# Patient Record
Sex: Male | Born: 1940
Health system: Southern US, Community
[De-identification: ages and names within clinical notes are randomized; demographics above are authoritative.]

## PROBLEM LIST (undated history)

## (undated) DIAGNOSIS — E079 Disorder of thyroid, unspecified: Secondary | ICD-10-CM

## (undated) DIAGNOSIS — K219 Gastro-esophageal reflux disease without esophagitis: Secondary | ICD-10-CM

## (undated) DIAGNOSIS — E538 Deficiency of other specified B group vitamins: Secondary | ICD-10-CM

## (undated) DIAGNOSIS — E7800 Pure hypercholesterolemia, unspecified: Secondary | ICD-10-CM

## (undated) DIAGNOSIS — E119 Type 2 diabetes mellitus without complications: Secondary | ICD-10-CM

## (undated) DIAGNOSIS — J45909 Unspecified asthma, uncomplicated: Secondary | ICD-10-CM

## (undated) DIAGNOSIS — N400 Enlarged prostate without lower urinary tract symptoms: Secondary | ICD-10-CM

## (undated) DIAGNOSIS — C61 Malignant neoplasm of prostate: Secondary | ICD-10-CM

## (undated) DIAGNOSIS — K922 Gastrointestinal hemorrhage, unspecified: Secondary | ICD-10-CM

## (undated) DIAGNOSIS — I4891 Unspecified atrial fibrillation: Secondary | ICD-10-CM

## (undated) DIAGNOSIS — K7460 Unspecified cirrhosis of liver: Secondary | ICD-10-CM

## (undated) DIAGNOSIS — Z87442 Personal history of urinary calculi: Secondary | ICD-10-CM

## (undated) DIAGNOSIS — M549 Dorsalgia, unspecified: Secondary | ICD-10-CM

## (undated) DIAGNOSIS — Z87448 Personal history of other diseases of urinary system: Secondary | ICD-10-CM

## (undated) DIAGNOSIS — N200 Calculus of kidney: Secondary | ICD-10-CM

## (undated) DIAGNOSIS — K259 Gastric ulcer, unspecified as acute or chronic, without hemorrhage or perforation: Secondary | ICD-10-CM

## (undated) DIAGNOSIS — M1990 Unspecified osteoarthritis, unspecified site: Secondary | ICD-10-CM

## (undated) DIAGNOSIS — I10 Essential (primary) hypertension: Secondary | ICD-10-CM

## (undated) HISTORY — PX: HERNIA REPAIR: SHX51

## (undated) HISTORY — DX: Calculus of kidney: N20.0

## (undated) HISTORY — DX: Benign prostatic hyperplasia without lower urinary tract symptoms: N40.0

## (undated) HISTORY — DX: Malignant neoplasm of prostate: C61

## (undated) HISTORY — DX: Gastric ulcer, unspecified as acute or chronic, without hemorrhage or perforation: K25.9

## (undated) HISTORY — DX: Gastro-esophageal reflux disease without esophagitis: K21.9

## (undated) HISTORY — DX: Dorsalgia, unspecified: M54.9

## (undated) HISTORY — DX: Personal history of other diseases of urinary system: Z87.448

## (undated) HISTORY — DX: Essential (primary) hypertension: I10

## (undated) HISTORY — DX: Pure hypercholesterolemia, unspecified: E78.00

## (undated) HISTORY — PX: OTHER SURGICAL HISTORY: SHX169

## (undated) HISTORY — DX: Unspecified cirrhosis of liver: K74.60

## (undated) HISTORY — DX: Type 2 diabetes mellitus without complications: E11.9

## (undated) HISTORY — DX: Disorder of thyroid, unspecified: E07.9

## (undated) HISTORY — DX: Unspecified osteoarthritis, unspecified site: M19.90

## (undated) HISTORY — PX: CIRCUMCISION: SUR203

---

## 1991-03-22 HISTORY — PX: OTHER SURGICAL HISTORY: SHX169

## 2004-01-15 ENCOUNTER — Ambulatory Visit: Admission: RE | Admit: 2004-01-15 | Discharge: 2004-04-14 | Payer: Self-pay | Admitting: Radiation Oncology

## 2004-05-06 ENCOUNTER — Ambulatory Visit: Payer: Self-pay | Admitting: Cardiology

## 2004-05-12 ENCOUNTER — Ambulatory Visit: Payer: Self-pay | Admitting: Cardiology

## 2004-06-09 ENCOUNTER — Ambulatory Visit: Payer: Self-pay | Admitting: Cardiology

## 2004-06-10 ENCOUNTER — Inpatient Hospital Stay (HOSPITAL_BASED_OUTPATIENT_CLINIC_OR_DEPARTMENT_OTHER): Admission: RE | Admit: 2004-06-10 | Discharge: 2004-06-10 | Payer: Self-pay | Admitting: Cardiology

## 2004-07-05 ENCOUNTER — Ambulatory Visit: Payer: Self-pay | Admitting: Cardiology

## 2004-07-06 ENCOUNTER — Ambulatory Visit: Payer: Self-pay | Admitting: Cardiology

## 2010-08-20 DIAGNOSIS — E7800 Pure hypercholesterolemia, unspecified: Secondary | ICD-10-CM | POA: Insufficient documentation

## 2010-08-20 DIAGNOSIS — E079 Disorder of thyroid, unspecified: Secondary | ICD-10-CM | POA: Insufficient documentation

## 2010-08-20 DIAGNOSIS — E119 Type 2 diabetes mellitus without complications: Secondary | ICD-10-CM | POA: Insufficient documentation

## 2010-08-20 DIAGNOSIS — IMO0002 Reserved for concepts with insufficient information to code with codable children: Secondary | ICD-10-CM | POA: Insufficient documentation

## 2010-08-20 DIAGNOSIS — E669 Obesity, unspecified: Secondary | ICD-10-CM | POA: Insufficient documentation

## 2012-01-17 DIAGNOSIS — C61 Malignant neoplasm of prostate: Secondary | ICD-10-CM | POA: Insufficient documentation

## 2012-01-17 DIAGNOSIS — N480 Leukoplakia of penis: Secondary | ICD-10-CM | POA: Insufficient documentation

## 2012-01-17 DIAGNOSIS — Z23 Encounter for immunization: Secondary | ICD-10-CM | POA: Insufficient documentation

## 2013-01-08 DIAGNOSIS — E119 Type 2 diabetes mellitus without complications: Secondary | ICD-10-CM

## 2013-02-01 DIAGNOSIS — J438 Other emphysema: Secondary | ICD-10-CM | POA: Insufficient documentation

## 2013-02-01 DIAGNOSIS — C61 Malignant neoplasm of prostate: Secondary | ICD-10-CM | POA: Insufficient documentation

## 2013-10-04 DIAGNOSIS — I10 Essential (primary) hypertension: Secondary | ICD-10-CM | POA: Diagnosis present

## 2013-10-04 DIAGNOSIS — E039 Hypothyroidism, unspecified: Secondary | ICD-10-CM | POA: Insufficient documentation

## 2013-10-04 DIAGNOSIS — E7800 Pure hypercholesterolemia, unspecified: Secondary | ICD-10-CM | POA: Insufficient documentation

## 2014-04-11 DIAGNOSIS — J449 Chronic obstructive pulmonary disease, unspecified: Secondary | ICD-10-CM | POA: Diagnosis not present

## 2014-04-23 DIAGNOSIS — N281 Cyst of kidney, acquired: Secondary | ICD-10-CM | POA: Diagnosis not present

## 2014-04-23 DIAGNOSIS — K439 Ventral hernia without obstruction or gangrene: Secondary | ICD-10-CM | POA: Diagnosis not present

## 2014-04-23 DIAGNOSIS — N200 Calculus of kidney: Secondary | ICD-10-CM | POA: Diagnosis not present

## 2014-04-23 DIAGNOSIS — R351 Nocturia: Secondary | ICD-10-CM | POA: Diagnosis not present

## 2014-04-23 DIAGNOSIS — R350 Frequency of micturition: Secondary | ICD-10-CM | POA: Diagnosis not present

## 2014-04-24 DIAGNOSIS — N3000 Acute cystitis without hematuria: Secondary | ICD-10-CM | POA: Insufficient documentation

## 2014-04-28 DIAGNOSIS — E780 Pure hypercholesterolemia: Secondary | ICD-10-CM | POA: Diagnosis not present

## 2014-04-28 DIAGNOSIS — E1165 Type 2 diabetes mellitus with hyperglycemia: Secondary | ICD-10-CM | POA: Diagnosis not present

## 2014-04-28 DIAGNOSIS — I10 Essential (primary) hypertension: Secondary | ICD-10-CM | POA: Diagnosis not present

## 2014-04-28 DIAGNOSIS — N3001 Acute cystitis with hematuria: Secondary | ICD-10-CM | POA: Diagnosis not present

## 2014-04-28 DIAGNOSIS — E039 Hypothyroidism, unspecified: Secondary | ICD-10-CM | POA: Diagnosis not present

## 2014-05-05 DIAGNOSIS — E039 Hypothyroidism, unspecified: Secondary | ICD-10-CM | POA: Diagnosis not present

## 2014-05-05 DIAGNOSIS — E1165 Type 2 diabetes mellitus with hyperglycemia: Secondary | ICD-10-CM | POA: Diagnosis not present

## 2014-05-05 DIAGNOSIS — I10 Essential (primary) hypertension: Secondary | ICD-10-CM | POA: Diagnosis not present

## 2014-05-05 DIAGNOSIS — Z1389 Encounter for screening for other disorder: Secondary | ICD-10-CM | POA: Diagnosis not present

## 2014-05-05 DIAGNOSIS — E780 Pure hypercholesterolemia: Secondary | ICD-10-CM | POA: Diagnosis not present

## 2014-05-12 ENCOUNTER — Other Ambulatory Visit (INDEPENDENT_AMBULATORY_CARE_PROVIDER_SITE_OTHER): Payer: Self-pay | Admitting: General Surgery

## 2014-05-12 DIAGNOSIS — K432 Incisional hernia without obstruction or gangrene: Secondary | ICD-10-CM | POA: Diagnosis not present

## 2014-05-12 DIAGNOSIS — K7460 Unspecified cirrhosis of liver: Secondary | ICD-10-CM

## 2014-05-12 DIAGNOSIS — J449 Chronic obstructive pulmonary disease, unspecified: Secondary | ICD-10-CM | POA: Diagnosis not present

## 2014-05-12 DIAGNOSIS — N135 Crossing vessel and stricture of ureter without hydronephrosis: Secondary | ICD-10-CM | POA: Diagnosis not present

## 2014-05-13 NOTE — Addendum Note (Signed)
Addended by: Stark Klein on: 05/13/2014 07:35 AM   Modules accepted: Orders

## 2014-05-16 ENCOUNTER — Ambulatory Visit (INDEPENDENT_AMBULATORY_CARE_PROVIDER_SITE_OTHER): Payer: Medicare Other | Admitting: Urology

## 2014-05-16 DIAGNOSIS — C61 Malignant neoplasm of prostate: Secondary | ICD-10-CM

## 2014-05-16 DIAGNOSIS — N471 Phimosis: Secondary | ICD-10-CM | POA: Diagnosis not present

## 2014-05-16 DIAGNOSIS — R350 Frequency of micturition: Secondary | ICD-10-CM | POA: Diagnosis not present

## 2014-05-16 DIAGNOSIS — N480 Leukoplakia of penis: Secondary | ICD-10-CM | POA: Diagnosis not present

## 2014-05-26 ENCOUNTER — Encounter (INDEPENDENT_AMBULATORY_CARE_PROVIDER_SITE_OTHER): Payer: Self-pay | Admitting: Internal Medicine

## 2014-05-26 ENCOUNTER — Ambulatory Visit (INDEPENDENT_AMBULATORY_CARE_PROVIDER_SITE_OTHER): Payer: Medicare Other | Admitting: Internal Medicine

## 2014-05-26 VITALS — BP 130/68 | HR 76 | Temp 99.0°F | Resp 18 | Ht 70.0 in | Wt 234.9 lb

## 2014-05-26 DIAGNOSIS — K7460 Unspecified cirrhosis of liver: Secondary | ICD-10-CM | POA: Diagnosis not present

## 2014-05-26 LAB — CBC
HEMATOCRIT: 29.8 % — AB (ref 39.0–52.0)
Hemoglobin: 9.3 g/dL — ABNORMAL LOW (ref 13.0–17.0)
MCH: 23.4 pg — ABNORMAL LOW (ref 26.0–34.0)
MCHC: 31.2 g/dL (ref 30.0–36.0)
MCV: 74.9 fL — ABNORMAL LOW (ref 78.0–100.0)
MPV: 10.7 fL (ref 8.6–12.4)
Platelets: 152 10*3/uL (ref 150–400)
RBC: 3.98 MIL/uL — ABNORMAL LOW (ref 4.22–5.81)
RDW: 16.4 % — ABNORMAL HIGH (ref 11.5–15.5)
WBC: 4 10*3/uL (ref 4.0–10.5)

## 2014-05-26 NOTE — Patient Instructions (Signed)
Physician will call with results of blood tests when completed. Upper endoscopy to be scheduled

## 2014-05-26 NOTE — Progress Notes (Signed)
Reason for consultation;  For evaluation of new diagnosis of cirrhosis.  History of present illness;  Patient is 74 year old Caucasian male who was referred through courtesy of Dr. Stark Klein for further evaluation of new diagnosis of cirrhosis. Patient is accompanied by his wife Inez Catalina. Patient's present illness started about 10 weeks ago when he started to experience right flank pain radiating posteriorly and also into his right lower extremity. He has history of urolithiasis. He therefore thought pain was due to recurrent kidney stones. He was seen by his urologist Dr. Coralee Rud and underwent abdominopelvic CT without contrast. This study revealed irregular contour to liver suspicious for cirrhosis, splenomegaly, anterior abdominal collaterals, multiple small ventral herniae containing fat as well as thickening and infiltrative process involving retroperitoneal fat but there was no evidence of urolithiasis or hydronephrosis. Patient was therefore referred to Dr. Barry Dienes for treatment of ventral herniae. Patient states right flank pain has nearly resolved and now intermittent and very mild. He is scheduled for MRI of abdomen to be performed in one week. Because of CT findings suggestive of cirrhosis she recommended further evaluation. Patient denies history of hepatitis or jaundice. There is no history of elevated transaminases in the past. Family history is negative for chronic liver disease. He has not had any alcohol in 20 years and before that he would drink occasionally. He complains of sporadic postprandial epigastric pain which resolved spontaneously after a few minutes. He states he had blood transfusion in 1991 when he presented with upper GI bleed and underwent surgery for bleeding ulcers. He denies nausea vomiting or heartburn. He has occasional difficulty swallowing water. Bowels move daily. He denies melena or rectal bleeding. He has never undergone screening colonoscopy but stool  guaiac by Dr. Quintin Alto is always negative. He denies dysuria or hematuria. He states his appetite is good and he is not sure why he has lost 24 pounds in one year. He is not able to do much physical activity on account of COPD. He uses O2 at night.   Current Medications: Outpatient Encounter Prescriptions as of 05/26/2014  Medication Sig  . aspirin EC 81 MG tablet Take 81 mg by mouth daily.  Marland Kitchen glipiZIDE (GLUCOTROL) 10 MG tablet Take 10 mg by mouth 2 (two) times daily before a meal.  . levothyroxine (SYNTHROID, LEVOTHROID) 150 MCG tablet Take 150 mcg by mouth daily before breakfast.  . lisinopril (PRINIVIL,ZESTRIL) 20 MG tablet Take 20 mg by mouth daily.  . metFORMIN (GLUCOPHAGE) 850 MG tablet Take 850 mg by mouth 3 (three) times daily with meals.  . simvastatin (ZOCOR) 40 MG tablet Take 20 mg by mouth at bedtime.   Past medical history;  He has been diabetic for 20 years. Hypertension of 15 years duration. History of kidney stones and he has passed three in the past. COPD. He is using O2 at night. Hyperlipidemia. Hypothyroidism diagnosed about 5 years ago. History of prostate carcinoma in 2011 treated with radiation therapy. Upper GI bleed secondary to peptic ulcer disease in 1991 requiring surgery. Fracture to right elbow and he underwent surgery 20 years ago. Circumcision 7 years ago.  Allergies; Allergies  Allergen Reactions  . Penicillins Itching    Patient states that it also caused pain in his sides.   Family history;  Father died of MI at age 67. Mother died of emphysema at age 34. He has 3 brothers and they're all diabetic. Of the 2 sisters living one is diabetic. There are sister died at age 56; no details  available.  Social history;  Patient is married(second marriage; first marriage ended in divorce). He has 3 grownup children in good health and 10 grandchildren. 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 He smokes cigarettes about a pack a day  for 20 years but quit in 1991. He used to drink alcohol occasionally but hasn't had any in 20 years.   Physical examination;  Blood pressure 130/68, pulse 76, temperature 99 F (37.2 C), temperature source Oral, resp. rate 18, height 5' 10"  (1.778 m), weight 234 lb 14.4 oz (106.55 kg). Patient is alert and in no acute distress. Conjunctiva is pink. Sclera is nonicteric Oropharyngeal mucosa is normal. He only has two teeth and lower jaw. No neck masses or thyromegaly noted. Cardiac exam with regular rhythm normal S1 and S2. No murmur or gallop noted. Lungs are clear to auscultation. Abdomen is folded upper midline scar and focal protrusion to the right of midline scar which is somewhat firm with positive cough impulse and minimally tender. Rest of the abdomen is soft without hepatosplenomegaly.  No LE edema or clubbing noted.  Labs/studies Results:  Lab data from Dr. Edythe Lynn office from 04/28/2014 TSH 0.665 Hemoglobin A1c is 9.3. Glucose 273, serum sodium 140, potassium 4.2, chloride 103, CO2 24, BUN 16 and creatinine 0.92 Serum calcium 9.0 Bilirubin 0.8, AP 118, AST 21, ALT 18, total protein 6.9 and albumin 4.2. Recent CBC is not available.  Abdominopelvic CT was reviewed with Dr. Lavonia Dana. Findings as above.    Assessment:  #1. Cirrhosis. Etiology is suspected to be NAFLD given long-standing diabetes mellitus. Other risks factors include remote blood transfusion. He appears to have compensated hepatic function. He needs to be screened for varices and HCC. Glycemic control appears to be suboptimal and he needs to work on on his diet. #2. Thickening to the retroperitoneal fat felt to be source of his right flank pain. This abnormality is to be further evaluated with MR abdomen. #3. Ventral herniae. One is easily palpable on exam and appears to contain omentum based on CT and has narrow neck. Dr. Barry Dienes has evaluated patient and has noted that he is high risk for surgery. #4.  Patient is average risk for CRC. He is not interested in screening colonoscopy but will think about it.  Recommendations;  Patient advised not to eat raw fish. Patient will go to the lab for CBC, INR, AFP, HbSAg, HCV antibody, serum iron, TIBC and serum ferritin. We will also check ANA and SMA. We will review MR abdomen to be done in one week. Will schedule patient for esophagogastroduodenoscopy after MR completed.  Office visit in 4 months.

## 2014-05-27 DIAGNOSIS — I10 Essential (primary) hypertension: Secondary | ICD-10-CM | POA: Insufficient documentation

## 2014-05-27 DIAGNOSIS — K7581 Nonalcoholic steatohepatitis (NASH): Secondary | ICD-10-CM | POA: Insufficient documentation

## 2014-05-27 DIAGNOSIS — E785 Hyperlipidemia, unspecified: Secondary | ICD-10-CM | POA: Insufficient documentation

## 2014-05-27 DIAGNOSIS — J449 Chronic obstructive pulmonary disease, unspecified: Secondary | ICD-10-CM | POA: Insufficient documentation

## 2014-05-27 DIAGNOSIS — E119 Type 2 diabetes mellitus without complications: Secondary | ICD-10-CM | POA: Insufficient documentation

## 2014-05-27 DIAGNOSIS — K7460 Unspecified cirrhosis of liver: Secondary | ICD-10-CM | POA: Insufficient documentation

## 2014-05-27 LAB — IRON AND TIBC
%SAT: 4 % — ABNORMAL LOW (ref 20–55)
Iron: 23 ug/dL — ABNORMAL LOW (ref 42–165)
TIBC: 527 ug/dL — AB (ref 215–435)
UIBC: 504 ug/dL — ABNORMAL HIGH (ref 125–400)

## 2014-05-27 LAB — HEPATITIS C ANTIBODY: HCV Ab: NEGATIVE

## 2014-05-27 LAB — PROTIME-INR
INR: 1.15 (ref <1.50)
PROTHROMBIN TIME: 14.7 s (ref 11.6–15.2)

## 2014-05-27 LAB — AFP TUMOR MARKER: AFP-Tumor Marker: 1.2 ng/mL (ref <6.1)

## 2014-05-27 LAB — ANTI-SMOOTH MUSCLE ANTIBODY, IGG: Smooth Muscle Ab: 8 U (ref <20)

## 2014-05-27 LAB — ANA: Anti Nuclear Antibody(ANA): NEGATIVE

## 2014-05-27 LAB — HEPATITIS B SURFACE ANTIGEN: Hepatitis B Surface Ag: NEGATIVE

## 2014-05-27 LAB — FERRITIN: Ferritin: 10 ng/mL — ABNORMAL LOW (ref 22–322)

## 2014-05-28 ENCOUNTER — Other Ambulatory Visit (INDEPENDENT_AMBULATORY_CARE_PROVIDER_SITE_OTHER): Payer: Self-pay | Admitting: *Deleted

## 2014-05-28 DIAGNOSIS — K7460 Unspecified cirrhosis of liver: Secondary | ICD-10-CM

## 2014-05-28 DIAGNOSIS — K7469 Other cirrhosis of liver: Secondary | ICD-10-CM

## 2014-06-02 ENCOUNTER — Ambulatory Visit
Admission: RE | Admit: 2014-06-02 | Discharge: 2014-06-02 | Disposition: A | Discharge status: Home / Self Care | Payer: Medicare Other | Source: Ambulatory Visit | Attending: General Surgery | Admitting: General Surgery

## 2014-06-02 DIAGNOSIS — R188 Other ascites: Secondary | ICD-10-CM | POA: Diagnosis not present

## 2014-06-02 DIAGNOSIS — Z8546 Personal history of malignant neoplasm of prostate: Secondary | ICD-10-CM | POA: Diagnosis not present

## 2014-06-02 MED ORDER — GADOXETATE DISODIUM 0.25 MMOL/ML IV SOLN
9.0000 mL | Freq: Once | INTRAVENOUS | Status: AC | PRN
  Administered 2014-06-02: 9 mL via INTRAVENOUS

## 2014-06-03 ENCOUNTER — Encounter (HOSPITAL_COMMUNITY)
Admission: RE | Disposition: A | Discharge status: Home / Self Care | Payer: Self-pay | Source: Ambulatory Visit | Attending: Internal Medicine

## 2014-06-03 ENCOUNTER — Ambulatory Visit (HOSPITAL_COMMUNITY)
Admission: RE | Admit: 2014-06-03 | Discharge: 2014-06-03 | Disposition: A | Discharge status: Home / Self Care | Payer: Medicare Other | Source: Ambulatory Visit | Attending: Internal Medicine | Admitting: Internal Medicine

## 2014-06-03 DIAGNOSIS — E119 Type 2 diabetes mellitus without complications: Secondary | ICD-10-CM | POA: Diagnosis not present

## 2014-06-03 DIAGNOSIS — E780 Pure hypercholesterolemia: Secondary | ICD-10-CM | POA: Insufficient documentation

## 2014-06-03 DIAGNOSIS — Z87891 Personal history of nicotine dependence: Secondary | ICD-10-CM | POA: Diagnosis not present

## 2014-06-03 DIAGNOSIS — Z980 Intestinal bypass and anastomosis status: Secondary | ICD-10-CM | POA: Insufficient documentation

## 2014-06-03 DIAGNOSIS — E079 Disorder of thyroid, unspecified: Secondary | ICD-10-CM | POA: Insufficient documentation

## 2014-06-03 DIAGNOSIS — Z87442 Personal history of urinary calculi: Secondary | ICD-10-CM | POA: Diagnosis not present

## 2014-06-03 DIAGNOSIS — I8500 Esophageal varices without bleeding: Secondary | ICD-10-CM | POA: Insufficient documentation

## 2014-06-03 DIAGNOSIS — Z880 Allergy status to penicillin: Secondary | ICD-10-CM | POA: Insufficient documentation

## 2014-06-03 DIAGNOSIS — K7460 Unspecified cirrhosis of liver: Secondary | ICD-10-CM | POA: Diagnosis not present

## 2014-06-03 DIAGNOSIS — Z8546 Personal history of malignant neoplasm of prostate: Secondary | ICD-10-CM | POA: Insufficient documentation

## 2014-06-03 DIAGNOSIS — M1990 Unspecified osteoarthritis, unspecified site: Secondary | ICD-10-CM | POA: Insufficient documentation

## 2014-06-03 DIAGNOSIS — K2950 Unspecified chronic gastritis without bleeding: Secondary | ICD-10-CM | POA: Diagnosis not present

## 2014-06-03 DIAGNOSIS — K7469 Other cirrhosis of liver: Secondary | ICD-10-CM | POA: Diagnosis not present

## 2014-06-03 DIAGNOSIS — K219 Gastro-esophageal reflux disease without esophagitis: Secondary | ICD-10-CM | POA: Insufficient documentation

## 2014-06-03 DIAGNOSIS — K3189 Other diseases of stomach and duodenum: Secondary | ICD-10-CM | POA: Diagnosis not present

## 2014-06-03 DIAGNOSIS — K766 Portal hypertension: Secondary | ICD-10-CM | POA: Insufficient documentation

## 2014-06-03 DIAGNOSIS — Z7982 Long term (current) use of aspirin: Secondary | ICD-10-CM | POA: Diagnosis not present

## 2014-06-03 DIAGNOSIS — D509 Iron deficiency anemia, unspecified: Secondary | ICD-10-CM | POA: Insufficient documentation

## 2014-06-03 DIAGNOSIS — K6389 Other specified diseases of intestine: Secondary | ICD-10-CM | POA: Diagnosis not present

## 2014-06-03 HISTORY — PX: ESOPHAGOGASTRODUODENOSCOPY: SHX5428

## 2014-06-03 LAB — GLUCOSE, CAPILLARY: GLUCOSE-CAPILLARY: 187 mg/dL — AB (ref 70–99)

## 2014-06-03 SURGERY — EGD (ESOPHAGOGASTRODUODENOSCOPY)
Anesthesia: Moderate Sedation

## 2014-06-03 MED ORDER — MEPERIDINE HCL 50 MG/ML IJ SOLN
INTRAMUSCULAR | Status: AC
  Filled 2014-06-03: qty 1

## 2014-06-03 MED ORDER — MIDAZOLAM HCL 5 MG/5ML IJ SOLN
INTRAMUSCULAR | Status: AC
  Filled 2014-06-03: qty 10

## 2014-06-03 MED ORDER — SIMETHICONE 40 MG/0.6ML PO SUSP
ORAL | Status: DC | PRN
  Administered 2014-06-03: 08:00:00

## 2014-06-03 MED ORDER — SODIUM CHLORIDE 0.9 % IV SOLN
INTRAVENOUS | Status: DC
  Administered 2014-06-03: 1000 mL via INTRAVENOUS

## 2014-06-03 MED ORDER — MIDAZOLAM HCL 5 MG/5ML IJ SOLN
INTRAMUSCULAR | Status: DC | PRN
  Administered 2014-06-03 (×3): 1 mg via INTRAVENOUS

## 2014-06-03 MED ORDER — BUTAMBEN-TETRACAINE-BENZOCAINE 2-2-14 % EX AERO
INHALATION_SPRAY | CUTANEOUS | Status: DC | PRN
  Administered 2014-06-03: 2 via TOPICAL

## 2014-06-03 MED ORDER — MEPERIDINE HCL 50 MG/ML IJ SOLN
INTRAMUSCULAR | Status: DC | PRN
  Administered 2014-06-03 (×2): 25 mg via INTRAVENOUS

## 2014-06-03 NOTE — Discharge Instructions (Signed)
Resume aspirin on 06/05/2014. Resume other medications as before. Resume usual diet. Physician will call with results of blood test(ACE level) and biopsy. No driving for 24 hours.  Esophagogastroduodenoscopy Care After Refer to this sheet in the next few weeks. These instructions provide you with information on caring for yourself after your procedure. Your caregiver may also give you more specific instructions. Your treatment has been planned according to current medical practices, but problems sometimes occur. Call your caregiver if you have any problems or questions after your procedure.  HOME CARE INSTRUCTIONS  Do not eat or drink anything until the numbing medicine (local anesthetic) has worn off and your gag reflex has returned. You will know that the local anesthetic has worn off when you can swallow comfortably.  Do not drive for 12 hours after the procedure or as directed by your caregiver.  Only take medicines as directed by your caregiver. SEEK MEDICAL CARE IF:   You cannot stop coughing.  You are not urinating at all or less than usual. SEEK IMMEDIATE MEDICAL CARE IF:  You have difficulty swallowing.  You cannot eat or drink.  You have worsening throat or chest pain.  You have dizziness, lightheadedness, or you faint.  You have nausea or vomiting.  You have chills.  You have a fever.  You have severe abdominal pain.  You have black, tarry, or bloody stools. Document Released: 02/22/2012 Document Reviewed: 02/22/2012 [facility] Patient Information 2015 [place]. This information is not intended to replace advice given to you by your health care provider. Make sure you discuss any questions you have with your health care provider.

## 2014-06-03 NOTE — H&P (Signed)
Tyler Farmer is an 74 y.o. male.   Chief Complaint: Patient is here for EGD. HPI: Patient is 74 year old Caucasian male who was recently evaluated for right-sided abdominal pain and found to have ventral hernia as well as irregular contorted to the liver suspicious for cirrhosis splenomegaly abdominal collaterals he also had thickening to retroperitoneal fat with few lymph nodes. Patient was therefore referred for further evaluation of cirrhosis. Biochemical markers were negative and he was found to have iron deficiency anemia. Erosive felt to be secondary to NAFLD given long-standing diabetes. He had MR abdomen yesterday revealing dominant retroperitoneal lymph nodes as well as infiltration of the mesenteric fat raising possibility of retroperitoneal fibrosis lymphoma and other conditions. Patient is undergoing EGD looking for source of 5 and deficiency anemia and also for varices. She has declined to undergo colonoscopy. There is no history of melena or rectal bleeding.   Past Medical History  Diagnosis Date  . Arthritis     Hands/Fingers  . Back pain   . H/O bladder problems   . Diabetes mellitus   . Enlarged prostate   . Gastric ulcer   . GERD (gastroesophageal reflux disease)   . Hypertension   . Hypercholesteremia   . Kidney stone   . Prostate cancer   . Thyroid disease     Past Surgical History  Procedure Laterality Date  . Gastric ulcer  1993  . Right elbow Right     A pin was put in  . Circumcision      at age 47    Family History  Problem Relation Age of Onset  . Emphysema Mother   . Cerebrovascular Accident Father   . Heart disease Father   . Diabetes Sister   . Heart disease Brother   . Heart disease Sister   . Heart disease Brother   . Diabetes Brother    Social History:  reports that he quit smoking about 25 years ago. He has never used smokeless tobacco. He reports that he does not drink alcohol. His drug history is not on file.  Allergies:   Allergies  Allergen Reactions  . Penicillins Itching    Patient states that it also caused pain in his sides.    Medications Prior to Admission  Medication Sig Dispense Refill  . aspirin EC 81 MG tablet Take 81 mg by mouth daily.    . ferrous sulfate 325 (65 FE) MG tablet Take 325 mg by mouth 2 (two) times daily with a meal.    . glipiZIDE (GLUCOTROL) 10 MG tablet Take 10 mg by mouth 2 (two) times daily before a meal.    . levothyroxine (SYNTHROID, LEVOTHROID) 150 MCG tablet Take 150 mcg by mouth daily before breakfast.    . lisinopril (PRINIVIL,ZESTRIL) 20 MG tablet Take 20 mg by mouth daily.    . metFORMIN (GLUCOPHAGE) 850 MG tablet Take 850 mg by mouth 3 (three) times daily with meals.    . simvastatin (ZOCOR) 40 MG tablet Take 20 mg by mouth at bedtime.      Results for orders placed or performed during the hospital encounter of 06/03/14 (from the past 48 hour(s))  Glucose, capillary     Status: Abnormal   Collection Time: 06/03/14  7:19 AM  Result Value Ref Range   Glucose-Capillary 187 (H) 70 - 99 mg/dL   [name]  06/02/2014   CLINICAL DATA:  Right-sided abdominal pain for 10 weeks. History of prostate cancer. Cirrhosis.  EXAM: MRI  ABDOMEN WITHOUT AND WITH CONTRAST  TECHNIQUE: Multiplanar multisequence MR imaging of the abdomen was performed both before and after the administration of intravenous contrast.  CONTRAST:  9 cc Eovist IV contrast.  BUN and creatinine were obtained on site at [facility] at [address] Results: BUN 9 mg/dL, Creatinine 0.8 mg/dL.  COMPARISON:  Noncontrast CT 04/23/2014  FINDINGS: Lower chest:  Lung bases are clear.  Hepatobiliary: Mild motion degraded exam. Mildly nodular hepatic contour. Subjective prominence of the portal vein reidentified. No intrahepatic ductal dilatation. Mild central periportal edema is noted. Gallbladder is unremarkable.  Pancreas: Pancreas is unremarkable.  Spleen: Spleen appears normal.   Adrenals/Urinary Tract: Adrenal glands are normal. Bilateral renal cortical cysts are identified. No hydronephrosis.  Stomach/Bowel: Visualized bowel is unremarkable.  Vascular/Lymphatic: Diffusely increased T2 signal is identified within retroperitoneal perivascular fat with internal abnormal appearing lymph nodes measuring 1.1 cm in maximal short axis diameter image 37 series 15. Multiple mildly enlarged lymph nodes are identified within the root of small bowel mesentery, representative node measuring 0.9 cm short axis diameter image 34 series 15. Mild infiltration of the mesenteric fat is also identified.  Retro aortic left renal vein reidentified.  Other: Small fat containing anterior abdominal wall hernias are reidentified. Trace abdominal ascites is present.  Musculoskeletal: Visualized osseous structures are unremarkable allowing for technique.  Allowing for motion artifact, after administration of contrast, no abnormal enhancing lesion is identified. Please note sensitivity and specificity for detection of disease is significantly reduced by motion on postcontrast series.  IMPRESSION: Allowing for motion artifact, no abnormal hepatic enhancement to suggest hepatoma given the reported history of cirrhosis.  Trace ascites, with trace fluid within the root of small bowel mesentery and within the retroperitoneal fat. Abnormal appearing mesenteric and retroperitoneal lymph nodes. The most likely etiologies would include retroperitoneal fibrosis or lymphoma. Image guided biopsy may be helpful for further differentiation.   Electronically Signed   By: Conchita Paris M.D.   On: 06/02/2014 17:32    ROS  Blood pressure 150/78, pulse 82, temperature 98.3 F (36.8 C), temperature source Oral, resp. rate 20, SpO2 95 %. Physical Exam  Constitutional: He appears well-developed and well-nourished.  HENT:  Mouth/Throat: Oropharynx is clear and moist.  Eyes: Conjunctivae are normal. No scleral icterus.  Neck: No  thyromegaly present.  Cardiovascular: Normal rate, regular rhythm and normal heart sounds.   No murmur heard. Respiratory: Effort normal and breath sounds normal.  GI:  Protuberant abdomen with vertical scar across upper abdomen. Bulge noted to the right of scar which is firm and slightly tender. No hepato-or splenomegaly.  Musculoskeletal: He exhibits no edema.  Lymphadenopathy:    He has no cervical adenopathy.  Neurological: He is alert.  Skin: Skin is warm and dry.     Assessment/Plan Iron deficiency anemia. New diagnosis of cirrhosis. Diagnostic EGD. If he has large esophageal varices disease will be banded.  REHMAN,NAJEEB U 06/03/2014, 7:37 AM

## 2014-06-03 NOTE — Op Note (Signed)
EGD PROCEDURE REPORT  PATIENT:  Tyler Farmer  MR#:  [id number] Birthdate:  22-May-1940, 74 y.o., male Endoscopist:  Dr. Rogene Houston, MD Referred By:  Dr. Stark Klein, MD Procedure Date: 06/03/2014  Procedure:   EGD  Indications:  Patient is 74 year old Caucasian male with multiple medical problems who was also found to have cirrhosis when he had abdominopelvic CT because of right-sided abdominal pain. Biochemical markers for hepatitis B hepatitis C as well as autoimmune markers are negative he was found to have iron deficiency anemia. He is undergoing EGD looking for esophageal varices and also for source of iron deficiency anemia. He has declined to undergo colonoscopy for now. History significant for peptic ulcer disease for which he had surgery in 90s. He was also found to have retroperitoneal infiltrative process in adenopathy and is to follow-up with Dr. Barry Dienes.            Informed Consent:  The risks, benefits, alternatives & imponderables which include, but are not limited to, bleeding, infection, perforation, drug reaction and potential missed lesion have been reviewed.  The potential for biopsy, lesion removal, esophageal dilation, etc. have also been discussed.  Questions have been answered.  All parties agreeable.  Please see history & physical in medical record for more information.  Medications:  Demerol 50 mg IV Versed 3 mg IV Cetacaine spray topically for oropharyngeal anesthesia  Description of procedure:  The endoscope was introduced through the mouth and advanced to the second portion of the duodenum without difficulty or limitations. The mucosal surfaces were surveyed very carefully during advancement of the scope and upon withdrawal.  Findings:  Esophagus:  Mucosa of the proximal and middle third was normal. Distally he had single column of varix which was grade I/II. GEJ:  44 cm Stomach:  There was small amount of bile in the stomach but no food debris was  present. Large gastric pouch with patent gastrojejunostomy. There was edema and patchy erythema to mucosa at gastric fundus and body. No fundal varices noted. Small bowel:  Afferent loop was examined to the blunt and and revealed normal mucosa. Efferent loop was examined for 30 cm and was normal other than mucosal edema.  Therapeutic/Diagnostic Maneuvers Performed:   Biopsy taken from gastric mucosa proximal to anastomosis for CLO test. Multiple biopsies also taken from jejunal mucosa for routine histology.  Complications:  None  Impression: Single column of grade I/II esophageal varix without evidence of gastric varices. Portal gastropathy. Patent Billroth II anastomosis. Edema to jejunal mucosa most likely due to underlying cirrhosis/portal hypertension. Gastric biopsy taken for CLO test and jejunal biopsy for routine histology.  Recommendations:  Resume aspirin in two days. Will check ACE level today regarding the retroperitoneal findings. CBC and Hemoccult in 4 weeks. Follow-up with Dr. Barry Dienes as planned.  REHMAN,NAJEEB U  06/03/2014  8:13 AM  CC: Dr. [name], MD & Dr. Rayne Du ref. provider found CC: Dr. Stark Klein, MD

## 2014-06-04 LAB — CLOTEST (H. PYLORI), BIOPSY: Helicobacter screen: NEGATIVE

## 2014-06-04 LAB — ANGIOTENSIN CONVERTING ENZYME: Angiotensin-Converting Enzyme: <14 U/L — ABNORMAL LOW (ref 14–82)

## 2014-06-05 ENCOUNTER — Encounter (HOSPITAL_COMMUNITY): Payer: Self-pay | Admitting: Internal Medicine

## 2014-06-06 ENCOUNTER — Other Ambulatory Visit: Payer: Self-pay | Admitting: General Surgery

## 2014-06-06 DIAGNOSIS — K7460 Unspecified cirrhosis of liver: Secondary | ICD-10-CM

## 2014-06-06 NOTE — Addendum Note (Signed)
Addended by: Stark Klein on: 06/06/2014 11:00 AM   Modules accepted: Orders

## 2014-06-09 ENCOUNTER — Other Ambulatory Visit: Payer: Self-pay | Admitting: *Deleted

## 2014-06-09 DIAGNOSIS — R590 Localized enlarged lymph nodes: Secondary | ICD-10-CM

## 2014-06-10 ENCOUNTER — Telehealth (INDEPENDENT_AMBULATORY_CARE_PROVIDER_SITE_OTHER): Payer: Self-pay | Admitting: *Deleted

## 2014-06-10 DIAGNOSIS — K7469 Other cirrhosis of liver: Secondary | ICD-10-CM

## 2014-06-10 DIAGNOSIS — J449 Chronic obstructive pulmonary disease, unspecified: Secondary | ICD-10-CM | POA: Diagnosis not present

## 2014-06-10 NOTE — Telephone Encounter (Signed)
Per Dr.Rehman the patient will need to have labs drawn in 1 month. 

## 2014-06-11 ENCOUNTER — Encounter (INDEPENDENT_AMBULATORY_CARE_PROVIDER_SITE_OTHER): Payer: Self-pay

## 2014-06-13 ENCOUNTER — Other Ambulatory Visit: Payer: Self-pay | Admitting: Radiology

## 2014-06-17 ENCOUNTER — Other Ambulatory Visit: Payer: Self-pay | Admitting: Radiology

## 2014-06-18 ENCOUNTER — Ambulatory Visit (HOSPITAL_COMMUNITY)
Admission: RE | Admit: 2014-06-18 | Discharge: 2014-06-18 | Disposition: A | Discharge status: Home / Self Care | Payer: Medicare Other | Source: Ambulatory Visit | Attending: General Surgery | Admitting: General Surgery

## 2014-06-18 ENCOUNTER — Encounter (HOSPITAL_COMMUNITY): Payer: Self-pay

## 2014-06-18 ENCOUNTER — Encounter (INDEPENDENT_AMBULATORY_CARE_PROVIDER_SITE_OTHER): Payer: Self-pay | Admitting: *Deleted

## 2014-06-18 ENCOUNTER — Other Ambulatory Visit (INDEPENDENT_AMBULATORY_CARE_PROVIDER_SITE_OTHER): Payer: Self-pay | Admitting: *Deleted

## 2014-06-18 VITALS — BP 122/61 | HR 82 | Temp 98.0°F | Resp 12 | Ht 69.0 in | Wt 234.0 lb

## 2014-06-18 DIAGNOSIS — K7460 Unspecified cirrhosis of liver: Secondary | ICD-10-CM | POA: Diagnosis not present

## 2014-06-18 DIAGNOSIS — K753 Granulomatous hepatitis, not elsewhere classified: Secondary | ICD-10-CM | POA: Diagnosis not present

## 2014-06-18 DIAGNOSIS — R590 Localized enlarged lymph nodes: Secondary | ICD-10-CM

## 2014-06-18 DIAGNOSIS — R109 Unspecified abdominal pain: Secondary | ICD-10-CM | POA: Insufficient documentation

## 2014-06-18 DIAGNOSIS — K7469 Other cirrhosis of liver: Secondary | ICD-10-CM

## 2014-06-18 LAB — GLUCOSE, CAPILLARY
GLUCOSE-CAPILLARY: 193 mg/dL — AB (ref 70–99)
Glucose-Capillary: 207 mg/dL — ABNORMAL HIGH (ref 70–99)

## 2014-06-18 LAB — CBC
HEMATOCRIT: 34 % — AB (ref 39.0–52.0)
Hemoglobin: 10.2 g/dL — ABNORMAL LOW (ref 13.0–17.0)
MCH: 24 pg — AB (ref 26.0–34.0)
MCHC: 30 g/dL (ref 30.0–36.0)
MCV: 80 fL (ref 78.0–100.0)
Platelets: 152 10*3/uL (ref 150–400)
RBC: 4.25 MIL/uL (ref 4.22–5.81)
RDW: 19.2 % — ABNORMAL HIGH (ref 11.5–15.5)
WBC: 4.1 10*3/uL (ref 4.0–10.5)

## 2014-06-18 LAB — APTT: APTT: 32 s (ref 24–37)

## 2014-06-18 LAB — PROTIME-INR
INR: 1.09 (ref 0.00–1.49)
PROTHROMBIN TIME: 14.2 s (ref 11.6–15.2)

## 2014-06-18 MED ORDER — LIDOCAINE HCL (PF) 1 % IJ SOLN
INTRAMUSCULAR | Status: AC
  Filled 2014-06-18: qty 10

## 2014-06-18 MED ORDER — MIDAZOLAM HCL 2 MG/2ML IJ SOLN
INTRAMUSCULAR | Status: AC
  Filled 2014-06-18: qty 2

## 2014-06-18 MED ORDER — FENTANYL CITRATE 0.05 MG/ML IJ SOLN
INTRAMUSCULAR | Status: AC | PRN
  Administered 2014-06-18 (×2): 50 ug via INTRAVENOUS

## 2014-06-18 MED ORDER — SODIUM CHLORIDE 0.9 % IV SOLN: Freq: Once | INTRAVENOUS | Status: DC

## 2014-06-18 MED ORDER — FENTANYL CITRATE 0.05 MG/ML IJ SOLN
INTRAMUSCULAR | Status: AC
  Filled 2014-06-18: qty 2

## 2014-06-18 MED ORDER — MIDAZOLAM HCL 2 MG/2ML IJ SOLN
INTRAMUSCULAR | Status: AC | PRN
  Administered 2014-06-18: 1 mg via INTRAVENOUS

## 2014-06-18 MED ORDER — OXYCODONE HCL 5 MG PO TABS: 5.0000 mg | ORAL_TABLET | ORAL | Status: DC | PRN

## 2014-06-18 NOTE — Procedures (Signed)
US guided liver biopsy.  3 cores obtained.  Minimal blood loss.  No immediate complication.

## 2014-06-18 NOTE — H&P (Signed)
Chief Complaint: New dx cirrhosis  Referring Physician(s): Byerly,Faera  History of Present Illness: Tyler Farmer is a 74 y.o. male   Pt developed RUQ pain early 2016 Work up revealed ventral hernia and incidental unusual contour of liver consistent with cirrhosis Dr Barry Dienes has seen pt regarding ventral hernia and referred to Dr Dereck Leep for evaluation of new cirrhosis. No ETOH since 1990 Hep B and C neg Automimmune markers neg Now scheduled for liver core bx  Past Medical History  Diagnosis Date  . Arthritis     Hands/Fingers  . Back pain   . H/O bladder problems   . Diabetes mellitus   . Enlarged prostate   . Gastric ulcer   . GERD (gastroesophageal reflux disease)   . Hypertension   . Hypercholesteremia   . Kidney stone   . Prostate cancer   . Thyroid disease     Past Surgical History  Procedure Laterality Date  . Gastric ulcer  1993  . Right elbow Right     A pin was put in  . Circumcision      at age 12  . Esophagogastroduodenoscopy N/A 06/03/2014    Procedure: ESOPHAGOGASTRODUODENOSCOPY (EGD);  Surgeon: Rogene Houston, MD;  Location: [facility];  Service: Endoscopy;  Laterality: N/A;  730    Allergies: Penicillins  Medications: Prior to Admission medications   Medication Sig Start Date End Date Taking? Authorizing Provider  aspirin EC 81 MG tablet Take 81 mg by mouth daily.   Yes Historical Provider, MD  ferrous sulfate 325 (65 FE) MG tablet Take 325 mg by mouth 2 (two) times daily with a meal.   Yes Historical Provider, MD  glipiZIDE (GLUCOTROL) 10 MG tablet Take 10 mg by mouth 2 (two) times daily before a meal.   Yes Historical Provider, MD  levothyroxine (SYNTHROID, LEVOTHROID) 150 MCG tablet Take 150 mcg by mouth daily before breakfast.   Yes Historical Provider, MD  lisinopril (PRINIVIL,ZESTRIL) 20 MG tablet Take 20 mg by mouth daily.   Yes Historical Provider, MD  metFORMIN (GLUCOPHAGE) 850 MG tablet Take 850 mg by mouth 3 (three) times  daily with meals.   Yes Historical Provider, MD  simvastatin (ZOCOR) 40 MG tablet Take 20 mg by mouth at bedtime.   Yes Historical Provider, MD     Family History  Problem Relation Age of Onset  . Emphysema Mother   . Cerebrovascular Accident Father   . Heart disease Father   . Diabetes Sister   . Heart disease Brother   . Heart disease Sister   . Heart disease Brother   . Diabetes Brother     History   Social History  . Marital Status: Married    Spouse Name: N/A  . Number of Children: N/A  . Years of Education: N/A   Social History Main Topics  . Smoking status: Former Smoker    Quit date: 05/25/1989  . Smokeless tobacco: Never Used  . Alcohol Use: No     Comment: Drank many years - 30 years ago  . Drug Use: Not on file  . Sexual Activity: Not on file   Other Topics Concern  . None   Social History Narrative     Review of Systems: A 12 point ROS discussed and pertinent positives are indicated in the HPI above.  All other systems are negative.  Review of Systems  Constitutional: Positive for unexpected weight change. Negative for fever, activity change, appetite change and fatigue.  Respiratory: Negative for cough and shortness of breath.   Cardiovascular: Negative for chest pain.  Gastrointestinal: Positive for nausea, abdominal pain and abdominal distention. Negative for vomiting.  Musculoskeletal: Negative for back pain.  Neurological: Negative for weakness.  Psychiatric/Behavioral: Negative for behavioral problems and confusion.    Vital Signs: BP 156/64 mmHg  Pulse 85  Temp(Src) 98 F (36.7 C) (Oral)  Resp 18  Ht 5\' 9"  (1.753 m)  Wt 106.142 kg (234 lb)  BMI 34.54 kg/m2  SpO2 98%  Physical Exam  Constitutional: He is oriented to person, place, and time.  Cardiovascular: Normal rate, regular rhythm and normal heart sounds.   No murmur heard. Pulmonary/Chest: Effort normal and breath sounds normal. He has no wheezes.  Abdominal: Soft. Bowel  sounds are normal. He exhibits distension. There is tenderness.  Musculoskeletal: Normal range of motion.  Neurological: He is alert and oriented to person, place, and time.  Skin: Skin is warm and dry.  Psychiatric: He has a normal mood and affect. His behavior is normal. Judgment and thought content normal.  Nursing note and vitals reviewed.   Mallampati Score:  MD Evaluation Airway: WNL Heart: WNL Abdomen: WNL Chest/ Lungs: WNL ASA  Classification: 3 Mallampati/Airway Score: Two  Imaging: [name]  06/02/2014   CLINICAL DATA:  Right-sided abdominal pain for 10 weeks. History of prostate cancer. Cirrhosis.  EXAM: MRI ABDOMEN WITHOUT AND WITH CONTRAST  TECHNIQUE: Multiplanar multisequence MR imaging of the abdomen was performed both before and after the administration of intravenous contrast.  CONTRAST:  9 cc Eovist IV contrast.  BUN and creatinine were obtained on site at [place] at [address] Results: BUN 9 mg/dL, Creatinine 0.8 mg/dL.  COMPARISON:  Noncontrast CT 04/23/2014  FINDINGS: Lower chest:  Lung bases are clear.  Hepatobiliary: Mild motion degraded exam. Mildly nodular hepatic contour. Subjective prominence of the portal vein reidentified. No intrahepatic ductal dilatation. Mild central periportal edema is noted. Gallbladder is unremarkable.  Pancreas: Pancreas is unremarkable.  Spleen: Spleen appears normal.  Adrenals/Urinary Tract: Adrenal glands are normal. Bilateral renal cortical cysts are identified. No hydronephrosis.  Stomach/Bowel: Visualized bowel is unremarkable.  Vascular/Lymphatic: Diffusely increased T2 signal is identified within retroperitoneal perivascular fat with internal abnormal appearing lymph nodes measuring 1.1 cm in maximal short axis diameter image 37 series 15. Multiple mildly enlarged lymph nodes are identified within the root of small bowel mesentery, representative node measuring 0.9 cm short axis diameter image 34  series 15. Mild infiltration of the mesenteric fat is also identified.  Retro aortic left renal vein reidentified.  Other: Small fat containing anterior abdominal wall hernias are reidentified. Trace abdominal ascites is present.  Musculoskeletal: Visualized osseous structures are unremarkable allowing for technique.  Allowing for motion artifact, after administration of contrast, no abnormal enhancing lesion is identified. Please note sensitivity and specificity for detection of disease is significantly reduced by motion on postcontrast series.  IMPRESSION: Allowing for motion artifact, no abnormal hepatic enhancement to suggest hepatoma given the reported history of cirrhosis.  Trace ascites, with trace fluid within the root of small bowel mesentery and within the retroperitoneal fat. Abnormal appearing mesenteric and retroperitoneal lymph nodes. The most likely etiologies would include retroperitoneal fibrosis or lymphoma. Image guided biopsy may be helpful for further differentiation.   Electronically Signed   By: Conchita Paris M.D.   On: 06/02/2014 17:32    Labs:  CBC:  Recent Labs  05/26/14 1055  WBC 4.0  HGB 9.3*  HCT 29.8*  PLT 152    COAGS:  Recent Labs  05/26/14 1133  INR 1.15    BMP: No results for input(s): NA, K, CL, CO2, GLUCOSE, BUN, CALCIUM, CREATININE, GFRNONAA, GFRAA in the last 8760 hours.  Invalid input(s): CMP  LIVER FUNCTION TESTS: No results for input(s): BILITOT, AST, ALT, ALKPHOS, PROT, ALBUMIN in the last 8760 hours.  TUMOR MARKERS:  Recent Labs  05/26/14 1133  AFPTM 1.2    Assessment and Plan:  New dx cirrhosis MRI 06/02/14 reveals fibrosis Now scheduled for liver core biopsy Risks and Benefits discussed with the patient including, but not limited to bleeding, infection, damage to adjacent structures or low yield requiring additional tests. All of the patient's questions were answered, patient is agreeable to proceed. Consent signed and in  chart.   Thank you for this interesting consult.  I greatly enjoyed meeting Tyler Farmer and look forward to participating in their care.  Signed: Jamila Slatten A 06/18/2014, 9:17 AM   I spent a total of  20 Minutes   in face to face in clinical consultation, greater than 50% of which was counseling/coordinating care for liver core bx

## 2014-06-18 NOTE — Discharge Instructions (Signed)
Liver Biopsy, Care After °These instructions give you information on caring for yourself after your procedure. Your doctor may also give you more specific instructions. Call your doctor if you have any problems or questions after your procedure. °HOME CARE °· Rest at home for 1-2 days or as told by your doctor. °· Have someone stay with you for at least 24 hours. °· Do not do these things in the first 24 hours: °¨ Drive. °¨ Use machinery. °¨ Take care of other people. °¨ Sign legal documents. °¨ Take a bath or shower. °· There are many different ways to close and cover a cut (incision). For example, a cut can be closed with stitches, skin glue, or adhesive strips. Follow your doctor's instructions on: °¨ Taking care of your cut. °¨ Changing and removing your bandage (dressing). °¨ Removing whatever was used to close your cut. °· Do not drink alcohol in the first week. °· Do not lift more than 5 pounds or play contact sports for the first 2 weeks. °· Take medicines only as told by your doctor. For 1 week, do not take medicine that has aspirin in it or medicines like ibuprofen. °· Get your test results. °GET HELP IF: °· A cut bleeds and leaves more than just a small spot of blood. °· A cut is red, puffs up (swells), or hurts more than before. °· Fluid or something else comes from a cut. °· A cut smells bad. °· You have a fever or chills. °GET HELP RIGHT AWAY IF: °· You have swelling, bloating, or pain in your belly (abdomen). °· You get dizzy or faint. °· You have a rash. °· You feel sick to your stomach (nauseous) or throw up (vomit). °· You have trouble breathing, feel short of breath, or feel faint. °· Your chest hurts. °· You have problems talking or seeing. °· You have trouble balancing or moving your arms or legs. °Document Released: 12/15/2007 Document Revised: 07/22/2013 Document Reviewed: 05/03/2013 °Exit[employer]® Patient Information ©2015 [employer]. This information is not intended to replace advice given to  you by your health care provider. Make sure you discuss any questions you have with your health care provider. ° °

## 2014-06-26 ENCOUNTER — Telehealth (INDEPENDENT_AMBULATORY_CARE_PROVIDER_SITE_OTHER): Payer: Self-pay | Admitting: *Deleted

## 2014-06-26 NOTE — Telephone Encounter (Signed)
Patient will be called 06/27/14. Per Dr.Rehman the patient is to have H&H , his CBC is noted for 07/11/14. Patient was sent a letter by me for this lab.

## 2014-06-26 NOTE — Telephone Encounter (Signed)
Tyler Farmer is calling his PCP wanting his test results. Please call [place] at [phone].

## 2014-06-27 NOTE — Telephone Encounter (Signed)
Patient called and a message was left on voicemail. All test have been reviewed with patient per Dr.Rehman. We are awiaiting him to have his H&H drawn, this is due 07/11/14.

## 2014-07-11 DIAGNOSIS — J449 Chronic obstructive pulmonary disease, unspecified: Secondary | ICD-10-CM | POA: Diagnosis not present

## 2014-07-11 DIAGNOSIS — K7460 Unspecified cirrhosis of liver: Secondary | ICD-10-CM | POA: Diagnosis not present

## 2014-07-11 DIAGNOSIS — K7469 Other cirrhosis of liver: Secondary | ICD-10-CM | POA: Diagnosis not present

## 2014-07-12 LAB — CBC
HCT: 34.7 % — ABNORMAL LOW (ref 39.0–52.0)
HEMOGLOBIN: 10.8 g/dL — AB (ref 13.0–17.0)
MCH: 25.4 pg — ABNORMAL LOW (ref 26.0–34.0)
MCHC: 31.1 g/dL (ref 30.0–36.0)
MCV: 81.5 fL (ref 78.0–100.0)
PLATELETS: 129 10*3/uL — AB (ref 150–400)
RBC: 4.26 MIL/uL (ref 4.22–5.81)
RDW: 21.1 % — ABNORMAL HIGH (ref 11.5–15.5)
WBC: 3.9 10*3/uL — ABNORMAL LOW (ref 4.0–10.5)

## 2014-07-14 ENCOUNTER — Telehealth (INDEPENDENT_AMBULATORY_CARE_PROVIDER_SITE_OTHER): Payer: Self-pay | Admitting: *Deleted

## 2014-07-14 DIAGNOSIS — K7469 Other cirrhosis of liver: Secondary | ICD-10-CM

## 2014-07-14 NOTE — Telephone Encounter (Signed)
Per Dr.Rehman the patient will need to have labs drawn in 1 month. 

## 2014-07-16 ENCOUNTER — Encounter (INDEPENDENT_AMBULATORY_CARE_PROVIDER_SITE_OTHER): Payer: Self-pay | Admitting: *Deleted

## 2014-07-16 ENCOUNTER — Other Ambulatory Visit (INDEPENDENT_AMBULATORY_CARE_PROVIDER_SITE_OTHER): Payer: Self-pay | Admitting: *Deleted

## 2014-07-16 DIAGNOSIS — K7469 Other cirrhosis of liver: Secondary | ICD-10-CM

## 2014-08-10 DIAGNOSIS — J449 Chronic obstructive pulmonary disease, unspecified: Secondary | ICD-10-CM | POA: Diagnosis not present

## 2014-08-14 DIAGNOSIS — K7469 Other cirrhosis of liver: Secondary | ICD-10-CM | POA: Diagnosis not present

## 2014-08-14 DIAGNOSIS — K7460 Unspecified cirrhosis of liver: Secondary | ICD-10-CM | POA: Diagnosis not present

## 2014-08-15 LAB — CBC
HEMATOCRIT: 36.6 % — AB (ref 39.0–52.0)
Hemoglobin: 11.7 g/dL — ABNORMAL LOW (ref 13.0–17.0)
MCH: 26.7 pg (ref 26.0–34.0)
MCHC: 32 g/dL (ref 30.0–36.0)
MCV: 83.4 fL (ref 78.0–100.0)
Platelets: 131 10*3/uL — ABNORMAL LOW (ref 150–400)
RBC: 4.39 MIL/uL (ref 4.22–5.81)
RDW: 19.4 % — AB (ref 11.5–15.5)
WBC: 4 10*3/uL (ref 4.0–10.5)

## 2014-08-20 ENCOUNTER — Other Ambulatory Visit: Payer: Self-pay

## 2014-08-20 DIAGNOSIS — K7460 Unspecified cirrhosis of liver: Secondary | ICD-10-CM

## 2014-08-20 DIAGNOSIS — Z8507 Personal history of malignant neoplasm of pancreas: Secondary | ICD-10-CM

## 2014-08-21 NOTE — Addendum Note (Signed)
Addended by: Stark Klein on: 08/21/2014 07:58 AM   Modules accepted: Orders

## 2014-09-02 DIAGNOSIS — I10 Essential (primary) hypertension: Secondary | ICD-10-CM | POA: Diagnosis not present

## 2014-09-02 DIAGNOSIS — E780 Pure hypercholesterolemia: Secondary | ICD-10-CM | POA: Diagnosis not present

## 2014-09-02 DIAGNOSIS — E039 Hypothyroidism, unspecified: Secondary | ICD-10-CM | POA: Diagnosis not present

## 2014-09-02 DIAGNOSIS — E1165 Type 2 diabetes mellitus with hyperglycemia: Secondary | ICD-10-CM | POA: Diagnosis not present

## 2014-09-09 DIAGNOSIS — E039 Hypothyroidism, unspecified: Secondary | ICD-10-CM | POA: Diagnosis not present

## 2014-09-09 DIAGNOSIS — I10 Essential (primary) hypertension: Secondary | ICD-10-CM | POA: Diagnosis not present

## 2014-09-09 DIAGNOSIS — E780 Pure hypercholesterolemia: Secondary | ICD-10-CM | POA: Diagnosis not present

## 2014-09-09 DIAGNOSIS — Z23 Encounter for immunization: Secondary | ICD-10-CM | POA: Diagnosis not present

## 2014-09-09 DIAGNOSIS — K7460 Unspecified cirrhosis of liver: Secondary | ICD-10-CM | POA: Diagnosis not present

## 2014-09-09 DIAGNOSIS — E1165 Type 2 diabetes mellitus with hyperglycemia: Secondary | ICD-10-CM | POA: Diagnosis not present

## 2014-09-10 ENCOUNTER — Other Ambulatory Visit: Payer: Self-pay

## 2014-09-10 DIAGNOSIS — K7460 Unspecified cirrhosis of liver: Secondary | ICD-10-CM

## 2014-09-10 DIAGNOSIS — J449 Chronic obstructive pulmonary disease, unspecified: Secondary | ICD-10-CM | POA: Diagnosis not present

## 2014-09-10 DIAGNOSIS — Z8507 Personal history of malignant neoplasm of pancreas: Secondary | ICD-10-CM

## 2014-10-07 ENCOUNTER — Ambulatory Visit (INDEPENDENT_AMBULATORY_CARE_PROVIDER_SITE_OTHER): Payer: Medicare Other | Admitting: Internal Medicine

## 2014-10-07 ENCOUNTER — Encounter (INDEPENDENT_AMBULATORY_CARE_PROVIDER_SITE_OTHER): Payer: Self-pay | Admitting: Internal Medicine

## 2014-10-07 VITALS — BP 130/70 | HR 82 | Temp 98.9°F | Resp 18 | Ht 70.0 in | Wt 226.7 lb

## 2014-10-07 DIAGNOSIS — K7460 Unspecified cirrhosis of liver: Secondary | ICD-10-CM

## 2014-10-07 DIAGNOSIS — D509 Iron deficiency anemia, unspecified: Secondary | ICD-10-CM

## 2014-10-07 MED ORDER — FERROUS SULFATE 325 (65 FE) MG PO TABS: 325.0000 mg | ORAL_TABLET | Freq: Every day | ORAL | Status: DC

## 2014-10-07 NOTE — Progress Notes (Signed)
Presenting complaint;  Follow-up for chronic liver disease/cirrhosis.  Database:  Patient is 74 year old Caucasian male who was evaluated for right-sided abdominal pain earlier this year and found to have retroperitoneal fibrosis and prominent lymph nodes as well as nodular contour to liver consistent with cirrhosis. Hepatitis B surface antigen, hepatitis C virus antibody were negative. ANA and smooth muscle antibody were negative. Iron studies revealed iron deficiency but no evidence to suggest hemochromatosis. Angiotensin-converting enzyme were normal. AFP was unremarkable. He underwent EGD on 06/03/2014 revealing grade 1-2 esophageal varices, portal gastropathy, jejunal mucosal edema. CLOtest was negative and biopsy from small bowel was negative for celiac disease. Patient declined to undergo colonoscopy. 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 He had focal lymphocytes in wall of bile ducts and he had portal and bridging fibrosis. Autoimmune hepatitis and PBC mention in differential diagnosis. Iron and PAS stains were negative. Regarding retroperitoneal fibrosis Dr. Barry Dienes elected to wait and patient has follow-up visit with her.   Subjective:  Patient has no complaints. Since his last visit of 05/26/2014 he has lost 8 pounds. He states he has lost 32 pounds in one year. He was begun on iron but he stopped it after taking it for 1 month. However he feels stronger. He is able to do more physical work and able to walk more. He is limited because of his COPD. Bowels move every day. He denies melena or rectal bleeding or abdominal pain. Family history is negative for chronic liver disease. He has a copy of blood work that was just performed by Dr. Quintin Alto last month.     Current Medications: Outpatient Encounter Prescriptions as of 10/07/2014  Medication Sig  . aspirin EC 81 MG tablet Take 81 mg by mouth daily.  Marland Kitchen glipiZIDE (GLUCOTROL) 10 MG tablet  Take 10 mg by mouth 2 (two) times daily before a meal.  . levothyroxine (SYNTHROID, LEVOTHROID) 150 MCG tablet Take 150 mcg by mouth daily before breakfast.  . lisinopril (PRINIVIL,ZESTRIL) 20 MG tablet Take 20 mg by mouth daily.  . metFORMIN (GLUCOPHAGE) 850 MG tablet Take 850 mg by mouth 3 (three) times daily with meals.  . simvastatin (ZOCOR) 40 MG tablet Take 40 mg by mouth at bedtime.   . [DISCONTINUED] ferrous sulfate 325 (65 FE) MG tablet Take 325 mg by mouth 2 (two) times daily with a meal.   No facility-administered encounter medications on file as of 10/07/2014.     Objective: Blood pressure 130/70, pulse 82, temperature 98.9 F (37.2 C), temperature source Oral, resp. rate 18, height 5' 10"  (1.778 m), weight 226 lb 11.2 oz (102.83 kg). Patient is alert and in no acute distress. He does not have asterixis. Conjunctiva is pink. Sclera is nonicteric Oropharyngeal mucosa is normal. No neck masses or thyromegaly noted. Cardiac exam with regular rhythm normal S1 and S2. No murmur or gallop noted. Lungs are clear to auscultation. Abdomen. He has upper midline scar in abdominal wall in this area as nodular into the indurated. Rest of the abdomen is soft and nontender without hepatosplenomegaly.  No LE edema or clubbing noted.  Labs/studies Results: Lab data from 09/02/2014 Glucose 196, BUN 11, creatinine 0.80, serum sodium 141, potassium 4.3, chloride 1 was 1, CO2 24, serum calcium 9.3 Bilirubin 0.9, AP 111, AST 24, ALT for 18, total protein 6.8 and albumin 4.0  H&H was 11.7 and 36.6 on 08/14/2014    Assessment:  #1. Cirrhosis. Etiology not established yet. Given history of diabetes nonalcoholic steatohepatitis possible but  liver biopsy not consistent with this diagnosis. Liver biopsy suggested autoimmune process or PBC. Autoimmune markers for type I disease are negative and PBC unlikely given his gender. He has well compensated hepatic function. Since there is no evidence of  progressive disease and he does not have any markers that could be followed I would not recommend empiric therapy with prednisone. However will repeat markers for autoimmune disease. If markers for type I autoimmune disease are negative may consider markers for type II disease. Patient has small esophageal varices but he is not a candidate for nonselective beta blocker therapy on account of COPD.  #2. Iron deficiency anemia. Patient needs to go back on by mouth iron. His hemoglobin had improved by more than 2 g with by mouth iron. #3. Retroperitoneal fibrosis. Patient is to follow with Dr. Barry Dienes.   Plan:  Patient advised to go back on ferrous sulfate 325 mg by mouth daily. CBC. Sedimentation rate. ANA. Anti-mitochondrial antibody. Patient would talk with Dr. Quintin Alto about getting hepatitis A and B vaccination. Office visit in 6 months.

## 2014-10-07 NOTE — Patient Instructions (Addendum)
Physician will call with results of blood work when completed. Check with Dr. Quintin Alto about getting hepatitis A and B vaccination.

## 2014-10-08 LAB — CBC
HCT: 33.2 % — ABNORMAL LOW (ref 39.0–52.0)
HEMOGLOBIN: 11 g/dL — AB (ref 13.0–17.0)
MCH: 28.2 pg (ref 26.0–34.0)
MCHC: 33.1 g/dL (ref 30.0–36.0)
MCV: 85.1 fL (ref 78.0–100.0)
MPV: 11.3 fL (ref 8.6–12.4)
PLATELETS: 132 10*3/uL — AB (ref 150–400)
RBC: 3.9 MIL/uL — ABNORMAL LOW (ref 4.22–5.81)
RDW: 14.8 % (ref 11.5–15.5)
WBC: 4.6 10*3/uL (ref 4.0–10.5)

## 2014-10-08 LAB — ANA: ANA: NEGATIVE

## 2014-10-08 LAB — MITOCHONDRIAL ANTIBODIES: Mitochondrial M2 Ab, IgG: 0.21 (ref <0.91)

## 2014-10-08 LAB — SEDIMENTATION RATE: Sed Rate: 27 mm/hr — ABNORMAL HIGH (ref 0–20)

## 2014-10-10 DIAGNOSIS — J449 Chronic obstructive pulmonary disease, unspecified: Secondary | ICD-10-CM | POA: Diagnosis not present

## 2014-10-13 DIAGNOSIS — Z23 Encounter for immunization: Secondary | ICD-10-CM | POA: Diagnosis not present

## 2014-10-20 ENCOUNTER — Other Ambulatory Visit: Payer: Self-pay | Admitting: General Surgery

## 2014-10-20 DIAGNOSIS — N135 Crossing vessel and stricture of ureter without hydronephrosis: Secondary | ICD-10-CM

## 2014-10-22 ENCOUNTER — Encounter (INDEPENDENT_AMBULATORY_CARE_PROVIDER_SITE_OTHER): Payer: Self-pay

## 2014-10-27 ENCOUNTER — Ambulatory Visit
Admission: RE | Admit: 2014-10-27 | Discharge: 2014-10-27 | Disposition: A | Discharge status: Home / Self Care | Payer: Medicare Other | Source: Ambulatory Visit | Attending: General Surgery | Admitting: General Surgery

## 2014-10-27 DIAGNOSIS — R161 Splenomegaly, not elsewhere classified: Secondary | ICD-10-CM | POA: Diagnosis not present

## 2014-10-27 DIAGNOSIS — K439 Ventral hernia without obstruction or gangrene: Secondary | ICD-10-CM | POA: Diagnosis not present

## 2014-10-27 DIAGNOSIS — N135 Crossing vessel and stricture of ureter without hydronephrosis: Secondary | ICD-10-CM

## 2014-10-27 MED ORDER — IOPAMIDOL (ISOVUE-300) INJECTION 61%
125.0000 mL | Freq: Once | INTRAVENOUS | Status: AC | PRN
  Administered 2014-10-27: 125 mL via INTRAVENOUS

## 2014-11-10 DIAGNOSIS — J449 Chronic obstructive pulmonary disease, unspecified: Secondary | ICD-10-CM | POA: Diagnosis not present

## 2014-11-14 ENCOUNTER — Ambulatory Visit (INDEPENDENT_AMBULATORY_CARE_PROVIDER_SITE_OTHER): Payer: Medicare Other | Admitting: Urology

## 2014-11-14 DIAGNOSIS — N471 Phimosis: Secondary | ICD-10-CM

## 2014-11-14 DIAGNOSIS — R109 Unspecified abdominal pain: Secondary | ICD-10-CM | POA: Diagnosis not present

## 2014-11-14 DIAGNOSIS — C61 Malignant neoplasm of prostate: Secondary | ICD-10-CM

## 2014-12-11 DIAGNOSIS — J449 Chronic obstructive pulmonary disease, unspecified: Secondary | ICD-10-CM | POA: Diagnosis not present

## 2014-12-15 DIAGNOSIS — Z23 Encounter for immunization: Secondary | ICD-10-CM | POA: Diagnosis not present

## 2014-12-30 DIAGNOSIS — I10 Essential (primary) hypertension: Secondary | ICD-10-CM | POA: Diagnosis not present

## 2014-12-30 DIAGNOSIS — E7800 Pure hypercholesterolemia, unspecified: Secondary | ICD-10-CM | POA: Diagnosis not present

## 2014-12-30 DIAGNOSIS — E1165 Type 2 diabetes mellitus with hyperglycemia: Secondary | ICD-10-CM | POA: Diagnosis not present

## 2015-01-06 DIAGNOSIS — K7460 Unspecified cirrhosis of liver: Secondary | ICD-10-CM | POA: Diagnosis not present

## 2015-01-06 DIAGNOSIS — I10 Essential (primary) hypertension: Secondary | ICD-10-CM | POA: Diagnosis not present

## 2015-01-06 DIAGNOSIS — E1165 Type 2 diabetes mellitus with hyperglycemia: Secondary | ICD-10-CM | POA: Diagnosis not present

## 2015-01-06 DIAGNOSIS — E039 Hypothyroidism, unspecified: Secondary | ICD-10-CM | POA: Diagnosis not present

## 2015-01-06 DIAGNOSIS — E7800 Pure hypercholesterolemia, unspecified: Secondary | ICD-10-CM | POA: Diagnosis not present

## 2015-01-06 DIAGNOSIS — N410 Acute prostatitis: Secondary | ICD-10-CM | POA: Diagnosis not present

## 2015-01-10 DIAGNOSIS — J449 Chronic obstructive pulmonary disease, unspecified: Secondary | ICD-10-CM | POA: Diagnosis not present

## 2015-01-28 ENCOUNTER — Encounter (INDEPENDENT_AMBULATORY_CARE_PROVIDER_SITE_OTHER): Payer: Self-pay | Admitting: *Deleted

## 2015-02-10 DIAGNOSIS — J449 Chronic obstructive pulmonary disease, unspecified: Secondary | ICD-10-CM | POA: Diagnosis not present

## 2015-03-12 DIAGNOSIS — J449 Chronic obstructive pulmonary disease, unspecified: Secondary | ICD-10-CM | POA: Diagnosis not present

## 2015-04-06 ENCOUNTER — Telehealth (INDEPENDENT_AMBULATORY_CARE_PROVIDER_SITE_OTHER): Payer: Self-pay | Admitting: Internal Medicine

## 2015-04-06 DIAGNOSIS — D509 Iron deficiency anemia, unspecified: Secondary | ICD-10-CM

## 2015-04-06 NOTE — Telephone Encounter (Signed)
Reviewed the patient's chart. No labs seen that would need to be done prior to OV 04/14/15. Called the patient's wife advised her that I would check with Dr.Rehman and call her tomorrow.

## 2015-04-06 NOTE — Telephone Encounter (Signed)
Mrs. Moya called saying the pt needs lab orders to pick up from the office before his appt on 1.24.17. She'd like a phone call regarding this.   Pt's ph# [phone] Thank you.

## 2015-04-07 DIAGNOSIS — D509 Iron deficiency anemia, unspecified: Secondary | ICD-10-CM | POA: Diagnosis not present

## 2015-04-07 NOTE — Telephone Encounter (Signed)
Per Dr.Rehman the patient will need to have labs drawn , CBC - Met -7 prior to OV.

## 2015-04-08 LAB — BASIC METABOLIC PANEL
BUN: 13 mg/dL (ref 7–25)
CO2: 25 mmol/L (ref 20–31)
Calcium: 9.6 mg/dL (ref 8.6–10.3)
Chloride: 106 mmol/L (ref 98–110)
Creat: 0.83 mg/dL (ref 0.70–1.18)
GLUCOSE: 238 mg/dL — AB (ref 65–99)
POTASSIUM: 4.2 mmol/L (ref 3.5–5.3)
Sodium: 143 mmol/L (ref 135–146)

## 2015-04-08 LAB — CBC
HEMATOCRIT: 39.2 % (ref 39.0–52.0)
HEMOGLOBIN: 12.9 g/dL — AB (ref 13.0–17.0)
MCH: 30.1 pg (ref 26.0–34.0)
MCHC: 32.9 g/dL (ref 30.0–36.0)
MCV: 91.6 fL (ref 78.0–100.0)
MPV: 11.1 fL (ref 8.6–12.4)
Platelets: 132 10*3/uL — ABNORMAL LOW (ref 150–400)
RBC: 4.28 MIL/uL (ref 4.22–5.81)
RDW: 14 % (ref 11.5–15.5)
WBC: 4.4 10*3/uL (ref 4.0–10.5)

## 2015-04-12 DIAGNOSIS — J449 Chronic obstructive pulmonary disease, unspecified: Secondary | ICD-10-CM | POA: Diagnosis not present

## 2015-04-14 ENCOUNTER — Ambulatory Visit (INDEPENDENT_AMBULATORY_CARE_PROVIDER_SITE_OTHER): Payer: Medicare Other | Admitting: Internal Medicine

## 2015-04-14 ENCOUNTER — Encounter (INDEPENDENT_AMBULATORY_CARE_PROVIDER_SITE_OTHER): Payer: Self-pay | Admitting: Internal Medicine

## 2015-04-14 VITALS — BP 140/66 | HR 75 | Temp 99.7°F | Resp 18 | Ht 70.0 in | Wt 224.2 lb

## 2015-04-14 DIAGNOSIS — K7460 Unspecified cirrhosis of liver: Secondary | ICD-10-CM

## 2015-04-14 DIAGNOSIS — R1011 Right upper quadrant pain: Secondary | ICD-10-CM | POA: Diagnosis not present

## 2015-04-14 DIAGNOSIS — D509 Iron deficiency anemia, unspecified: Secondary | ICD-10-CM | POA: Diagnosis not present

## 2015-04-14 NOTE — Patient Instructions (Signed)
Please remember to walk at least 4 times a week if not daily. Try to walk for 30 minutes each time. Physician will call with results of blood test and ultrasound when completed.

## 2015-04-14 NOTE — Progress Notes (Signed)
Presenting complaint;  Follow-up for cirrhosis and iron deficiency anemia.  Subjective:  Patient is 75 year old Caucasian male who has biopsy-proven cirrhosis secondary to NASH who is here for scheduled visit. He was last seen 6 months ago. He has no complaints. He does walk but not on regular basis. He plans to start walking when the weather changes. He has lost 2 pounds. He remains with good appetite. He denies heartburn or dysphagia nausea or vomiting. He does complain of intermittent pain below the right costal margin. He has his pain once or twice a week. He feels as if this area swells up. This pain may last for several minutes were couple hours and resolved spontaneously. He denies melena or rectal bleeding. His bowels move daily. He has completed hepatitis A and B vaccination. He says insurance paid for the first dose but not the subsequent doses. He does not check his glucose levels at home.   Current Medications: Outpatient Encounter Prescriptions as of 04/14/2015  Medication Sig  . aspirin EC 81 MG tablet Take 81 mg by mouth daily.  . ferrous sulfate 325 (65 FE) MG tablet Take 1 tablet (325 mg total) by mouth daily with breakfast.  . glipiZIDE (GLUCOTROL) 10 MG tablet Take 10 mg by mouth 2 (two) times daily before a meal.  . levothyroxine (SYNTHROID, LEVOTHROID) 137 MCG tablet Take 137 mcg by mouth daily before breakfast.   . lisinopril (PRINIVIL,ZESTRIL) 20 MG tablet Take 20 mg by mouth daily.  . metFORMIN (GLUCOPHAGE) 850 MG tablet Take 850 mg by mouth 3 (three) times daily with meals.  . simvastatin (ZOCOR) 40 MG tablet Take 40 mg by mouth at bedtime.   . [DISCONTINUED] levothyroxine (SYNTHROID, LEVOTHROID) 150 MCG tablet Take 150 mcg by mouth daily before breakfast. Reported on 04/14/2015   No facility-administered encounter medications on file as of 04/14/2015.     Objective: Blood pressure 140/66, pulse 75, temperature 99.7 F (37.6 C), temperature source Oral, resp. rate  18, height 5' 10"  (1.778 m), weight 224 lb 3.2 oz (101.696 kg). Patient is alert and in no acute distress. Asterixis absent. Conjunctiva is pink. Sclera is nonicteric Oropharyngeal mucosa is normal. No neck masses or thyromegaly noted. Cardiac exam with regular rhythm normal S1 and S2. No murmur or gallop noted. Lungs are clear to auscultation. Abdomen is protuberant with upper midline scar. On palpation abdomen is soft with firmness on either side of the scar but more so on the right side with the localized prominence but cough impulse is absent. Spleen is not palpable. No LE edema or clubbing noted.  Labs/studies Results: Lab data from 04/07/2015. WBC 4.4, H&H 12.9 and 39.2 and platelet count 132K Serum sodium 143, potassium 4.2, chloride 106, CO2 25, glucose 238, BUN 13 and creatinine 0.83. Serum calcium 9.6.    Assessment:  #1. Iron deficiency anemia. H&H is almost back to normal with by mouth iron therapy. Hemoccults in the past been negative. He underwent EGD in March 2016 revealing single column of grade 1-2 esophageal varices portal gastropathy. CLOtest was negative. #2. Cirrhosis secondary to NASH. He has preserved hepatic function. He had EGD in March last year looking for varices and he only had single column which was not large enough to be banded. He is due for [facility] screening. #3. Right upper quadrant abdominal pain possibly secondary to ventral hernia also documented on prior CT. He is due for ultrasound which would allow [place] to also look at his bile duct and pancreas. #4. Diabetes mellitus.  Glycemic control does not appear to be optimal. This needs to improve in order to have less detrimental effect on his liver disease.   Plan:  Patient will continue ferrous sulfate 325 mg by mouth daily. Patient encouraged to walk at least 3-4 times a week for 30 minutes each time. Serum AFP. Upper abdominal ultrasound. Patient encouraged to follow with Dr. Quintin Alto regarding diabetes  mellitus. Office visit in 6 months.

## 2015-04-15 ENCOUNTER — Encounter (INDEPENDENT_AMBULATORY_CARE_PROVIDER_SITE_OTHER): Payer: Self-pay | Admitting: Internal Medicine

## 2015-04-15 DIAGNOSIS — Z23 Encounter for immunization: Secondary | ICD-10-CM | POA: Insufficient documentation

## 2015-04-16 DIAGNOSIS — Z23 Encounter for immunization: Secondary | ICD-10-CM | POA: Diagnosis not present

## 2015-04-21 ENCOUNTER — Other Ambulatory Visit (HOSPITAL_COMMUNITY): Payer: Medicare Other

## 2015-04-22 DIAGNOSIS — R109 Unspecified abdominal pain: Secondary | ICD-10-CM | POA: Diagnosis not present

## 2015-04-22 DIAGNOSIS — K7460 Unspecified cirrhosis of liver: Secondary | ICD-10-CM | POA: Diagnosis not present

## 2015-04-22 DIAGNOSIS — C61 Malignant neoplasm of prostate: Secondary | ICD-10-CM | POA: Diagnosis not present

## 2015-04-23 LAB — AFP TUMOR MARKER: AFP-Tumor Marker: 1.3 ng/mL (ref <6.1)

## 2015-04-27 ENCOUNTER — Telehealth (INDEPENDENT_AMBULATORY_CARE_PROVIDER_SITE_OTHER): Payer: Self-pay | Admitting: *Deleted

## 2015-04-27 DIAGNOSIS — K7460 Unspecified cirrhosis of liver: Secondary | ICD-10-CM

## 2015-04-27 NOTE — Telephone Encounter (Signed)
Per Dr.Rehman the patient will need to have labs drawn in 6 months. 

## 2015-05-04 ENCOUNTER — Ambulatory Visit (HOSPITAL_COMMUNITY)
Admission: RE | Admit: 2015-05-04 | Discharge: 2015-05-04 | Disposition: A | Discharge status: Home / Self Care | Payer: Medicare Other | Source: Ambulatory Visit | Attending: Internal Medicine | Admitting: Internal Medicine

## 2015-05-04 DIAGNOSIS — K7460 Unspecified cirrhosis of liver: Secondary | ICD-10-CM | POA: Diagnosis not present

## 2015-05-04 DIAGNOSIS — N281 Cyst of kidney, acquired: Secondary | ICD-10-CM | POA: Diagnosis not present

## 2015-05-04 DIAGNOSIS — R161 Splenomegaly, not elsewhere classified: Secondary | ICD-10-CM | POA: Insufficient documentation

## 2015-05-04 DIAGNOSIS — R1011 Right upper quadrant pain: Secondary | ICD-10-CM | POA: Insufficient documentation

## 2015-05-08 DIAGNOSIS — E1165 Type 2 diabetes mellitus with hyperglycemia: Secondary | ICD-10-CM | POA: Diagnosis not present

## 2015-05-08 DIAGNOSIS — I10 Essential (primary) hypertension: Secondary | ICD-10-CM | POA: Diagnosis not present

## 2015-05-08 DIAGNOSIS — E039 Hypothyroidism, unspecified: Secondary | ICD-10-CM | POA: Diagnosis not present

## 2015-05-08 DIAGNOSIS — E7800 Pure hypercholesterolemia, unspecified: Secondary | ICD-10-CM | POA: Diagnosis not present

## 2015-05-13 DIAGNOSIS — J449 Chronic obstructive pulmonary disease, unspecified: Secondary | ICD-10-CM | POA: Diagnosis not present

## 2015-05-15 DIAGNOSIS — E039 Hypothyroidism, unspecified: Secondary | ICD-10-CM | POA: Diagnosis not present

## 2015-06-05 ENCOUNTER — Ambulatory Visit: Payer: Medicare Other | Admitting: Urology

## 2015-06-05 DIAGNOSIS — R350 Frequency of micturition: Secondary | ICD-10-CM

## 2015-06-05 DIAGNOSIS — R351 Nocturia: Secondary | ICD-10-CM | POA: Diagnosis not present

## 2015-06-05 DIAGNOSIS — C61 Malignant neoplasm of prostate: Secondary | ICD-10-CM | POA: Diagnosis not present

## 2015-06-10 DIAGNOSIS — J449 Chronic obstructive pulmonary disease, unspecified: Secondary | ICD-10-CM | POA: Diagnosis not present

## 2015-07-11 DIAGNOSIS — J449 Chronic obstructive pulmonary disease, unspecified: Secondary | ICD-10-CM | POA: Diagnosis not present

## 2015-08-10 DIAGNOSIS — J449 Chronic obstructive pulmonary disease, unspecified: Secondary | ICD-10-CM | POA: Diagnosis not present

## 2015-08-21 DIAGNOSIS — N410 Acute prostatitis: Secondary | ICD-10-CM | POA: Diagnosis not present

## 2015-09-07 DIAGNOSIS — E7800 Pure hypercholesterolemia, unspecified: Secondary | ICD-10-CM | POA: Diagnosis not present

## 2015-09-07 DIAGNOSIS — I10 Essential (primary) hypertension: Secondary | ICD-10-CM | POA: Diagnosis not present

## 2015-09-07 DIAGNOSIS — D649 Anemia, unspecified: Secondary | ICD-10-CM | POA: Diagnosis not present

## 2015-09-07 DIAGNOSIS — E039 Hypothyroidism, unspecified: Secondary | ICD-10-CM | POA: Diagnosis not present

## 2015-09-07 DIAGNOSIS — E1165 Type 2 diabetes mellitus with hyperglycemia: Secondary | ICD-10-CM | POA: Diagnosis not present

## 2015-09-10 DIAGNOSIS — E7800 Pure hypercholesterolemia, unspecified: Secondary | ICD-10-CM | POA: Diagnosis not present

## 2015-09-10 DIAGNOSIS — J449 Chronic obstructive pulmonary disease, unspecified: Secondary | ICD-10-CM | POA: Diagnosis not present

## 2015-09-10 DIAGNOSIS — E039 Hypothyroidism, unspecified: Secondary | ICD-10-CM | POA: Diagnosis not present

## 2015-09-10 DIAGNOSIS — E1165 Type 2 diabetes mellitus with hyperglycemia: Secondary | ICD-10-CM | POA: Diagnosis not present

## 2015-09-10 DIAGNOSIS — Z1212 Encounter for screening for malignant neoplasm of rectum: Secondary | ICD-10-CM | POA: Diagnosis not present

## 2015-09-10 DIAGNOSIS — I10 Essential (primary) hypertension: Secondary | ICD-10-CM | POA: Diagnosis not present

## 2015-09-10 DIAGNOSIS — K7460 Unspecified cirrhosis of liver: Secondary | ICD-10-CM | POA: Diagnosis not present

## 2015-10-10 DIAGNOSIS — J449 Chronic obstructive pulmonary disease, unspecified: Secondary | ICD-10-CM | POA: Diagnosis not present

## 2015-10-14 ENCOUNTER — Encounter (INDEPENDENT_AMBULATORY_CARE_PROVIDER_SITE_OTHER): Payer: Self-pay | Admitting: *Deleted

## 2015-10-15 ENCOUNTER — Other Ambulatory Visit (INDEPENDENT_AMBULATORY_CARE_PROVIDER_SITE_OTHER): Payer: Self-pay | Admitting: *Deleted

## 2015-10-15 ENCOUNTER — Encounter (INDEPENDENT_AMBULATORY_CARE_PROVIDER_SITE_OTHER): Payer: Self-pay | Admitting: *Deleted

## 2015-10-15 DIAGNOSIS — K7460 Unspecified cirrhosis of liver: Secondary | ICD-10-CM

## 2015-10-20 ENCOUNTER — Ambulatory Visit (INDEPENDENT_AMBULATORY_CARE_PROVIDER_SITE_OTHER): Payer: Medicare Other | Admitting: Internal Medicine

## 2015-10-20 ENCOUNTER — Encounter (INDEPENDENT_AMBULATORY_CARE_PROVIDER_SITE_OTHER): Payer: Self-pay | Admitting: Internal Medicine

## 2015-10-20 VITALS — BP 130/78 | HR 74 | Temp 98.3°F | Resp 18 | Ht 70.0 in | Wt 220.6 lb

## 2015-10-20 DIAGNOSIS — R195 Other fecal abnormalities: Secondary | ICD-10-CM | POA: Diagnosis not present

## 2015-10-20 DIAGNOSIS — D509 Iron deficiency anemia, unspecified: Secondary | ICD-10-CM

## 2015-10-20 DIAGNOSIS — K7460 Unspecified cirrhosis of liver: Secondary | ICD-10-CM | POA: Diagnosis not present

## 2015-10-20 NOTE — Progress Notes (Signed)
Presenting complaint;  Heme positive stool. Follow-up for chronic liver disease/cirrhosis.  Database and Subjective:  Patient is 75 year old Caucasian male was here for scheduled visit. He was initially seen in March 2016 for newly diagnosed cirrhosis. At that time he was evaluated by Dr. Barry Dienes for abdominal pain and ventral hernia as well as retroperitoneal fibrosis. Workup suggested cirrhosis secondary to NASH. He underwent EGD in March 2016 revealing grade 1-2 esophageal varices and altered upper GI tract anatomy secondary to previous surgery for peptic ulcer disease. He was also found to have iron deficiency anemia but declined to undergo further workup i.e. Colonoscopy. Patient was last seen on 04/14/2015 and was doing well. He has been getting screening ultrasound and AFP every 6 months.   Patient states his stool was guaiac positive by Dr. Consuello Masse and now he is agreeable to undergo colonoscopy. He denies melena or rectal bleeding. He states his stools have been black since he has been on iron. He remains with good appetite. He has been having right upper quadrant pain last week but it has improved. He remains on low salt diet. He denies nausea or vomiting.  He has lost 4 pounds since his last visit. He states he is not able to do much exercise because of hot weather. He is on low-dose aspirin does not take other OTC NSAIDs.   Current Medications: Outpatient Encounter Prescriptions as of 10/20/2015  Medication Sig  . aspirin EC 81 MG tablet Take 81 mg by mouth daily.  . ferrous sulfate 325 (65 FE) MG tablet Take 1 tablet (325 mg total) by mouth daily with breakfast.  . glipiZIDE (GLUCOTROL) 10 MG tablet Take 10 mg by mouth 2 (two) times daily before a meal.  . levothyroxine (SYNTHROID, LEVOTHROID) 137 MCG tablet Take 137 mcg by mouth daily before breakfast.   . lisinopril (PRINIVIL,ZESTRIL) 20 MG tablet Take 20 mg by mouth daily.  . metFORMIN (GLUCOPHAGE) 850 MG tablet Take 850 mg  by mouth 3 (three) times daily with meals.  . simvastatin (ZOCOR) 40 MG tablet Take 40 mg by mouth at bedtime.    No facility-administered encounter medications on file as of 10/20/2015.      Objective: Blood pressure 130/78, pulse 74, temperature 98.3 F (36.8 C), temperature source Oral, resp. rate 18, height 5' 10"  (1.778 m), weight 220 lb 9.6 oz (100.1 kg). Patient is alert and does not have asterixis. Conjunctiva is pink. Sclera is nonicteric Oropharyngeal mucosa is normal. No neck masses or thyromegaly noted. Cardiac exam with regular rhythm normal S1 and S2. No murmur or gallop noted. Lungs are clear to auscultation. Abdomen abdomen is full with upper midline scar and irregular abdominal wall in right upper quadrant but no tenderness. Liver order spleen not palpable. No LE edema or clubbing noted.  Labs/studies Results: H&H was 12.9 and 39.2 on 04/07/2015  Recent blood work is not available.    Assessment:  #1. Heme positive stool. He has history of iron deficiency anemia which has partially corrected with by mouth iron. He had EGD in March 2016 when he declined colonoscopy. He is now willing to undergo colonoscopy for diagnostic purposes. #2. Cirrhosis secondary to NASH. He has compensated disease. He must exercise and try to lose weight gradually and diabetic control needs to improve as well. He has completed hepatitis A and B vaccination.   Plan:  Will request copy of recent blood work from Dr. Edythe Lynn office before ordering further tests. Diagnostic colonoscopy. If colonoscopy is negative may consider  EGD primarily to assess variceal size and determine if primary prophylaxis with banding indicated. Patient advised to stop ferrous sulfate until endoscopic evaluation can be completed. Hepatic ultrasound. Office visit in 6 months.

## 2015-10-20 NOTE — Patient Instructions (Signed)
Colonoscopy to be scheduled. Not take iron until after colonoscopy. Do not take aspirin for 2 days prior to procedure. Upper abdominal ultrasound to be scheduled

## 2015-10-22 ENCOUNTER — Other Ambulatory Visit (INDEPENDENT_AMBULATORY_CARE_PROVIDER_SITE_OTHER): Payer: Self-pay | Admitting: *Deleted

## 2015-10-22 DIAGNOSIS — K7460 Unspecified cirrhosis of liver: Secondary | ICD-10-CM

## 2015-10-22 DIAGNOSIS — D509 Iron deficiency anemia, unspecified: Secondary | ICD-10-CM

## 2015-10-22 DIAGNOSIS — R195 Other fecal abnormalities: Secondary | ICD-10-CM

## 2015-10-23 DIAGNOSIS — K7460 Unspecified cirrhosis of liver: Secondary | ICD-10-CM | POA: Insufficient documentation

## 2015-10-23 DIAGNOSIS — D509 Iron deficiency anemia, unspecified: Secondary | ICD-10-CM | POA: Insufficient documentation

## 2015-10-23 DIAGNOSIS — R195 Other fecal abnormalities: Secondary | ICD-10-CM | POA: Insufficient documentation

## 2015-10-26 ENCOUNTER — Telehealth (INDEPENDENT_AMBULATORY_CARE_PROVIDER_SITE_OTHER): Payer: Self-pay | Admitting: *Deleted

## 2015-10-26 ENCOUNTER — Encounter (INDEPENDENT_AMBULATORY_CARE_PROVIDER_SITE_OTHER): Payer: Self-pay | Admitting: *Deleted

## 2015-10-26 NOTE — Telephone Encounter (Signed)
Patient needs trilyte 

## 2015-10-27 MED ORDER — PEG 3350-KCL-NA BICARB-NACL 420 G PO SOLR: 4000.0000 mL | Freq: Once | ORAL | 0 refills | Status: AC

## 2015-10-30 ENCOUNTER — Ambulatory Visit (HOSPITAL_COMMUNITY)
Admission: RE | Admit: 2015-10-30 | Discharge: 2015-10-30 | Disposition: A | Discharge status: Home / Self Care | Payer: Medicare Other | Source: Ambulatory Visit | Attending: Internal Medicine | Admitting: Internal Medicine

## 2015-10-30 DIAGNOSIS — K7460 Unspecified cirrhosis of liver: Secondary | ICD-10-CM | POA: Diagnosis not present

## 2015-10-30 DIAGNOSIS — R933 Abnormal findings on diagnostic imaging of other parts of digestive tract: Secondary | ICD-10-CM | POA: Diagnosis not present

## 2015-11-05 ENCOUNTER — Encounter (INDEPENDENT_AMBULATORY_CARE_PROVIDER_SITE_OTHER): Payer: Self-pay

## 2015-11-10 DIAGNOSIS — J449 Chronic obstructive pulmonary disease, unspecified: Secondary | ICD-10-CM | POA: Diagnosis not present

## 2015-11-13 ENCOUNTER — Encounter (HOSPITAL_COMMUNITY): Payer: Self-pay | Admitting: *Deleted

## 2015-11-13 ENCOUNTER — Ambulatory Visit (HOSPITAL_COMMUNITY)
Admission: RE | Admit: 2015-11-13 | Discharge: 2015-11-13 | Disposition: A | Discharge status: Home / Self Care | Payer: Medicare Other | Source: Ambulatory Visit | Attending: Internal Medicine | Admitting: Internal Medicine

## 2015-11-13 ENCOUNTER — Encounter (HOSPITAL_COMMUNITY)
Admission: RE | Disposition: A | Discharge status: Home / Self Care | Payer: Self-pay | Source: Ambulatory Visit | Attending: Internal Medicine

## 2015-11-13 DIAGNOSIS — Z7984 Long term (current) use of oral hypoglycemic drugs: Secondary | ICD-10-CM | POA: Insufficient documentation

## 2015-11-13 DIAGNOSIS — Z7982 Long term (current) use of aspirin: Secondary | ICD-10-CM | POA: Insufficient documentation

## 2015-11-13 DIAGNOSIS — K6389 Other specified diseases of intestine: Secondary | ICD-10-CM | POA: Diagnosis not present

## 2015-11-13 DIAGNOSIS — K644 Residual hemorrhoidal skin tags: Secondary | ICD-10-CM | POA: Insufficient documentation

## 2015-11-13 DIAGNOSIS — K645 Perianal venous thrombosis: Secondary | ICD-10-CM | POA: Diagnosis not present

## 2015-11-13 DIAGNOSIS — D122 Benign neoplasm of ascending colon: Secondary | ICD-10-CM | POA: Diagnosis not present

## 2015-11-13 DIAGNOSIS — K7460 Unspecified cirrhosis of liver: Secondary | ICD-10-CM | POA: Insufficient documentation

## 2015-11-13 DIAGNOSIS — K766 Portal hypertension: Secondary | ICD-10-CM | POA: Diagnosis not present

## 2015-11-13 DIAGNOSIS — Q858 Other phakomatoses, not elsewhere classified: Secondary | ICD-10-CM | POA: Diagnosis not present

## 2015-11-13 DIAGNOSIS — E079 Disorder of thyroid, unspecified: Secondary | ICD-10-CM | POA: Diagnosis not present

## 2015-11-13 DIAGNOSIS — D123 Benign neoplasm of transverse colon: Secondary | ICD-10-CM | POA: Diagnosis not present

## 2015-11-13 DIAGNOSIS — E7800 Pure hypercholesterolemia, unspecified: Secondary | ICD-10-CM | POA: Insufficient documentation

## 2015-11-13 DIAGNOSIS — D509 Iron deficiency anemia, unspecified: Secondary | ICD-10-CM | POA: Diagnosis not present

## 2015-11-13 DIAGNOSIS — Z79899 Other long term (current) drug therapy: Secondary | ICD-10-CM | POA: Insufficient documentation

## 2015-11-13 DIAGNOSIS — K5520 Angiodysplasia of colon without hemorrhage: Secondary | ICD-10-CM | POA: Diagnosis not present

## 2015-11-13 DIAGNOSIS — M159 Polyosteoarthritis, unspecified: Secondary | ICD-10-CM | POA: Diagnosis not present

## 2015-11-13 DIAGNOSIS — Z8719 Personal history of other diseases of the digestive system: Secondary | ICD-10-CM | POA: Diagnosis not present

## 2015-11-13 DIAGNOSIS — R195 Other fecal abnormalities: Secondary | ICD-10-CM | POA: Insufficient documentation

## 2015-11-13 DIAGNOSIS — E119 Type 2 diabetes mellitus without complications: Secondary | ICD-10-CM | POA: Insufficient documentation

## 2015-11-13 DIAGNOSIS — Z87891 Personal history of nicotine dependence: Secondary | ICD-10-CM | POA: Diagnosis not present

## 2015-11-13 DIAGNOSIS — I10 Essential (primary) hypertension: Secondary | ICD-10-CM | POA: Diagnosis not present

## 2015-11-13 HISTORY — PX: COLONOSCOPY: SHX5424

## 2015-11-13 HISTORY — PX: POLYPECTOMY: SHX5525

## 2015-11-13 LAB — HEMOGLOBIN AND HEMATOCRIT, BLOOD
HEMATOCRIT: 33.6 % — AB (ref 39.0–52.0)
HEMOGLOBIN: 11.4 g/dL — AB (ref 13.0–17.0)

## 2015-11-13 LAB — GLUCOSE, CAPILLARY: GLUCOSE-CAPILLARY: 216 mg/dL — AB (ref 65–99)

## 2015-11-13 SURGERY — COLONOSCOPY
Anesthesia: Moderate Sedation

## 2015-11-13 MED ORDER — MEPERIDINE HCL 50 MG/ML IJ SOLN
INTRAMUSCULAR | Status: AC
  Filled 2015-11-13: qty 1

## 2015-11-13 MED ORDER — MIDAZOLAM HCL 5 MG/5ML IJ SOLN
INTRAMUSCULAR | Status: AC
  Filled 2015-11-13: qty 10

## 2015-11-13 MED ORDER — MEPERIDINE HCL 50 MG/ML IJ SOLN
INTRAMUSCULAR | Status: DC | PRN
  Administered 2015-11-13 (×2): 25 mg via INTRAVENOUS

## 2015-11-13 MED ORDER — MIDAZOLAM HCL 5 MG/5ML IJ SOLN
INTRAMUSCULAR | Status: DC | PRN
  Administered 2015-11-13: 2 mg via INTRAVENOUS
  Administered 2015-11-13: 1 mg via INTRAVENOUS
  Administered 2015-11-13: 2 mg via INTRAVENOUS

## 2015-11-13 MED ORDER — SODIUM CHLORIDE 0.9 % IV SOLN
INTRAVENOUS | Status: DC
  Administered 2015-11-13: 13:00:00 via INTRAVENOUS

## 2015-11-13 MED ORDER — STERILE WATER FOR IRRIGATION IR SOLN
Status: DC | PRN
  Administered 2015-11-13: 14:00:00

## 2015-11-13 NOTE — Discharge Instructions (Signed)
No aspirin or NSAIDs for 10 days. Resume medications and diet as before. No driving for 24 hours. Physician will call with results of blood tests and biopsy results.       Colonoscopy, Care After These instructions give you information on caring for yourself after your procedure. Your doctor may also give you more specific instructions. Call your doctor if you have any problems or questions after your procedure. HOME CARE  Do not drive for 24 hours.  Do not sign important papers or use machinery for 24 hours.  You may shower.  You may go back to your usual activities, but go slower for the first 24 hours.  Take rest breaks often during the first 24 hours.  Walk around or use warm packs on your belly (abdomen) if you have belly cramping or gas.  Drink enough fluids to keep your pee (urine) clear or pale yellow.  Resume your normal diet. Avoid heavy or fried foods.  Avoid drinking alcohol for 24 hours or as told by your doctor.  Only take medicines as told by your doctor. If a tissue sample (biopsy) was taken during the procedure:   Do not take aspirin or blood thinners for 7 days, or as told by your doctor.  Do not drink alcohol for 7 days, or as told by your doctor.  Eat soft foods for the first 24 hours. GET HELP IF: You still have a small amount of blood in your poop (stool) 2-3 days after the procedure. GET HELP RIGHT AWAY IF:  You have more than a small amount of blood in your poop.  You see clumps of tissue (blood clots) in your poop.  Your belly is puffy (swollen).  You feel sick to your stomach (nauseous) or throw up (vomit).  You have a fever.  You have belly pain that gets worse and medicine does not help. MAKE SURE YOU:  Understand these instructions.  Will watch your condition.  Will get help right away if you are not doing well or get worse.   This information is not intended to replace advice given to you by your health care provider. Make  sure you discuss any questions you have with your health care provider.   Document Released: 04/09/2010 Document Revised: 03/12/2013 Document Reviewed: 11/12/2012 Elsevier Interactive Patient Education 2016 Elsevier [employer]    Colon Polyps Polyps are lumps of extra tissue growing inside the body. Polyps can grow in the large intestine (colon). Most colon polyps are noncancerous (benign). However, some colon polyps can become cancerous over time. Polyps that are larger than a pea may be harmful. To be safe, caregivers remove and test all polyps. CAUSES  Polyps form when mutations in the genes cause your cells to grow and divide even though no more tissue is needed. RISK FACTORS There are a number of risk factors that can increase your chances of getting colon polyps. They include:  Being older than 50 years.  Family history of colon polyps or colon cancer.  Long-term colon diseases, such as colitis or Crohn disease.  Being overweight.  Smoking.  Being inactive.  Drinking too much alcohol. SYMPTOMS  Most small polyps do not cause symptoms. If symptoms are present, they may include:  Blood in the stool. The stool may look dark red or black.  Constipation or diarrhea that lasts longer than 1 week. DIAGNOSIS People often do not know they have polyps until their caregiver finds them during a regular checkup. Your caregiver can use 4  tests to check for polyps:  Digital rectal exam. The caregiver wears gloves and feels inside the rectum. This test would find polyps only in the rectum.  Barium enema. The caregiver puts a liquid called barium into your rectum before taking X-rays of your colon. Barium makes your colon look white. Polyps are dark, so they are easy to see in the X-ray pictures.  Sigmoidoscopy. A thin, flexible tube (sigmoidoscope) is placed into your rectum. The sigmoidoscope has a light and tiny camera in it. The caregiver uses the sigmoidoscope to look at the last third of  your colon.  Colonoscopy. This test is like sigmoidoscopy, but the caregiver looks at the entire colon. This is the most common method for finding and removing polyps. TREATMENT  Any polyps will be removed during a sigmoidoscopy or colonoscopy. The polyps are then tested for cancer. PREVENTION  To help lower your risk of getting more colon polyps:  Eat plenty of fruits and vegetables. Avoid eating fatty foods.  Do not smoke.  Avoid drinking alcohol.  Exercise every day.  Lose weight if recommended by your caregiver.  Eat plenty of calcium and folate. Foods that are rich in calcium include milk, cheese, and broccoli. Foods that are rich in folate include chickpeas, kidney beans, and spinach. HOME CARE INSTRUCTIONS Keep all follow-up appointments as directed by your caregiver. You may need periodic exams to check for polyps. SEEK MEDICAL CARE IF: You notice bleeding during a bowel movement.   This information is not intended to replace advice given to you by your health care provider. Make sure you discuss any questions you have with your health care provider.   Document Released: 12/02/2003 Document Revised: 03/28/2014 Document Reviewed: 05/17/2011 Elsevier Interactive Patient Education [employer]. .

## 2015-11-13 NOTE — H&P (Signed)
Tyler Farmer is an 75 y.o. male.   Chief Complaint: Patient is here for EGD. HPI: Patient is 75 year old Caucasian male with history of cirrhosis was found to have iron deficiency last year but declined to undergo colonoscopy when you EGD. He was seen by Dr. Quintin Alto noted to have heme-positive stool. He has mild anemia. Patient has agreed to undergoing diagnostic colonoscopy. He denies melena or rectal bleeding. EGD in March last year revealed small varices portal gastropathy but no bleeding lesions. He had patent BII anastomosis Family history is negative for CRC.  Past Medical History:  Diagnosis Date  . Arthritis    Hands/Fingers  . Back pain   . Diabetes mellitus ([place])   . Enlarged prostate   . Gastric ulcer   . GERD (gastroesophageal reflux disease)   . H/O bladder problems   . Hypercholesteremia   . Hypertension   . Kidney stone   . Prostate cancer ([place])   . Thyroid disease     Past Surgical History:  Procedure Laterality Date  . CIRCUMCISION     at age 48  . ESOPHAGOGASTRODUODENOSCOPY N/A 06/03/2014   Procedure: ESOPHAGOGASTRODUODENOSCOPY (EGD);  Surgeon: Rogene Houston, MD;  Location: [facility];  Service: Endoscopy;  Laterality: N/A;  730  . Gastric Ulcer  1993  . Right Elbow Right    A pin was put in    Family History  Problem Relation Age of Onset  . Emphysema Mother   . Cerebrovascular Accident Father   . Heart disease Father   . Diabetes Sister   . Heart disease Brother   . Heart disease Sister   . Heart disease Brother   . Diabetes Brother    Social History:  reports that he quit smoking about 26 years ago. He has never used smokeless tobacco. He reports that he does not drink alcohol. His drug history is not on file.  Allergies:  Allergies  Allergen Reactions  . Penicillins Itching    Patient states that it also caused pain in his sides.    Medications Prior to Admission  Medication Sig Dispense Refill  . aspirin EC 81 MG tablet Take 81 mg  by mouth daily.    Marland Kitchen glipiZIDE (GLUCOTROL) 10 MG tablet Take 10 mg by mouth 2 (two) times daily before a meal.    . levothyroxine (SYNTHROID, LEVOTHROID) 137 MCG tablet Take 137 mcg by mouth daily before breakfast.     . lisinopril (PRINIVIL,ZESTRIL) 20 MG tablet Take 20 mg by mouth daily.    . metFORMIN (GLUCOPHAGE) 850 MG tablet Take 850 mg by mouth 3 (three) times daily with meals.    . simvastatin (ZOCOR) 40 MG tablet Take 40 mg by mouth at bedtime.       Results for orders placed or performed during the hospital encounter of 11/13/15 (from the past 48 hour(s))  Glucose, capillary     Status: Abnormal   Collection Time: 11/13/15  2:05 PM  Result Value Ref Range   Glucose-Capillary 216 (H) 65 - 99 mg/dL   No results found.  ROS  Blood pressure 133/68, pulse 71, temperature 98.3 F (36.8 C), temperature source Oral, resp. rate 18, height 5' 9"  (1.753 m), weight 218 lb (98.9 kg), SpO2 98 %. Physical Exam  Constitutional: He appears well-developed and well-nourished.  HENT:  Mouth/Throat: Oropharynx is clear and moist.  Eyes: Conjunctivae are normal. No scleral icterus.  Neck: No thyromegaly present.  Cardiovascular: Normal rate, regular rhythm and normal heart sounds.  No murmur heard. Respiratory: Effort normal and breath sounds normal.  GI:  Abdomen is full with upper midline scar and ventral hernia need to decide. Abdomen soft and nontender no organomegaly or masses.  Musculoskeletal: He exhibits no edema.  Lymphadenopathy:    He has no cervical adenopathy.  Neurological: He is alert.  Skin: Skin is warm and dry.     Assessment/Plan Iron deficiency anemia and heme positive stool. Diagnostic colonoscopy.  Hildred Laser, MD 11/13/2015, 2:18 PM

## 2015-11-13 NOTE — Op Note (Signed)
[facility] Patient Name: Tyler Farmer Procedure Date: 11/13/2015 2:15 PM MRN: [id number] Date of Birth: 07/24/1940 Attending MD: Hildred Laser , MD CSN: [id number] Age: 75 Admit Type: Outpatient Procedure:                Colonoscopy Indications:              Heme positive stool, Iron deficiency anemia Providers:                Hildred Laser, MD, Otis Peak B. Sharon Seller, RN, Randa Spike, Technician Referring MD:             [name] MD, MD Medicines:                Meperidine 50 mg IV, Midazolam 5 mg IV Complications:            No immediate complications. Estimated Blood Loss:     Estimated blood loss: none. Procedure:                Pre-Anesthesia Assessment:                           - Prior to the procedure, a History and Physical                            was performed, and patient medications and                            allergies were reviewed. The patient's tolerance of                            previous anesthesia was also reviewed. The risks                            and benefits of the procedure and the sedation                            options and risks were discussed with the patient.                            All questions were answered, and informed consent                            was obtained. Prior Anticoagulants: The patient                            last took aspirin 3 days prior to the procedure.                            ASA Grade Assessment: III - A patient with severe                            systemic disease. After reviewing the risks and  benefits, the patient was deemed in satisfactory                            condition to undergo the procedure.                           After obtaining informed consent, the colonoscope                            was passed under direct vision. Throughout the                            procedure, the patient's blood pressure, pulse, and                       oxygen saturations were monitored continuously. The                            EC-[id number] ([id number]) was introduced through the                            anus and advanced to the the cecum, identified by                            appendiceal orifice and ileocecal valve. The                            colonoscopy was performed without difficulty. The                            patient tolerated the procedure well. The quality                            of the bowel preparation was adequate. The                            ileocecal valve, appendiceal orifice, and rectum                            were photographed. Scope In: 2:29:10 PM Scope Out: 2:54:38 PM Scope Withdrawal Time: 0 hours 12 minutes 29 seconds  Total Procedure Duration: 0 hours 25 minutes 28 seconds  Findings:      2 small AV malformations at cecum and 1 at transverse colon without       stigmata of bled.      6 mm found in the ascending colon. These polyp wasremoved with a hot       snare. Resection and retrieval were complete. The pathology specimen was       placed into Bottle Number 1.      A 8 mm polyp was found in the hepatic flexure. The polyp was       semi-pedunculated. The polyp was removed with a hot snare. Resection and       retrieval were complete. The pathology specimen was placed into Bottle       Number 1.      External hemorrhoids were found  during retroflexion. The hemorrhoids       were small. Impression:               - 3 small nonbleeding AV malformations. 2 at cecum                            and one at transverse colon.                           - Mild portal hypertensive colopathy involing ecum                            and ascending colon.                           - 6 mm polyp in the ascending colon, removed with a                            hot snare. Resected and retrieved.                           - 8 mm polyp at the hepatic flexure, removed with a                             hot snare. Resected and retrieved.                           - External hemorrhoids. Moderate Sedation:      Moderate (conscious) sedation was administered by the endoscopy nurse       and supervised by the endoscopist. The following parameters were       monitored: oxygen saturation, heart rate, blood pressure, CO2       capnography and response to care. Total physician intraservice time was       30 minutes. Recommendation:           - Patient has a contact number available for                            emergencies. The signs and symptoms of potential                            delayed complications were discussed with the                            patient. Return to normal activities tomorrow.                            Written discharge instructions were provided to the                            patient.                           - Resume previous diet today.                           -  Continue present medications.                           - No aspirin, ibuprofen, naproxen, or other                            non-steroidal anti-inflammatory drugs for 10 days                            after polyp removal.                           - Await pathology results.                           - Repeat colonoscopy for surveillance based on                            pathology results. Procedure Code(s):        --- Professional ---                           [phone], Colonoscopy, flexible; with removal of                            tumor(s), polyp(s), or other lesion(s) by snare                            technique                           99152, Moderate sedation services provided by the                            same physician or other qualified health care                            professional performing the diagnostic or                            therapeutic service that the sedation supports,                            requiring the presence of an independent trained                             observer to assist in the monitoring of the                            patient's level of consciousness and physiological                            status; initial 15 minutes of intraservice time,                            patient age 67 years or older  00164, Moderate sedation services; each additional                            15 minutes intraservice time Diagnosis Code(s):        --- Professional ---                           D12.2, Benign neoplasm of ascending colon                           D12.3, Benign neoplasm of transverse colon (hepatic                            flexure or splenic flexure)                           K64.4, Residual hemorrhoidal skin tags                           R19.5, Other fecal abnormalities                           D50.9, Iron deficiency anemia, unspecified CPT copyright 2016 American Medical Association. All rights reserved. The codes documented in this report are preliminary and upon coder review may  be revised to meet current compliance requirements. Hildred Laser, MD Hildred Laser, MD 11/13/2015 3:13:53 PM This report has been signed electronically. Number of Addenda: 0

## 2015-11-14 LAB — AFP TUMOR MARKER: AFP TUMOR MARKER: 0.9 ng/mL (ref 0.0–8.3)

## 2015-11-16 ENCOUNTER — Other Ambulatory Visit (INDEPENDENT_AMBULATORY_CARE_PROVIDER_SITE_OTHER): Payer: Self-pay | Admitting: *Deleted

## 2015-11-16 DIAGNOSIS — D509 Iron deficiency anemia, unspecified: Secondary | ICD-10-CM

## 2015-11-16 DIAGNOSIS — K7460 Unspecified cirrhosis of liver: Secondary | ICD-10-CM

## 2015-11-19 ENCOUNTER — Encounter (HOSPITAL_COMMUNITY): Payer: Self-pay | Admitting: Internal Medicine

## 2015-11-20 ENCOUNTER — Other Ambulatory Visit (INDEPENDENT_AMBULATORY_CARE_PROVIDER_SITE_OTHER): Payer: Self-pay | Admitting: *Deleted

## 2015-11-20 DIAGNOSIS — R195 Other fecal abnormalities: Secondary | ICD-10-CM

## 2015-11-20 DIAGNOSIS — D509 Iron deficiency anemia, unspecified: Secondary | ICD-10-CM

## 2015-12-11 DIAGNOSIS — J449 Chronic obstructive pulmonary disease, unspecified: Secondary | ICD-10-CM | POA: Diagnosis not present

## 2015-12-14 ENCOUNTER — Other Ambulatory Visit (INDEPENDENT_AMBULATORY_CARE_PROVIDER_SITE_OTHER): Payer: Self-pay | Admitting: *Deleted

## 2015-12-14 ENCOUNTER — Encounter (INDEPENDENT_AMBULATORY_CARE_PROVIDER_SITE_OTHER): Payer: Self-pay | Admitting: *Deleted

## 2015-12-14 DIAGNOSIS — R195 Other fecal abnormalities: Secondary | ICD-10-CM

## 2015-12-14 DIAGNOSIS — D509 Iron deficiency anemia, unspecified: Secondary | ICD-10-CM

## 2015-12-14 DIAGNOSIS — K7460 Unspecified cirrhosis of liver: Secondary | ICD-10-CM

## 2016-01-10 DIAGNOSIS — J449 Chronic obstructive pulmonary disease, unspecified: Secondary | ICD-10-CM | POA: Diagnosis not present

## 2016-01-14 DIAGNOSIS — K7460 Unspecified cirrhosis of liver: Secondary | ICD-10-CM | POA: Diagnosis not present

## 2016-01-14 DIAGNOSIS — E039 Hypothyroidism, unspecified: Secondary | ICD-10-CM | POA: Diagnosis not present

## 2016-01-14 DIAGNOSIS — E7800 Pure hypercholesterolemia, unspecified: Secondary | ICD-10-CM | POA: Diagnosis not present

## 2016-01-14 DIAGNOSIS — I10 Essential (primary) hypertension: Secondary | ICD-10-CM | POA: Diagnosis not present

## 2016-01-14 DIAGNOSIS — E1165 Type 2 diabetes mellitus with hyperglycemia: Secondary | ICD-10-CM | POA: Diagnosis not present

## 2016-01-18 DIAGNOSIS — E039 Hypothyroidism, unspecified: Secondary | ICD-10-CM | POA: Diagnosis not present

## 2016-01-18 DIAGNOSIS — E1165 Type 2 diabetes mellitus with hyperglycemia: Secondary | ICD-10-CM | POA: Diagnosis not present

## 2016-01-18 DIAGNOSIS — Z23 Encounter for immunization: Secondary | ICD-10-CM | POA: Diagnosis not present

## 2016-01-18 DIAGNOSIS — I10 Essential (primary) hypertension: Secondary | ICD-10-CM | POA: Diagnosis not present

## 2016-01-18 DIAGNOSIS — E7800 Pure hypercholesterolemia, unspecified: Secondary | ICD-10-CM | POA: Diagnosis not present

## 2016-01-18 DIAGNOSIS — K7460 Unspecified cirrhosis of liver: Secondary | ICD-10-CM | POA: Diagnosis not present

## 2016-02-10 DIAGNOSIS — J449 Chronic obstructive pulmonary disease, unspecified: Secondary | ICD-10-CM | POA: Diagnosis not present

## 2016-03-11 DIAGNOSIS — J449 Chronic obstructive pulmonary disease, unspecified: Secondary | ICD-10-CM | POA: Diagnosis not present

## 2016-03-23 DIAGNOSIS — K430 Incisional hernia with obstruction, without gangrene: Secondary | ICD-10-CM | POA: Diagnosis not present

## 2016-03-24 DIAGNOSIS — J449 Chronic obstructive pulmonary disease, unspecified: Secondary | ICD-10-CM | POA: Diagnosis not present

## 2016-03-24 DIAGNOSIS — I10 Essential (primary) hypertension: Secondary | ICD-10-CM | POA: Diagnosis not present

## 2016-03-24 DIAGNOSIS — E039 Hypothyroidism, unspecified: Secondary | ICD-10-CM | POA: Diagnosis not present

## 2016-03-24 DIAGNOSIS — Z87891 Personal history of nicotine dependence: Secondary | ICD-10-CM | POA: Diagnosis not present

## 2016-03-24 DIAGNOSIS — Z923 Personal history of irradiation: Secondary | ICD-10-CM | POA: Diagnosis not present

## 2016-03-24 DIAGNOSIS — Z7982 Long term (current) use of aspirin: Secondary | ICD-10-CM | POA: Diagnosis not present

## 2016-03-24 DIAGNOSIS — E7800 Pure hypercholesterolemia, unspecified: Secondary | ICD-10-CM | POA: Diagnosis not present

## 2016-03-24 DIAGNOSIS — D649 Anemia, unspecified: Secondary | ICD-10-CM | POA: Diagnosis not present

## 2016-03-24 DIAGNOSIS — Z7984 Long term (current) use of oral hypoglycemic drugs: Secondary | ICD-10-CM | POA: Diagnosis not present

## 2016-03-24 DIAGNOSIS — K7460 Unspecified cirrhosis of liver: Secondary | ICD-10-CM | POA: Diagnosis not present

## 2016-03-24 DIAGNOSIS — Z880 Allergy status to penicillin: Secondary | ICD-10-CM | POA: Diagnosis not present

## 2016-03-24 DIAGNOSIS — Z79899 Other long term (current) drug therapy: Secondary | ICD-10-CM | POA: Diagnosis not present

## 2016-03-24 DIAGNOSIS — E1165 Type 2 diabetes mellitus with hyperglycemia: Secondary | ICD-10-CM | POA: Diagnosis not present

## 2016-03-24 DIAGNOSIS — K430 Incisional hernia with obstruction, without gangrene: Secondary | ICD-10-CM | POA: Diagnosis not present

## 2016-03-24 DIAGNOSIS — D696 Thrombocytopenia, unspecified: Secondary | ICD-10-CM | POA: Diagnosis not present

## 2016-03-25 DIAGNOSIS — K7460 Unspecified cirrhosis of liver: Secondary | ICD-10-CM | POA: Diagnosis not present

## 2016-03-25 DIAGNOSIS — Z880 Allergy status to penicillin: Secondary | ICD-10-CM | POA: Diagnosis not present

## 2016-03-25 DIAGNOSIS — Z87891 Personal history of nicotine dependence: Secondary | ICD-10-CM | POA: Diagnosis not present

## 2016-03-25 DIAGNOSIS — I10 Essential (primary) hypertension: Secondary | ICD-10-CM | POA: Diagnosis not present

## 2016-03-25 DIAGNOSIS — D696 Thrombocytopenia, unspecified: Secondary | ICD-10-CM | POA: Diagnosis not present

## 2016-03-25 DIAGNOSIS — Z7984 Long term (current) use of oral hypoglycemic drugs: Secondary | ICD-10-CM | POA: Diagnosis not present

## 2016-03-25 DIAGNOSIS — Z7982 Long term (current) use of aspirin: Secondary | ICD-10-CM | POA: Diagnosis not present

## 2016-03-25 DIAGNOSIS — E7800 Pure hypercholesterolemia, unspecified: Secondary | ICD-10-CM | POA: Diagnosis not present

## 2016-03-25 DIAGNOSIS — M7989 Other specified soft tissue disorders: Secondary | ICD-10-CM | POA: Diagnosis not present

## 2016-03-25 DIAGNOSIS — E039 Hypothyroidism, unspecified: Secondary | ICD-10-CM | POA: Diagnosis not present

## 2016-03-25 DIAGNOSIS — E1165 Type 2 diabetes mellitus with hyperglycemia: Secondary | ICD-10-CM | POA: Diagnosis not present

## 2016-03-25 DIAGNOSIS — K430 Incisional hernia with obstruction, without gangrene: Secondary | ICD-10-CM | POA: Diagnosis not present

## 2016-03-25 DIAGNOSIS — Z79899 Other long term (current) drug therapy: Secondary | ICD-10-CM | POA: Diagnosis not present

## 2016-03-25 DIAGNOSIS — Z923 Personal history of irradiation: Secondary | ICD-10-CM | POA: Diagnosis not present

## 2016-03-25 DIAGNOSIS — K432 Incisional hernia without obstruction or gangrene: Secondary | ICD-10-CM | POA: Diagnosis not present

## 2016-03-25 DIAGNOSIS — J449 Chronic obstructive pulmonary disease, unspecified: Secondary | ICD-10-CM | POA: Diagnosis not present

## 2016-03-25 DIAGNOSIS — D649 Anemia, unspecified: Secondary | ICD-10-CM | POA: Diagnosis not present

## 2016-03-26 DIAGNOSIS — K430 Incisional hernia with obstruction, without gangrene: Secondary | ICD-10-CM | POA: Diagnosis not present

## 2016-03-26 DIAGNOSIS — D696 Thrombocytopenia, unspecified: Secondary | ICD-10-CM | POA: Diagnosis not present

## 2016-03-26 DIAGNOSIS — D649 Anemia, unspecified: Secondary | ICD-10-CM | POA: Diagnosis not present

## 2016-03-26 DIAGNOSIS — K432 Incisional hernia without obstruction or gangrene: Secondary | ICD-10-CM | POA: Diagnosis not present

## 2016-03-26 DIAGNOSIS — E039 Hypothyroidism, unspecified: Secondary | ICD-10-CM | POA: Diagnosis not present

## 2016-03-26 DIAGNOSIS — Z87891 Personal history of nicotine dependence: Secondary | ICD-10-CM | POA: Diagnosis not present

## 2016-03-26 DIAGNOSIS — J449 Chronic obstructive pulmonary disease, unspecified: Secondary | ICD-10-CM | POA: Diagnosis not present

## 2016-03-26 DIAGNOSIS — Z7984 Long term (current) use of oral hypoglycemic drugs: Secondary | ICD-10-CM | POA: Diagnosis not present

## 2016-03-26 DIAGNOSIS — Z7982 Long term (current) use of aspirin: Secondary | ICD-10-CM | POA: Diagnosis not present

## 2016-03-26 DIAGNOSIS — Z79899 Other long term (current) drug therapy: Secondary | ICD-10-CM | POA: Diagnosis not present

## 2016-03-26 DIAGNOSIS — Z880 Allergy status to penicillin: Secondary | ICD-10-CM | POA: Diagnosis not present

## 2016-03-26 DIAGNOSIS — E7800 Pure hypercholesterolemia, unspecified: Secondary | ICD-10-CM | POA: Diagnosis not present

## 2016-03-26 DIAGNOSIS — K7460 Unspecified cirrhosis of liver: Secondary | ICD-10-CM | POA: Diagnosis not present

## 2016-03-26 DIAGNOSIS — Z923 Personal history of irradiation: Secondary | ICD-10-CM | POA: Diagnosis not present

## 2016-03-26 DIAGNOSIS — E1165 Type 2 diabetes mellitus with hyperglycemia: Secondary | ICD-10-CM | POA: Diagnosis not present

## 2016-03-26 DIAGNOSIS — I10 Essential (primary) hypertension: Secondary | ICD-10-CM | POA: Diagnosis not present

## 2016-04-11 DIAGNOSIS — J449 Chronic obstructive pulmonary disease, unspecified: Secondary | ICD-10-CM | POA: Diagnosis not present

## 2016-04-19 ENCOUNTER — Encounter (INDEPENDENT_AMBULATORY_CARE_PROVIDER_SITE_OTHER): Payer: Self-pay | Admitting: *Deleted

## 2016-04-26 ENCOUNTER — Ambulatory Visit (INDEPENDENT_AMBULATORY_CARE_PROVIDER_SITE_OTHER): Payer: Medicare Other | Admitting: Internal Medicine

## 2016-05-12 DIAGNOSIS — J449 Chronic obstructive pulmonary disease, unspecified: Secondary | ICD-10-CM | POA: Diagnosis not present

## 2016-05-16 DIAGNOSIS — D649 Anemia, unspecified: Secondary | ICD-10-CM | POA: Insufficient documentation

## 2016-05-17 DIAGNOSIS — E039 Hypothyroidism, unspecified: Secondary | ICD-10-CM | POA: Diagnosis not present

## 2016-05-17 DIAGNOSIS — E7800 Pure hypercholesterolemia, unspecified: Secondary | ICD-10-CM | POA: Diagnosis not present

## 2016-05-17 DIAGNOSIS — D649 Anemia, unspecified: Secondary | ICD-10-CM | POA: Diagnosis not present

## 2016-05-17 DIAGNOSIS — I10 Essential (primary) hypertension: Secondary | ICD-10-CM | POA: Diagnosis not present

## 2016-05-17 DIAGNOSIS — E1165 Type 2 diabetes mellitus with hyperglycemia: Secondary | ICD-10-CM | POA: Diagnosis not present

## 2016-05-19 DIAGNOSIS — K7460 Unspecified cirrhosis of liver: Secondary | ICD-10-CM | POA: Diagnosis present

## 2016-05-19 DIAGNOSIS — Z6832 Body mass index (BMI) 32.0-32.9, adult: Secondary | ICD-10-CM | POA: Insufficient documentation

## 2016-05-19 DIAGNOSIS — K432 Incisional hernia without obstruction or gangrene: Secondary | ICD-10-CM | POA: Insufficient documentation

## 2016-05-19 DIAGNOSIS — R972 Elevated prostate specific antigen [PSA]: Secondary | ICD-10-CM | POA: Diagnosis not present

## 2016-05-19 DIAGNOSIS — D500 Iron deficiency anemia secondary to blood loss (chronic): Secondary | ICD-10-CM | POA: Insufficient documentation

## 2016-05-19 DIAGNOSIS — I8500 Esophageal varices without bleeding: Secondary | ICD-10-CM | POA: Insufficient documentation

## 2016-05-20 DIAGNOSIS — Z1389 Encounter for screening for other disorder: Secondary | ICD-10-CM | POA: Diagnosis not present

## 2016-05-20 DIAGNOSIS — E039 Hypothyroidism, unspecified: Secondary | ICD-10-CM | POA: Diagnosis not present

## 2016-05-20 DIAGNOSIS — I10 Essential (primary) hypertension: Secondary | ICD-10-CM | POA: Diagnosis not present

## 2016-05-20 DIAGNOSIS — E1165 Type 2 diabetes mellitus with hyperglycemia: Secondary | ICD-10-CM | POA: Diagnosis not present

## 2016-05-20 DIAGNOSIS — E7800 Pure hypercholesterolemia, unspecified: Secondary | ICD-10-CM | POA: Diagnosis not present

## 2016-05-20 DIAGNOSIS — K7460 Unspecified cirrhosis of liver: Secondary | ICD-10-CM | POA: Diagnosis not present

## 2016-06-09 DIAGNOSIS — J449 Chronic obstructive pulmonary disease, unspecified: Secondary | ICD-10-CM | POA: Diagnosis not present

## 2016-07-10 DIAGNOSIS — J449 Chronic obstructive pulmonary disease, unspecified: Secondary | ICD-10-CM | POA: Diagnosis not present

## 2016-08-05 ENCOUNTER — Ambulatory Visit (INDEPENDENT_AMBULATORY_CARE_PROVIDER_SITE_OTHER): Payer: Medicare Other | Admitting: Urology

## 2016-08-05 DIAGNOSIS — Z8546 Personal history of malignant neoplasm of prostate: Secondary | ICD-10-CM | POA: Diagnosis not present

## 2016-08-05 DIAGNOSIS — N480 Leukoplakia of penis: Secondary | ICD-10-CM

## 2016-08-05 DIAGNOSIS — N471 Phimosis: Secondary | ICD-10-CM | POA: Diagnosis not present

## 2016-08-09 DIAGNOSIS — J449 Chronic obstructive pulmonary disease, unspecified: Secondary | ICD-10-CM | POA: Diagnosis not present

## 2016-09-09 DIAGNOSIS — J449 Chronic obstructive pulmonary disease, unspecified: Secondary | ICD-10-CM | POA: Diagnosis not present

## 2016-09-19 DIAGNOSIS — E7800 Pure hypercholesterolemia, unspecified: Secondary | ICD-10-CM | POA: Diagnosis not present

## 2016-09-19 DIAGNOSIS — E1165 Type 2 diabetes mellitus with hyperglycemia: Secondary | ICD-10-CM | POA: Diagnosis not present

## 2016-09-19 DIAGNOSIS — E039 Hypothyroidism, unspecified: Secondary | ICD-10-CM | POA: Diagnosis not present

## 2016-09-19 DIAGNOSIS — I10 Essential (primary) hypertension: Secondary | ICD-10-CM | POA: Diagnosis not present

## 2016-09-19 DIAGNOSIS — D649 Anemia, unspecified: Secondary | ICD-10-CM | POA: Diagnosis not present

## 2016-09-22 DIAGNOSIS — E039 Hypothyroidism, unspecified: Secondary | ICD-10-CM | POA: Diagnosis not present

## 2016-09-22 DIAGNOSIS — I10 Essential (primary) hypertension: Secondary | ICD-10-CM | POA: Diagnosis not present

## 2016-09-22 DIAGNOSIS — Z0000 Encounter for general adult medical examination without abnormal findings: Secondary | ICD-10-CM | POA: Insufficient documentation

## 2016-09-22 DIAGNOSIS — E1165 Type 2 diabetes mellitus with hyperglycemia: Secondary | ICD-10-CM | POA: Diagnosis not present

## 2016-09-22 DIAGNOSIS — Z0001 Encounter for general adult medical examination with abnormal findings: Secondary | ICD-10-CM | POA: Diagnosis not present

## 2016-09-22 DIAGNOSIS — E7800 Pure hypercholesterolemia, unspecified: Secondary | ICD-10-CM | POA: Diagnosis not present

## 2016-10-09 DIAGNOSIS — J449 Chronic obstructive pulmonary disease, unspecified: Secondary | ICD-10-CM | POA: Diagnosis not present

## 2016-10-12 DIAGNOSIS — R42 Dizziness and giddiness: Secondary | ICD-10-CM | POA: Diagnosis not present

## 2016-10-12 DIAGNOSIS — D500 Iron deficiency anemia secondary to blood loss (chronic): Secondary | ICD-10-CM | POA: Diagnosis not present

## 2016-10-12 DIAGNOSIS — K625 Hemorrhage of anus and rectum: Secondary | ICD-10-CM | POA: Diagnosis not present

## 2016-10-20 ENCOUNTER — Telehealth (INDEPENDENT_AMBULATORY_CARE_PROVIDER_SITE_OTHER): Payer: Self-pay | Admitting: *Deleted

## 2016-10-20 NOTE — Telephone Encounter (Signed)
Our office rec'd referral for this patient whom ,is a patient of ours, from his PCP Tyler Farmer. The patient had lab work there on 09/19/2016 with a HGB of 9.1. On 10/12/2016 the HGB was 7.9 and Ferritin was 12.  I called the PCP's office and spoke with Tyler Farmer, she confirmed that a Blood Transfusion had not been done . Tyler Farmer noted on the lab work drawn on 10/12/2016  Patient was dizzy because HGB has dropped even further. He ask that the patient take Iron twice a day with Orange ?, And that he probably need to get him back in with Tyler Farmer.  This was discussed with Tyler Farmer. He states that the patient should go to the ED if he is still dizzy. Other wise , we will see the patient on 10/24/2016 as has been arranged. I called the patient's number and left this message on their voicemail. I also ask that they please call our office back for confirmation of this message.

## 2016-10-20 NOTE — Telephone Encounter (Signed)
Patient's wife called back and she states that her husband was not dizzy right now but is very short of breathe. He does have O2 at night. She says that Dr.Sasser on 10/12/2016 told him to take 2 Iron pills daily. She plans to take him to one of the ED's either [facility] or to [facility].

## 2016-10-24 ENCOUNTER — Telehealth: Payer: Self-pay | Admitting: Internal Medicine

## 2016-10-24 ENCOUNTER — Encounter (INDEPENDENT_AMBULATORY_CARE_PROVIDER_SITE_OTHER): Payer: Self-pay

## 2016-10-24 ENCOUNTER — Ambulatory Visit (INDEPENDENT_AMBULATORY_CARE_PROVIDER_SITE_OTHER): Payer: Medicare Other | Admitting: Internal Medicine

## 2016-10-24 ENCOUNTER — Observation Stay (HOSPITAL_COMMUNITY)
Admission: EM | Admit: 2016-10-24 | Discharge: 2016-10-26 | Disposition: A | Discharge status: Still In Hospital | Payer: Medicare Other | Attending: Internal Medicine | Admitting: Internal Medicine

## 2016-10-24 ENCOUNTER — Emergency Department (HOSPITAL_COMMUNITY): Payer: Medicare Other

## 2016-10-24 ENCOUNTER — Encounter (INDEPENDENT_AMBULATORY_CARE_PROVIDER_SITE_OTHER): Payer: Self-pay | Admitting: Internal Medicine

## 2016-10-24 ENCOUNTER — Encounter (HOSPITAL_COMMUNITY): Payer: Self-pay | Admitting: Emergency Medicine

## 2016-10-24 VITALS — BP 108/50 | HR 76 | Temp 98.2°F | Ht 66.0 in | Wt 220.5 lb

## 2016-10-24 DIAGNOSIS — Z880 Allergy status to penicillin: Secondary | ICD-10-CM | POA: Insufficient documentation

## 2016-10-24 DIAGNOSIS — E7800 Pure hypercholesterolemia, unspecified: Secondary | ICD-10-CM | POA: Diagnosis not present

## 2016-10-24 DIAGNOSIS — Z823 Family history of stroke: Secondary | ICD-10-CM | POA: Insufficient documentation

## 2016-10-24 DIAGNOSIS — D649 Anemia, unspecified: Secondary | ICD-10-CM | POA: Diagnosis not present

## 2016-10-24 DIAGNOSIS — K766 Portal hypertension: Secondary | ICD-10-CM | POA: Diagnosis not present

## 2016-10-24 DIAGNOSIS — Z7984 Long term (current) use of oral hypoglycemic drugs: Secondary | ICD-10-CM | POA: Insufficient documentation

## 2016-10-24 DIAGNOSIS — K7460 Unspecified cirrhosis of liver: Secondary | ICD-10-CM | POA: Insufficient documentation

## 2016-10-24 DIAGNOSIS — D500 Iron deficiency anemia secondary to blood loss (chronic): Secondary | ICD-10-CM | POA: Diagnosis not present

## 2016-10-24 DIAGNOSIS — K3189 Other diseases of stomach and duodenum: Secondary | ICD-10-CM | POA: Insufficient documentation

## 2016-10-24 DIAGNOSIS — E079 Disorder of thyroid, unspecified: Secondary | ICD-10-CM | POA: Insufficient documentation

## 2016-10-24 DIAGNOSIS — Z86010 Personal history of colonic polyps: Secondary | ICD-10-CM | POA: Insufficient documentation

## 2016-10-24 DIAGNOSIS — D509 Iron deficiency anemia, unspecified: Secondary | ICD-10-CM | POA: Diagnosis present

## 2016-10-24 DIAGNOSIS — Z8546 Personal history of malignant neoplasm of prostate: Secondary | ICD-10-CM | POA: Diagnosis not present

## 2016-10-24 DIAGNOSIS — K922 Gastrointestinal hemorrhage, unspecified: Secondary | ICD-10-CM | POA: Diagnosis not present

## 2016-10-24 DIAGNOSIS — Z87891 Personal history of nicotine dependence: Secondary | ICD-10-CM | POA: Diagnosis not present

## 2016-10-24 DIAGNOSIS — Q2730 Arteriovenous malformation, site unspecified: Secondary | ICD-10-CM

## 2016-10-24 DIAGNOSIS — I10 Essential (primary) hypertension: Secondary | ICD-10-CM | POA: Diagnosis not present

## 2016-10-24 DIAGNOSIS — Z79899 Other long term (current) drug therapy: Secondary | ICD-10-CM | POA: Diagnosis not present

## 2016-10-24 DIAGNOSIS — Z87442 Personal history of urinary calculi: Secondary | ICD-10-CM | POA: Insufficient documentation

## 2016-10-24 DIAGNOSIS — K219 Gastro-esophageal reflux disease without esophagitis: Secondary | ICD-10-CM | POA: Diagnosis not present

## 2016-10-24 DIAGNOSIS — E1165 Type 2 diabetes mellitus with hyperglycemia: Secondary | ICD-10-CM | POA: Insufficient documentation

## 2016-10-24 DIAGNOSIS — E538 Deficiency of other specified B group vitamins: Secondary | ICD-10-CM | POA: Insufficient documentation

## 2016-10-24 DIAGNOSIS — N400 Enlarged prostate without lower urinary tract symptoms: Secondary | ICD-10-CM | POA: Insufficient documentation

## 2016-10-24 DIAGNOSIS — D62 Acute posthemorrhagic anemia: Secondary | ICD-10-CM | POA: Diagnosis not present

## 2016-10-24 DIAGNOSIS — K921 Melena: Secondary | ICD-10-CM

## 2016-10-24 DIAGNOSIS — Z836 Family history of other diseases of the respiratory system: Secondary | ICD-10-CM | POA: Insufficient documentation

## 2016-10-24 DIAGNOSIS — Z7982 Long term (current) use of aspirin: Secondary | ICD-10-CM | POA: Diagnosis not present

## 2016-10-24 DIAGNOSIS — E785 Hyperlipidemia, unspecified: Secondary | ICD-10-CM | POA: Diagnosis present

## 2016-10-24 DIAGNOSIS — I8500 Esophageal varices without bleeding: Secondary | ICD-10-CM | POA: Insufficient documentation

## 2016-10-24 DIAGNOSIS — R531 Weakness: Secondary | ICD-10-CM | POA: Diagnosis not present

## 2016-10-24 DIAGNOSIS — N281 Cyst of kidney, acquired: Secondary | ICD-10-CM | POA: Insufficient documentation

## 2016-10-24 DIAGNOSIS — Z9889 Other specified postprocedural states: Secondary | ICD-10-CM | POA: Insufficient documentation

## 2016-10-24 DIAGNOSIS — D696 Thrombocytopenia, unspecified: Secondary | ICD-10-CM | POA: Insufficient documentation

## 2016-10-24 DIAGNOSIS — Z8719 Personal history of other diseases of the digestive system: Secondary | ICD-10-CM | POA: Diagnosis not present

## 2016-10-24 DIAGNOSIS — E119 Type 2 diabetes mellitus without complications: Secondary | ICD-10-CM

## 2016-10-24 DIAGNOSIS — J449 Chronic obstructive pulmonary disease, unspecified: Secondary | ICD-10-CM | POA: Diagnosis not present

## 2016-10-24 DIAGNOSIS — K259 Gastric ulcer, unspecified as acute or chronic, without hemorrhage or perforation: Secondary | ICD-10-CM | POA: Diagnosis present

## 2016-10-24 DIAGNOSIS — Z8249 Family history of ischemic heart disease and other diseases of the circulatory system: Secondary | ICD-10-CM | POA: Insufficient documentation

## 2016-10-24 DIAGNOSIS — Z833 Family history of diabetes mellitus: Secondary | ICD-10-CM | POA: Insufficient documentation

## 2016-10-24 HISTORY — DX: Deficiency of other specified B group vitamins: E53.8

## 2016-10-24 HISTORY — DX: Gastrointestinal hemorrhage, unspecified: K92.2

## 2016-10-24 LAB — COMPREHENSIVE METABOLIC PANEL
ALBUMIN: 3.4 g/dL — AB (ref 3.5–5.0)
ALK PHOS: 72 U/L (ref 38–126)
ALT: 17 U/L (ref 17–63)
AST: 26 U/L (ref 15–41)
Anion gap: 9 (ref 5–15)
BUN: 18 mg/dL (ref 6–20)
CALCIUM: 8.8 mg/dL — AB (ref 8.9–10.3)
CHLORIDE: 107 mmol/L (ref 101–111)
CO2: 23 mmol/L (ref 22–32)
CREATININE: 0.97 mg/dL (ref 0.61–1.24)
GFR calc Af Amer: >60 mL/min (ref >60)
GFR calc non Af Amer: >60 mL/min (ref >60)
GLUCOSE: 228 mg/dL — AB (ref 65–99)
Potassium: 4.2 mmol/L (ref 3.5–5.1)
SODIUM: 139 mmol/L (ref 135–145)
Total Bilirubin: 0.8 mg/dL (ref 0.3–1.2)
Total Protein: 6.6 g/dL (ref 6.5–8.1)

## 2016-10-24 LAB — CBC WITH DIFFERENTIAL/PLATELET
BASOS PCT: 1 %
BASOS PCT: 1 %
Basophils Absolute: 36 cells/uL (ref 0–200)
Basophils Absolute: 0 10*3/uL (ref 0.0–0.1)
EOS ABS: 0.3 10*3/uL (ref 0.0–0.7)
EOS PCT: 6 %
Eosinophils Absolute: 216 cells/uL (ref 15–500)
Eosinophils Relative: 8 %
HCT: 18.7 % — ABNORMAL LOW (ref 38.5–50.0)
HCT: 17.9 % — ABNORMAL LOW (ref 39.0–52.0)
HEMOGLOBIN: 5.8 g/dL — AB (ref 13.2–17.1)
HEMOGLOBIN: 5.3 g/dL — AB (ref 13.0–17.0)
LYMPHS ABS: 684 {cells}/uL — AB (ref 850–3900)
LYMPHS ABS: 0.8 10*3/uL (ref 0.7–4.0)
Lymphocytes Relative: 19 %
Lymphocytes Relative: 22 %
MCH: 26 pg — ABNORMAL LOW (ref 27.0–33.0)
MCH: 26.4 pg (ref 26.0–34.0)
MCHC: 31 g/dL — ABNORMAL LOW (ref 32.0–36.0)
MCHC: 29.6 g/dL — ABNORMAL LOW (ref 30.0–36.0)
MCV: 83.9 fL (ref 80.0–100.0)
MCV: 89.1 fL (ref 78.0–100.0)
MPV: 9.9 fL (ref 7.5–12.5)
Monocytes Absolute: 360 cells/uL (ref 200–950)
Monocytes Absolute: 0.4 10*3/uL (ref 0.1–1.0)
Monocytes Relative: 10 %
Monocytes Relative: 12 %
NEUTROS ABS: 2304 {cells}/uL (ref 1500–7800)
NEUTROS ABS: 2.1 10*3/uL (ref 1.7–7.7)
NEUTROS PCT: 57 %
Neutrophils Relative %: 64 %
Platelets: 136 10*3/uL — ABNORMAL LOW (ref 140–400)
Platelets: 113 10*3/uL — ABNORMAL LOW (ref 150–400)
RBC: 2.23 MIL/uL — AB (ref 4.20–5.80)
RBC: 2.01 MIL/uL — AB (ref 4.22–5.81)
RDW: 17.6 % — ABNORMAL HIGH (ref 11.0–15.0)
RDW: 17.5 % — ABNORMAL HIGH (ref 11.5–15.5)
WBC: 3.6 10*3/uL — AB (ref 3.8–10.8)
WBC: 3.6 10*3/uL — AB (ref 4.0–10.5)

## 2016-10-24 LAB — PROTIME-INR
INR: 1.19
PROTHROMBIN TIME: 15.1 s (ref 11.4–15.2)

## 2016-10-24 LAB — POC OCCULT BLOOD, ED: Fecal Occult Bld: NEGATIVE

## 2016-10-24 LAB — ABO/RH: ABO/RH(D): O POS

## 2016-10-24 LAB — TROPONIN I: Troponin I: <0.03 ng/mL (ref <0.03)

## 2016-10-24 LAB — PREPARE RBC (CROSSMATCH)

## 2016-10-24 MED ORDER — SODIUM CHLORIDE 0.9 % IV SOLN: 10.0000 mL/h | Freq: Once | INTRAVENOUS | Status: DC

## 2016-10-24 NOTE — Patient Instructions (Addendum)
CBC. Will discuss with Dr. Laural Golden

## 2016-10-24 NOTE — ED Notes (Signed)
Patient transported to X-ray 

## 2016-10-24 NOTE — ED Provider Notes (Signed)
[facility] DEPT Provider Note   CSN: [id number] Arrival date & time: 10/24/16  2051     History   Chief Complaint Chief Complaint  Patient presents with  . Abnormal Lab    HPI Tyler Farmer is a 76 y.o. male.Complains of generalized weakness for approximately one week. Seen by Louis Matte, NP earlier today determined to have a hemoglobin of 5.8. Dr. Gala Romney notified from office and sent patient here for further evaluation and admission. Patient denies pain anywhere. Denies blood per rectum though he states he's had blood per rectum several days ago. Lightheadedness is worse with standing and improved with remaining still  HPI  Past Medical History:  Diagnosis Date  . Arthritis    Hands/Fingers  . Back pain   . Diabetes mellitus ([place])   . Enlarged prostate   . Gastric ulcer   . GERD (gastroesophageal reflux disease)   . H/O bladder problems   . Hypercholesteremia   . Hypertension   . Kidney stone   . Prostate cancer ([place])   . Thyroid disease     Patient Active Problem List   Diagnosis Date Noted  . IDA (iron deficiency anemia) 10/23/2015  . Heme positive stool 10/23/2015  . Cirrhosis of liver without ascites ([place]) 10/23/2015  . Cirrhosis ([place])   . Hyperlipidemia 05/27/2014  . HTN (hypertension) 05/27/2014  . DM (diabetes mellitus) ([place]) 05/27/2014  . Hepatic cirrhosis ([place]) 05/27/2014  . COPD (chronic obstructive pulmonary disease) ([place]) 05/27/2014    Past Surgical History:  Procedure Laterality Date  . CIRCUMCISION     at age 68  . COLONOSCOPY N/A 11/13/2015   Procedure: COLONOSCOPY;  Surgeon: Rogene Houston, MD;  Location: [facility];  Service: Endoscopy;  Laterality: N/A;  2:10  . ESOPHAGOGASTRODUODENOSCOPY N/A 06/03/2014   Procedure: ESOPHAGOGASTRODUODENOSCOPY (EGD);  Surgeon: Rogene Houston, MD;  Location: [facility];  Service: Endoscopy;  Laterality: N/A;  730  . Gastric Ulcer  1993  . POLYPECTOMY  11/13/2015   Procedure: POLYPECTOMY;   Surgeon: Rogene Houston, MD;  Location: [facility];  Service: Endoscopy;;  colon  . Right Elbow Right    A pin was put in       Home Medications    Prior to Admission medications   Medication Sig Start Date End Date Taking? Authorizing Provider  aspirin EC 81 MG tablet Take 1 tablet (81 mg total) by mouth daily. 11/24/15   Rehman, Mechele Dawley, MD  ferrous sulfate 325 (65 FE) MG EC tablet Take 65 mg by mouth 2 (two) times daily.    [provider]  glipiZIDE (GLUCOTROL) 10 MG tablet Take 10 mg by mouth 2 (two) times daily before a meal.    [provider]  levothyroxine (SYNTHROID, LEVOTHROID) 137 MCG tablet Take 137 mcg by mouth daily before breakfast.  04/04/15   [provider]  lisinopril (PRINIVIL,ZESTRIL) 20 MG tablet Take 20 mg by mouth daily.    [provider]  metFORMIN (GLUCOPHAGE) 850 MG tablet Take 850 mg by mouth 3 (three) times daily with meals.    [provider]  simvastatin (ZOCOR) 40 MG tablet Take 40 mg by mouth at bedtime.     [provider]    Family History Family History  Problem Relation Age of Onset  . Emphysema Mother   . Cerebrovascular Accident Father   . Heart disease Father   . Diabetes Sister   . Heart disease Brother   . Heart disease  Sister   . Heart disease Brother   . Diabetes Brother     Social History Social History  Substance Use Topics  . Smoking status: Former Smoker    Quit date: 05/25/1989  . Smokeless tobacco: Never Used  . Alcohol use No     Comment: Drank many years - 30 years ago     Allergies   Penicillins   Review of Systems Review of Systems  HENT: Negative.   Respiratory: Negative.   Cardiovascular: Negative.   Gastrointestinal: Positive for blood in stool.       Blood per rectum several days ago  Musculoskeletal: Negative.   Skin: Negative.   Neurological: Positive for weakness.       Generalized weakness  Psychiatric/Behavioral: Negative.   All other  systems reviewed and are negative.    Physical Exam Updated Vital Signs BP (!) 153/68   Pulse (!) 104   Temp 98.2 F (36.8 C)   Resp 20   Ht 5\' 8"  (1.727 m)   Wt 99.8 kg (220 lb)   SpO2 98%   BMI 33.45 kg/m   Physical Exam  Constitutional: He appears well-developed and well-nourished. No distress.  Chronically ill-appearing  HENT:  Head: Normocephalic and atraumatic.  Eyes: Pupils are equal, round, and reactive to light. Conjunctivae are normal.  Neck: Neck supple. No tracheal deviation present. No thyromegaly present.  Cardiovascular: Normal rate and regular rhythm.   No murmur heard. Pulmonary/Chest: Effort normal and breath sounds normal.  Abdominal: Soft. Bowel sounds are normal. He exhibits no distension. There is no tenderness.  Genitourinary: Rectum normal. Rectal exam shows guaiac negative stool.  Genitourinary Comments: Rectal normal tone brown stool no gross blood  Musculoskeletal: Normal range of motion. He exhibits no edema or tenderness.  Neurological: He is alert. Coordination normal.  Skin: Skin is warm and dry. No rash noted.  Psychiatric: He has a normal mood and affect.  Nursing note and vitals reviewed.    ED Treatments / Results  Labs (all labs ordered are listed, but only abnormal results are displayed) Labs Reviewed  COMPREHENSIVE METABOLIC PANEL  TROPONIN I  CBC WITH DIFFERENTIAL/PLATELET  PROTIME-INR  URINALYSIS, ROUTINE W REFLEX MICROSCOPIC  POC OCCULT BLOOD, ED  POC OCCULT BLOOD, ED  TYPE AND SCREEN    EKG  EKG Interpretation  Date/Time:  Monday October 24 2016 22:01:52 EDT Ventricular Rate:  98 PR Interval:    QRS Duration: 86 QT Interval:  350 QTC Calculation: 447 R Axis:   65 Text Interpretation:  Sinus arrhythmia Abnormal R-wave progression, early transition Nonspecific repol abnormality, diffuse leads No old tracing to compare Confirmed by [place], Inocente Salles [phone]) on 10/24/2016 10:38:22 PM       Radiology Dg Chest 2  View  Result Date: 10/24/2016 CLINICAL DATA:  Weakness and anemia. EXAM: CHEST  2 VIEW COMPARISON:  Prior chest x-ray on 02/06/2011 at [facility]. FINDINGS: The heart size and mediastinal contours are within normal limits. Mild pulmonary venous hypertensive changes without overt airspace edema or pleural fluid. No nodules or pneumothorax identified. The visualized skeletal structures are unremarkable. IMPRESSION: Pulmonary venous hypertensive changes without overt edema. Electronically Signed   By: Aletta Edouard M.D.   On: 10/24/2016 21:56    Procedures Procedures (including critical care time)  Medications Ordered in ED Medications - No data to display  Results for orders placed or performed during the hospital encounter of 10/24/16  Comprehensive metabolic panel  Result Value Ref Range   Sodium 139 135 -  145 mmol/L   Potassium 4.2 3.5 - 5.1 mmol/L   Chloride 107 101 - 111 mmol/L   CO2 23 22 - 32 mmol/L   Glucose, Bld 228 (H) 65 - 99 mg/dL   BUN 18 6 - 20 mg/dL   Creatinine, Ser 0.97 0.61 - 1.24 mg/dL   Calcium 8.8 (L) 8.9 - 10.3 mg/dL   Total Protein 6.6 6.5 - 8.1 g/dL   Albumin 3.4 (L) 3.5 - 5.0 g/dL   AST 26 15 - 41 U/L   ALT 17 17 - 63 U/L   Alkaline Phosphatase 72 38 - 126 U/L   Total Bilirubin 0.8 0.3 - 1.2 mg/dL   GFR calc non Af Amer >60 >60 mL/min   GFR calc Af Amer >60 >60 mL/min   Anion gap 9 5 - 15  Troponin I  Result Value Ref Range   Troponin I <0.03 <0.03 ng/mL  CBC with Differential  Result Value Ref Range   WBC 3.6 (L) 4.0 - 10.5 K/uL   RBC 2.01 (L) 4.22 - 5.81 MIL/uL   Hemoglobin 5.3 (LL) 13.0 - 17.0 g/dL   HCT 17.9 (L) 39.0 - 52.0 %   MCV 89.1 78.0 - 100.0 fL   MCH 26.4 26.0 - 34.0 pg   MCHC 29.6 (L) 30.0 - 36.0 g/dL   RDW 17.5 (H) 11.5 - 15.5 %   Platelets 113 (L) 150 - 400 K/uL   Neutrophils Relative % 57 %   Neutro Abs 2.1 1.7 - 7.7 K/uL   Lymphocytes Relative 22 %   Lymphs Abs 0.8 0.7 - 4.0 K/uL   Monocytes Relative 12 %   Monocytes  Absolute 0.4 0.1 - 1.0 K/uL   Eosinophils Relative 8 %   Eosinophils Absolute 0.3 0.0 - 0.7 K/uL   Basophils Relative 1 %   Basophils Absolute 0.0 0.0 - 0.1 K/uL  Protime-INR  Result Value Ref Range   Prothrombin Time 15.1 11.4 - 15.2 seconds   INR 1.19   POC occult blood, ED  Result Value Ref Range   Fecal Occult Bld NEGATIVE NEGATIVE  Type and screen [facility]  Result Value Ref Range   ABO/RH(D) O POS    Antibody Screen PENDING    Sample Expiration 10/27/2016   Prepare RBC  Result Value Ref Range   Order Confirmation ORDER PROCESSED BY BLOOD BANK   ABO/Rh  Result Value Ref Range   ABO/RH(D) O POS    Dg Chest 2 View  Result Date: 10/24/2016 CLINICAL DATA:  Weakness and anemia. EXAM: CHEST  2 VIEW COMPARISON:  Prior chest x-ray on 02/06/2011 at [facility] FINDINGS: The heart size and mediastinal contours are within normal limits. Mild pulmonary venous hypertensive changes without overt airspace edema or pleural fluid. No nodules or pneumothorax identified. The visualized skeletal structures are unremarkable. IMPRESSION: Pulmonary venous hypertensive changes without overt edema. Electronically Signed   By: Aletta Edouard M.D.   On: 10/24/2016 21:56   Results for orders placed or performed during the hospital encounter of 10/24/16  Comprehensive metabolic panel  Result Value Ref Range   Sodium 139 135 - 145 mmol/L   Potassium 4.2 3.5 - 5.1 mmol/L   Chloride 107 101 - 111 mmol/L   CO2 23 22 - 32 mmol/L   Glucose, Bld 228 (H) 65 - 99 mg/dL   BUN 18 6 - 20 mg/dL   Creatinine, Ser 0.97 0.61 - 1.24 mg/dL   Calcium 8.8 (L) 8.9 - 10.3 mg/dL  Total Protein 6.6 6.5 - 8.1 g/dL   Albumin 3.4 (L) 3.5 - 5.0 g/dL   AST 26 15 - 41 U/L   ALT 17 17 - 63 U/L   Alkaline Phosphatase 72 38 - 126 U/L   Total Bilirubin 0.8 0.3 - 1.2 mg/dL   GFR calc non Af Amer >60 >60 mL/min   GFR calc Af Amer >60 >60 mL/min   Anion gap 9 5 - 15  Troponin I  Result Value Ref Range    Troponin I <0.03 <0.03 ng/mL  CBC with Differential  Result Value Ref Range   WBC 3.6 (L) 4.0 - 10.5 K/uL   RBC 2.01 (L) 4.22 - 5.81 MIL/uL   Hemoglobin 5.3 (LL) 13.0 - 17.0 g/dL   HCT 17.9 (L) 39.0 - 52.0 %   MCV 89.1 78.0 - 100.0 fL   MCH 26.4 26.0 - 34.0 pg   MCHC 29.6 (L) 30.0 - 36.0 g/dL   RDW 17.5 (H) 11.5 - 15.5 %   Platelets 113 (L) 150 - 400 K/uL   Neutrophils Relative % 57 %   Neutro Abs 2.1 1.7 - 7.7 K/uL   Lymphocytes Relative 22 %   Lymphs Abs 0.8 0.7 - 4.0 K/uL   Monocytes Relative 12 %   Monocytes Absolute 0.4 0.1 - 1.0 K/uL   Eosinophils Relative 8 %   Eosinophils Absolute 0.3 0.0 - 0.7 K/uL   Basophils Relative 1 %   Basophils Absolute 0.0 0.0 - 0.1 K/uL  Protime-INR  Result Value Ref Range   Prothrombin Time 15.1 11.4 - 15.2 seconds   INR 1.19   POC occult blood, ED  Result Value Ref Range   Fecal Occult Bld NEGATIVE NEGATIVE  Type and screen [facility]  Result Value Ref Range   ABO/RH(D) O POS    Antibody Screen PENDING    Sample Expiration 10/27/2016   Prepare RBC  Result Value Ref Range   Order Confirmation ORDER PROCESSED BY BLOOD BANK   ABO/Rh  Result Value Ref Range   ABO/RH(D) O POS    Dg Chest 2 View  Result Date: 10/24/2016 CLINICAL DATA:  Weakness and anemia. EXAM: CHEST  2 VIEW COMPARISON:  Prior chest x-ray on 02/06/2011 at [facility]. FINDINGS: The heart size and mediastinal contours are within normal limits. Mild pulmonary venous hypertensive changes without overt airspace edema or pleural fluid. No nodules or pneumothorax identified. The visualized skeletal structures are unremarkable. IMPRESSION: Pulmonary venous hypertensive changes without overt edema. Electronically Signed   By: Aletta Edouard M.D.   On: 10/24/2016 21:56   Initial Impression / Assessment and Plan / ED Course  I have reviewed the triage vital signs and the nursing notes.  Pertinent labs & imaging results that were available during my care of the  patient were reviewed by me and considered in my medical decision making (see chart for details).     2 units packed red cells ordered to be transfused emergently as patient has symptomatic anemia DR Gala Romney is aware of patient and reported to me that he will consult on patient and see her tomorrow. I consulted Dr. Shanon Brow who will arrange for admission Final Clinical Impressions(s) / ED Diagnoses  Dx #1 symptomatic anemia #2 hyperglycemia Final diagnoses:  None  CRITICAL CARE Performed by: Orlie Dakin Total critical care time: 30 minutes Critical care time was exclusive of separately billable procedures and treating other patients. Critical care was necessary to treat or prevent imminent or life-threatening deterioration. Critical  care was time spent personally by me on the following activities: development of treatment plan with patient and/or surrogate as well as nursing, discussions with consultants, evaluation of patient's response to treatment, examination of patient, obtaining history from patient or surrogate, ordering and performing treatments and interventions, ordering and review of laboratory studies, ordering and review of radiographic studies, pulse oximetry and re-evaluation of patient's condition.  New Prescriptions New Prescriptions   No medications on file     Orlie Dakin, MD 10/24/16 2325

## 2016-10-24 NOTE — ED Notes (Signed)
MD at bedside. 

## 2016-10-24 NOTE — ED Notes (Signed)
Lab in room drawing Blood work

## 2016-10-24 NOTE — ED Notes (Signed)
Pt back from x-ray.

## 2016-10-24 NOTE — ED Notes (Signed)
CRITICAL VALUE ALERT  Critical Value:  Hg 5.3  Date & Time Notied:  10/24/16 2225   Provider Notified: Dr. Lenna Sciara Notified  Orders Received/Actions taken: Notified Provider

## 2016-10-24 NOTE — Progress Notes (Addendum)
Subjective:    Patient ID: Tyler Farmer, male    DOB: [birth year], 76 y.o.   MRN: [id number]  HPI Referred by Dr. Quintin Alto for melena;  Hx of cirrhosis and IDA/ Last EGD was in 2016 with single column of grade 1/11 esophageal varix.   He states his stools are still black. He says his stools are not as black since starting OJ.  He has had black stools since starting the Iron. States he feels pretty good.  He is having a BM x 1 a day. Appetite is okay.No weight loss.     10/12/2016 H and H 7.9 and 24.8.  Ferritin 12  11/13/2015 Colonoscopy: IDA, heme positive stool Impression:               - 3 small nonbleeding AV malformations. 2 at cecum                            and one at transverse colon.                           - Mild portal hypertensive colopathy involing cecum                            and ascending colon.                           - 6 mm polyp in the ascending colon, removed with a                            hot snare. Resected and retrieved.                           - 8 mm polyp at the hepatic flexure, removed with a                             hot snare. Resected and retrieved.                           - External hemorrhoids.  06/02/2013 EGD  Indications:  Patient is 76 year old Caucasian male with multiple medical problems who was also found to have cirrhosis when he had abdominopelvic CT because of right-sided abdominal pain. Biochemical markers for hepatitis B hepatitis C as well as autoimmune markers are negative he was found to have iron deficiency anemia. He is undergoing EGD looking for esophageal varices and also for source of iron deficiency anemia. He has declined to undergo colonoscopy for now. History significant for peptic ulcer disease for which he had surgery in 90s. He was also found to have retroperitoneal infiltrative process in adenopathy and is to follow-up with Dr. Barry Dienes.  Impression: Single column of grade I/II esophageal varix without evidence of gastric varices. Portal gastropathy. Patent Billroth II anastomosis. Edema to jejunal mucosa most likely due to underlying cirrhosis/portal hypertension. Gastric biopsy taken for CLO test and jejunal biopsy for routine histology.   Review of Systems Past Medical History:  Diagnosis Date  . Arthritis    Hands/Fingers  . Back pain   . Diabetes mellitus ([place])   . Enlarged prostate   . Gastric ulcer   . GERD (gastroesophageal reflux disease)   . H/O bladder problems   . Hypercholesteremia   . Hypertension   . Kidney stone   . Prostate cancer ([place])   . Thyroid disease     Past Surgical History:  Procedure Laterality Date  . CIRCUMCISION     at age 8  . COLONOSCOPY N/A 11/13/2015   Procedure: COLONOSCOPY;  Surgeon: Rogene Houston, MD;  [facility];  Service: Endoscopy;  Laterality: N/A;  2:10  . ESOPHAGOGASTRODUODENOSCOPY N/A 06/03/2014   Procedure: ESOPHAGOGASTRODUODENOSCOPY (EGD);  Surgeon: Rogene Houston, MD;  [facility];  Service: Endoscopy;  Laterality: N/A;  730  . Gastric Ulcer  1993  . POLYPECTOMY  11/13/2015   Procedure: POLYPECTOMY;  Surgeon: Rogene Houston, MD;  [facility];  Service: Endoscopy;;  colon  . Right Elbow Right    A pin was put in    Allergies  Allergen Reactions  . Penicillins Itching    Patient states that it also caused pain in his sides.    Current Outpatient Prescriptions on File Prior to Visit  Medication Sig Dispense Refill  . aspirin EC 81 MG tablet Take 1 tablet (81 mg total) by mouth daily.    Marland Kitchen glipiZIDE (GLUCOTROL) 10 MG tablet Take 10 mg by mouth 2 (two) times daily before a meal.    . levothyroxine (SYNTHROID, LEVOTHROID) 137 MCG tablet Take 137 mcg by mouth daily before breakfast.     . lisinopril (PRINIVIL,ZESTRIL) 20 MG tablet Take 20 mg by mouth daily.    . metFORMIN  (GLUCOPHAGE) 850 MG tablet Take 850 mg by mouth 3 (three) times daily with meals.    . simvastatin (ZOCOR) 40 MG tablet Take 40 mg by mouth at bedtime.      No current facility-administered medications on file prior to visit.         Objective:   Physical Exam Blood pressure (!) 108/50, pulse 76, temperature 98.2 F (36.8 C), height 5\' 6"  (1.676 m), weight 220 lb 8 oz (100 kg). Alert and oriented. Skin warm and dry. Skin color is pale.  Oral mucosa is moist.   . Sclera anicteric, conjunctivae is pink. Thyroid not enlarged. No cervical lymphadenopathy. Lungs clear. Heart regular rate and rhythm.  Abdomen is soft. Bowel sounds are positive. No hepatomegaly. No abdominal masses felt. No tenderness.  Stool brown and guaiac negative.         Assessment & Plan:  Melena. IDA. Stool brown and guaiac negative today.  Am going to get a CBC and discuss with Dr. Laural Golden

## 2016-10-24 NOTE — Telephone Encounter (Signed)
Lab called; stat labs from this am revealed H and H of 5.8 and 18; they report unable to reach anyone at Dr. Otelia Limes this afternoon. Chart reviewed. Cirrhotic w melena but hem negative stool .  I called pt - got wife on cell phone- she reports he drove to see his daughter this evening.  I strongly recommended pt be contacted and advised not to drive and be brought to [facility] ED.  Anticipate admission.  Dr. Laural Golden to see in pt in am.  I have advised Dr/ Cathleen Fears  Of pts impending visit

## 2016-10-24 NOTE — ED Triage Notes (Signed)
Pt states he was sent by [facility] for low hemoglobin. Pt states he feels weak.

## 2016-10-24 NOTE — ED Notes (Signed)
Pt given urinal, aware of DO 

## 2016-10-25 ENCOUNTER — Encounter (HOSPITAL_COMMUNITY): Payer: Self-pay | Admitting: Family Medicine

## 2016-10-25 DIAGNOSIS — Q2730 Arteriovenous malformation, site unspecified: Secondary | ICD-10-CM | POA: Diagnosis not present

## 2016-10-25 DIAGNOSIS — K921 Melena: Secondary | ICD-10-CM | POA: Diagnosis not present

## 2016-10-25 DIAGNOSIS — K7030 Alcoholic cirrhosis of liver without ascites: Secondary | ICD-10-CM

## 2016-10-25 DIAGNOSIS — D500 Iron deficiency anemia secondary to blood loss (chronic): Secondary | ICD-10-CM | POA: Diagnosis not present

## 2016-10-25 DIAGNOSIS — D649 Anemia, unspecified: Secondary | ICD-10-CM | POA: Diagnosis not present

## 2016-10-25 DIAGNOSIS — I8500 Esophageal varices without bleeding: Secondary | ICD-10-CM | POA: Diagnosis not present

## 2016-10-25 DIAGNOSIS — K259 Gastric ulcer, unspecified as acute or chronic, without hemorrhage or perforation: Secondary | ICD-10-CM | POA: Diagnosis not present

## 2016-10-25 DIAGNOSIS — J438 Other emphysema: Secondary | ICD-10-CM | POA: Diagnosis not present

## 2016-10-25 DIAGNOSIS — Z980 Intestinal bypass and anastomosis status: Secondary | ICD-10-CM | POA: Diagnosis not present

## 2016-10-25 DIAGNOSIS — K7460 Unspecified cirrhosis of liver: Secondary | ICD-10-CM | POA: Diagnosis not present

## 2016-10-25 DIAGNOSIS — K766 Portal hypertension: Secondary | ICD-10-CM | POA: Diagnosis not present

## 2016-10-25 DIAGNOSIS — K3189 Other diseases of stomach and duodenum: Secondary | ICD-10-CM | POA: Diagnosis not present

## 2016-10-25 LAB — IRON AND TIBC
Iron: 29 ug/dL — ABNORMAL LOW (ref 45–182)
SATURATION RATIOS: 6 % — AB (ref 17.9–39.5)
TIBC: 514 ug/dL — AB (ref 250–450)
UIBC: 485 ug/dL

## 2016-10-25 LAB — URINALYSIS, MICROSCOPIC (REFLEX)
Bacteria, UA: NONE SEEN
RBC / HPF: NONE SEEN RBC/hpf (ref 0–5)
Squamous Epithelial / LPF: NONE SEEN

## 2016-10-25 LAB — GLUCOSE, CAPILLARY
GLUCOSE-CAPILLARY: 186 mg/dL — AB (ref 65–99)
GLUCOSE-CAPILLARY: 194 mg/dL — AB (ref 65–99)
GLUCOSE-CAPILLARY: 203 mg/dL — AB (ref 65–99)
GLUCOSE-CAPILLARY: 222 mg/dL — AB (ref 65–99)
GLUCOSE-CAPILLARY: 260 mg/dL — AB (ref 65–99)
Glucose-Capillary: 233 mg/dL — ABNORMAL HIGH (ref 65–99)

## 2016-10-25 LAB — CBC
HCT: 24.2 % — ABNORMAL LOW (ref 39.0–52.0)
Hemoglobin: 7.5 g/dL — ABNORMAL LOW (ref 13.0–17.0)
MCH: 26.3 pg (ref 26.0–34.0)
MCHC: 31 g/dL (ref 30.0–36.0)
MCV: 84.9 fL (ref 78.0–100.0)
Platelets: 116 10*3/uL — ABNORMAL LOW (ref 150–400)
RBC: 2.85 MIL/uL — AB (ref 4.22–5.81)
RDW: 17 % — AB (ref 11.5–15.5)
WBC: 4.3 10*3/uL (ref 4.0–10.5)

## 2016-10-25 LAB — BASIC METABOLIC PANEL
Anion gap: 7 (ref 5–15)
BUN: 15 mg/dL (ref 6–20)
CALCIUM: 8.7 mg/dL — AB (ref 8.9–10.3)
CHLORIDE: 106 mmol/L (ref 101–111)
CO2: 25 mmol/L (ref 22–32)
Creatinine, Ser: 0.86 mg/dL (ref 0.61–1.24)
GFR calc non Af Amer: >60 mL/min (ref >60)
Glucose, Bld: 180 mg/dL — ABNORMAL HIGH (ref 65–99)
Potassium: 4.3 mmol/L (ref 3.5–5.1)
SODIUM: 138 mmol/L (ref 135–145)

## 2016-10-25 LAB — URINALYSIS, ROUTINE W REFLEX MICROSCOPIC
BILIRUBIN URINE: NEGATIVE
Glucose, UA: NEGATIVE mg/dL
HGB URINE DIPSTICK: NEGATIVE
Ketones, ur: NEGATIVE mg/dL
Nitrite: NEGATIVE
Protein, ur: NEGATIVE mg/dL
SPECIFIC GRAVITY, URINE: 1.025 (ref 1.005–1.030)
pH: 5.5 (ref 5.0–8.0)

## 2016-10-25 LAB — RETICULOCYTES
RBC.: 2.06 MIL/uL — ABNORMAL LOW (ref 4.22–5.81)
RETIC CT PCT: 3.6 % — AB (ref 0.4–3.1)
Retic Count, Absolute: 74.2 10*3/uL (ref 19.0–186.0)

## 2016-10-25 LAB — VITAMIN B12: Vitamin B-12: 163 pg/mL — ABNORMAL LOW (ref 180–914)

## 2016-10-25 LAB — PREPARE RBC (CROSSMATCH)

## 2016-10-25 LAB — FERRITIN: FERRITIN: 7 ng/mL — AB (ref 24–336)

## 2016-10-25 LAB — FOLATE: Folate: 26.2 ng/mL (ref >5.9)

## 2016-10-25 MED ORDER — LEVOTHYROXINE SODIUM 25 MCG PO TABS
137.0000 ug | ORAL_TABLET | Freq: Every day | ORAL | Status: DC
  Administered 2016-10-25 – 2016-10-26 (×2): 137 ug via ORAL
  Filled 2016-10-25 (×2): qty 1

## 2016-10-25 MED ORDER — ONDANSETRON HCL 4 MG/2ML IJ SOLN: 4.0000 mg | Freq: Four times a day (QID) | INTRAMUSCULAR | Status: DC | PRN

## 2016-10-25 MED ORDER — SODIUM CHLORIDE 0.9% FLUSH: 3.0000 mL | INTRAVENOUS | Status: DC | PRN

## 2016-10-25 MED ORDER — INSULIN ASPART 100 UNIT/ML ~~LOC~~ SOLN
0.0000 [IU] | Freq: Three times a day (TID) | SUBCUTANEOUS | Status: DC
Start: 2016-10-25 — End: 2016-10-27
  Administered 2016-10-25 (×2): 3 [IU] via SUBCUTANEOUS
  Administered 2016-10-25 – 2016-10-26 (×2): 2 [IU] via SUBCUTANEOUS
  Administered 2016-10-26: 3 [IU] via SUBCUTANEOUS

## 2016-10-25 MED ORDER — ORAL CARE MOUTH RINSE
15.0000 mL | Freq: Two times a day (BID) | OROMUCOSAL | Status: DC
  Administered 2016-10-25 – 2016-10-26 (×3): 15 mL via OROMUCOSAL

## 2016-10-25 MED ORDER — LISINOPRIL 10 MG PO TABS
20.0000 mg | ORAL_TABLET | Freq: Every day | ORAL | Status: DC
  Administered 2016-10-25 – 2016-10-26 (×2): 20 mg via ORAL
  Filled 2016-10-25 (×2): qty 2

## 2016-10-25 MED ORDER — SODIUM CHLORIDE 0.9 % IV SOLN: Freq: Once | INTRAVENOUS | Status: AC

## 2016-10-25 MED ORDER — FUROSEMIDE 10 MG/ML IJ SOLN
40.0000 mg | Freq: Once | INTRAMUSCULAR | Status: AC
  Administered 2016-10-25: 40 mg via INTRAVENOUS
  Filled 2016-10-25: qty 4

## 2016-10-25 MED ORDER — ONDANSETRON HCL 4 MG PO TABS: 4.0000 mg | ORAL_TABLET | Freq: Four times a day (QID) | ORAL | Status: DC | PRN

## 2016-10-25 MED ORDER — FERROUS SULFATE 325 (65 FE) MG PO TABS: 325.0000 mg | ORAL_TABLET | Freq: Two times a day (BID) | ORAL | Status: DC

## 2016-10-25 MED ORDER — SODIUM CHLORIDE 0.9% FLUSH
3.0000 mL | Freq: Two times a day (BID) | INTRAVENOUS | Status: DC
  Administered 2016-10-25 – 2016-10-26 (×3): 3 mL via INTRAVENOUS

## 2016-10-25 MED ORDER — SODIUM CHLORIDE 0.9 % IV SOLN
250.0000 mL | INTRAVENOUS | Status: DC | PRN
  Administered 2016-10-25: 250 mL via INTRAVENOUS

## 2016-10-25 MED ORDER — DIPHENHYDRAMINE HCL 25 MG PO CAPS
25.0000 mg | ORAL_CAPSULE | Freq: Once | ORAL | Status: AC
  Administered 2016-10-25: 25 mg via ORAL
  Filled 2016-10-25: qty 1

## 2016-10-25 MED ORDER — SIMVASTATIN 20 MG PO TABS
40.0000 mg | ORAL_TABLET | Freq: Every day | ORAL | Status: DC
  Administered 2016-10-25: 40 mg via ORAL
  Filled 2016-10-25 (×2): qty 2

## 2016-10-25 MED ORDER — SODIUM CHLORIDE 0.9 % IV SOLN
INTRAVENOUS | Status: DC
  Administered 2016-10-25: via INTRAVENOUS

## 2016-10-25 MED ORDER — INSULIN ASPART 100 UNIT/ML ~~LOC~~ SOLN: 0.0000 [IU] | Freq: Every day | SUBCUTANEOUS | Status: DC

## 2016-10-25 MED ORDER — ACETAMINOPHEN 325 MG PO TABS
650.0000 mg | ORAL_TABLET | Freq: Once | ORAL | Status: AC
  Administered 2016-10-25: 650 mg via ORAL
  Filled 2016-10-25: qty 2

## 2016-10-25 NOTE — Progress Notes (Signed)
I have seen and assessed patient and agree with Dr. Camelia Eng assessment and plan. Patient 75 year old gentleman history of AVMs, chronic GI bleed, gastric ulcer, diabetes, hypertension is unable from [facility] office for symptomatic anemia with occasional melanotic stools. Hemoglobin on admission was 5.8. Patient also did have some complaints of some melanotic stools a few days ago however none since. Patient transfused 2 units packed red blood cells hemoglobin at 7.5. Transfuse 2 more units packed red blood cells. Patient for upper endoscopy on 10/26/2016 for further evaluation as well as abdominal ultrasound as recommended by GI. Posttransfusion may consider giving patient a dose of IV iron.

## 2016-10-25 NOTE — Telephone Encounter (Signed)
Patient is seen in the hospital earlier today

## 2016-10-25 NOTE — Progress Notes (Signed)
Brief history of present illness: Patient is 76 year old Caucasian male who was history of iron deficiency anemia as well as cirrhosis who was seen in the office yesterday with history of melena. He did not appear to be acutely ill. Lab studies are drawn. Hemoglobin was 5.8 g. Lab contacted Dr. Gala Romney last evening and patient was advised to the prior to the hospital for admission. Patient has received 2 units of PRBCs. His hemoglobin is still low at 7.5 g. He is to get another unit of blood. Patient gives history of melena. However stool was guaiac-negative in the office yesterday. He is on low-dose aspirin. He denies abdominal pain nausea or vomiting. He feels much better since he received 2 units of PRBCs.  Prior GI workup includes EGD in March 2016 revealing grade 1-2 esophageal varices portal gastropathy in B1 anastomosis. Colonoscopy in August 2017 revealed portal colopathy 3 small nonbleeding AV malformations ulcerated hamartoma and a small adenoma.   Objective: Blood pressure 127/62, pulse 90, temperature 98.4 F (36.9 C), temperature source Oral, resp. rate 16, height 5' 8"  (1.727 m), weight 222 lb 12.8 oz (101.1 kg), SpO2 98 %. Patient is alert and in no acute distress. He does not have asterixis. Conjunctiva is pale. Sclera is nonicteric Oropharyngeal mucosa is normal. No neck masses or thyromegaly noted. Cardiac exam with regular rhythm normal S1 and S2. No murmur or gallop noted. Lungs are clear to auscultation. Abdomen is full with upper midline scar. Abdomen is soft with easily palpable firm and mildly tender liver. Spleen is not palpable. No LE edema or clubbing noted.  Labs/studies Results:   Recent Labs  10/24/16 1041 10/24/16 2149 10/25/16 0641  WBC 3.6* 3.6* 4.3  HGB 5.8* 5.3* 7.5*  HCT 18.7* 17.9* 24.2*  PLT 136* 113* 116*    BMET   Recent Labs  10/24/16 2149 10/25/16 0641  NA 139 138  K 4.2 4.3  CL 107 106  CO2 23 25  GLUCOSE 228* 180*  BUN 18 15   CREATININE 0.97 0.86  CALCIUM 8.8* 8.7*    LFT   Recent Labs  10/24/16 2149  PROT 6.6  ALBUMIN 3.4*  AST 26  ALT 17  ALKPHOS 72  BILITOT 0.8    PT/INR   Recent Labs  10/24/16 2149  LABPROT 15.1  INR 1.19      Assessment:  #1.Acute on chronic anemia most likely secondary to GI blood loss which would appear to be intermittent since his stool was guaiac negative in the office yesterday. EGD in March 2016 revealed grade 1-2 esophageal varices as well as portal gastropathy. Colonoscopy in August 2017 revealed 3 small nonbleeding AV malformations and proximal colon, portal colopathy and polyps were removed(adenoma and hamartoma). He could be losing blood from any part of his GI tract. Given history of cirrhosis with portal gastropathy and varices he could be losing blood from upper GI tract. Therefore upper GI tract needs to be examined.  #2. Tender hepatomegaly. Last ultrasound was 1 year ago. Transaminases are normal.  Recommendations:  Diagnostic esophagogastroduodenoscopy in a.m. Upper abdominal ultrasound in a.m.

## 2016-10-25 NOTE — Telephone Encounter (Signed)
Patient admitted yesterday. Dr. Laural Golden aware. EGD scheduled for tomorrow

## 2016-10-25 NOTE — H&P (Signed)
History and Physical    Tyler Farmer EQA:[id number] DOB: 05-18-1940 DOA: 10/24/2016  PCP: [name], MD  Patient coming from: home  Chief Complaint:   Weak, dizzy  HPI: Tyler Farmer is a 76 y.o. male with medical history significant of AVM, chronic gi bleed, gastric ulcer, DM, HTN sent in from GI office today after going there for f/u and reported being weak and dizzy with occasional melena.  Labs were done which revealed a hgb below 6.  Pt was called by GI office and advised to come to ED for his symptomatic anemia.  Pt reports melena last week for a day or so.  No brbpr.  No abdominal pain and no vomiting.  He has been very weak and feeling like he is going to pass out when he gets up and moves around.  No fevers.  Pt referred for admission for his anemia and gib.   Review of Systems: As per HPI otherwise 10 point review of systems negative.   Past Medical History:  Diagnosis Date  . Arthritis    Hands/Fingers  . Back pain   . Diabetes mellitus ([place])   . Enlarged prostate   . Gastric ulcer   . GERD (gastroesophageal reflux disease)   . H/O bladder problems   . Hypercholesteremia   . Hypertension   . Kidney stone   . Prostate cancer ([place])   . Thyroid disease     Past Surgical History:  Procedure Laterality Date  . CIRCUMCISION     at age 62  . COLONOSCOPY N/A 11/13/2015   Procedure: COLONOSCOPY;  Surgeon: Rogene Houston, MD;  [facility];  Service: Endoscopy;  Laterality: N/A;  2:10  . ESOPHAGOGASTRODUODENOSCOPY N/A 06/03/2014   Procedure: ESOPHAGOGASTRODUODENOSCOPY (EGD);  Surgeon: Rogene Houston, MD;  Location: [facility];  Service: Endoscopy;  Laterality: N/A;  730  . Gastric Ulcer  1993  . POLYPECTOMY  11/13/2015   Procedure: POLYPECTOMY;  Surgeon: Rogene Houston, MD;  Location: [facility];  Service: Endoscopy;;  colon  . Right Elbow Right    A pin was put in     reports that he quit smoking about 27 years ago. He has never used  smokeless tobacco. He reports that he does not drink alcohol or use drugs.  Allergies  Allergen Reactions  . Penicillins Nausea And Vomiting    Has patient had a PCN reaction causing immediate rash, facial/tongue/throat swelling, SOB or lightheadedness with hypotension: Yes Has patient had a PCN reaction causing severe rash involving mucus membranes or skin necrosis: No Has patient had a PCN reaction that required hospitalization: No Has patient had a PCN reaction occurring within the last 10 years: No If all of the above answers are "NO", then may proceed with Cephalosporin use.   Patient states that it also caused pain in his sides.    Family History  Problem Relation Age of Onset  . Emphysema Mother   . Cerebrovascular Accident Father   . Heart disease Father   . Diabetes Sister   . Heart disease Brother   . Heart disease Sister   . Heart disease Brother   . Diabetes Brother     Prior to Admission medications   Medication Sig Start Date End Date Taking? Authorizing Provider  aspirin EC 81 MG tablet Take 1 tablet (81 mg total) by mouth daily. 11/24/15  Yes Rehman, Mechele Dawley, MD  ferrous sulfate 325 (65 FE) MG EC tablet Take  65 mg by mouth 2 (two) times daily.   Yes [provider]  glipiZIDE (GLUCOTROL) 10 MG tablet Take 10 mg by mouth 2 (two) times daily before a meal.   Yes [provider]  levothyroxine (SYNTHROID, LEVOTHROID) 137 MCG tablet Take 137 mcg by mouth daily before breakfast.  04/04/15  Yes [provider]  lisinopril (PRINIVIL,ZESTRIL) 20 MG tablet Take 20 mg by mouth daily.   Yes [provider]  metFORMIN (GLUCOPHAGE) 850 MG tablet Take 850 mg by mouth 3 (three) times daily with meals.   Yes [provider]  simvastatin (ZOCOR) 40 MG tablet Take 40 mg by mouth at bedtime.    Yes [provider]    Physical Exam: Vitals:   10/24/16 2057 10/24/16 2242 10/24/16 2342 10/25/16 0001  BP: (!) 153/68 (!) 126/58  128/67 (!) 123/57  Pulse: (!) 104 98 (!) 101 90  Resp: 20 (!) 25    Temp: 98.2 F (36.8 C) 98.8 F (37.1 C) 98.6 F (37 C) 98.8 F (37.1 C)  TempSrc:  Oral Oral Oral  SpO2: 98% 97% 97% 97%  Weight: 99.8 kg (220 lb)     Height: 5' 8"  (1.727 m)         Constitutional: NAD, calm, comfortable, pale Vitals:   10/24/16 2057 10/24/16 2242 10/24/16 2342 10/25/16 0001  BP: (!) 153/68 (!) 126/58 128/67 (!) 123/57  Pulse: (!) 104 98 (!) 101 90  Resp: 20 (!) 25    Temp: 98.2 F (36.8 C) 98.8 F (37.1 C) 98.6 F (37 C) 98.8 F (37.1 C)  TempSrc:  Oral Oral Oral  SpO2: 98% 97% 97% 97%  Weight: 99.8 kg (220 lb)     Height: 5' 8"  (1.727 m)      Eyes: PERRL, lids and conjunctivae normal ENMT: Mucous membranes are moist. Posterior pharynx clear of any exudate or lesions.Normal dentition.  Neck: normal, supple, no masses, no thyromegaly Respiratory: clear to auscultation bilaterally, no wheezing, no crackles. Normal respiratory effort. No accessory muscle use.  Cardiovascular: Regular rate and rhythm, no murmurs / rubs / gallops. No extremity edema. 2+ pedal pulses. No carotid bruits.  Abdomen: no tenderness, no masses palpated. No hepatosplenomegaly. Bowel sounds positive.  Musculoskeletal: no clubbing / cyanosis. No joint deformity upper and lower extremities. Good ROM, no contractures. Normal muscle tone.  Skin: no rashes, lesions, ulcers. No induration Neurologic: CN 2-12 grossly intact. Sensation intact, DTR normal. Strength 5/5 in all 4.  Psychiatric: Normal judgment and insight. Alert and oriented x 3. Normal mood.    Labs on Admission: I have personally reviewed following labs and imaging studies  CBC:  Recent Labs Lab 10/24/16 1041 10/24/16 2149  WBC 3.6* 3.6*  NEUTROABS 2,304 2.1  HGB 5.8* 5.3*  HCT 18.7* 17.9*  MCV 83.9 89.1  PLT 136* 161*   Basic Metabolic Panel:  Recent Labs Lab 10/24/16 2149  NA 139  K 4.2  CL 107  CO2 23  GLUCOSE 228*  BUN 18    CREATININE 0.97  CALCIUM 8.8*   GFR: Estimated Creatinine Clearance: 75.4 mL/min (by C-G formula based on SCr of 0.97 mg/dL). Liver Function Tests:  Recent Labs Lab 10/24/16 2149  AST 26  ALT 17  ALKPHOS 72  BILITOT 0.8  PROT 6.6  ALBUMIN 3.4*   Coagulation Profile:  Recent Labs Lab 10/24/16 2149  INR 1.19   Cardiac Enzymes:  Recent Labs Lab 10/24/16 2149  TROPONINI <0.03   Radiological Exams on Admission: Dg  Chest 2 View  Result Date: 10/24/2016 CLINICAL DATA:  Weakness and anemia. EXAM: CHEST  2 VIEW COMPARISON:  Prior chest x-ray on 02/06/2011 at [facility] FINDINGS: The heart size and mediastinal contours are within normal limits. Mild pulmonary venous hypertensive changes without overt airspace edema or pleural fluid. No nodules or pneumothorax identified. The visualized skeletal structures are unremarkable. IMPRESSION: Pulmonary venous hypertensive changes without overt edema. Electronically Signed   By: Aletta Edouard M.D.   On: 10/24/2016 21:56    Assessment/Plan 76 yo male with symptomatic anemia for gi blood losses  Principal Problem:   IDA (iron deficiency anemia) with acute gib anemia- melenotic stools in the last week.  Gi aware.  Keep npo.  Hold aspirin.  Gi consulted.  Transfuse 2 units prbc.  Repeat cbc in the am.  Vitals stable.  Pt was heme neg in GI office today, this was repeated in ED and again heme neg.  Active Problems:   Hyperlipidemia- holding m eds   HTN (hypertension)- holding meds   DM (diabetes mellitus) ([place])- ssi   COPD (chronic obstructive pulmonary disease) ([place])- stable   Cirrhosis of liver without ascites ([place])- stable at this time   Melena- as above   Gastric ulcer- noted   AVM (arteriovenous malformation)- likely source of bleed   DVT prophylaxis: scds  Code Status:  full Family Communication: none  Disposition Plan:  Per day team Consults called:  GI Admission status: observation    DAVID,RACHAL A MD [facility]  If 7PM-7AM, please contact night-coverage [email or web address] Password [email or web address]  10/25/2016, 12:19 AM

## 2016-10-25 NOTE — Telephone Encounter (Signed)
See attached note.

## 2016-10-25 NOTE — Care Management Obs Status (Signed)
[facility] NOTIFICATION   Patient Details  Name: Tyler Farmer MRN: [id number] Date of Birth: 02/15/1941   Medicare Observation Status Notification Given:  Yes    Shalin Linders, Chauncey Reading, RN 10/25/2016, 11:52 AM

## 2016-10-25 NOTE — Care Management Note (Signed)
Case Management Note  Patient Details  Name: Tyler Farmer MRN: [id number] Date of Birth: 1941-03-19  Subjective/Objective:    Adm with anemia. From home, ind with ADL's. No home health or adaptive devices PTA. Lives with wife, has PCP and still drives to appointments. Reports he has declined home health in the past and still declines need for home health today. Patient wears oxygen at night time provided by [employer], he does have equipment to wear continuously if needed. Currently on room air during assessment.                 Action/Plan: Anticipate DC home with self care. No CM needs known or communicated.   Expected Discharge Date:   10/26/2016               Expected Discharge Plan:  Home/Self Care  In-House Referral:     Discharge planning Services  CM Consult  Post Acute Care Choice:  NA Choice offered to:  NA  DME Arranged:    DME Agency:     HH Arranged:    HH Agency:     Status of Service:  Completed, signed off  If discussed at [employer] of Stay Meetings, dates discussed:    Additional Comments:  Jaloni Sorber, Chauncey Reading, RN 10/25/2016, 11:48 AM

## 2016-10-26 ENCOUNTER — Encounter (HOSPITAL_COMMUNITY)
Admission: EM | Disposition: A | Discharge status: Still In Hospital | Payer: Self-pay | Source: Home / Self Care | Attending: Emergency Medicine

## 2016-10-26 ENCOUNTER — Encounter (HOSPITAL_COMMUNITY): Payer: Self-pay | Admitting: *Deleted

## 2016-10-26 ENCOUNTER — Observation Stay (HOSPITAL_COMMUNITY): Payer: Medicare Other

## 2016-10-26 DIAGNOSIS — K922 Gastrointestinal hemorrhage, unspecified: Secondary | ICD-10-CM

## 2016-10-26 DIAGNOSIS — Z980 Intestinal bypass and anastomosis status: Secondary | ICD-10-CM

## 2016-10-26 DIAGNOSIS — K921 Melena: Secondary | ICD-10-CM

## 2016-10-26 DIAGNOSIS — I10 Essential (primary) hypertension: Secondary | ICD-10-CM

## 2016-10-26 DIAGNOSIS — E538 Deficiency of other specified B group vitamins: Secondary | ICD-10-CM | POA: Diagnosis present

## 2016-10-26 DIAGNOSIS — I8500 Esophageal varices without bleeding: Secondary | ICD-10-CM | POA: Diagnosis not present

## 2016-10-26 DIAGNOSIS — K766 Portal hypertension: Secondary | ICD-10-CM

## 2016-10-26 DIAGNOSIS — D500 Iron deficiency anemia secondary to blood loss (chronic): Secondary | ICD-10-CM

## 2016-10-26 DIAGNOSIS — K3189 Other diseases of stomach and duodenum: Secondary | ICD-10-CM

## 2016-10-26 DIAGNOSIS — Q2730 Arteriovenous malformation, site unspecified: Secondary | ICD-10-CM

## 2016-10-26 DIAGNOSIS — K7460 Unspecified cirrhosis of liver: Secondary | ICD-10-CM | POA: Diagnosis not present

## 2016-10-26 HISTORY — DX: Gastrointestinal hemorrhage, unspecified: K92.2

## 2016-10-26 HISTORY — PX: ESOPHAGOGASTRODUODENOSCOPY: SHX5428

## 2016-10-26 HISTORY — DX: Deficiency of other specified B group vitamins: E53.8

## 2016-10-26 LAB — BPAM RBC
BLOOD PRODUCT EXPIRATION DATE: 201808212359
BLOOD PRODUCT EXPIRATION DATE: 201808292359
Blood Product Expiration Date: 201808292359
Blood Product Expiration Date: 201809042359
ISSUE DATE / TIME: 201808062340
ISSUE DATE / TIME: 201808070206
ISSUE DATE / TIME: 201808071603
ISSUE DATE / TIME: 201808072033
UNIT TYPE AND RH: 5100
Unit Type and Rh: 5100
Unit Type and Rh: 5100
Unit Type and Rh: 9500

## 2016-10-26 LAB — TYPE AND SCREEN
ABO/RH(D): O POS
ANTIBODY SCREEN: NEGATIVE
UNIT DIVISION: 0
UNIT DIVISION: 0
UNIT DIVISION: 0
Unit division: 0

## 2016-10-26 LAB — GLUCOSE, CAPILLARY
GLUCOSE-CAPILLARY: 153 mg/dL — AB (ref 65–99)
GLUCOSE-CAPILLARY: 137 mg/dL — AB (ref 65–99)
Glucose-Capillary: 217 mg/dL — ABNORMAL HIGH (ref 65–99)
Glucose-Capillary: 176 mg/dL — ABNORMAL HIGH (ref 65–99)

## 2016-10-26 LAB — HEMOGLOBIN AND HEMATOCRIT, BLOOD
HCT: 28.8 % — ABNORMAL LOW (ref 39.0–52.0)
Hemoglobin: 9.3 g/dL — ABNORMAL LOW (ref 13.0–17.0)

## 2016-10-26 SURGERY — EGD (ESOPHAGOGASTRODUODENOSCOPY)
Anesthesia: Moderate Sedation

## 2016-10-26 MED ORDER — STERILE WATER FOR IRRIGATION IR SOLN
Status: DC | PRN
  Administered 2016-10-26: 14:00:00

## 2016-10-26 MED ORDER — PANTOPRAZOLE SODIUM 40 MG PO TBEC: 40.0000 mg | DELAYED_RELEASE_TABLET | Freq: Every day | ORAL | 3 refills | Status: DC

## 2016-10-26 MED ORDER — LIDOCAINE VISCOUS 2 % MT SOLN
OROMUCOSAL | Status: AC
  Filled 2016-10-26: qty 15

## 2016-10-26 MED ORDER — GLIPIZIDE 10 MG PO TABS: 10.0000 mg | ORAL_TABLET | Freq: Two times a day (BID) | ORAL | Status: DC

## 2016-10-26 MED ORDER — MIDAZOLAM HCL 5 MG/5ML IJ SOLN
INTRAMUSCULAR | Status: DC | PRN
  Administered 2016-10-26 (×2): 2 mg via INTRAVENOUS

## 2016-10-26 MED ORDER — CYANOCOBALAMIN 1000 MCG/ML IJ SOLN
1000.0000 ug | Freq: Once | INTRAMUSCULAR | Status: AC
  Administered 2016-10-26: 1000 ug via INTRAMUSCULAR
  Filled 2016-10-26: qty 1

## 2016-10-26 MED ORDER — LIDOCAINE HCL 2 % EX GEL
CUTANEOUS | Status: DC | PRN
  Administered 2016-10-26: 1

## 2016-10-26 MED ORDER — PANTOPRAZOLE SODIUM 40 MG PO TBEC
40.0000 mg | DELAYED_RELEASE_TABLET | Freq: Every day | ORAL | Status: DC
  Administered 2016-10-26: 40 mg via ORAL

## 2016-10-26 MED ORDER — FERROUS SULFATE 325 (65 FE) MG PO TBEC: 65.0000 mg | DELAYED_RELEASE_TABLET | Freq: Two times a day (BID) | ORAL | 3 refills | Status: DC

## 2016-10-26 MED ORDER — SODIUM CHLORIDE 0.9% FLUSH
INTRAVENOUS | Status: AC
  Filled 2016-10-26: qty 10

## 2016-10-26 MED ORDER — MEPERIDINE HCL 50 MG/ML IJ SOLN
INTRAMUSCULAR | Status: DC | PRN
  Administered 2016-10-26: 25 mg

## 2016-10-26 MED ORDER — MEPERIDINE HCL 50 MG/ML IJ SOLN
INTRAMUSCULAR | Status: AC
  Filled 2016-10-26: qty 1

## 2016-10-26 MED ORDER — MIDAZOLAM HCL 5 MG/5ML IJ SOLN
INTRAMUSCULAR | Status: AC
  Filled 2016-10-26: qty 10

## 2016-10-26 NOTE — Discharge Summary (Signed)
Physician Discharge Summary  Tyler Farmer IDP:[id number] DOB: 12-Mar-1941 DOA: 10/24/2016  PCP: [name], MD  Admit date: 10/24/2016 Discharge date: 10/26/2016  Time spent: Greater than 30 minutes  Recommendations for Outpatient Follow-up:  1. Small bowel capsule study is pending for 10/28/16 per GI.  2. Recommend monthly injections of vitamin B12 to treat deficiency.    Discharge Diagnoses:  1. Upper GI bleed with undetermined source of bleeding; clinically undetermined. 2. Per EGD, normal proximal esophagus, esophageal varices, portal hypertensive gastropathy with single small AVM malformation at the gastric body without stigmata of bleeding, Billroth I gastroduodenostomy. -Per Dr. Laural Golden, there was no source of bleeding identified. 3. Acute blood loss anemia with an admitting hemoglobin of 5.8. -Status post a total of 4 units of packed red blood cell transfusions -Hemoglobin was 9.3 at the time of discharge. 3. Thrombocytopenia, likely secondary to GI bleed and from chronic cirrhosis. 4. Vitamin B12 deficiency, new diagnosis. 5. Type 2 diabetes mellitus, non-insulin-dependent. 6. Essential hypertension. 7. Cirrhosis without ascites. 8. COPD, stable.  Discharge Condition: Improved.  Diet recommendation: Carbohydrate modified/heart healthy.  Filed Weights   10/25/16 0500 10/26/16 0625 10/26/16 1302  Weight: 101.1 kg (222 lb 12.8 oz) 97 kg (213 lb 14.4 oz) 96.6 kg (213 lb)    History of present illness:  Patient is a 76 year old man with a history of hepatic cirrhosis, peptic ulcer disease, gastric AVM, esophageal varices, non-insulin-dependent diabetes mellitus, and hypertension, who presented as a direct permission from his gastroenterologist's office, Dr. Laural Golden, after a complaint of feeling weak and dizzy and with occasional melena. The patient was found to have a hemoglobin of 5.8 on admission. He was admitted for further evaluation.Marland Kitchen  Hospital Course:  Patient was  transfused 2 units of packed red blood cells following admission. His hemoglobin improved to 7.5. Another 2 units were even for total of 4 units. His hemoglobin improved to greater than 9 appropriately. Hemoglobin/hematocrit were monitored closely. IV Protonix was also started. Aspirin was withheld. Gastroenterologist, Dr. Laural Golden was consulted. He noted that the patient's prior GI workup included an EGD 05/2014 which revealed grade 1-2 esophageal varices, portal gastropathy in a B1 anastomosis. Colonoscopy in 10/2015 revealed portal colopathy, 3 small nonbleeding AVM now formations, ulcerated hamartoma and a small adenoma.  Dr. Laural Golden proceeded with an upper endoscopy on 10/26/16. The results were dictated above. In essence, there was no evidence of active bleeding. Therefore, the source of the upper GI bleeding causing the symptomatic anemia was clinically undetermined. Dr. Laural Golden plans an outpatient capsule study for 10/28/2016 to help determine the source of the GI bleeding.  An anemia panel was ordered and revealed a low total iron of 29, low ferritin of 7, and low vitamin B12 of 163. One IM dose of vitamin B12 was given prior to the patient's discharge. He will need further treatment with IM injections per the discretion of his PCP or Dr. Laural Golden. He will restart oral iron following the capsule study in the outpatient setting.  The patient remained afebrile and hemodynamically stable during the hospital course. His chronic medical conditions remained stable and controlled. His oral hypoglycemic medications were withheld and his diabetes was treated with sliding scale NovoLog. Prior to discharge, he was instructed to not take glipizide for a blood glucose of less than 130 at home to avoid symptomatic hypoglycemia. Otherwise, he was instructed to continue his regimen of glipizide and metformin with the instructions noted. Patient was also instructed to not take his aspirin until restarting  it was approved by Dr.  Laural Golden. He was also discharged on once daily dosing of Protonix.  Procedures:  EGD on 10/26/16 by Dr. Marin Roberts dictated above.  Consultations:  Gastroenterology  Discharge Exam: Vitals:   10/26/16 1430 10/26/16 1500  BP: 128/74 (!) 113/53  Pulse: 81 85  Resp: 20 18  Temp:  97.8 F (36.6 C)    General: Pleasant 76 year old man in no acute distress. Cardiovascular: S1, S2, with a soft systolic murmur. Respiratory: Clear anteriorly with decreased breath sounds in bases. Breathing nonlabored. Abdomen: Positive bowel sounds, soft, nontender, nondistended.  Discharge Instructions   Discharge Instructions    Diet - low sodium heart healthy    Complete by:  As directed    Diet Carb Modified    Complete by:  As directed    Discharge instructions    Complete by:  As directed    DO NOT TAKE ASPIRIN UNTIL DR. Laural Golden INSTRUCTS YOU TO. DO NOT TAKE IRON SUPPLEMENT UNTIL AFTER YOUR PROCEDURE. AVOID ALL OTHER NSAIDS.   Increase activity slowly    Complete by:  As directed      Current Discharge Medication List    START taking these medications   Details  pantoprazole (PROTONIX) 40 MG tablet Take 1 tablet (40 mg total) by mouth daily. Qty: 30 tablet, Refills: 3      CONTINUE these medications which have CHANGED   Details  ferrous sulfate 325 (65 FE) MG EC tablet Take 1 tablet (325 mg total) by mouth 2 (two) times daily. Refills: 3    glipiZIDE (GLUCOTROL) 10 MG tablet Take 1 tablet (10 mg total) by mouth 2 (two) times daily before a meal. DO NOT TAKE IF YOUR BLOOD SUGAR IS BELOW 130.      CONTINUE these medications which have NOT CHANGED   Details  levothyroxine (SYNTHROID, LEVOTHROID) 137 MCG tablet Take 137 mcg by mouth daily before breakfast.     lisinopril (PRINIVIL,ZESTRIL) 20 MG tablet Take 20 mg by mouth daily.    metFORMIN (GLUCOPHAGE) 850 MG tablet Take 850 mg by mouth 3 (three) times daily with meals.    simvastatin (ZOCOR) 40 MG tablet Take 40 mg by  mouth at bedtime.       STOP taking these medications     aspirin EC 81 MG tablet        Allergies  Allergen Reactions  . Penicillins Nausea And Vomiting    Has patient had a PCN reaction causing immediate rash, facial/tongue/throat swelling, SOB or lightheadedness with hypotension: Yes Has patient had a PCN reaction causing severe rash involving mucus membranes or skin necrosis: No Has patient had a PCN reaction that required hospitalization: No Has patient had a PCN reaction occurring within the last 10 years: No If all of the above answers are "NO", then may proceed with Cephalosporin use.   Patient states that it also caused pain in his sides.   Follow-up Information    Rehman, Mechele Dawley, MD Follow up.   Specialty:  Gastroenterology Contact information: [address], [address] [place] [address] [phone]        [name], MD Follow up in 2 week(s).   Specialty:  Family Medicine Contact information: [place] [address] [phone]            The results of significant diagnostics from this hospitalization (including imaging, microbiology, ancillary and laboratory) are listed below for reference.    Significant Diagnostic Studies: Dg Chest 2  View  Result Date: 10/24/2016 CLINICAL DATA:  Weakness and anemia. EXAM: CHEST  2 VIEW COMPARISON:  Prior chest x-ray on 02/06/2011 at [facility]. FINDINGS: The heart size and mediastinal contours are within normal limits. Mild pulmonary venous hypertensive changes without overt airspace edema or pleural fluid. No nodules or pneumothorax identified. The visualized skeletal structures are unremarkable. IMPRESSION: Pulmonary venous hypertensive changes without overt edema. Electronically Signed   By: Aletta Edouard M.D.   On: 10/24/2016 21:56   US Abdomen Complete  Result Date: 10/26/2016 CLINICAL DATA:  Cirrhosis. EXAM: ABDOMEN ULTRASOUND COMPLETE COMPARISON:  10/30/2015 FINDINGS: Gallbladder:  Probable gallbladder sludge. No wall thickening or pericholecystic fluid. Sonographic Murphy's sign was not elicited. Common bile duct: Diameter: Normal, 4 mm. Liver: Heterogeneous hepatic echotexture, consistent with cirrhosis. Caudate lobe enlargement. No focal liver lesion. IVC: No abnormality visualized. Pancreas: Visualized portion unremarkable. Spleen: Splenomegaly, 16 cm craniocaudal. Splenic volume of 1146 cc. Right Kidney: Length: 12.3 cm. A lower pole right renal cyst or minimally complex cyst measures on the order of 2.6 cm. No hydronephrosis. Left Kidney: Length: 12.7 cm. No hydronephrosis. Lower pole 6.6 cm simple cyst. Abdominal aorta: Partially obscured by bowel gas. No aneurysm identified. Other findings: No ascites. Hepatopetal portal vein, which is dilated. IMPRESSION: 1. Cirrhosis with splenomegaly and probable portal venous hypertension. 2. No evidence of hepatocellular carcinoma. 3. Possible gallbladder sludge. Electronically Signed   By: Abigail Miyamoto M.D.   On: 10/26/2016 10:52    Microbiology: No results found for this or any previous visit (from the past 240 hour(s)).   Labs: Basic Metabolic Panel:  Recent Labs Lab 10/24/16 2149 10/25/16 0641  NA 139 138  K 4.2 4.3  CL 107 106  CO2 23 25  GLUCOSE 228* 180*  BUN 18 15  CREATININE 0.97 0.86  CALCIUM 8.8* 8.7*   Liver Function Tests:  Recent Labs Lab 10/24/16 2149  AST 26  ALT 17  ALKPHOS 72  BILITOT 0.8  PROT 6.6  ALBUMIN 3.4*   No results for input(s): LIPASE, AMYLASE in the last 168 hours. No results for input(s): AMMONIA in the last 168 hours. CBC:  Recent Labs Lab 10/24/16 1041 10/24/16 2149 10/25/16 0641 10/26/16 0136  WBC 3.6* 3.6* 4.3  --   NEUTROABS 2,304 2.1  --   --   HGB 5.8* 5.3* 7.5* 9.3*  HCT 18.7* 17.9* 24.2* 28.8*  MCV 83.9 89.1 84.9  --   PLT 136* 113* 116*  --    Cardiac Enzymes:  Recent Labs Lab 10/24/16 2149  TROPONINI <0.03   BNP: BNP (last 3 results) No results  for input(s): BNP in the last 8760 hours.  ProBNP (last 3 results) No results for input(s): PROBNP in the last 8760 hours.  CBG:  Recent Labs Lab 10/25/16 2153 10/26/16 0800 10/26/16 1126 10/26/16 1317 10/26/16 1625  GLUCAP 194* 217* 176* 153* 137*       Signed:  Farrell Broerman MD.  [facility] 10/26/2016, 6:56 PM

## 2016-10-26 NOTE — Progress Notes (Signed)
Inpatient Diabetes Program Recommendations  AACE/ADA: New Consensus Statement on Inpatient Glycemic Control (2015)  Target Ranges:  Prepandial:   less than 140 mg/dL      Peak postprandial:   less than 180 mg/dL (1-2 hours)      Critically ill patients:  140 - 180 mg/dL   Results for Tyler Farmer, Tyler Farmer (MRN [id number]) as of 10/26/2016 12:04  Ref. Range 10/25/2016 07:40 10/25/2016 11:43 10/25/2016 15:52 10/25/2016 16:49 10/25/2016 21:53  Glucose-Capillary Latest Ref Range: 65 - 99 mg/dL 186 (H) 260 (H) 233 (H) 222 (H) 194 (H)   Results for Tyler Farmer, Tyler Farmer (MRN [id number]) as of 10/26/2016 12:04  Ref. Range 10/26/2016 08:00 10/26/2016 11:26  Glucose-Capillary Latest Ref Range: 65 - 99 mg/dL 217 (H) 176 (H)    Admit with: Anemia  History: DM  Home DM Meds: Metformin 850 mg TID       Glipizide 10 mg BID  Current Insulin Orders: Novolog Sensitive Correction Scale/ SSI (0-9 units) TID AC + HS       MD- Note patient NPO today for EGD.  Having CBGs >200 mg/dl.  Please consider increasing Novolog SSI coverage to Moderate scale (0-15 units) Q4 hours while home oral DM meds are on hold      --Will follow patient during hospitalization--  Wyn Quaker RN, MSN, CDE Diabetes Coordinator Inpatient Glycemic Control Team Team Pager: [phone] (8a-5p)

## 2016-10-26 NOTE — Progress Notes (Signed)
Patient discharging home.  IV removed - WNL.  Reviewed DC instructions and medications.  Instructed to follow up with PCP and GI.  No questions at this time.  Verbalizes understanding.  Patient in NAD at time of DC

## 2016-10-26 NOTE — Progress Notes (Signed)
Pt's hemoglobin 9.3 following 2 units PRBC's, MD made aware, will continue to monitor.

## 2016-10-26 NOTE — Op Note (Signed)
[facility] Patient Name: Tyler Farmer Procedure Date: 10/26/2016 1:49 PM MRN: [id number] Date of Birth: 1940/06/15 Attending MD: Hildred Laser , MD CSN: [id number] Age: 76 Admit Type: Outpatient Procedure:                Upper GI endoscopy Indications:              Iron deficiency anemia secondary to chronic blood                            loss, Melena Providers:                Hildred Laser, MD, Janeece Riggers, RN, Rosina Lowenstein, RN Referring MD:             [name], MD Medicines:                Lidocaine spray, Meperidine 25 mg IV, Midazolam 4                            mg IV Complications:            No immediate complications. Estimated Blood Loss:     Estimated blood loss: none. Procedure:                Pre-Anesthesia Assessment:                           - Prior to the procedure, a History and Physical                            was performed, and patient medications and                            allergies were reviewed. The patient's tolerance of                            previous anesthesia was also reviewed. The risks                            and benefits of the procedure and the sedation                            options and risks were discussed with the patient.                            All questions were answered, and informed consent                            was obtained. Prior Anticoagulants: The patient                            last took aspirin 3 days prior to the procedure.                            ASA Grade Assessment: III - A patient with severe  systemic disease. After reviewing the risks and                            benefits, the patient was deemed in satisfactory                            condition to undergo the procedure.                           After obtaining informed consent, the endoscope was                            passed under direct vision. Throughout the                            procedure,  the patient's blood pressure, pulse, and                            oxygen saturations were monitored continuously. The                            [id number]-[id number] ([id number]) scope was introduced through the                            mouth, and advanced to the afferent and efferent                            jejunal loops. The upper GI endoscopy was                            accomplished without difficulty. The patient                            tolerated the procedure well. Scope In: 2:19:11 PM Scope Out: 2:28:20 PM Total Procedure Duration: 0 hours 9 minutes 9 seconds  Findings:      The proximal esophagus and mid esophagus were normal.      Grade I, grade II varices were found in the distal esophagus.      The Z-line was regular and was found 43 cm from the incisors.      Bilious fluid was found in the gastric body.      Evidence of a patent Billroth I gastroduodenostomy was found. A gastric       pouch with a large size was found containing bile.      Mild portal hypertensive gastropathy was found in the gastric fundus and       in the gastric body with single sall AV malformationithout stigma of       bleed.      The examined jejunum was normal. afferents and he friends loops.      Normal ampulla of Vater Impression:               - Normal proximal esophagus and mid esophagus.                           - Grade I and grade II  esophageal varices.                           - Z-line regular, 43 cm from the incisors.                           - Bilious gastric fluid.                           - Patent Billroth I gastroduodenostomy was found.                           - Portal hypertensive gastropathy with single small                            AV malformation at gastric body without stigmata of                            bleed.                           - Normal examined jejunum.                           - No specimens collected.                           comment: Source of GI bleeding not  intified.                           Suspect chronic bloooss from small bowel.                           Will arrange for small bowel given capsule study in                            the future. Moderate Sedation:      Moderate (conscious) sedation was administered by the endoscopy nurse       and supervised by the endoscopist. The following parameters were       monitored: oxygen saturation, heart rate, blood pressure, CO2       capnography and response to care. Total physician intraservice time was       18 minutes. Recommendation:           - Return patient to hospital ward for possible                            discharge same day.                           - Diabetic (ADA) diet today.                           - Continue present medications.                           - Hold aspirin and ferulfate for now.                           -  Pantoprazole 40 mg by mouth daily.                           - Small bowel given capsule study as an outpatient. Procedure Code(s):        --- Professional ---                           [phone], Esophagogastroduodenoscopy, flexible,                            transoral; diagnostic, including collection of                            specimen(s) by brushing or washing, when performed                            (separate procedure)                           99152, Moderate sedation services provided by the                            same physician or other qualified health care                            professional performing the diagnostic or                            therapeutic service that the sedation supports,                            requiring the presence of an independent trained                            observer to assist in the monitoring of the                            patient's level of consciousness and physiological                            status; initial 15 minutes of intraservice time,                            patient age 69  years or older Diagnosis Code(s):        --- Professional ---                           I85.00, Esophageal varices without bleeding                           Z98.0, Intestinal bypass and anastomosis status                           K76.6, Portal hypertension  K31.89, Other diseases of stomach and duodenum                           D50.0, Iron deficiency anemia secondary to blood                            loss (chronic)                           K92.1, Melena (includes Hematochezia) CPT copyright 2016 American Medical Association. All rights reserved. The codes documented in this report are preliminary and upon coder review may  be revised to meet current compliance requirements. Hildred Laser, MD Hildred Laser, MD 10/26/2016 3:07:50 PM This report has been signed electronically. Number of Addenda: 0

## 2016-10-28 ENCOUNTER — Ambulatory Visit (HOSPITAL_COMMUNITY)
Admission: RE | Admit: 2016-10-28 | Discharge: 2016-10-28 | Disposition: A | Discharge status: Home / Self Care | Payer: Medicare Other | Source: Ambulatory Visit | Attending: Internal Medicine | Admitting: Internal Medicine

## 2016-10-28 ENCOUNTER — Encounter (HOSPITAL_COMMUNITY): Payer: Self-pay | Admitting: Anesthesiology

## 2016-10-28 ENCOUNTER — Encounter (HOSPITAL_COMMUNITY)
Admission: RE | Disposition: A | Discharge status: Home / Self Care | Payer: Self-pay | Source: Ambulatory Visit | Attending: Internal Medicine

## 2016-10-28 DIAGNOSIS — K766 Portal hypertension: Secondary | ICD-10-CM | POA: Insufficient documentation

## 2016-10-28 DIAGNOSIS — K3189 Other diseases of stomach and duodenum: Secondary | ICD-10-CM | POA: Diagnosis not present

## 2016-10-28 DIAGNOSIS — D509 Iron deficiency anemia, unspecified: Secondary | ICD-10-CM | POA: Diagnosis not present

## 2016-10-28 DIAGNOSIS — K7581 Nonalcoholic steatohepatitis (NASH): Secondary | ICD-10-CM | POA: Diagnosis not present

## 2016-10-28 DIAGNOSIS — D649 Anemia, unspecified: Secondary | ICD-10-CM | POA: Insufficient documentation

## 2016-10-28 DIAGNOSIS — K922 Gastrointestinal hemorrhage, unspecified: Secondary | ICD-10-CM | POA: Diagnosis not present

## 2016-10-28 DIAGNOSIS — K7460 Unspecified cirrhosis of liver: Secondary | ICD-10-CM | POA: Diagnosis not present

## 2016-10-28 DIAGNOSIS — Q2730 Arteriovenous malformation, site unspecified: Secondary | ICD-10-CM | POA: Diagnosis not present

## 2016-10-28 HISTORY — PX: GIVENS CAPSULE STUDY: SHX5432

## 2016-10-28 LAB — AFP TUMOR MARKER: AFP, Serum, Tumor Marker: 1.1 ng/mL (ref 0.0–8.3)

## 2016-10-28 SURGERY — IMAGING PROCEDURE, GI TRACT, INTRALUMINAL, VIA CAPSULE

## 2016-10-31 ENCOUNTER — Encounter (HOSPITAL_COMMUNITY): Payer: Self-pay | Admitting: Internal Medicine

## 2016-11-03 DIAGNOSIS — S91132A Puncture wound without foreign body of left great toe without damage to nail, initial encounter: Secondary | ICD-10-CM | POA: Diagnosis not present

## 2016-11-03 DIAGNOSIS — Z23 Encounter for immunization: Secondary | ICD-10-CM | POA: Diagnosis not present

## 2016-11-03 DIAGNOSIS — E11628 Type 2 diabetes mellitus with other skin complications: Secondary | ICD-10-CM | POA: Diagnosis not present

## 2016-11-03 NOTE — Op Note (Signed)
Small Bowel [place] Capsule Study Procedure date:  10/28/2016  Referring Provider:  [name], MD PCP:  Dr. Quintin Alto, Silvestre Moment, MD  Indication for procedure:  Patient is 76 year old Caucasian male who has cirrhosis secondary to NASH who presented with history of melena and hemoglobin of 6 g. Source was felt to be either upper or mid GI tract. His last colonoscopy was in August 2017 revealing 3 small nonbleeding AV malformations and proximal colon mild portal colopathy and 2 small polyps were snared. He therefore underwent diagnostic esophagogastroduodenoscopy. His varices are felt to be small. No definite bleeding source was identified. He received 4 units of PRBCs. He is returning now to complete his workup.   Findings:   Patient was able to swallow given capsule without any difficulty. 2 small focal areas of/duodenal mucosa noted without ulceration or bleeding. Focal erythema and edema noted at 05/18/1955 and 2:30 11 felt to be gastrojejunal anastomosis. Diffuse edema noted to jejunal mucosa.  There is mucosal coating with dark blood seen on image at 02:27:58 Small polyp noted also on image at 0:27:58.  First Gastric image: 49 sec First Duodenal image: 2 hrs 25 min and 46 sec First Ileo-Cecal Valve image: 5 hrs 14 min and 54 sec First Cecal image: 5 hrs 15 min and 55 sec Gastric Passage time: 2 hrs and 25 min Small Bowel Passage time:  3 hrs and 50 min  Summary & Recommendations:  Portal hypertensive enteropathy. Most prominent changes involve jejunal mucosa. Single tiny area of blood best seen on image at 2:27:58. Most likely source is AV malformation. There was no pooling or frank bleeding. Small polyp seen on image at 02:27:58 as well Findings reviewed with patient over the phone. If he has another episode of melena with need for blood transfusion will consider push enteroscopy or referral to tertiary center for single or double balloon enteroscopy.

## 2016-11-07 ENCOUNTER — Encounter (HOSPITAL_COMMUNITY): Payer: Self-pay | Admitting: Internal Medicine

## 2016-11-07 DIAGNOSIS — D500 Iron deficiency anemia secondary to blood loss (chronic): Secondary | ICD-10-CM | POA: Diagnosis not present

## 2016-11-07 DIAGNOSIS — S91132D Puncture wound without foreign body of left great toe without damage to nail, subsequent encounter: Secondary | ICD-10-CM | POA: Diagnosis not present

## 2016-11-09 DIAGNOSIS — J449 Chronic obstructive pulmonary disease, unspecified: Secondary | ICD-10-CM | POA: Diagnosis not present

## 2016-12-10 DIAGNOSIS — J449 Chronic obstructive pulmonary disease, unspecified: Secondary | ICD-10-CM | POA: Diagnosis not present

## 2017-01-09 DIAGNOSIS — J449 Chronic obstructive pulmonary disease, unspecified: Secondary | ICD-10-CM | POA: Diagnosis not present

## 2017-01-19 DIAGNOSIS — E039 Hypothyroidism, unspecified: Secondary | ICD-10-CM | POA: Diagnosis not present

## 2017-01-19 DIAGNOSIS — E1165 Type 2 diabetes mellitus with hyperglycemia: Secondary | ICD-10-CM | POA: Diagnosis not present

## 2017-01-19 DIAGNOSIS — D500 Iron deficiency anemia secondary to blood loss (chronic): Secondary | ICD-10-CM | POA: Diagnosis not present

## 2017-01-19 DIAGNOSIS — E7800 Pure hypercholesterolemia, unspecified: Secondary | ICD-10-CM | POA: Diagnosis not present

## 2017-01-19 DIAGNOSIS — I10 Essential (primary) hypertension: Secondary | ICD-10-CM | POA: Diagnosis not present

## 2017-01-23 DIAGNOSIS — E039 Hypothyroidism, unspecified: Secondary | ICD-10-CM | POA: Diagnosis not present

## 2017-01-23 DIAGNOSIS — Z23 Encounter for immunization: Secondary | ICD-10-CM | POA: Diagnosis not present

## 2017-01-23 DIAGNOSIS — E1165 Type 2 diabetes mellitus with hyperglycemia: Secondary | ICD-10-CM | POA: Diagnosis not present

## 2017-01-23 DIAGNOSIS — K7460 Unspecified cirrhosis of liver: Secondary | ICD-10-CM | POA: Diagnosis not present

## 2017-01-23 DIAGNOSIS — I8500 Esophageal varices without bleeding: Secondary | ICD-10-CM | POA: Diagnosis not present

## 2017-01-23 DIAGNOSIS — I10 Essential (primary) hypertension: Secondary | ICD-10-CM | POA: Diagnosis not present

## 2017-02-09 DIAGNOSIS — J449 Chronic obstructive pulmonary disease, unspecified: Secondary | ICD-10-CM | POA: Diagnosis not present

## 2017-03-11 DIAGNOSIS — J449 Chronic obstructive pulmonary disease, unspecified: Secondary | ICD-10-CM | POA: Diagnosis not present

## 2017-04-11 DIAGNOSIS — J449 Chronic obstructive pulmonary disease, unspecified: Secondary | ICD-10-CM | POA: Diagnosis not present

## 2017-05-06 DIAGNOSIS — N3001 Acute cystitis with hematuria: Secondary | ICD-10-CM | POA: Diagnosis not present

## 2017-05-11 DIAGNOSIS — E1165 Type 2 diabetes mellitus with hyperglycemia: Secondary | ICD-10-CM | POA: Diagnosis not present

## 2017-05-11 DIAGNOSIS — D500 Iron deficiency anemia secondary to blood loss (chronic): Secondary | ICD-10-CM | POA: Diagnosis not present

## 2017-05-11 DIAGNOSIS — E039 Hypothyroidism, unspecified: Secondary | ICD-10-CM | POA: Diagnosis not present

## 2017-05-11 DIAGNOSIS — E11628 Type 2 diabetes mellitus with other skin complications: Secondary | ICD-10-CM | POA: Diagnosis not present

## 2017-05-11 DIAGNOSIS — E7800 Pure hypercholesterolemia, unspecified: Secondary | ICD-10-CM | POA: Diagnosis not present

## 2017-05-12 DIAGNOSIS — J449 Chronic obstructive pulmonary disease, unspecified: Secondary | ICD-10-CM | POA: Diagnosis not present

## 2017-05-15 DIAGNOSIS — E039 Hypothyroidism, unspecified: Secondary | ICD-10-CM | POA: Diagnosis not present

## 2017-05-15 DIAGNOSIS — E7800 Pure hypercholesterolemia, unspecified: Secondary | ICD-10-CM | POA: Diagnosis not present

## 2017-05-15 DIAGNOSIS — K7460 Unspecified cirrhosis of liver: Secondary | ICD-10-CM | POA: Diagnosis not present

## 2017-05-15 DIAGNOSIS — I10 Essential (primary) hypertension: Secondary | ICD-10-CM | POA: Diagnosis not present

## 2017-05-15 DIAGNOSIS — E1165 Type 2 diabetes mellitus with hyperglycemia: Secondary | ICD-10-CM | POA: Diagnosis not present

## 2017-06-09 DIAGNOSIS — J449 Chronic obstructive pulmonary disease, unspecified: Secondary | ICD-10-CM | POA: Diagnosis not present

## 2017-08-18 ENCOUNTER — Ambulatory Visit: Payer: Medicare Other | Admitting: Urology

## 2017-08-18 DIAGNOSIS — Z8546 Personal history of malignant neoplasm of prostate: Secondary | ICD-10-CM

## 2017-08-18 DIAGNOSIS — N471 Phimosis: Secondary | ICD-10-CM

## 2017-08-18 DIAGNOSIS — N480 Leukoplakia of penis: Secondary | ICD-10-CM

## 2017-08-18 DIAGNOSIS — R351 Nocturia: Secondary | ICD-10-CM

## 2017-09-14 DIAGNOSIS — E039 Hypothyroidism, unspecified: Secondary | ICD-10-CM | POA: Diagnosis not present

## 2017-09-14 DIAGNOSIS — D500 Iron deficiency anemia secondary to blood loss (chronic): Secondary | ICD-10-CM | POA: Diagnosis not present

## 2017-09-14 DIAGNOSIS — I10 Essential (primary) hypertension: Secondary | ICD-10-CM | POA: Diagnosis not present

## 2017-09-14 DIAGNOSIS — R300 Dysuria: Secondary | ICD-10-CM | POA: Diagnosis not present

## 2017-09-14 DIAGNOSIS — E1165 Type 2 diabetes mellitus with hyperglycemia: Secondary | ICD-10-CM | POA: Diagnosis not present

## 2017-09-14 DIAGNOSIS — E1129 Type 2 diabetes mellitus with other diabetic kidney complication: Secondary | ICD-10-CM | POA: Diagnosis not present

## 2017-09-20 DIAGNOSIS — Z0001 Encounter for general adult medical examination with abnormal findings: Secondary | ICD-10-CM | POA: Diagnosis not present

## 2017-09-20 DIAGNOSIS — I10 Essential (primary) hypertension: Secondary | ICD-10-CM | POA: Diagnosis not present

## 2017-09-20 DIAGNOSIS — E1165 Type 2 diabetes mellitus with hyperglycemia: Secondary | ICD-10-CM | POA: Diagnosis not present

## 2017-09-20 DIAGNOSIS — Z1389 Encounter for screening for other disorder: Secondary | ICD-10-CM | POA: Diagnosis not present

## 2017-10-30 DIAGNOSIS — N3000 Acute cystitis without hematuria: Secondary | ICD-10-CM | POA: Diagnosis not present

## 2018-01-18 ENCOUNTER — Other Ambulatory Visit: Payer: Self-pay

## 2018-01-18 DIAGNOSIS — E1129 Type 2 diabetes mellitus with other diabetic kidney complication: Secondary | ICD-10-CM | POA: Diagnosis not present

## 2018-01-18 DIAGNOSIS — E11628 Type 2 diabetes mellitus with other skin complications: Secondary | ICD-10-CM | POA: Diagnosis not present

## 2018-01-18 DIAGNOSIS — E1165 Type 2 diabetes mellitus with hyperglycemia: Secondary | ICD-10-CM | POA: Diagnosis not present

## 2018-01-18 DIAGNOSIS — E7800 Pure hypercholesterolemia, unspecified: Secondary | ICD-10-CM | POA: Diagnosis not present

## 2018-01-18 DIAGNOSIS — E039 Hypothyroidism, unspecified: Secondary | ICD-10-CM | POA: Diagnosis not present

## 2018-01-18 NOTE — Patient Outreach (Signed)
[place] [facility]) Care Management  01/18/2018  Harless Molinari Hellberg [birth year] [id number]   Medication Adherence call to Mr. Timmy Merrow  patient did not answer patient is due on Lisinopril 20 mg under [place].   [place] Management Direct Dial [phone]  Fax [phone] [email or web address]

## 2018-01-22 DIAGNOSIS — Z23 Encounter for immunization: Secondary | ICD-10-CM | POA: Diagnosis not present

## 2018-01-22 DIAGNOSIS — E1165 Type 2 diabetes mellitus with hyperglycemia: Secondary | ICD-10-CM | POA: Diagnosis not present

## 2018-01-22 DIAGNOSIS — E039 Hypothyroidism, unspecified: Secondary | ICD-10-CM | POA: Diagnosis not present

## 2018-01-22 DIAGNOSIS — K7460 Unspecified cirrhosis of liver: Secondary | ICD-10-CM | POA: Diagnosis not present

## 2018-01-22 DIAGNOSIS — I8500 Esophageal varices without bleeding: Secondary | ICD-10-CM | POA: Diagnosis not present

## 2018-01-22 DIAGNOSIS — I10 Essential (primary) hypertension: Secondary | ICD-10-CM | POA: Diagnosis not present

## 2018-04-19 DIAGNOSIS — E1129 Type 2 diabetes mellitus with other diabetic kidney complication: Secondary | ICD-10-CM | POA: Diagnosis not present

## 2018-04-19 DIAGNOSIS — E039 Hypothyroidism, unspecified: Secondary | ICD-10-CM | POA: Diagnosis not present

## 2018-05-17 DIAGNOSIS — R300 Dysuria: Secondary | ICD-10-CM | POA: Diagnosis not present

## 2018-05-17 DIAGNOSIS — D649 Anemia, unspecified: Secondary | ICD-10-CM | POA: Diagnosis not present

## 2018-05-17 DIAGNOSIS — E1165 Type 2 diabetes mellitus with hyperglycemia: Secondary | ICD-10-CM | POA: Diagnosis not present

## 2018-05-17 DIAGNOSIS — E039 Hypothyroidism, unspecified: Secondary | ICD-10-CM | POA: Diagnosis not present

## 2018-05-17 DIAGNOSIS — I10 Essential (primary) hypertension: Secondary | ICD-10-CM | POA: Diagnosis not present

## 2018-05-23 DIAGNOSIS — I10 Essential (primary) hypertension: Secondary | ICD-10-CM | POA: Diagnosis not present

## 2018-05-23 DIAGNOSIS — E039 Hypothyroidism, unspecified: Secondary | ICD-10-CM | POA: Diagnosis not present

## 2018-05-23 DIAGNOSIS — E7800 Pure hypercholesterolemia, unspecified: Secondary | ICD-10-CM | POA: Diagnosis not present

## 2018-05-23 DIAGNOSIS — K7460 Unspecified cirrhosis of liver: Secondary | ICD-10-CM | POA: Diagnosis not present

## 2018-05-23 DIAGNOSIS — E1165 Type 2 diabetes mellitus with hyperglycemia: Secondary | ICD-10-CM | POA: Diagnosis not present

## 2018-06-18 DIAGNOSIS — E039 Hypothyroidism, unspecified: Secondary | ICD-10-CM | POA: Diagnosis not present

## 2018-06-18 DIAGNOSIS — E1165 Type 2 diabetes mellitus with hyperglycemia: Secondary | ICD-10-CM | POA: Diagnosis not present

## 2018-08-18 DIAGNOSIS — I10 Essential (primary) hypertension: Secondary | ICD-10-CM | POA: Diagnosis not present

## 2018-08-18 DIAGNOSIS — E7800 Pure hypercholesterolemia, unspecified: Secondary | ICD-10-CM | POA: Diagnosis not present

## 2018-08-18 DIAGNOSIS — E1129 Type 2 diabetes mellitus with other diabetic kidney complication: Secondary | ICD-10-CM | POA: Diagnosis not present

## 2018-09-11 DIAGNOSIS — I10 Essential (primary) hypertension: Secondary | ICD-10-CM | POA: Diagnosis not present

## 2018-09-11 DIAGNOSIS — D500 Iron deficiency anemia secondary to blood loss (chronic): Secondary | ICD-10-CM | POA: Diagnosis not present

## 2018-09-11 DIAGNOSIS — E1129 Type 2 diabetes mellitus with other diabetic kidney complication: Secondary | ICD-10-CM | POA: Diagnosis not present

## 2018-09-11 DIAGNOSIS — E039 Hypothyroidism, unspecified: Secondary | ICD-10-CM | POA: Diagnosis not present

## 2018-09-11 DIAGNOSIS — D649 Anemia, unspecified: Secondary | ICD-10-CM | POA: Diagnosis not present

## 2018-09-11 DIAGNOSIS — R42 Dizziness and giddiness: Secondary | ICD-10-CM | POA: Diagnosis not present

## 2018-09-13 ENCOUNTER — Other Ambulatory Visit: Payer: Self-pay

## 2018-09-13 NOTE — Patient Outreach (Signed)
[place] [facility]) Care Management  09/13/2018  Havard Radigan Ragas 05-Jun-1940 [id number]   Medication Adherence call to Mr. Demaurion Dicioccio HIPPA Compliant Voice message left with a call back number. Mr. Stecklein is showing past due on Simvastatin 40 mg under [place].   [place] Management Direct Dial [phone]  Fax [phone] [email or web address]

## 2018-09-19 DIAGNOSIS — I10 Essential (primary) hypertension: Secondary | ICD-10-CM | POA: Diagnosis not present

## 2018-09-19 DIAGNOSIS — E039 Hypothyroidism, unspecified: Secondary | ICD-10-CM | POA: Diagnosis not present

## 2018-09-19 DIAGNOSIS — E7800 Pure hypercholesterolemia, unspecified: Secondary | ICD-10-CM | POA: Diagnosis not present

## 2018-09-19 DIAGNOSIS — E1165 Type 2 diabetes mellitus with hyperglycemia: Secondary | ICD-10-CM | POA: Diagnosis not present

## 2018-09-19 DIAGNOSIS — Z0001 Encounter for general adult medical examination with abnormal findings: Secondary | ICD-10-CM | POA: Diagnosis not present

## 2018-09-26 ENCOUNTER — Other Ambulatory Visit: Payer: Self-pay

## 2018-09-26 NOTE — Patient Outreach (Signed)
[place] [facility]) Care Management  09/26/2018  Tyler Farmer May 13, 1940 [id number]   Medication Adherence call to Mr. Tyler Farmer Telephone call to Patient regarding Medication Adherence unable to reach patient left a message for patient to call back patient is showing past due under [place].  [place] Management Direct Dial [phone]  Fax [phone] [email or web address]

## 2018-10-10 ENCOUNTER — Ambulatory Visit: Payer: Medicare Other | Admitting: Urology

## 2018-10-12 ENCOUNTER — Ambulatory Visit: Payer: Medicare Other | Admitting: Urology

## 2018-11-16 ENCOUNTER — Ambulatory Visit (INDEPENDENT_AMBULATORY_CARE_PROVIDER_SITE_OTHER): Payer: Medicare Other | Admitting: Urology

## 2018-11-16 DIAGNOSIS — Z8546 Personal history of malignant neoplasm of prostate: Secondary | ICD-10-CM

## 2018-11-16 DIAGNOSIS — R351 Nocturia: Secondary | ICD-10-CM | POA: Diagnosis not present

## 2018-11-16 DIAGNOSIS — N480 Leukoplakia of penis: Secondary | ICD-10-CM | POA: Diagnosis not present

## 2019-02-11 DIAGNOSIS — D649 Anemia, unspecified: Secondary | ICD-10-CM | POA: Diagnosis not present

## 2019-02-11 DIAGNOSIS — E1129 Type 2 diabetes mellitus with other diabetic kidney complication: Secondary | ICD-10-CM | POA: Diagnosis not present

## 2019-02-11 DIAGNOSIS — R42 Dizziness and giddiness: Secondary | ICD-10-CM | POA: Diagnosis not present

## 2019-02-11 DIAGNOSIS — I10 Essential (primary) hypertension: Secondary | ICD-10-CM | POA: Diagnosis not present

## 2019-02-11 DIAGNOSIS — E039 Hypothyroidism, unspecified: Secondary | ICD-10-CM | POA: Diagnosis not present

## 2019-02-11 DIAGNOSIS — D500 Iron deficiency anemia secondary to blood loss (chronic): Secondary | ICD-10-CM | POA: Diagnosis not present

## 2019-02-13 DIAGNOSIS — K7460 Unspecified cirrhosis of liver: Secondary | ICD-10-CM | POA: Diagnosis not present

## 2019-02-13 DIAGNOSIS — Z23 Encounter for immunization: Secondary | ICD-10-CM | POA: Diagnosis not present

## 2019-02-13 DIAGNOSIS — I8500 Esophageal varices without bleeding: Secondary | ICD-10-CM | POA: Diagnosis not present

## 2019-02-13 DIAGNOSIS — E1165 Type 2 diabetes mellitus with hyperglycemia: Secondary | ICD-10-CM | POA: Diagnosis not present

## 2019-02-13 DIAGNOSIS — I10 Essential (primary) hypertension: Secondary | ICD-10-CM | POA: Diagnosis not present

## 2019-02-13 DIAGNOSIS — D500 Iron deficiency anemia secondary to blood loss (chronic): Secondary | ICD-10-CM | POA: Diagnosis not present

## 2019-04-01 DIAGNOSIS — B3742 Candidal balanitis: Secondary | ICD-10-CM | POA: Diagnosis not present

## 2019-06-04 DIAGNOSIS — E7800 Pure hypercholesterolemia, unspecified: Secondary | ICD-10-CM | POA: Diagnosis not present

## 2019-06-04 DIAGNOSIS — I10 Essential (primary) hypertension: Secondary | ICD-10-CM | POA: Diagnosis not present

## 2019-06-04 DIAGNOSIS — E039 Hypothyroidism, unspecified: Secondary | ICD-10-CM | POA: Diagnosis not present

## 2019-06-04 DIAGNOSIS — Z13220 Encounter for screening for lipoid disorders: Secondary | ICD-10-CM | POA: Diagnosis not present

## 2019-06-04 DIAGNOSIS — E1165 Type 2 diabetes mellitus with hyperglycemia: Secondary | ICD-10-CM | POA: Diagnosis not present

## 2019-06-11 DIAGNOSIS — E7800 Pure hypercholesterolemia, unspecified: Secondary | ICD-10-CM | POA: Diagnosis not present

## 2019-06-11 DIAGNOSIS — E1165 Type 2 diabetes mellitus with hyperglycemia: Secondary | ICD-10-CM | POA: Diagnosis not present

## 2019-06-11 DIAGNOSIS — I10 Essential (primary) hypertension: Secondary | ICD-10-CM | POA: Diagnosis not present

## 2019-06-11 DIAGNOSIS — E039 Hypothyroidism, unspecified: Secondary | ICD-10-CM | POA: Diagnosis not present

## 2019-06-11 DIAGNOSIS — D500 Iron deficiency anemia secondary to blood loss (chronic): Secondary | ICD-10-CM | POA: Diagnosis not present

## 2019-06-28 DIAGNOSIS — R300 Dysuria: Secondary | ICD-10-CM | POA: Diagnosis not present

## 2019-06-28 DIAGNOSIS — B3742 Candidal balanitis: Secondary | ICD-10-CM | POA: Diagnosis not present

## 2019-09-04 DIAGNOSIS — S70261A Insect bite (nonvenomous), right hip, initial encounter: Secondary | ICD-10-CM | POA: Diagnosis not present

## 2019-09-04 DIAGNOSIS — L03317 Cellulitis of buttock: Secondary | ICD-10-CM | POA: Diagnosis not present

## 2019-10-01 DIAGNOSIS — L0390 Cellulitis, unspecified: Secondary | ICD-10-CM | POA: Diagnosis not present

## 2019-10-08 DIAGNOSIS — L0390 Cellulitis, unspecified: Secondary | ICD-10-CM | POA: Diagnosis not present

## 2019-10-09 DIAGNOSIS — E7800 Pure hypercholesterolemia, unspecified: Secondary | ICD-10-CM | POA: Diagnosis not present

## 2019-10-09 DIAGNOSIS — E1129 Type 2 diabetes mellitus with other diabetic kidney complication: Secondary | ICD-10-CM | POA: Diagnosis not present

## 2019-10-09 DIAGNOSIS — K7460 Unspecified cirrhosis of liver: Secondary | ICD-10-CM | POA: Diagnosis not present

## 2019-10-09 DIAGNOSIS — D649 Anemia, unspecified: Secondary | ICD-10-CM | POA: Diagnosis not present

## 2019-10-09 DIAGNOSIS — I10 Essential (primary) hypertension: Secondary | ICD-10-CM | POA: Diagnosis not present

## 2019-10-09 DIAGNOSIS — E1165 Type 2 diabetes mellitus with hyperglycemia: Secondary | ICD-10-CM | POA: Diagnosis not present

## 2019-10-09 DIAGNOSIS — Z13220 Encounter for screening for lipoid disorders: Secondary | ICD-10-CM | POA: Diagnosis not present

## 2019-10-09 DIAGNOSIS — D520 Dietary folate deficiency anemia: Secondary | ICD-10-CM | POA: Diagnosis not present

## 2019-10-09 DIAGNOSIS — E039 Hypothyroidism, unspecified: Secondary | ICD-10-CM | POA: Diagnosis not present

## 2019-10-09 DIAGNOSIS — D518 Other vitamin B12 deficiency anemias: Secondary | ICD-10-CM | POA: Diagnosis not present

## 2019-10-14 DIAGNOSIS — I8500 Esophageal varices without bleeding: Secondary | ICD-10-CM | POA: Diagnosis not present

## 2019-10-14 DIAGNOSIS — D500 Iron deficiency anemia secondary to blood loss (chronic): Secondary | ICD-10-CM | POA: Diagnosis not present

## 2019-10-14 DIAGNOSIS — E1129 Type 2 diabetes mellitus with other diabetic kidney complication: Secondary | ICD-10-CM | POA: Diagnosis not present

## 2019-10-14 DIAGNOSIS — Z1389 Encounter for screening for other disorder: Secondary | ICD-10-CM | POA: Diagnosis not present

## 2019-10-14 DIAGNOSIS — I10 Essential (primary) hypertension: Secondary | ICD-10-CM | POA: Diagnosis not present

## 2019-10-14 DIAGNOSIS — E1165 Type 2 diabetes mellitus with hyperglycemia: Secondary | ICD-10-CM | POA: Diagnosis not present

## 2019-11-15 ENCOUNTER — Other Ambulatory Visit: Payer: Medicare Other

## 2019-11-22 ENCOUNTER — Ambulatory Visit (INDEPENDENT_AMBULATORY_CARE_PROVIDER_SITE_OTHER): Payer: Medicare Other | Admitting: Urology

## 2019-11-22 ENCOUNTER — Other Ambulatory Visit: Payer: Self-pay

## 2019-11-22 ENCOUNTER — Encounter: Payer: Self-pay | Admitting: Urology

## 2019-11-22 VITALS — BP 150/70 | HR 93 | Temp 98.7°F | Ht 70.0 in | Wt 205.0 lb

## 2019-11-22 DIAGNOSIS — N480 Leukoplakia of penis: Secondary | ICD-10-CM

## 2019-11-22 DIAGNOSIS — R351 Nocturia: Secondary | ICD-10-CM

## 2019-11-22 DIAGNOSIS — Z8546 Personal history of malignant neoplasm of prostate: Secondary | ICD-10-CM | POA: Diagnosis not present

## 2019-11-22 DIAGNOSIS — Z87442 Personal history of urinary calculi: Secondary | ICD-10-CM

## 2019-11-22 LAB — URINALYSIS, ROUTINE W REFLEX MICROSCOPIC
Bilirubin, UA: NEGATIVE
Glucose, UA: NEGATIVE
Ketones, UA: NEGATIVE
Leukocytes,UA: NEGATIVE
Nitrite, UA: NEGATIVE
RBC, UA: NEGATIVE
Specific Gravity, UA: >1.03 — ABNORMAL HIGH (ref 1.005–1.030)
Urobilinogen, Ur: 0.2 mg/dL (ref 0.2–1.0)
pH, UA: 5 (ref 5.0–7.5)

## 2019-11-22 LAB — MICROSCOPIC EXAMINATION
Bacteria, UA: NONE SEEN
RBC: NONE SEEN /hpf (ref 0–2)
Renal Epithel, UA: NONE SEEN /hpf
WBC, UA: NONE SEEN /hpf (ref 0–5)

## 2019-11-22 NOTE — Progress Notes (Signed)
Urological Symptom Review  Patient is experiencing the following symptoms: None   Review of Systems  Gastrointestinal (upper)  : Negative for upper GI symptoms  Gastrointestinal (lower) : Negative for lower GI symptoms  Constitutional : Negative for symptoms  Skin: Negative for skin symptoms  Eyes: Negative for eye symptoms  Ear/Nose/Throat : Negative for Ear/Nose/Throat symptoms  Hematologic/Lymphatic: Negative for Hematologic/Lymphatic symptoms  Cardiovascular : Negative for cardiovascular symptoms  Respiratory : Negative for respiratory symptoms  Endocrine: Negative for endocrine symptoms  Musculoskeletal: Negative for musculoskeletal symptoms  Neurological: Negative for neurological symptoms  Psychologic: Negative for psychiatric symptoms

## 2019-11-22 NOTE — Progress Notes (Signed)
Subjective:  1. Personal history of malignant neoplasm of prostate   2. Nocturia   3. Balanitis xerotica obliterans   4. History of nephrolithiasis     Tyler Farmer returns today in f/u for his history of prostate cancer treated with radiation therapy in 2005. He had high grade disease and was treated with 2 years of androgen ablation and EXRT. His PSA is 0.4 in Dr. Edythe Lynn office a month ago which is stable. He is voiding well without complaints. He has nocturia x 1-2.  He is taking a cranberry supplement which helps.  He has no hematuria. He has recurrent phimosis with a history of a prior circumcision for BXO. It is stable and doesn't bother him too much.  He has a history of stones but no suggestive symptoms. He has no other associated signs or symptoms. IPSS is 7.    IPSS    Row Name 11/22/19 1400         International Prostate Symptom Score   How often have you had the sensation of not emptying your bladder? Less than half the time     How often have you had to urinate less than every two hours? Less than half the time     How often have you found you stopped and started again several times when you urinated? Less than 1 in 5 times     How often have you found it difficult to postpone urination? Not at All     How often have you had a weak urinary stream? Not at All     How often have you had to strain to start urination? Not at All     How many times did you typically get up at night to urinate? 2 Times     Total IPSS Score 7       Quality of Life due to urinary symptoms   If you were to spend the rest of your life with your urinary condition just the way it is now how would you feel about that? Mostly Satisfied             ROS:  ROS:  A complete review of systems was performed.  All systems are negative except for pertinent findings as noted.   Review of Systems  Gastrointestinal: Positive for diarrhea.    Allergies  Allergen Reactions  . Penicillins Nausea And  Vomiting    Has patient had a PCN reaction causing immediate rash, facial/tongue/throat swelling, SOB or lightheadedness with hypotension: Yes Has patient had a PCN reaction causing severe rash involving mucus membranes or skin necrosis: No Has patient had a PCN reaction that required hospitalization: No Has patient had a PCN reaction occurring within the last 10 years: No If all of the above answers are "NO", then may proceed with Cephalosporin use.   Patient states that it also caused pain in his sides.    Outpatient Encounter Medications as of 11/22/2019  Medication Sig Note  . ferrous sulfate 325 (65 FE) MG EC tablet Take 1 tablet (325 mg total) by mouth 2 (two) times daily.   Marland Kitchen glipiZIDE (GLUCOTROL) 10 MG tablet Take 1 tablet (10 mg total) by mouth 2 (two) times daily before a meal. DO NOT TAKE IF YOUR BLOOD SUGAR IS BELOW 130.   . levothyroxine (SYNTHROID, LEVOTHROID) 137 MCG tablet Take 137 mcg by mouth daily before breakfast.  04/14/2015: Received from: External Pharmacy  . lisinopril (PRINIVIL,ZESTRIL) 20 MG tablet Take 20 mg by mouth  daily.   . metFORMIN (GLUCOPHAGE) 850 MG tablet Take 850 mg by mouth 3 (three) times daily with meals.   . pantoprazole (PROTONIX) 40 MG tablet Take 1 tablet (40 mg total) by mouth daily.   . simvastatin (ZOCOR) 40 MG tablet Take 40 mg by mouth at bedtime.     No facility-administered encounter medications on file as of 11/22/2019.    Past Medical History:  Diagnosis Date  . Arthritis    Hands/Fingers  . Back pain   . Cirrhosis of liver ([place])   . Diabetes mellitus ([place])   . Enlarged prostate   . Gastric ulcer   . GERD (gastroesophageal reflux disease)   . H/O bladder problems   . Hypercholesteremia   . Hypertension   . Kidney stone   . Prostate cancer ([place])   . Thyroid disease   . UGI bleed 10/26/2016  . Vitamin B12 deficiency 10/26/2016    Past Surgical History:  Procedure Laterality Date  . CIRCUMCISION     at age 33  . COLONOSCOPY N/A  11/13/2015   Procedure: COLONOSCOPY;  Surgeon: Rogene Houston, MD;  Location[facility];  Service: Endoscopy;  Laterality: N/A;  2:10  . ESOPHAGOGASTRODUODENOSCOPY N/A 06/03/2014   Procedure: ESOPHAGOGASTRODUODENOSCOPY (EGD);  Surgeon: Rogene Houston, MD;  Location: [facility];  Service: Endoscopy;  Laterality: N/A;  730  . ESOPHAGOGASTRODUODENOSCOPY N/A 10/26/2016   Procedure: ESOPHAGOGASTRODUODENOSCOPY (EGD);  Surgeon: Rogene Houston, MD;  Location: [facility];  Service: Endoscopy;  Laterality: N/A;  . Gastric Ulcer  1993  . GIVENS CAPSULE STUDY N/A 10/28/2016   Procedure: GIVENS CAPSULE STUDY;  Surgeon: Rogene Houston, MD;  Location: [facility];  Service: Endoscopy;  Laterality: N/A;  . HERNIA REPAIR    . POLYPECTOMY  11/13/2015   Procedure: POLYPECTOMY;  Surgeon: Rogene Houston, MD;  Location: [facility];  Service: Endoscopy;;  colon  . Right Elbow Right    A pin was put in    Social History   Socioeconomic History  . Marital status: Married    Spouse name: Not on file  . Number of children: Not on file  . Years of education: Not on file  . Highest education level: Not on file  Occupational History  . Not on file  Tobacco Use  . Smoking status: Former Smoker    Quit date: 05/25/1989    Years since quitting: 30.5  . Smokeless tobacco: Never Used  Vaping Use  . Vaping Use: Never used  Substance and Sexual Activity  . Alcohol use: No    Alcohol/week: 0.0 standard drinks    Comment: Drank many years - 30 years ago  . Drug use: No  . Sexual activity: Not on file  Other Topics Concern  . Not on file  Social History Narrative  . Not on file   Social Determinants of Health   Financial Resource Strain:   . Difficulty of Paying Living Expenses: Not on file  Food Insecurity:   . Worried About Charity fundraiser in the Last Year: Not on file  . Ran Out of Food in the Last Year: Not on file  Transportation Needs:   . Lack of Transportation (Medical): Not on  file  . Lack of Transportation (Non-Medical): Not on file  Physical Activity:   . Days of Exercise per Week: Not on file  . Minutes of Exercise per Session: Not on file  Stress:   . Feeling of Stress :  Not on file  Social Connections:   . Frequency of Communication with Friends and Family: Not on file  . Frequency of Social Gatherings with Friends and Family: Not on file  . Attends Religious Services: Not on file  . Active Member of Clubs or Organizations: Not on file  . Attends Archivist Meetings: Not on file  . Marital Status: Not on file  Intimate Partner Violence:   . Fear of Current or Ex-Partner: Not on file  . Emotionally Abused: Not on file  . Physically Abused: Not on file  . Sexually Abused: Not on file    Family History  Problem Relation Age of Onset  . Emphysema Mother   . Cerebrovascular Accident Father   . Heart disease Father   . Diabetes Sister   . Heart disease Brother   . Heart disease Sister   . Heart disease Brother   . Diabetes Brother        Objective: Vitals:   11/22/19 1412  BP: (!) 150/70  Pulse: 93  Temp: 98.7 F (37.1 C)     Physical Exam Vitals reviewed.  Constitutional:      Appearance: Normal appearance. He is obese.  Abdominal:     Hernia: No hernia is present.  Genitourinary:    Comments: BXO with recurrent phimosis. Meatus not well seen. Scrotum, testes and epididymis normal. AP without lesions. NST without mass.  Prostate smooth and flat.   SV non-palpable.  Neurological:     Mental Status: He is alert.     Lab Results:  PSA 0.4.    Studies/Results: No results found.    Assessment & Plan: History of prostate cancer.   He is doing well with a stable low PSA and unremarkable exam.  F/U in 1 year with a PSA.  Nocturia.  He has mild LUTS that is improved with a cranberry supplement.  BXO.  He has stable skin changes without active inflammation.  History of stones.  He has had no hematuria or flank  pain.    No orders of the defined types were placed in this encounter.    Orders Placed This Encounter  Procedures  . Urinalysis, Routine w reflex microscopic  . PSA    Standing Status:   Future    Standing Expiration Date:   11/21/2020      Return in about 1 year (around 11/21/2020) for with PSA.   CC: Sasser, Silvestre Moment, MD      Irine Seal 11/22/2019

## 2020-02-18 ENCOUNTER — Encounter: Payer: Self-pay | Admitting: Cardiology

## 2020-02-18 DIAGNOSIS — E7800 Pure hypercholesterolemia, unspecified: Secondary | ICD-10-CM | POA: Diagnosis not present

## 2020-02-18 DIAGNOSIS — E039 Hypothyroidism, unspecified: Secondary | ICD-10-CM | POA: Diagnosis not present

## 2020-02-18 DIAGNOSIS — D500 Iron deficiency anemia secondary to blood loss (chronic): Secondary | ICD-10-CM | POA: Diagnosis not present

## 2020-02-18 DIAGNOSIS — D529 Folate deficiency anemia, unspecified: Secondary | ICD-10-CM | POA: Diagnosis not present

## 2020-02-18 DIAGNOSIS — I10 Essential (primary) hypertension: Secondary | ICD-10-CM | POA: Diagnosis not present

## 2020-02-18 DIAGNOSIS — E1165 Type 2 diabetes mellitus with hyperglycemia: Secondary | ICD-10-CM | POA: Diagnosis not present

## 2020-02-19 ENCOUNTER — Encounter: Payer: Self-pay | Admitting: Cardiology

## 2020-02-21 DIAGNOSIS — D500 Iron deficiency anemia secondary to blood loss (chronic): Secondary | ICD-10-CM | POA: Diagnosis not present

## 2020-02-21 DIAGNOSIS — I8500 Esophageal varices without bleeding: Secondary | ICD-10-CM | POA: Diagnosis not present

## 2020-02-21 DIAGNOSIS — E1165 Type 2 diabetes mellitus with hyperglycemia: Secondary | ICD-10-CM | POA: Diagnosis not present

## 2020-02-21 DIAGNOSIS — I10 Essential (primary) hypertension: Secondary | ICD-10-CM | POA: Diagnosis not present

## 2020-02-21 DIAGNOSIS — Z23 Encounter for immunization: Secondary | ICD-10-CM | POA: Diagnosis not present

## 2020-02-21 DIAGNOSIS — E039 Hypothyroidism, unspecified: Secondary | ICD-10-CM | POA: Diagnosis not present

## 2020-03-27 ENCOUNTER — Telehealth: Payer: Self-pay

## 2020-03-27 DIAGNOSIS — I10 Essential (primary) hypertension: Secondary | ICD-10-CM | POA: Diagnosis not present

## 2020-03-27 DIAGNOSIS — E1165 Type 2 diabetes mellitus with hyperglycemia: Secondary | ICD-10-CM | POA: Diagnosis not present

## 2020-03-27 DIAGNOSIS — R0602 Shortness of breath: Secondary | ICD-10-CM | POA: Diagnosis not present

## 2020-03-27 DIAGNOSIS — I4891 Unspecified atrial fibrillation: Secondary | ICD-10-CM | POA: Diagnosis not present

## 2020-03-27 NOTE — Telephone Encounter (Signed)
NOTES ON FILE FROM [facility] [phone] , SENT REFERRAL TO SCHEDULING  

## 2020-03-30 DIAGNOSIS — L0390 Cellulitis, unspecified: Secondary | ICD-10-CM | POA: Diagnosis not present

## 2020-03-31 DIAGNOSIS — Z23 Encounter for immunization: Secondary | ICD-10-CM | POA: Diagnosis not present

## 2020-04-01 ENCOUNTER — Telehealth: Payer: Self-pay

## 2020-04-01 NOTE — Telephone Encounter (Signed)
NOTES ON FILE FROM [facility] [phone] SENT REFERRAL TO [place]

## 2020-04-03 DIAGNOSIS — R0602 Shortness of breath: Secondary | ICD-10-CM | POA: Diagnosis not present

## 2020-04-03 DIAGNOSIS — R0902 Hypoxemia: Secondary | ICD-10-CM | POA: Diagnosis not present

## 2020-04-09 ENCOUNTER — Encounter: Payer: Self-pay | Admitting: *Deleted

## 2020-04-10 ENCOUNTER — Encounter: Payer: Self-pay | Admitting: Cardiology

## 2020-04-10 ENCOUNTER — Telehealth: Payer: Self-pay | Admitting: Cardiology

## 2020-04-10 ENCOUNTER — Ambulatory Visit (INDEPENDENT_AMBULATORY_CARE_PROVIDER_SITE_OTHER): Payer: Medicare Other | Admitting: Cardiology

## 2020-04-10 ENCOUNTER — Other Ambulatory Visit: Payer: Self-pay

## 2020-04-10 VITALS — BP 146/60 | HR 72 | Ht 69.0 in | Wt 228.0 lb

## 2020-04-10 DIAGNOSIS — R0602 Shortness of breath: Secondary | ICD-10-CM | POA: Diagnosis not present

## 2020-04-10 DIAGNOSIS — I493 Ventricular premature depolarization: Secondary | ICD-10-CM | POA: Diagnosis not present

## 2020-04-10 MED ORDER — FUROSEMIDE 20 MG PO TABS: 20.0000 mg | ORAL_TABLET | Freq: Every day | ORAL | 1 refills | Status: DC

## 2020-04-10 MED ORDER — APIXABAN 5 MG PO TABS: 5.0000 mg | ORAL_TABLET | Freq: Two times a day (BID) | ORAL | 0 refills | Status: DC

## 2020-04-10 NOTE — Progress Notes (Signed)
Clinical Summary Tyler Farmer is a 80 y.o.male seen today as a new consult, referred by Dr Quintin Alto for the following medical problems.   1. SOB - worsening SOB over the last month - former smoker x 20 years - some productive, whitish sputum - has had some abdominal distension - SOB wit laying flat, +PND - some recent LE edema.    2. History of cirrhosis   3. PVCs  noted on EKG today - denies any palpitations   4. Chest pain - left sided, dull pain. Can occur at rest or with activity. Not positional. Lasts just 1 second. Fairly infrequent.   5. Presumed arrhythmia - patient reports during recent pcp visit he was told he had an abnormal EKG. Was started on diltiazerm and eliquis. Available clinic note does not mention details, we are requesting further information.    Past Medical History:  Diagnosis Date  . Arthritis    Hands/Fingers  . Back pain   . Cirrhosis of liver ([place])   . Diabetes mellitus ([place])   . Enlarged prostate   . Gastric ulcer   . GERD (gastroesophageal reflux disease)   . H/O bladder problems   . Hypercholesteremia   . Hypertension   . Kidney stone   . Prostate cancer ([place])   . Thyroid disease   . UGI bleed 10/26/2016  . Vitamin B12 deficiency 10/26/2016     Allergies  Allergen Reactions  . Penicillins Nausea And Vomiting    Has patient had a PCN reaction causing immediate rash, facial/tongue/throat swelling, SOB or lightheadedness with hypotension: Yes Has patient had a PCN reaction causing severe rash involving mucus membranes or skin necrosis: No Has patient had a PCN reaction that required hospitalization: No Has patient had a PCN reaction occurring within the last 10 years: No If all of the above answers are "NO", then may proceed with Cephalosporin use.   Patient states that it also caused pain in his sides.     Current Outpatient Medications  Medication Sig Dispense Refill  . ferrous sulfate 325 (65 FE) MG EC tablet Take 1 tablet  (325 mg total) by mouth 2 (two) times daily.  3  . glipiZIDE (GLUCOTROL) 10 MG tablet Take 1 tablet (10 mg total) by mouth 2 (two) times daily before a meal. DO NOT TAKE IF YOUR BLOOD SUGAR IS BELOW 130.    . levothyroxine (SYNTHROID, LEVOTHROID) 137 MCG tablet Take 137 mcg by mouth daily before breakfast.     . lisinopril (PRINIVIL,ZESTRIL) 20 MG tablet Take 20 mg by mouth daily.    . metFORMIN (GLUCOPHAGE) 850 MG tablet Take 850 mg by mouth 3 (three) times daily with meals.    . pantoprazole (PROTONIX) 40 MG tablet Take 1 tablet (40 mg total) by mouth daily. 30 tablet 3  . simvastatin (ZOCOR) 40 MG tablet Take 40 mg by mouth at bedtime.      No current facility-administered medications for this visit.     Past Surgical History:  Procedure Laterality Date  . CIRCUMCISION     at age 68  . COLONOSCOPY N/A 11/13/2015   Procedure: COLONOSCOPY;  Surgeon: Rogene Houston, MD;  [facility];  Service: Endoscopy;  Laterality: N/A;  2:10  . ESOPHAGOGASTRODUODENOSCOPY N/A 06/03/2014   Procedure: ESOPHAGOGASTRODUODENOSCOPY (EGD);  Surgeon: Rogene Houston, MD;  [facility];  Service: Endoscopy;  Laterality: N/A;  730  . ESOPHAGOGASTRODUODENOSCOPY N/A 10/26/2016   Procedure: ESOPHAGOGASTRODUODENOSCOPY (EGD);  Surgeon: Laural Golden,  Mechele Dawley, MD;  [facility];  Service: Endoscopy;  Laterality: N/A;  . Gastric Ulcer  1993  . GIVENS CAPSULE STUDY N/A 10/28/2016   Procedure: GIVENS CAPSULE STUDY;  Surgeon: Rogene Houston, MD;  [facility];  Service: Endoscopy;  Laterality: N/A;  . HERNIA REPAIR    . POLYPECTOMY  11/13/2015   Procedure: POLYPECTOMY;  Surgeon: Rogene Houston, MD;  [facility];  Service: Endoscopy;;  colon  . Right Elbow Right    A pin was put in     Allergies  Allergen Reactions  . Penicillins Nausea And Vomiting    Has patient had a PCN reaction causing immediate rash, facial/tongue/throat swelling, SOB or lightheadedness with  hypotension: Yes Has patient had a PCN reaction causing severe rash involving mucus membranes or skin necrosis: No Has patient had a PCN reaction that required hospitalization: No Has patient had a PCN reaction occurring within the last 10 years: No If all of the above answers are "NO", then may proceed with Cephalosporin use.   Patient states that it also caused pain in his sides.      Family History  Problem Relation Age of Onset  . Emphysema Mother   . Cerebrovascular Accident Father   . Heart disease Father   . Diabetes Sister   . Heart disease Brother   . Heart disease Sister   . Heart disease Brother   . Diabetes Brother      Social History Tyler Farmer reports that he quit smoking about 30 years ago. He has never used smokeless tobacco. Tyler Farmer reports no history of alcohol use.   Review of Systems CONSTITUTIONAL: No weight loss, fever, chills, weakness or fatigue.  HEENT: Eyes: No visual loss, blurred vision, double vision or yellow sclerae.No hearing loss, sneezing, congestion, runny nose or sore throat.  SKIN: No rash or itching.  CARDIOVASCULAR: per hpi RESPIRATORY:per hpi GASTROINTESTINAL: No anorexia, nausea, vomiting or diarrhea. No abdominal pain or blood.  GENITOURINARY: No burning on urination, no polyuria NEUROLOGICAL: No headache, dizziness, syncope, paralysis, ataxia, numbness or tingling in the extremities. No change in bowel or bladder control.  MUSCULOSKELETAL: No muscle, back pain, joint pain or stiffness.  LYMPHATICS: No enlarged nodes. No history of splenectomy.  PSYCHIATRIC: No history of depression or anxiety.  ENDOCRINOLOGIC: No reports of sweating, cold or heat intolerance. No polyuria or polydipsia.  Marland Kitchen   Physical Examination Today's Vitals   04/10/20 0848  BP: (!) 146/60  Pulse: 72  SpO2: 96%  Weight: 228 lb (103.4 kg)  Height: 5' 9"  (1.753 m)   Body mass index is 33.67 kg/m.  Gen: resting comfortably, no acute  distress HEENT: no scleral icterus, pupils equal round and reactive, no palptable cervical adenopathy,  CV: RRR, 2/6 sysotlic murmur apex, no jvd Resp: Clear to auscultation bilaterally GI: abdomen is soft, non-tender, non-distended, normal bowel sounds, no hepatosplenomegaly MSK: extremities are warm, 1+ bilateral LE edema Skin: warm, no rash Neuro:  no focal deficits Psych: appropriate affect   Diagnostic Studies     Assessment and Plan  1. SOB - some signs of fluid overlad. Up 23 lbs since 11/2019. Has some LE edema, mild crackles on exam - start lasix 47m daily, bmet/mg/tsh in 2 weeks - check echo - if benign cardiac workup consider PFTs given prior smoking history  2. PVCs - noted on EKG today - asymptomatic - f/u echo - may consider holter to quantify burden.    Pcp  started patient on diltiazem and eliquis recently, available notes don't mention details. We are asking for further clinic notes and EKGs. Unclear if patient had afib at pcp office at this time based on records we have.       Arnoldo Lenis, M.D.

## 2020-04-10 NOTE — Patient Instructions (Signed)
Your physician recommends that you schedule a follow-up appointment in: [place] physician has recommended you make the following change in your medication:   START LASIX 20 MG DAILY   Your physician recommends that you return for lab work IN 2 WEEKS BMP/MG/TSH   Your physician has requested that you have an echocardiogram. Echocardiography is a painless test that uses sound waves to create images of your heart. It provides your doctor with information about the size and shape of your heart and how well your hearts chambers and valves are working. This procedure takes approximately one hour. There are no restrictions for this procedure.  Thank you for choosing [facility]!!

## 2020-04-10 NOTE — Telephone Encounter (Signed)
Pre-cert Verification for the following procedure    ECHO   DATE:04/14/2020  LOCATION:[facility]

## 2020-04-14 ENCOUNTER — Ambulatory Visit (HOSPITAL_COMMUNITY)
Admission: RE | Admit: 2020-04-14 | Discharge: 2020-04-14 | Disposition: A | Discharge status: Home / Self Care | Payer: Medicare Other | Source: Ambulatory Visit | Attending: Cardiology | Admitting: Cardiology

## 2020-04-14 ENCOUNTER — Other Ambulatory Visit: Payer: Self-pay

## 2020-04-14 DIAGNOSIS — R0602 Shortness of breath: Secondary | ICD-10-CM | POA: Insufficient documentation

## 2020-04-14 LAB — ECHOCARDIOGRAM COMPLETE
AR max vel: 1.81 cm2
AV Area VTI: 1.94 cm2
AV Area mean vel: 1.76 cm2
AV Mean grad: 6.1 mmHg
AV Peak grad: 12 mmHg
Ao pk vel: 1.73 m/s
Area-P 1/2: 3.6 cm2
MV M vel: 5.26 m/s
MV Peak grad: 110.7 mmHg
S' Lateral: 3 cm

## 2020-04-14 NOTE — Progress Notes (Signed)
*  PRELIMINARY RESULTS* Echocardiogram 2D Echocardiogram has been performed.  Samuel Germany 04/14/2020, 11:32 AM

## 2020-04-17 ENCOUNTER — Telehealth: Payer: Self-pay | Admitting: *Deleted

## 2020-04-17 NOTE — Telephone Encounter (Signed)
-----   Message from Arnoldo Lenis, MD sent at 04/17/2020 10:52 AM EST ----- Echo shows heart pumping function is strong. There is some evidence of some stiffness of the heart muscle which is common with aging and can cause some fluid to build up and SOB.   Zandra Abts MD

## 2020-04-17 NOTE — Telephone Encounter (Signed)
Patient informed. Copy sent to PCP °

## 2020-04-24 DIAGNOSIS — E1165 Type 2 diabetes mellitus with hyperglycemia: Secondary | ICD-10-CM | POA: Diagnosis not present

## 2020-04-24 DIAGNOSIS — I4891 Unspecified atrial fibrillation: Secondary | ICD-10-CM | POA: Diagnosis not present

## 2020-04-24 DIAGNOSIS — E1129 Type 2 diabetes mellitus with other diabetic kidney complication: Secondary | ICD-10-CM | POA: Diagnosis not present

## 2020-04-24 DIAGNOSIS — E039 Hypothyroidism, unspecified: Secondary | ICD-10-CM | POA: Diagnosis not present

## 2020-04-24 DIAGNOSIS — E7800 Pure hypercholesterolemia, unspecified: Secondary | ICD-10-CM | POA: Diagnosis not present

## 2020-04-29 NOTE — Progress Notes (Signed)
Cardiology Office Note  Date: 05/01/2020   ID: Tyler Farmer, DOB 1940/08/10, MRN [id number]  PCP:  Estanislado Pandy, MD  Cardiologist:  Dina Rich, MD Electrophysiologist:  None   Chief Complaint: Cardiac follow up  History of Present Illness: Tyler Farmer is a 80 y.o. male with a history of shortness or breath, hx of cirrhosis, PVC's, chest pain, presumed arrhythmia, DM, GERD, HLD, HTN. Hx of smoking x 20 years.  Last encounter 04/10/2020 with Dr. Wyline Mood. Having SOB over the prior month with abdominal distension, orthopnea, PND, and lower extremity edema. Having some left sided dull CP with rest or activity, very brief and infrequent. He had been told by his PCP that he had an abnormal EKG and was started on diltiazem and eliquis. PVC's were noted on his EKG. Had signs of fluid volume overload and started on Lasix. Echocardiogram was ordered. BMET, Mg and TSH were ordered. Mentioned possible PFT if cardiac work up was benign and  monitor to measure PVC burden.   He is here for follow for recent echocardiogram EF 60-65 % Mild LVH. mildly elevated PASP, LA severely dilated, Mild MR. He had recent labs at PCP office and thyroid medication was increased. He continues with SOB and signficant activity intolerance. States he gives out very easily when active. He has Liver disease with cirrhosis and obvious ascitic abdomen. Does not see GI. BP is elevated over the last three visits in epic.   Past Medical History:  Diagnosis Date  . Arthritis    Hands/Fingers  . Back pain   . Cirrhosis of liver ([facility])   . Diabetes mellitus ([facility])   . Enlarged prostate   . Gastric ulcer   . GERD (gastroesophageal reflux disease)   . H/O bladder problems   . Hypercholesteremia   . Hypertension   . Kidney stone   . Prostate cancer ([facility])   . Thyroid disease   . UGI bleed 10/26/2016  . Vitamin B12 deficiency 10/26/2016    Past Surgical History:  Procedure Laterality Date  . CIRCUMCISION      at age 41  . COLONOSCOPY N/A 11/13/2015   Procedure: COLONOSCOPY;  Surgeon: Malissa Hippo, MD;  [facility];  Service: Endoscopy;  Laterality: N/A;  2:10  . ESOPHAGOGASTRODUODENOSCOPY N/A 06/03/2014   Procedure: ESOPHAGOGASTRODUODENOSCOPY (EGD);  Surgeon: Malissa Hippo, MD;  [facility];  Service: Endoscopy;  Laterality: N/A;  730  . ESOPHAGOGASTRODUODENOSCOPY N/A 10/26/2016   Procedure: ESOPHAGOGASTRODUODENOSCOPY (EGD);  Surgeon: Malissa Hippo, MD;  [facility];  Service: Endoscopy;  Laterality: N/A;  . Gastric Ulcer  1993  . GIVENS CAPSULE STUDY N/A 10/28/2016   Procedure: GIVENS CAPSULE STUDY;  Surgeon: Malissa Hippo, MD;  [facility];  Service: Endoscopy;  Laterality: N/A;  . HERNIA REPAIR    . POLYPECTOMY  11/13/2015   Procedure: POLYPECTOMY;  Surgeon: Malissa Hippo, MD;  [facility];  Service: Endoscopy;;  colon  . Right Elbow Right    A pin was put in    Current Outpatient Medications  Medication Sig Dispense Refill  . apixaban (ELIQUIS) 5 MG TABS tablet Take 1 tablet (5 mg total) by mouth 2 (two) times daily. 42 tablet 0  . Cranberry 500 MG TABS Take 1 tablet by mouth daily.    Marland Kitchen diltiazem (CARDIZEM CD) 120 MG 24 hr capsule Take 120 mg by mouth daily.    . ferrous sulfate 325 (65 FE)  MG tablet Take 325 mg by mouth daily with breakfast.    . furosemide (LASIX) 20 MG tablet Take 1 tablet (20 mg total) by mouth daily. 90 tablet 1  . glipiZIDE (GLUCOTROL) 10 MG tablet Take 1 tablet (10 mg total) by mouth 2 (two) times daily before a meal. DO NOT TAKE IF YOUR BLOOD SUGAR IS BELOW 130.    . levothyroxine (SYNTHROID) 150 MCG tablet Take 150 mcg by mouth daily before breakfast.    . magnesium oxide (MAG-OX) 400 MG tablet Take 400 mg by mouth daily.    . metFORMIN (GLUCOPHAGE) 850 MG tablet Take 850 mg by mouth 3 (three) times daily with meals.    . naproxen sodium (ALEVE) 220 MG tablet Take 440 mg by mouth at bedtime.    .  simvastatin (ZOCOR) 40 MG tablet Take 40 mg by mouth at bedtime.     Marland Kitchen lisinopril (ZESTRIL) 40 MG tablet Take 1 tablet (40 mg total) by mouth daily. 30 tablet 6   No current facility-administered medications for this visit.   Allergies:  Penicillins   Social History: The patient  reports that he quit smoking about 30 years ago. He has never used smokeless tobacco. He reports that he does not drink alcohol and does not use drugs.   Family History: The patient's family history includes Cerebrovascular Accident in his father; Diabetes in his brother and sister; Emphysema in his mother; Heart disease in his brother, brother, father, and sister.   ROS:  Please see the history of present illness. Otherwise, complete review of systems is positive for none.  All other systems are reviewed and negative.   Physical Exam: VS:  BP (!) 142/82   Pulse (!) 102   Ht 5\' 9"  (1.753 m)   Wt 220 lb 6.4 oz (100 kg)   SpO2 97%   BMI 32.55 kg/m , BMI Body mass index is 32.55 kg/m.  Wt Readings from Last 3 Encounters:  05/01/20 220 lb 6.4 oz (100 kg)  04/10/20 228 lb (103.4 kg)  11/22/19 205 lb (93 kg)    General: Patient appears comfortable at rest. Neck: Supple, no elevated JVP or carotid bruits, no thyromegaly. Lungs: Clear to auscultation, nonlabored breathing at rest. Cardiac: Regular rate and rhythm, no S3 or significant systolic murmur, no pericardial rub. Abdomen: Ascitic distended abdomen, + hepatomegaly, bowel sounds present, no guarding or rebound. Extremities: No pitting edema, distal pulses 2+. Skin: Warm and dry. Musculoskeletal: No kyphosis. Neuropsychiatric: Alert and oriented x3, affect grossly appropriate.  ECG:    Recent Labwork: No results found for requested labs within last 8760 hours.  No results found for: CHOL, TRIG, HDL, CHOLHDL, VLDL, LDLCALC, LDLDIRECT  Other Studies Reviewed Today:  Echocardiogram 04/14/2020 1. Left ventricular ejection fraction, by estimation, is 60  to 65%. The left ventricle has normal function. The left ventricle has no regional wall motion abnormalities. There is mild left ventricular hypertrophy. Left ventricular diastolic parameters are indeterminate. 2. Right ventricular systolic function is normal. The right ventricular size is normal. There is mildly elevated pulmonary artery systolic pressure. 3. Left atrial size was severely dilated. 4. The mitral valve is normal in structure. Mild mitral valve regurgitation. No evidence of mitral stenosis. 5. The aortic valve is tricuspid. Aortic valve regurgitation is not visualized. No aortic stenosis is present. 6. The inferior vena cava is dilated in size with >50% respiratory variability, suggesting right atrial pressure of 8 mmHg.  Assessment and Plan:  1. SOB (shortness of breath)  2. PVC's (premature ventricular contractions)   3. Other ascites   4. Cirrhosis of liver with ascites, unspecified hepatic cirrhosis type ([facility])   5. Primary hypertension     1. SOB (shortness of breath) Continued SOB with recent echocardiogram EF 60-65 %, Mild LVH. mildly elevated PASP, LA severely dilated, Mild MR. Please refer to pulmonology to evaluate SOB. He is a former smoker and exposed to dust and debris from working in a [employer].   2. PVC's (premature ventricular contractions) Continues with PVC's / skipped heart beats . See last EKG.  Get 14 day Zio monitor to assess PVC burden.  3. Other ascites History of cirrhosis of the liver with obvious ascites. Does not see GI. Please refer to Dr Karilyn Cota for evaluation.  4. Primary HTN BP remain consistently elevated over the last three visits in epic. Increase Lisinopril to 40 mg daily and get BMET and Mg in two weeks  Medication Adjustments/Labs and Tests Ordered: Current medicines are reviewed at length with the patient today.  Concerns regarding medicines are outlined above.   Disposition: Follow-up with Dr Wyline Mood or [facility] in 6-8  weeks.  Signed, Rennis Harding, NP 05/01/2020 9:51 AM    [facility] at [facility] [address] [place], [place], [place] [address] Phone: [phone]; Fax: [phone]

## 2020-05-01 ENCOUNTER — Encounter: Payer: Self-pay | Admitting: Family Medicine

## 2020-05-01 ENCOUNTER — Ambulatory Visit: Payer: Medicare Other | Admitting: Family Medicine

## 2020-05-01 VITALS — BP 142/82 | HR 102 | Ht 69.0 in | Wt 220.4 lb

## 2020-05-01 DIAGNOSIS — R188 Other ascites: Secondary | ICD-10-CM | POA: Diagnosis not present

## 2020-05-01 DIAGNOSIS — R0602 Shortness of breath: Secondary | ICD-10-CM

## 2020-05-01 DIAGNOSIS — I10 Essential (primary) hypertension: Secondary | ICD-10-CM

## 2020-05-01 DIAGNOSIS — I493 Ventricular premature depolarization: Secondary | ICD-10-CM

## 2020-05-01 DIAGNOSIS — K7460 Unspecified cirrhosis of liver: Secondary | ICD-10-CM

## 2020-05-01 MED ORDER — APIXABAN 5 MG PO TABS: 5.0000 mg | ORAL_TABLET | Freq: Two times a day (BID) | ORAL | 3 refills | Status: DC

## 2020-05-01 MED ORDER — LISINOPRIL 40 MG PO TABS: 40.0000 mg | ORAL_TABLET | Freq: Every day | ORAL | 6 refills | Status: DC

## 2020-05-01 NOTE — Patient Instructions (Addendum)
Medication Instructions:   Increase Lisinopril to 13m daily.  Continue all other current medications.  Labwork:  BMET, MG - orders given today.   Please do in 2 weeks, around 05/15/2020  Testing/Procedures: Your physician has recommended that you wear a 14 day event monitor. Event monitors are medical devices that record the heart's electrical activity. Doctors most often uKoreathese monitors to diagnose arrhythmias. Arrhythmias are problems with the speed or rhythm of the heartbeat. The monitor is a small, portable device. You can wear one while you do your normal daily activities. This is usually used to diagnose what is causing palpitations/syncope (passing out).  Follow-Up:  Office will contact with results via phone or letter.    6-8 weeks   Any Other Special Instructions Will Be Listed Below (If Applicable).  You have been referred to:  Pulmonology   You have been referred to:  Gastroenterology  If you need a refill on your cardiac medications before your next appointment, please call your pharmacy.

## 2020-05-04 ENCOUNTER — Ambulatory Visit: Payer: Medicare Other

## 2020-05-04 ENCOUNTER — Telehealth: Payer: Self-pay | Admitting: Family Medicine

## 2020-05-04 ENCOUNTER — Other Ambulatory Visit: Payer: Self-pay | Admitting: Cardiology

## 2020-05-04 DIAGNOSIS — I493 Ventricular premature depolarization: Secondary | ICD-10-CM

## 2020-05-04 MED ORDER — APIXABAN 5 MG PO TABS: 5.0000 mg | ORAL_TABLET | Freq: Two times a day (BID) | ORAL | 0 refills | Status: DC

## 2020-05-04 NOTE — Telephone Encounter (Signed)
Pt cannot afford Eliquis says it was $48 for 30 day supply - pt took last tablet on Saturday - will come by office for samples and to pick up pt assistance form

## 2020-05-04 NOTE — Telephone Encounter (Signed)
Pt's wife called stating the apixaban (ELIQUIS) 5 MG TABS tablet [[id number]] Is to expensive.   Please call [phone]

## 2020-05-05 ENCOUNTER — Encounter (INDEPENDENT_AMBULATORY_CARE_PROVIDER_SITE_OTHER): Payer: Self-pay | Admitting: Internal Medicine

## 2020-05-05 ENCOUNTER — Other Ambulatory Visit: Payer: Self-pay

## 2020-05-05 ENCOUNTER — Ambulatory Visit (INDEPENDENT_AMBULATORY_CARE_PROVIDER_SITE_OTHER): Payer: Medicare Other | Admitting: Internal Medicine

## 2020-05-05 ENCOUNTER — Telehealth: Payer: Self-pay | Admitting: Cardiology

## 2020-05-05 VITALS — BP 137/73 | HR 103 | Temp 98.4°F | Ht 66.0 in | Wt 220.0 lb

## 2020-05-05 DIAGNOSIS — R1031 Right lower quadrant pain: Secondary | ICD-10-CM

## 2020-05-05 DIAGNOSIS — K7581 Nonalcoholic steatohepatitis (NASH): Secondary | ICD-10-CM

## 2020-05-05 DIAGNOSIS — K7460 Unspecified cirrhosis of liver: Secondary | ICD-10-CM | POA: Diagnosis not present

## 2020-05-05 DIAGNOSIS — D509 Iron deficiency anemia, unspecified: Secondary | ICD-10-CM

## 2020-05-05 LAB — COMPREHENSIVE METABOLIC PANEL
Calcium: 8.2 mg/dL — ABNORMAL LOW (ref 8.6–10.3)
Chloride: 102 mmol/L (ref 98–110)

## 2020-05-05 LAB — CBC: Platelets: 126 10*3/uL — ABNORMAL LOW (ref 140–400)

## 2020-05-05 NOTE — Telephone Encounter (Signed)
New message    Dr Laural Golden wants to know why patient was put on Eliquis

## 2020-05-05 NOTE — Telephone Encounter (Signed)
Pt was noted to have presumed arrhythmia during office visit with PCP. Dr. Olevia Perches office notified.

## 2020-05-05 NOTE — Progress Notes (Signed)
Presenting complaint;  History of cirrhosis, right lower quadrant abdominal pain and abdominal distention concerning for ascites.  History of present illness.  Patient is 80 year old Caucasian male who has history of cirrhosis secondary to NASH.  This diagnosis was made back in February 2016 when he underwent unenhanced abdominal pelvic CT by his urologist. The study also suggested retroperitoneal fibrosis with prominent lymph nodes. Work-up included negative hepatitis B surface antigen and HCV antibody.  ANA smooth muscle antibodies were negative.  Iron studies revealed iron deficiency.  Angiotensin-converting enzyme was normal.  AFP was also normal.  He underwent EGD on 06/03/2014 which revealed grade 1-2 esophageal varices portal gastropathy and jejunal mucosal edema.  CLOtest was negative.  Small bowel biopsy did not show any changes of celiac disease. 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  Antimitochondrial antibody was negative. I felt etiology was most likely due to [facility] and significance of granulomas was not clear. He was seen again in August 2018 for melena and iron deficiency anemia.  His hemoglobin was 5.8 g.  He received 4 units of PRBCs.  He underwent esophagogastroduodenoscopy and colonoscopy.  EGD reveals grade 1 through 2 esophageal varices mild portal gastropathy and patent gastro jejunostomy. Colonoscopy revealed 3 small AV malformations in proximal colon without stigmata of bleeding and he had 2 polyps.  Path results cannot be located at this time. Small bowel given capsule study was performed on 10/28/2016 revealing focal erythema and edema and gastrojejunal anastomosis diffuse jejunal mucosal edema.  There was small collection of blood coating mucosa and distal small bowel but no frank lesion was noted. Patient was on low-dose aspirin.  He unfortunately did not return for follow-up visit regarding his cirrhosis and anemia.  Patient was seen at  cardiology clinic on 05/01/2020 by Levell July, Brooke Bonito., NP who felt patient may have ascites and requested a follow-up. Patient says he was begun on Eliquis/apixaban by Dr. Consuello Masse but he does not know the precise indication. Patient recalls he had an episode of right lower quadrant abdominal pain about 6 weeks ago.  Pain was real bad to begin with.  It lasted about 1 week.  Use heating pad which seemed to help.  Pain was fairly localized and not associated with fever chills nausea vomiting melena or rectal bleeding.  He says his stools have been loose.  He has no more than 1 to 2/day.  He denies rectal bleeding.  His stools times are dark because he is on p.o. iron.  He states his weight has been up and down.  His weight on 11/03/2016 was 220 pounds and today he weighs the same.  Now he has some mild discomfort in right lower quadrant.  He also complains of abdominal distention which he said started about 2 months ago.  He says appetite is good.  He denies heartburn or dysphagia.  He states he has been diabetic for more than 20 years. Lately he also has noted exertional dyspnea which he believes may be due to abdominal distention.  He had echocardiogram on 04/14/2020 which revealed LVEF of 60 to 65%.  Left atrium was enlarged there was no other significant abnormality.   Current Medications: Outpatient Encounter Medications as of 05/05/2020  Medication Sig  . apixaban (ELIQUIS) 5 MG TABS tablet Take 1 tablet (5 mg total) by mouth 2 (two) times daily.  . Cranberry 500 MG TABS Take 1 tablet by mouth daily.  . ferrous sulfate 325 (65 FE) MG tablet Take 325  mg by mouth daily with breakfast.  . furosemide (LASIX) 20 MG tablet Take 1 tablet (20 mg total) by mouth daily.  Marland Kitchen glipiZIDE (GLUCOTROL) 10 MG tablet Take 1 tablet (10 mg total) by mouth 2 (two) times daily before a meal. DO NOT TAKE IF YOUR BLOOD SUGAR IS BELOW 130.  . levothyroxine (SYNTHROID) 150 MCG tablet Take 150 mcg by mouth daily before  breakfast.  . lisinopril (ZESTRIL) 40 MG tablet Take 1 tablet (40 mg total) by mouth daily.  . magnesium oxide (MAG-OX) 400 MG tablet Take 400 mg by mouth daily.  . metFORMIN (GLUCOPHAGE) 850 MG tablet Take 850 mg by mouth 3 (three) times daily with meals.  Marland Kitchen OVER THE COUNTER MEDICATION All Day Pain Relief - Naproxen 220 mg - Patient reports that he takes 2 by mouth at bedtime.  . simvastatin (ZOCOR) 40 MG tablet Take 40 mg by mouth at bedtime.   Marland Kitchen diltiazem (CARDIZEM CD) 120 MG 24 hr capsule Take 120 mg by mouth daily. (Patient not taking: Reported on 05/05/2020)  . [DISCONTINUED] naproxen sodium (ALEVE) 220 MG tablet Take 440 mg by mouth at bedtime. (Patient not taking: Reported on 05/05/2020)   No facility-administered encounter medications on file as of 05/05/2020.   Past Medical History:  Diagnosis Date  . Arthritis    Hands/Fingers  . Back pain   . Cirrhosis of liver ([place])   . Diabetes mellitus ([place])   . Enlarged prostate   .  Upper GI bleed secondary to gastric ulcer.  Status post gastric surgery in 1993.   Marland Kitchen GERD (gastroesophageal reflux disease)   . H/O bladder problems   . Hypercholesteremia   . Hypertension   . Kidney stone   . Prostate cancer ([place])   . Thyroid disease   .  History of retroperitoneal fibrosis. 10/26/2016  . Vitamin B12 deficiency 10/26/2016   Past Surgical History:  Procedure Laterality Date  . CIRCUMCISION     at age 85  . COLONOSCOPY N/A 11/13/2015   Procedure: COLONOSCOPY;  Surgeon: Rogene Houston, MD;  [facility];  Service: Endoscopy;  Laterality: N/A;  2:10  . ESOPHAGOGASTRODUODENOSCOPY N/A 06/03/2014   Procedure: ESOPHAGOGASTRODUODENOSCOPY (EGD);  Surgeon: Rogene Houston, MD;  [facility];  Service: Endoscopy;  Laterality: N/A;  730  . ESOPHAGOGASTRODUODENOSCOPY N/A 10/26/2016   Procedure: ESOPHAGOGASTRODUODENOSCOPY (EGD);  Surgeon: Rogene Houston, MD;  [facility];  Service: Endoscopy;  Laterality: N/A;  .  Gastric Ulcer  1993  . GIVENS CAPSULE STUDY N/A 10/28/2016   Procedure: GIVENS CAPSULE STUDY;  Surgeon: Rogene Houston, MD;  [facility];  Service: Endoscopy;  Laterality: N/A;  . HERNIA REPAIR    . POLYPECTOMY  11/13/2015   Procedure: POLYPECTOMY;  Surgeon: Rogene Houston, MD;  [facility];  Service: Endoscopy;;  colon  . Right Elbow Right    A pin was put in   Allergies  Allergen Reactions  . Penicillins Nausea And Vomiting    Has patient had a PCN reaction causing immediate rash, facial/tongue/throat swelling, SOB or lightheadedness with hypotension: Yes Has patient had a PCN reaction causing severe rash involving mucus membranes or skin necrosis: No Has patient had a PCN reaction that required hospitalization: No Has patient had a PCN reaction occurring within the last 10 years: No If all of the above answers are "NO", then may proceed with Cephalosporin use.   Patient states that it also caused pain in his  sides.     Family history;  Father died of MI at age 59. Mother died of emphysema at age 39. He lost 1 brother 2 days ago.  He was 25 years old and had dementia.  He had 2 other brothers in good health.  He had 3 sisters and only one is living.  1 sister is living.  2 other sisters are disease.  Social history;  Patient is married(second marriage; first marriage ended in divorce). He has 3 grownup daughters age 65, 7 and 77.  His older daughter was very ill with Covid pneumonia and was hospitalized for several days. He is retired.  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  All in all he worked for more than 30 years. He smokes cigarettes about a pack a day for 20 years but quit in 1993. He used to drink alcohol occasionally but hasn't had any in 25 years   Physical examination  Blood pressure 137/73, pulse (!) 103, temperature 98.4 F (36.9 C), temperature source Oral, height 5' 6"  (1.676 m), weight 220 lb (99.8 kg). Patient is alert and in no acute distress. He does not have asterixis. Conjunctiva is pink. Sclera is nonicteric Oropharyngeal mucosa is normal. He has tooth teeth and lower jaw. No neck masses or thyromegaly noted. Cardiac exam with regular rhythm normal S1 and S2. No murmur or gallop noted. Lungs are clear to auscultation. Abdomen is distended.  He has upper midline scar.  Bowel sounds are normal.  On palpation he has mild tenderness in right lower quadrant.  Induration noted across upper midline scar felt to be possibly due to mesh and calcification.  Spleen is not palpable.  Liver edge is easily palpable.  Liver is firm and nontender.  Liver margin at midclavicular line is 7 cm below RCM.  Flanks are dull and there appears to be shifting dullness. No LE edema or clubbing noted. He has changes of DJD involving distal and proximal interphalangeal joints. He has flexion deformity to right elbow.  He is not able to extended to 90 degrees.  Labs/studies Results:  CBC Latest Ref Rng & Units 10/26/2016 10/25/2016 10/24/2016  WBC 4.0 - 10.5 K/uL - 4.3 3.6(L)  Hemoglobin 13.0 - 17.0 g/dL 9.3(L) 7.5(L) 5.3(LL)  Hematocrit 39.0 - 52.0 % 28.8(L) 24.2(L) 17.9(L)  Platelets 150 - 400 K/uL - 116(L) 113(L)    CMP Latest Ref Rng & Units 10/25/2016 10/24/2016 04/07/2015  Glucose 65 - 99 mg/dL 180(H) 228(H) 238(H)  BUN 6 - 20 mg/dL _0 Creatinine 0.61 - 1.24 mg/dL 0.86 0.97 0.83  Sodium 135 - 145 mmol/L 138 139 143  Potassium 3.5 - 5.1 mmol/L 4.3 4.2 4.2  Chloride 101 - 111 mmol/L 106 107 106  CO2 22 - 32 mmol/L _1 Calcium 8.9 - 10.3 mg/dL 8.7(L) 8.8(L) 9.6  Total Protein 6.5 - 8.1 g/dL - 6.6 -  Total Bilirubin 0.3 - 1.2 mg/dL - 0.8 -  Alkaline Phos 38 - 126 U/L - 72 -  AST 15 - 41 U/L - 26 -  ALT 17 - 63 U/L - 17 -    Hepatic Function Latest Ref Rng & Units 10/24/2016  Total Protein 6.5 - 8.1 g/dL 6.6  Albumin 3.5 - 5.0 g/dL 3.4(L)  AST 15 - 41 U/L 26   ALT 17 - 63 U/L 17  Alk Phosphatase 38 - 126 U/L 72  Total Bilirubin  0.3 - 1.2 mg/dL 0.8    H&H on 04/10/2020 was 10.9 and 32.1.  MCV was 93.  WBC was 3.9 and platelet count 90 4K.  Assessment:  #1.  History of right lower quadrant abdominal pain.  He had an episode with significant pain about 6 weeks ago lasting for 1 week.  I wonder if this pain is related to history of retroperitoneal fibrosis.  This could also be referred pain from back.  Further evaluation is warranted with abdominal pelvic CT.  #2.  Cirrhosis.  Cirrhosis diagnosed in February 2016.  Work-up was negative including antimitochondrial antibody and ACE levels.  Liver biopsy in March 2016 revealed granulomas but without features of primary biliary cholangitis.  I felt etiology of his liver disease was nonalcoholic steatohepatitis given long history of diabetes mellitus but he could have 2 etiologies.  Abdominal exam now suggests ascites.  He may be developing progressive portal hypertension.  He needs to be reevaluated for varices.  Varices have a large this should be banded It is unclear whether or not he has received hepatitis A and B vaccination.  If not this needs to be accomplished..  #3.  History of iron deficiency anemia.  Hemoglobin 3 weeks ago was 10.9.  He remains on p.o. iron.  He is on Eliquis presumably for SVT.  He is also taking Aleve/Naprosyn.   Recommendations  Patient advised not to take Naprosyn or other OTC NSAIDs. He can take Tylenol up to 2 g/day in divided dose on as-needed basis. Patient will go to the lab for CBC, comprehensive chemistry panel, INR, AFP and iron TIBC and ferritin levels. Schedule patient for abdominal pelvic CT with contrast as soon as possible. Further recommendations to follow.  Addendum  I called and talked with Dr. Consuello Masse. Patient does have history of atrial fibrillation.

## 2020-05-05 NOTE — Patient Instructions (Addendum)
Please do not take Advil Aleve or other over-the-counter arthritis medications. Can take Tylenol up to 2 g/day in divided dose on as-needed basis. Physician will call with results of blood test and CT when completed Notify if abdominal pain recurs.

## 2020-05-06 LAB — CBC
HCT: 24 % — ABNORMAL LOW (ref 38.5–50.0)
Hemoglobin: 7.8 g/dL — ABNORMAL LOW (ref 13.2–17.1)
MCH: 30 pg (ref 27.0–33.0)
MCHC: 32.5 g/dL (ref 32.0–36.0)
MCV: 92.3 fL (ref 80.0–100.0)
MPV: 10.4 fL (ref 7.5–12.5)
RBC: 2.6 10*6/uL — ABNORMAL LOW (ref 4.20–5.80)
RDW: 13.4 % (ref 11.0–15.0)
WBC: 4.3 10*3/uL (ref 3.8–10.8)

## 2020-05-06 LAB — IRON,TIBC AND FERRITIN PANEL
%SAT: 16 % (calc) — ABNORMAL LOW (ref 20–48)
Ferritin: 15 ng/mL — ABNORMAL LOW (ref 24–380)
Iron: 71 ug/dL (ref 50–180)
TIBC: 432 mcg/dL (calc) — ABNORMAL HIGH (ref 250–425)

## 2020-05-06 LAB — COMPREHENSIVE METABOLIC PANEL
AG Ratio: 0.8 (calc) — ABNORMAL LOW (ref 1.0–2.5)
ALT: 15 U/L (ref 9–46)
AST: 24 U/L (ref 10–35)
Albumin: 3.3 g/dL — ABNORMAL LOW (ref 3.6–5.1)
Alkaline phosphatase (APISO): 102 U/L (ref 35–144)
BUN: 18 mg/dL (ref 7–25)
CO2: 28 mmol/L (ref 20–32)
Creat: 0.96 mg/dL (ref 0.70–1.18)
Globulin: 4 g/dL (calc) — ABNORMAL HIGH (ref 1.9–3.7)
Glucose, Bld: 128 mg/dL (ref 65–139)
Potassium: 3.7 mmol/L (ref 3.5–5.3)
Sodium: 139 mmol/L (ref 135–146)
Total Bilirubin: 1.1 mg/dL (ref 0.2–1.2)
Total Protein: 7.3 g/dL (ref 6.1–8.1)

## 2020-05-06 LAB — PROTIME-INR
INR: 1.2 — ABNORMAL HIGH
Prothrombin Time: 11.8 s — ABNORMAL HIGH (ref 9.0–11.5)

## 2020-05-06 LAB — AFP TUMOR MARKER: AFP-Tumor Marker: 1 ng/mL (ref <6.1)

## 2020-05-08 ENCOUNTER — Other Ambulatory Visit: Payer: Self-pay

## 2020-05-08 ENCOUNTER — Ambulatory Visit (HOSPITAL_COMMUNITY)
Admission: RE | Admit: 2020-05-08 | Discharge: 2020-05-08 | Disposition: A | Discharge status: Home / Self Care | Payer: Medicare Other | Source: Ambulatory Visit | Attending: Internal Medicine | Admitting: Internal Medicine

## 2020-05-08 DIAGNOSIS — K7460 Unspecified cirrhosis of liver: Secondary | ICD-10-CM | POA: Insufficient documentation

## 2020-05-08 DIAGNOSIS — K7581 Nonalcoholic steatohepatitis (NASH): Secondary | ICD-10-CM | POA: Insufficient documentation

## 2020-05-08 DIAGNOSIS — R1031 Right lower quadrant pain: Secondary | ICD-10-CM | POA: Diagnosis not present

## 2020-05-08 DIAGNOSIS — R109 Unspecified abdominal pain: Secondary | ICD-10-CM | POA: Diagnosis not present

## 2020-05-08 MED ORDER — IOHEXOL 300 MG/ML  SOLN
100.0000 mL | Freq: Once | INTRAMUSCULAR | Status: AC | PRN
  Administered 2020-05-08: 100 mL via INTRAVENOUS

## 2020-05-10 ENCOUNTER — Other Ambulatory Visit (INDEPENDENT_AMBULATORY_CARE_PROVIDER_SITE_OTHER): Payer: Self-pay | Admitting: Internal Medicine

## 2020-05-10 MED ORDER — SPIRONOLACTONE 50 MG PO TABS: 50.0000 mg | ORAL_TABLET | Freq: Every day | ORAL | 5 refills | Status: DC

## 2020-05-11 ENCOUNTER — Other Ambulatory Visit (INDEPENDENT_AMBULATORY_CARE_PROVIDER_SITE_OTHER): Payer: Self-pay | Admitting: *Deleted

## 2020-05-11 DIAGNOSIS — R1031 Right lower quadrant pain: Secondary | ICD-10-CM

## 2020-05-11 DIAGNOSIS — K7581 Nonalcoholic steatohepatitis (NASH): Secondary | ICD-10-CM

## 2020-05-11 DIAGNOSIS — D509 Iron deficiency anemia, unspecified: Secondary | ICD-10-CM

## 2020-05-11 DIAGNOSIS — K7460 Unspecified cirrhosis of liver: Secondary | ICD-10-CM

## 2020-05-12 ENCOUNTER — Telehealth (INDEPENDENT_AMBULATORY_CARE_PROVIDER_SITE_OTHER): Payer: Self-pay

## 2020-05-12 ENCOUNTER — Telehealth (INDEPENDENT_AMBULATORY_CARE_PROVIDER_SITE_OTHER): Payer: Self-pay | Admitting: Internal Medicine

## 2020-05-12 NOTE — Telephone Encounter (Signed)
Patient is aware of CT results

## 2020-05-12 NOTE — Telephone Encounter (Signed)
Tyler Farmer with [facility] called and states Dr.Rehman had prescribed Spironolactone 50 mg and they are getting a flag stating the patient is also on an Ace Inhibitor Lisinopril 40 mg one per day and the patient needs to be monitored for K and renal function. They needed to make sure you were aware of this . [phone] . Please advise.

## 2020-05-12 NOTE — Telephone Encounter (Signed)
Patient left voice mail message stating he has not heard anything about scheduling his CT - please advise - ph# [phone]

## 2020-05-13 ENCOUNTER — Telehealth (INDEPENDENT_AMBULATORY_CARE_PROVIDER_SITE_OTHER): Payer: Self-pay | Admitting: *Deleted

## 2020-05-13 ENCOUNTER — Other Ambulatory Visit (INDEPENDENT_AMBULATORY_CARE_PROVIDER_SITE_OTHER): Payer: Self-pay

## 2020-05-13 DIAGNOSIS — R1031 Right lower quadrant pain: Secondary | ICD-10-CM

## 2020-05-13 DIAGNOSIS — K7581 Nonalcoholic steatohepatitis (NASH): Secondary | ICD-10-CM

## 2020-05-13 DIAGNOSIS — K7460 Unspecified cirrhosis of liver: Secondary | ICD-10-CM | POA: Diagnosis not present

## 2020-05-13 DIAGNOSIS — D509 Iron deficiency anemia, unspecified: Secondary | ICD-10-CM

## 2020-05-13 LAB — CBC
HCT: 27.4 % — ABNORMAL LOW (ref 38.5–50.0)
Hemoglobin: 8.8 g/dL — ABNORMAL LOW (ref 13.2–17.1)
MCH: 29.3 pg (ref 27.0–33.0)
MCHC: 32.1 g/dL (ref 32.0–36.0)
MCV: 91.3 fL (ref 80.0–100.0)
MPV: 11.1 fL (ref 7.5–12.5)
Platelets: 160 10*3/uL (ref 140–400)
RBC: 3 10*6/uL — ABNORMAL LOW (ref 4.20–5.80)
RDW: 13.3 % (ref 11.0–15.0)
WBC: 5.5 10*3/uL (ref 3.8–10.8)

## 2020-05-13 NOTE — Telephone Encounter (Signed)
On February 21 a pharmacist from [facility] in [place] called about the patient's prescription for Aldactone. When she ran the prescription she received a warning as the patient is on a ace inhibitor (lisinopril). Patient will need to monitored for his serum Potassium and Renal Functions. She wanted to make sure that Dr.Rehman was aware of this before it was filled.  Reviewed with Dr.Rehman on 05/13/2020. He states that he is aware and he will monitor the patient. Called the [facility] and spoke with a pharmacist to make him aware.

## 2020-05-14 ENCOUNTER — Telehealth: Payer: Self-pay | Admitting: *Deleted

## 2020-05-14 ENCOUNTER — Encounter (INDEPENDENT_AMBULATORY_CARE_PROVIDER_SITE_OTHER): Payer: Self-pay

## 2020-05-14 ENCOUNTER — Other Ambulatory Visit (INDEPENDENT_AMBULATORY_CARE_PROVIDER_SITE_OTHER): Payer: Self-pay

## 2020-05-14 ENCOUNTER — Telehealth (INDEPENDENT_AMBULATORY_CARE_PROVIDER_SITE_OTHER): Payer: Self-pay

## 2020-05-14 MED ORDER — NA SULFATE-K SULFATE-MG SULF 17.5-3.13-1.6 GM/177ML PO SOLN: 354.0000 mL | Freq: Once | ORAL | 0 refills | Status: AC

## 2020-05-14 NOTE — Telephone Encounter (Signed)
Left a message for the patient to call back and speak to the on-call preop APP of the day 

## 2020-05-14 NOTE — Telephone Encounter (Signed)
Patient with diagnosis of abnormal EKG (?) on Eliquis for anticoagulation.    Procedure: colonoscopy Date of procedure: 05/27/20   CHA2DS2-VASc Score = 4  This indicates a 4.8% annual risk of stroke. The patient's score is based upon: CHF History: No HTN History: Yes Diabetes History: Yes Stroke History: No Vascular Disease History: No Age Score: 2 Gender Score: 0    CrCl 88.1 Platelet count 160  Per office protocol, patient can hold Eliquis for 2 days prior to procedure.    Patient will not need bridging with Lovenox (enoxaparin) around procedure.

## 2020-05-14 NOTE — Telephone Encounter (Signed)
I am aware of this interaction. Patient is is on low-dose spironolactone.  He is already on low-dose furosemide He is due for metabolic 7 next week

## 2020-05-14 NOTE — Telephone Encounter (Signed)
Clinical pharmacist to review how long to hold Eliquis.  Unclear reason to start on Eliquis.  Based on the previous note, Eliquis was started by the patient's PCP due to abnormal EKG.  However recent EKG does not show any sign of atrial fibrillation or atrial flutter.  She is undergoing heart monitor for further work-up.

## 2020-05-14 NOTE — Telephone Encounter (Signed)
Tyler Farmer, CMA  

## 2020-05-14 NOTE — Telephone Encounter (Signed)
   [facility] Pre-operative Risk Assessment    HEARTCARE STAFF: - Please ensure there is not already an duplicate clearance open for this procedure. - Under Visit Info/Reason for Call, type in Other and utilize the format Clearance MM/DD/YY or Clearance TBD. Do not use dashes or single digits. - If request is for dental extraction, please clarify the # of teeth to be extracted.  Request for surgical clearance:  1. What type of surgery is being performed? COLONOSCOPY   2. When is this surgery scheduled? 05/27/20   3. What type of clearance is required (medical clearance vs. Pharmacy clearance to hold med vs. Both)? BOTH  4. Are there any medications that need to be held prior to surgery and how long? ELIQUIS x 2 DAYS PRIOR TO PROCEDURE PER CLEARANCE REQUEST   5. Practice name and name of physician performing surgery? [facility]; DR. Bernadene Person ReHMAN   6. What is the office phone number? [phone]   7.   What is the office fax number? [phone]  8.   Anesthesia type (None, local, MAC, general) ? MAC   Julaine Hua 05/14/2020, 9:18 AM  _________________________________________________________________   (provider comments below)

## 2020-05-15 NOTE — Telephone Encounter (Signed)
Left a message for the patient to call back and speak to the on-call preop APP of the day

## 2020-05-18 ENCOUNTER — Other Ambulatory Visit (INDEPENDENT_AMBULATORY_CARE_PROVIDER_SITE_OTHER): Payer: Self-pay | Admitting: *Deleted

## 2020-05-18 DIAGNOSIS — K7460 Unspecified cirrhosis of liver: Secondary | ICD-10-CM

## 2020-05-18 DIAGNOSIS — R1031 Right lower quadrant pain: Secondary | ICD-10-CM

## 2020-05-18 DIAGNOSIS — D509 Iron deficiency anemia, unspecified: Secondary | ICD-10-CM

## 2020-05-18 DIAGNOSIS — K7581 Nonalcoholic steatohepatitis (NASH): Secondary | ICD-10-CM

## 2020-05-19 ENCOUNTER — Other Ambulatory Visit: Payer: Self-pay | Admitting: *Deleted

## 2020-05-19 NOTE — Telephone Encounter (Signed)
Patient returned call. His PCP told him we were having trouble reaching him. He only has home# no cell#. He said to leave a message on home# if we need to speak with him.

## 2020-05-19 NOTE — Telephone Encounter (Signed)
Notes faxed to surgeon. This phone note will be removed from the preop pool. Richardson Dopp, PA-C  05/19/2020 4:37 PM

## 2020-05-19 NOTE — Telephone Encounter (Signed)
   Primary Cardiologist: Carlyle Dolly, MD  Chart reviewed as part of pre-operative protocol coverage.   80 y.o. male with - (HFpEF) heart failure with preserved ejection fraction  - Abnormal EKG; pt started on Apixaban by PCP - PVCs - Dyspnea - Ex-smoker - DM2 - HTN - HLD - GERD - Cirrhosis - Prostate CA - 05/05/2020: Creat 0.96    Echo 04/14/2020: EF 60-65, RVSP 40.5, no sig valve disease  Last OV 05/01/20 w Katina Dung, NP.    RCRI:  Perioperative Risk of Major Cardiac Event is (%): 0.9 (low risk) DASI:  Functional Capacity in METs is: 4.31 (functional status is fair )  Patient was contacted 05/19/2020 in reference to pre-operative risk assessment for pending surgery as outlined below.    Since last seen, Kolbie Lepkowski Washer has had improved shortness of breath and his edema has almost resolved.  He has fairly chronic non-cardiac chest pain without change.  Recommendations: . Therefore, based on ACC/AHA guidelines, the patient is at acceptable risk for the planned procedure without further cardiovascular testing.  Marland Kitchen He can hold his Apixaban (Eliquis) for 2 days prior to his procedure and resume it afterward as soon as it is felt to be safe.    Please call with questions. Richardson Dopp, PA-C 05/19/2020, 4:34 PM

## 2020-05-19 NOTE — Telephone Encounter (Signed)
   Chart reviewed per preop protocol.  80 y.o. male with  - (HFpEF) heart failure with preserved ejection fraction  - Abnormal EKG; pt started on Apixaban by PCP - PVCs - Dyspnea - Ex-smoker - DM2 - HTN - HLD - GERD - Cirrhosis - Prostate CA - 05/05/2020: Creat 0.96    Echo 04/14/2020: EF 60-65, RVSP 40.5, no sig valve disease  Last OV 05/01/20 w Katina Dung, NP.    2 messages left for pt to call back.  Called today - call answered and then phone hung up.  Will wait for pt to call us back.  Will notify GI we cannot reach pt. Richardson Dopp, PA-C    05/19/2020 8:40 AM

## 2020-05-19 NOTE — Telephone Encounter (Signed)
I called and left message for Dr. Olevia Perches office; see previous notes where pre op provider has tried to reach the pt. Pt does have appt 06/12/20 with Levell July, PAC. I have added pre op clearance needed to appt notes.  Will send FYI to requesting office.

## 2020-05-19 NOTE — Telephone Encounter (Signed)
Left message again for pt to call us back. If pt calls back we need to get a good # to call so that we can reach him. Richardson Dopp, PA-C    05/19/2020 11:44 AM

## 2020-05-20 ENCOUNTER — Other Ambulatory Visit: Payer: Self-pay | Admitting: *Deleted

## 2020-05-20 MED ORDER — APIXABAN 5 MG PO TABS: 5.0000 mg | ORAL_TABLET | Freq: Two times a day (BID) | ORAL | 3 refills | Status: DC

## 2020-05-22 NOTE — Patient Instructions (Signed)
[place]  05/22/2020     @PREFPERIOPPHARMACY @   Your procedure is scheduled on  05/27/2020.   Report to [facility] at  0930  A.M.   Call this number if you have problems the morning of surgery:  [phone]     Remember:  Follow the diet and prep instructions given to you by the office.                        Take these medicines the morning of surgery with A SIP OF WATER  Levothyroxine.  DO NOT take any medications for diabetes the morning of your procedure.  If your glucose is 70 or below the morning of your procedure, drink 1/2 cup of clear juice and recheck your glucose in 15 minutes. If your glucose is still 70 or below, call [phone] for instructions.  If your glucose is 300 or above the morning of your procedure, call [phone] for instructions.     Please brush your teeth.  Do not wear jewelry, make-up or nail polish.  Do not wear lotions, powders, or perfumes, or deodorant.  Do not shave 48 hours prior to surgery.  Men may shave face and neck.  Do not bring valuables to the hospital.  [facility] is not responsible for any belongings or valuables.  Contacts, dentures or bridgework may not be worn into surgery.  Leave your suitcase in the car.  After surgery it may be brought to your room.  For patients admitted to the hospital, discharge time will be determined by your treatment team.  Patients discharged the day of surgery will not be allowed to drive home and  must have someone with them for 24 hours.     Special instructions:  DO NOT smoke tobacco or vape the morning of your procedure.   Please read over the following fact sheets that you were given. Anesthesia Post-op Instructions and Care and Recovery After Surgery       Upper Endoscopy, Adult, Care After This sheet gives you information about how to care for yourself after your procedure. Your health care provider may also give you more specific instructions. If you have  problems or questions, contact your health care provider. What can I expect after the procedure? After the procedure, it is common to have:  A sore throat.  Mild stomach pain or discomfort.  Bloating.  Nausea. Follow these instructions at home:  Follow instructions from your health care provider about what to eat or drink after your procedure.  Return to your normal activities as told by your health care provider. Ask your health care provider what activities are safe for you.  Take over-the-counter and prescription medicines only as told by your health care provider.  If you were given a sedative during the procedure, it can affect you for several hours. Do not drive or operate machinery until your health care provider says that it is safe.  Keep all follow-up visits as told by your health care provider. This is important.   Contact a health care provider if you have:  A sore throat that lasts longer than one day.  Trouble swallowing. Get help right away if:  You vomit blood or your vomit looks like coffee grounds.  You have: ? A fever. ? Bloody, black, or tarry stools. ? A severe sore throat or you cannot swallow. ? Difficulty breathing. ? Severe pain in your chest  or abdomen. Summary  After the procedure, it is common to have a sore throat, mild stomach discomfort, bloating, and nausea.  If you were given a sedative during the procedure, it can affect you for several hours. Do not drive or operate machinery until your health care provider says that it is safe.  Follow instructions from your health care provider about what to eat or drink after your procedure.  Return to your normal activities as told by your health care provider. This information is not intended to replace advice given to you by your health care provider. Make sure you discuss any questions you have with your health care provider. Document Revised: 03/05/2019 Document Reviewed: 08/07/2017 Elsevier  Patient Education  2021 [place].  Colonoscopy, Adult, Care After This sheet gives you information about how to care for yourself after your procedure. Your health care provider may also give you more specific instructions. If you have problems or questions, contact your health care provider. What can I expect after the procedure? After the procedure, it is common to have:  A small amount of blood in your stool for 24 hours after the procedure.  Some gas.  Mild cramping or bloating of your abdomen. Follow these instructions at home: Eating and drinking  Drink enough fluid to keep your urine pale yellow.  Follow instructions from your health care provider about eating or drinking restrictions.  Resume your normal diet as instructed by your health care provider. Avoid heavy or fried foods that are hard to digest.   Activity  Rest as told by your health care provider.  Avoid sitting for a long time without moving. Get up to take short walks every 1-2 hours. This is important to improve blood flow and breathing. Ask for help if you feel weak or unsteady.  Return to your normal activities as told by your health care provider. Ask your health care provider what activities are safe for you. Managing cramping and bloating  Try walking around when you have cramps or feel bloated.  Apply heat to your abdomen as told by your health care provider. Use the heat source that your health care provider recommends, such as a moist heat pack or a heating pad. ? Place a towel between your skin and the heat source. ? Leave the heat on for 20-30 minutes. ? Remove the heat if your skin turns bright red. This is especially important if you are unable to feel pain, heat, or cold. You may have a greater risk of getting burned.   General instructions  If you were given a sedative during the procedure, it can affect you for several hours. Do not drive or operate machinery until your health care  provider says that it is safe.  For the first 24 hours after the procedure: ? Do not sign important documents. ? Do not drink alcohol. ? Do your regular daily activities at a slower pace than normal. ? Eat soft foods that are easy to digest.  Take over-the-counter and prescription medicines only as told by your health care provider.  Keep all follow-up visits as told by your health care provider. This is important. Contact a health care provider if:  You have blood in your stool 2-3 days after the procedure. Get help right away if you have:  More than a small spotting of blood in your stool.  Large blood clots in your stool.  Swelling of your abdomen.  Nausea or vomiting.  A fever.  Increasing pain  in your abdomen that is not relieved with medicine. Summary  After the procedure, it is common to have a small amount of blood in your stool. You may also have mild cramping and bloating of your abdomen.  If you were given a sedative during the procedure, it can affect you for several hours. Do not drive or operate machinery until your health care provider says that it is safe.  Get help right away if you have a lot of blood in your stool, nausea or vomiting, a fever, or increased pain in your abdomen. This information is not intended to replace advice given to you by your health care provider. Make sure you discuss any questions you have with your health care provider. Document Revised: 03/01/2019 Document Reviewed: 10/01/2018 Elsevier Patient Education  2021 [place] After This sheet gives you information about how to care for yourself after your procedure. Your health care provider may also give you more specific instructions. If you have problems or questions, contact your health care provider. What can I expect after the procedure? After the procedure, it is common to have:  Tiredness.  Forgetfulness about what happened after the  procedure.  Impaired judgment for important decisions.  Nausea or vomiting.  Some difficulty with balance. Follow these instructions at home: For the time period you were told by your health care provider:  Rest as needed.  Do not participate in activities where you could fall or become injured.  Do not drive or use machinery.  Do not drink alcohol.  Do not take sleeping pills or medicines that cause drowsiness.  Do not make important decisions or sign legal documents.  Do not take care of children on your own.      Eating and drinking  Follow the diet that is recommended by your health care provider.  Drink enough fluid to keep your urine pale yellow.  If you vomit: ? Drink water, juice, or soup when you can drink without vomiting. ? Make sure you have little or no nausea before eating solid foods. General instructions  Have a responsible adult stay with you for the time you are told. It is important to have someone help care for you until you are awake and alert.  Take over-the-counter and prescription medicines only as told by your health care provider.  If you have sleep apnea, surgery and certain medicines can increase your risk for breathing problems. Follow instructions from your health care provider about wearing your sleep device: ? Anytime you are sleeping, including during daytime naps. ? While taking prescription pain medicines, sleeping medicines, or medicines that make you drowsy.  Avoid smoking.  Keep all follow-up visits as told by your health care provider. This is important. Contact a health care provider if:  You keep feeling nauseous or you keep vomiting.  You feel light-headed.  You are still sleepy or having trouble with balance after 24 hours.  You develop a rash.  You have a fever.  You have redness or swelling around the IV site. Get help right away if:  You have trouble breathing.  You have new-onset confusion at  home. Summary  For several hours after your procedure, you may feel tired. You may also be forgetful and have poor judgment.  Have a responsible adult stay with you for the time you are told. It is important to have someone help care for you until you are awake and alert.  Rest as told. Do not  drive or operate machinery. Do not drink alcohol or take sleeping pills.  Get help right away if you have trouble breathing, or if you suddenly become confused. This information is not intended to replace advice given to you by your health care provider. Make sure you discuss any questions you have with your health care provider. Document Revised: 11/21/2019 Document Reviewed: 02/07/2019 Elsevier Patient Education  2021 [employer].

## 2020-05-25 ENCOUNTER — Other Ambulatory Visit: Payer: Self-pay

## 2020-05-25 ENCOUNTER — Encounter (HOSPITAL_COMMUNITY)
Admission: RE | Admit: 2020-05-25 | Discharge: 2020-05-25 | Disposition: A | Discharge status: Home / Self Care | Payer: Medicare Other | Source: Ambulatory Visit | Attending: Internal Medicine | Admitting: Internal Medicine

## 2020-05-25 ENCOUNTER — Encounter (HOSPITAL_COMMUNITY): Payer: Self-pay

## 2020-05-25 ENCOUNTER — Encounter (HOSPITAL_COMMUNITY): Payer: Self-pay | Admitting: Anesthesiology

## 2020-05-25 ENCOUNTER — Other Ambulatory Visit (HOSPITAL_COMMUNITY)
Admission: RE | Admit: 2020-05-25 | Discharge: 2020-05-25 | Disposition: A | Discharge status: Home / Self Care | Payer: Medicare Other | Source: Ambulatory Visit | Attending: Internal Medicine | Admitting: Internal Medicine

## 2020-05-25 DIAGNOSIS — Z01812 Encounter for preprocedural laboratory examination: Secondary | ICD-10-CM | POA: Diagnosis not present

## 2020-05-25 DIAGNOSIS — U071 COVID-19: Secondary | ICD-10-CM | POA: Insufficient documentation

## 2020-05-25 DIAGNOSIS — K7581 Nonalcoholic steatohepatitis (NASH): Secondary | ICD-10-CM

## 2020-05-25 DIAGNOSIS — K7460 Unspecified cirrhosis of liver: Secondary | ICD-10-CM

## 2020-05-25 DIAGNOSIS — R1031 Right lower quadrant pain: Secondary | ICD-10-CM

## 2020-05-25 DIAGNOSIS — D509 Iron deficiency anemia, unspecified: Secondary | ICD-10-CM

## 2020-05-25 LAB — CBC WITH DIFFERENTIAL/PLATELET
Abs Immature Granulocytes: 0.01 10*3/uL (ref 0.00–0.07)
Basophils Absolute: 0 10*3/uL (ref 0.0–0.1)
Basophils Relative: 0 %
Eosinophils Absolute: 0.2 10*3/uL (ref 0.0–0.5)
Eosinophils Relative: 5 %
HCT: 27.1 % — ABNORMAL LOW (ref 39.0–52.0)
Hemoglobin: 8.4 g/dL — ABNORMAL LOW (ref 13.0–17.0)
Immature Granulocytes: 0 %
Lymphocytes Relative: 12 %
Lymphs Abs: 0.6 10*3/uL — ABNORMAL LOW (ref 0.7–4.0)
MCH: 29.7 pg (ref 26.0–34.0)
MCHC: 31 g/dL (ref 30.0–36.0)
MCV: 95.8 fL (ref 80.0–100.0)
Monocytes Absolute: 0.5 10*3/uL (ref 0.1–1.0)
Monocytes Relative: 10 %
Neutro Abs: 3.4 10*3/uL (ref 1.7–7.7)
Neutrophils Relative %: 73 %
Platelets: 152 10*3/uL (ref 150–400)
RBC: 2.83 MIL/uL — ABNORMAL LOW (ref 4.22–5.81)
RDW: 14.2 % (ref 11.5–15.5)
WBC: 4.7 10*3/uL (ref 4.0–10.5)
nRBC: 0 % (ref 0.0–0.2)

## 2020-05-25 LAB — COMPREHENSIVE METABOLIC PANEL
ALT: 15 U/L (ref 0–44)
AST: 26 U/L (ref 15–41)
Albumin: 3.4 g/dL — ABNORMAL LOW (ref 3.5–5.0)
Alkaline Phosphatase: 81 U/L (ref 38–126)
Anion gap: 10 (ref 5–15)
BUN: 21 mg/dL (ref 8–23)
CO2: 24 mmol/L (ref 22–32)
Calcium: 8.5 mg/dL — ABNORMAL LOW (ref 8.9–10.3)
Chloride: 100 mmol/L (ref 98–111)
Creatinine, Ser: 1.05 mg/dL (ref 0.61–1.24)
GFR, Estimated: >60 mL/min (ref >60)
Glucose, Bld: 96 mg/dL (ref 70–99)
Potassium: 4 mmol/L (ref 3.5–5.1)
Sodium: 134 mmol/L — ABNORMAL LOW (ref 135–145)
Total Bilirubin: 1.4 mg/dL — ABNORMAL HIGH (ref 0.3–1.2)
Total Protein: 8 g/dL (ref 6.5–8.1)

## 2020-05-25 LAB — PROTIME-INR
INR: 1.6 — ABNORMAL HIGH (ref 0.8–1.2)
Prothrombin Time: 18.2 seconds — ABNORMAL HIGH (ref 11.4–15.2)

## 2020-05-26 ENCOUNTER — Telehealth: Payer: Self-pay | Admitting: Family Medicine

## 2020-05-26 LAB — SARS CORONAVIRUS 2 (TAT 6-24 HRS): SARS Coronavirus 2: POSITIVE — AB

## 2020-05-26 NOTE — Telephone Encounter (Signed)
Spoke to patients wife. Per Annabell Sabal, Practice Manager, patients wife can come get Eliquis samples for patient. She can call us from the parking lot and someone will take them out to her.   Eliquis Samples (#28) Lot #:[id number] Exp: 06/2022

## 2020-05-26 NOTE — Telephone Encounter (Signed)
Returned call to patient with no answer.

## 2020-05-26 NOTE — Telephone Encounter (Signed)
Patient calling the office for samples of medication:   1.  What medication and dosage are you requesting samples for?  ELIQUIS   2.  Are you currently out of this medication?   Has 2 pills

## 2020-05-27 ENCOUNTER — Ambulatory Visit (HOSPITAL_COMMUNITY): Admission: RE | Admit: 2020-05-27 | Payer: Medicare Other | Source: Home / Self Care | Admitting: Internal Medicine

## 2020-05-27 ENCOUNTER — Telehealth: Payer: Self-pay

## 2020-05-27 ENCOUNTER — Encounter (HOSPITAL_COMMUNITY): Admission: RE | Payer: Self-pay | Source: Home / Self Care

## 2020-05-27 SURGERY — COLONOSCOPY WITH PROPOFOL
Anesthesia: Monitor Anesthesia Care

## 2020-05-27 NOTE — Telephone Encounter (Signed)
Called to discuss with patient about COVID-19 symptoms and the use of one of the available treatments for those with mild to moderate Covid symptoms and at a high risk of hospitalization.  Pt appears to qualify for outpatient treatment due to co-morbid conditions and/or a member of an at-risk group in accordance with the FDA Emergency Use Authorization.    Symptom onset: None Vaccinated: No Booster? No Immunocompromised? No Qualifiers: DM,HTN,Obesity  Pt. Reports no symptoms.   Tyler Farmer

## 2020-06-01 ENCOUNTER — Other Ambulatory Visit (INDEPENDENT_AMBULATORY_CARE_PROVIDER_SITE_OTHER): Payer: Self-pay

## 2020-06-02 ENCOUNTER — Encounter (INDEPENDENT_AMBULATORY_CARE_PROVIDER_SITE_OTHER): Payer: Self-pay

## 2020-06-04 ENCOUNTER — Telehealth: Payer: Self-pay | Admitting: Cardiology

## 2020-06-04 MED ORDER — APIXABAN 5 MG PO TABS: 5.0000 mg | ORAL_TABLET | Freq: Two times a day (BID) | ORAL | 0 refills | Status: DC

## 2020-06-04 NOTE — Telephone Encounter (Signed)
Pt's wife came into office wanting to check and see if the assistance for his Eliquis has came back yet and he's needing more samples since he has to take 2 a day.   [phone]

## 2020-06-04 NOTE — Telephone Encounter (Signed)
LM that samples were avialbale for pick up - BMS application is still under review

## 2020-06-05 DIAGNOSIS — I493 Ventricular premature depolarization: Secondary | ICD-10-CM | POA: Diagnosis not present

## 2020-06-08 ENCOUNTER — Ambulatory Visit: Payer: Medicare Other | Admitting: Family Medicine

## 2020-06-08 ENCOUNTER — Institutional Professional Consult (permissible substitution): Payer: Medicare Other | Admitting: Internal Medicine

## 2020-06-10 DIAGNOSIS — E1165 Type 2 diabetes mellitus with hyperglycemia: Secondary | ICD-10-CM | POA: Diagnosis not present

## 2020-06-10 DIAGNOSIS — E7800 Pure hypercholesterolemia, unspecified: Secondary | ICD-10-CM | POA: Diagnosis not present

## 2020-06-10 DIAGNOSIS — I10 Essential (primary) hypertension: Secondary | ICD-10-CM | POA: Diagnosis not present

## 2020-06-10 DIAGNOSIS — E039 Hypothyroidism, unspecified: Secondary | ICD-10-CM | POA: Diagnosis not present

## 2020-06-10 DIAGNOSIS — E1129 Type 2 diabetes mellitus with other diabetic kidney complication: Secondary | ICD-10-CM | POA: Diagnosis not present

## 2020-06-11 ENCOUNTER — Other Ambulatory Visit: Payer: Self-pay

## 2020-06-11 ENCOUNTER — Encounter (HOSPITAL_COMMUNITY)
Admission: RE | Admit: 2020-06-11 | Discharge: 2020-06-11 | Disposition: A | Discharge status: Home / Self Care | Payer: Medicare Other | Source: Ambulatory Visit | Attending: Internal Medicine | Admitting: Internal Medicine

## 2020-06-11 ENCOUNTER — Encounter (HOSPITAL_COMMUNITY): Payer: Self-pay

## 2020-06-12 ENCOUNTER — Ambulatory Visit: Payer: Medicare Other | Admitting: Family Medicine

## 2020-06-13 NOTE — Progress Notes (Signed)
Cardiology Office Note  Date: 06/15/2020   ID: Tyler Farmer, DOB [birth year], MRN [id number]  PCP:  [name], MD  Cardiologist:  Carlyle Dolly, MD Electrophysiologist:  None   Chief Complaint: Cardiac follow up  History of Present Illness: Tyler Farmer is a 80 y.o. male with a history of shortness or breath, hx of cirrhosis, PVC's, chest pain, presumed arrhythmia, DM, GERD, HLD, HTN. Hx of smoking x 20 years.   Last here for follow for recent echocardiogram EF 60-65 % Mild LVH. mildly elevated PASP, LA severely dilated, Mild MR. He had recent labs at PCP office and thyroid medication was increased. He continued with SOB and signficant activity intolerance. Stated he gave out very easily when active. He has Liver disease with cirrhosis and obvious ascitic abdomen. Had not seen GI in the past.  BP was elevated over the last three visits in epic.   Was scheduled for Colonoscopy with Dr Laural Golden. However he tested positive for Covid-19 during screening labs and procedure was deferred. Recent ZIO monitor found multiple episodes, 6768 episodes, of SVT. Two episodes of high grade AV block were seen. One episode of VT lasting 4 beats. Frequent isolated supraventricular ectopy were noted. At 5.3%. Ventricular bigeminy and trigeminy were present.   He is here for 6-week follow-up status post recent cardiac monitor.  Results noted above.  Patient states during the monitoring period he had no symptoms.  States he has occasional minor fleeting dizziness when going from sitting to standing otherwise no lightheadedness dizziness, presyncopal or syncopal episodes.  He is pending colonoscopy by Dr. Laural Golden.  He was previously cleared by Richardson Dopp, PA to undergo colonoscopy.  He is already aware he needs to stop his anticoagulant 2 days before the study.  He denies any anginal or exertional symptoms, palpitations or arrhythmias, orthostatic symptoms, CVA or TIA-like symptoms, PND, orthopnea,  bleeding.  Denies any claudication-like symptoms, DVT or PE-like symptoms.  He states he has lost a fair amount of weight since he had COVID approximately 2 weeks ago.  He states he used to weigh 210 pounds and now he weighs 185 on our scales.   Past Medical History:  Diagnosis Date  . Arthritis    Hands/Fingers  . Back pain   . Cirrhosis of liver ([place])   . Diabetes mellitus ([place])   . Enlarged prostate   . Gastric ulcer   . GERD (gastroesophageal reflux disease)   . H/O bladder problems   . Hypercholesteremia   . Hypertension   . Kidney stone   . Prostate cancer ([place])   . Thyroid disease   . UGI bleed 10/26/2016  . Vitamin B12 deficiency 10/26/2016    Past Surgical History:  Procedure Laterality Date  . CIRCUMCISION     at age 23  . COLONOSCOPY N/A 11/13/2015   Procedure: COLONOSCOPY;  Surgeon: Rogene Houston, MD;  Location[facility];  Service: Endoscopy;  Laterality: N/A;  2:10  . ESOPHAGOGASTRODUODENOSCOPY N/A 06/03/2014   Procedure: ESOPHAGOGASTRODUODENOSCOPY (EGD);  Surgeon: Rogene Houston, MD;  Location: [facility];  Service: Endoscopy;  Laterality: N/A;  730  . ESOPHAGOGASTRODUODENOSCOPY N/A 10/26/2016   Procedure: ESOPHAGOGASTRODUODENOSCOPY (EGD);  Surgeon: Rogene Houston, MD;  Location: [facility];  Service: Endoscopy;  Laterality: N/A;  . Gastric Ulcer  1993  . GIVENS CAPSULE STUDY N/A 10/28/2016   Procedure: GIVENS CAPSULE STUDY;  Surgeon: Rogene Houston, MD;  [facility]: [facility];  Service: Endoscopy;  Laterality: N/A;  . HERNIA REPAIR    . POLYPECTOMY  11/13/2015   Procedure: POLYPECTOMY;  Surgeon: Rogene Houston, MD;  Location: [facility];  Service: Endoscopy;;  colon  . Right Elbow Right    A pin was put in    Current Outpatient Medications  Medication Sig Dispense Refill  . acetaminophen (TYLENOL) 500 MG tablet Take 500 mg by mouth every 6 (six) hours as needed.    . Cranberry 500 MG TABS Take 500 mg by mouth daily.    . ferrous sulfate 325  (65 FE) MG tablet Take 325 mg by mouth daily with breakfast.    . furosemide (LASIX) 20 MG tablet Take 1 tablet (20 mg total) by mouth daily. 90 tablet 1  . glipiZIDE (GLUCOTROL) 10 MG tablet Take 10 mg by mouth 2 (two) times daily before a meal.    . levothyroxine (SYNTHROID) 150 MCG tablet Take 150 mcg by mouth daily before breakfast.    . magnesium oxide (MAG-OX) 400 MG tablet Take 400 mg by mouth daily.    . metFORMIN (GLUCOPHAGE) 850 MG tablet Take 850 mg by mouth 3 (three) times daily with meals.    . simvastatin (ZOCOR) 40 MG tablet Take 40 mg by mouth at bedtime.     Marland Kitchen spironolactone (ALDACTONE) 50 MG tablet Take 1 tablet (50 mg total) by mouth daily. 30 tablet 5  . apixaban (ELIQUIS) 5 MG TABS tablet Take 1 tablet (5 mg total) by mouth 2 (two) times daily. (Patient not taking: Reported on 06/15/2020) 28 tablet 0  . lisinopril (ZESTRIL) 40 MG tablet Take 1 tablet (40 mg total) by mouth daily. (Patient not taking: Reported on 06/15/2020) 30 tablet 6   No current facility-administered medications for this visit.   Allergies:  Penicillins   Social History: The patient  reports that he quit smoking about 31 years ago. He has never used smokeless tobacco. He reports that he does not drink alcohol and does not use drugs.   Family History: The patient's family history includes Cerebrovascular Accident in his father; Diabetes in his brother and sister; Emphysema in his mother; Heart disease in his brother, brother, father, and sister.   ROS:  Please see the history of present illness. Otherwise, complete review of systems is positive for none.  All other systems are reviewed and negative.   Physical Exam: VS:  BP 120/60   Pulse 96   Ht 5' 9"  (1.753 m)   Wt 185 lb 3.2 oz (84 kg)   BMI 27.35 kg/m , BMI Body mass index is 27.35 kg/m.  Wt Readings from Last 3 Encounters:  06/15/20 185 lb 3.2 oz (84 kg)  06/11/20 200 lb (90.7 kg)  05/25/20 210 lb (95.3 kg)    General: Patient appears  comfortable at rest. Neck: Supple, no elevated JVP or carotid bruits, no thyromegaly. Lungs: Clear to auscultation, nonlabored breathing at rest. Cardiac: Regular rate and rhythm, no S3 or significant systolic murmur, no pericardial rub. Abdomen: Ascitic distended abdomen, + hepatomegaly, bowel sounds present, no guarding or rebound. Extremities: No pitting edema, distal pulses 2+. Skin: Warm and dry. Musculoskeletal: No kyphosis. Neuropsychiatric: Alert and oriented x3, affect grossly appropriate.  ECG:    Recent Labwork: 05/25/2020: ALT 15; AST 26; BUN 21; Creatinine, Ser 1.05; Hemoglobin 8.4; Platelets 152; Potassium 4.0; Sodium 134  No results found for: CHOL, TRIG, HDL, CHOLHDL, VLDL, LDLCALC, LDLDIRECT  Other Studies Reviewed Today:  Cardiac monitor 05/04/2020 Patient had a min HR  of 21 bpm, max HR of 210 bpm, and avg HR of 105 bpm. Predominant underlying rhythm was Sinus Rhythm. 1 run of Ventricular Tachycardia occurred lasting 4 beats with a max rate of 197 bpm (avg 155 bpm). 6768 Supraventricular Tachycardia runs occurred, the run with the fastest interval lasting 6 beats with a max rate of 210 bpm, the longest lasting 20 beats with an avg rate of 165 bpm. 2 episode(s) of AV Block (High Grade) occurred, lasting a total of 5 secs. Difficulty discerning atrial activity making definitive diagnosis difficult to ascertain. Supraventricular Tachycardia was detected within +/- 45 seconds of symptomatic patient event(s). Isolated SVEs were frequent (5.3%, [id number]), SVE Couplets were occasional (2.3%, 23466), and SVE Triplets were occasional (1.4%, 9517). Isolated VEs were rare (<1.0%, 11028), VE Couplets were rare (<1.0%, 435), and VE Triplets were rare (<1.0%, 2). Ventricular Bigeminy and Trigeminy were present.    Echocardiogram 04/14/2020 1. Left ventricular ejection fraction, by estimation, is 60 to 65%. The left ventricle has normal function. The left ventricle has no regional  wall motion abnormalities. There is mild left ventricular hypertrophy. Left ventricular diastolic parameters are indeterminate. 2. Right ventricular systolic function is normal. The right ventricular size is normal. There is mildly elevated pulmonary artery systolic pressure. 3. Left atrial size was severely dilated. 4. The mitral valve is normal in structure. Mild mitral valve regurgitation. No evidence of mitral stenosis. 5. The aortic valve is tricuspid. Aortic valve regurgitation is not visualized. No aortic stenosis is present. 6. The inferior vena cava is dilated in size with >50% respiratory variability, suggesting right atrial pressure of 8 mmHg.  CT Abdomen, Pelvis 05/08/2020 IMPRESSION: 1. No specific CT findings of the abdomen or pelvis to explain right lower quadrant abdominal pain. 2. Cirrhotic morphology of the liver. 3. The right colon is diffusely thickened, particularly the splenic flexure and transverse colon. This appearance may be related to portal colopathy in the setting of portal hypertension and ascites although other nonspecific infectious or inflammatory colitis would be difficult to exclude. 4. Small volume ascites throughout the abdomen and pelvis. 5. Thickening of the gallbladder wall, which is contracted, and generally nonspecific in the setting of ascites. There are no visible calculi. No biliary ductal dilatation. 6. Unchanged periaortic fat stranding in the retroperitoneum. Numerous prominent retroperitoneal and mesenteric lymph nodes, similar in appearance to prior examination. Findings are in keeping with reported history of retroperitoneal fibrosis. 7. New small right pleural effusion and associated atelectasis or consolidation. 8. Coronary artery disease.  Aortic Atherosclerosis (ICD10-I70.0).  Assessment and Plan:  1. SOB (shortness of breath)   2. PVC's (premature ventricular contractions)   3. Other ascites   4. Primary hypertension      1. SOB (shortness of breath) Currently denies any shortness of breath.  He is status post Covid pneumonia.  States he is feeling better but still a little weak.  States he had COVID around 2 weeks ago.  Recent echocardiogram EF 60-65 %, Mild LVH. mildly elevated PASP, LA severely dilated, Mild MR. At last visit he was referred to pulmonology.  2. PVC's (premature ventricular contractions) Continues with PVC's / skipped heart beats . See last EKG.  Head recent ZIO monitor with frequent episodes of SVT.  Denied any symptoms during the monitoring period.  3. Other ascites History of cirrhosis of the liver with obvious ascites. Recently saw GI . Has a pending colonoscopy.  Advised him he needs to hold Eliquis for 2 days prior to procedure.  He  is already aware.  4. Primary HTN Blood pressure is much better today.  BP 120/60.  Continue lisinopril to 40 mg daily  5. Anemia Recent H&H 8.4 and 27.1 on screening labs by Dr Laural Golden GI.  Has an upcoming colonoscopy with Dr. Laural Golden   Medication Adjustments/Labs and Tests Ordered: Current medicines are reviewed at length with the patient today.  Concerns regarding medicines are outlined above.   Disposition: Follow-up with Dr Harl Bowie or [facility] in 6 months  Signed, Levell July, NP 06/15/2020 8:40 AM    [facility] [place], [place], [place] [address] Phone: [phone]; Fax: [phone]

## 2020-06-15 ENCOUNTER — Ambulatory Visit (INDEPENDENT_AMBULATORY_CARE_PROVIDER_SITE_OTHER): Payer: Medicare Other | Admitting: Family Medicine

## 2020-06-15 ENCOUNTER — Encounter: Payer: Self-pay | Admitting: Family Medicine

## 2020-06-15 ENCOUNTER — Other Ambulatory Visit: Payer: Self-pay

## 2020-06-15 VITALS — BP 120/60 | HR 96 | Ht 69.0 in | Wt 185.2 lb

## 2020-06-15 DIAGNOSIS — R188 Other ascites: Secondary | ICD-10-CM | POA: Diagnosis not present

## 2020-06-15 DIAGNOSIS — I10 Essential (primary) hypertension: Secondary | ICD-10-CM | POA: Diagnosis not present

## 2020-06-15 DIAGNOSIS — I493 Ventricular premature depolarization: Secondary | ICD-10-CM | POA: Diagnosis not present

## 2020-06-15 DIAGNOSIS — R0602 Shortness of breath: Secondary | ICD-10-CM | POA: Diagnosis not present

## 2020-06-15 NOTE — Patient Instructions (Signed)
Medication Instructions:  Continue all current medications.   Labwork: none  Testing/Procedures: none  Follow-Up: 6 months   Any Other Special Instructions Will Be Listed Below (If Applicable).   If you need a refill on your cardiac medications before your next appointment, please call your pharmacy.  

## 2020-06-17 ENCOUNTER — Encounter (INDEPENDENT_AMBULATORY_CARE_PROVIDER_SITE_OTHER): Payer: Self-pay | Admitting: *Deleted

## 2020-06-17 ENCOUNTER — Encounter (HOSPITAL_COMMUNITY)
Admission: RE | Disposition: A | Discharge status: Home / Self Care | Payer: Self-pay | Source: Home / Self Care | Attending: Internal Medicine

## 2020-06-17 ENCOUNTER — Ambulatory Visit (HOSPITAL_COMMUNITY)
Admission: RE | Admit: 2020-06-17 | Discharge: 2020-06-17 | Disposition: A | Discharge status: Home / Self Care | Payer: Medicare Other | Attending: Internal Medicine | Admitting: Internal Medicine

## 2020-06-17 ENCOUNTER — Ambulatory Visit (HOSPITAL_COMMUNITY): Payer: Medicare Other | Admitting: Anesthesiology

## 2020-06-17 ENCOUNTER — Other Ambulatory Visit: Payer: Self-pay

## 2020-06-17 ENCOUNTER — Encounter (HOSPITAL_COMMUNITY): Payer: Self-pay | Admitting: Internal Medicine

## 2020-06-17 DIAGNOSIS — K269 Duodenal ulcer, unspecified as acute or chronic, without hemorrhage or perforation: Secondary | ICD-10-CM | POA: Insufficient documentation

## 2020-06-17 DIAGNOSIS — Z980 Intestinal bypass and anastomosis status: Secondary | ICD-10-CM | POA: Diagnosis not present

## 2020-06-17 DIAGNOSIS — K5730 Diverticulosis of large intestine without perforation or abscess without bleeding: Secondary | ICD-10-CM | POA: Insufficient documentation

## 2020-06-17 DIAGNOSIS — K6389 Other specified diseases of intestine: Secondary | ICD-10-CM | POA: Diagnosis not present

## 2020-06-17 DIAGNOSIS — I8510 Secondary esophageal varices without bleeding: Secondary | ICD-10-CM | POA: Diagnosis not present

## 2020-06-17 DIAGNOSIS — I480 Paroxysmal atrial fibrillation: Secondary | ICD-10-CM | POA: Insufficient documentation

## 2020-06-17 DIAGNOSIS — Z8616 Personal history of COVID-19: Secondary | ICD-10-CM | POA: Insufficient documentation

## 2020-06-17 DIAGNOSIS — Z8711 Personal history of peptic ulcer disease: Secondary | ICD-10-CM | POA: Diagnosis not present

## 2020-06-17 DIAGNOSIS — J449 Chronic obstructive pulmonary disease, unspecified: Secondary | ICD-10-CM | POA: Diagnosis not present

## 2020-06-17 DIAGNOSIS — Z87891 Personal history of nicotine dependence: Secondary | ICD-10-CM | POA: Diagnosis not present

## 2020-06-17 DIAGNOSIS — D125 Benign neoplasm of sigmoid colon: Secondary | ICD-10-CM | POA: Diagnosis not present

## 2020-06-17 DIAGNOSIS — R1031 Right lower quadrant pain: Secondary | ICD-10-CM

## 2020-06-17 DIAGNOSIS — D509 Iron deficiency anemia, unspecified: Secondary | ICD-10-CM | POA: Insufficient documentation

## 2020-06-17 DIAGNOSIS — K635 Polyp of colon: Secondary | ICD-10-CM | POA: Diagnosis not present

## 2020-06-17 DIAGNOSIS — Z7901 Long term (current) use of anticoagulants: Secondary | ICD-10-CM | POA: Diagnosis not present

## 2020-06-17 DIAGNOSIS — Z7984 Long term (current) use of oral hypoglycemic drugs: Secondary | ICD-10-CM | POA: Insufficient documentation

## 2020-06-17 DIAGNOSIS — I8500 Esophageal varices without bleeding: Secondary | ICD-10-CM | POA: Diagnosis not present

## 2020-06-17 DIAGNOSIS — Z79899 Other long term (current) drug therapy: Secondary | ICD-10-CM | POA: Diagnosis not present

## 2020-06-17 DIAGNOSIS — K7581 Nonalcoholic steatohepatitis (NASH): Secondary | ICD-10-CM

## 2020-06-17 DIAGNOSIS — Z903 Acquired absence of stomach [part of]: Secondary | ICD-10-CM | POA: Insufficient documentation

## 2020-06-17 DIAGNOSIS — K7460 Unspecified cirrhosis of liver: Secondary | ICD-10-CM | POA: Insufficient documentation

## 2020-06-17 DIAGNOSIS — K2289 Other specified disease of esophagus: Secondary | ICD-10-CM | POA: Diagnosis not present

## 2020-06-17 DIAGNOSIS — K644 Residual hemorrhoidal skin tags: Secondary | ICD-10-CM | POA: Insufficient documentation

## 2020-06-17 DIAGNOSIS — Z8546 Personal history of malignant neoplasm of prostate: Secondary | ICD-10-CM | POA: Diagnosis not present

## 2020-06-17 DIAGNOSIS — K31811 Angiodysplasia of stomach and duodenum with bleeding: Secondary | ICD-10-CM | POA: Diagnosis not present

## 2020-06-17 DIAGNOSIS — K922 Gastrointestinal hemorrhage, unspecified: Secondary | ICD-10-CM | POA: Diagnosis not present

## 2020-06-17 HISTORY — PX: COLONOSCOPY WITH PROPOFOL: SHX5780

## 2020-06-17 HISTORY — PX: ESOPHAGOGASTRODUODENOSCOPY (EGD) WITH PROPOFOL: SHX5813

## 2020-06-17 HISTORY — PX: POLYPECTOMY: SHX5525

## 2020-06-17 LAB — CBC
HCT: 31.7 % — ABNORMAL LOW (ref 39.0–52.0)
Hemoglobin: 9.7 g/dL — ABNORMAL LOW (ref 13.0–17.0)
MCH: 28.6 pg (ref 26.0–34.0)
MCHC: 30.6 g/dL (ref 30.0–36.0)
MCV: 93.5 fL (ref 80.0–100.0)
Platelets: 192 10*3/uL (ref 150–400)
RBC: 3.39 MIL/uL — ABNORMAL LOW (ref 4.22–5.81)
RDW: 14 % (ref 11.5–15.5)
WBC: 6.5 10*3/uL (ref 4.0–10.5)
nRBC: 0 % (ref 0.0–0.2)

## 2020-06-17 LAB — PROTIME-INR
INR: 1.3 — ABNORMAL HIGH (ref 0.8–1.2)
Prothrombin Time: 15.7 seconds — ABNORMAL HIGH (ref 11.4–15.2)

## 2020-06-17 LAB — HM COLONOSCOPY

## 2020-06-17 LAB — GLUCOSE, CAPILLARY: Glucose-Capillary: 145 mg/dL — ABNORMAL HIGH (ref 70–99)

## 2020-06-17 SURGERY — COLONOSCOPY WITH PROPOFOL
Anesthesia: General

## 2020-06-17 MED ORDER — PHENYLEPHRINE 40 MCG/ML (10ML) SYRINGE FOR IV PUSH (FOR BLOOD PRESSURE SUPPORT)
PREFILLED_SYRINGE | INTRAVENOUS | Status: DC | PRN
  Administered 2020-06-17 (×2): 80 ug via INTRAVENOUS

## 2020-06-17 MED ORDER — PROPOFOL 500 MG/50ML IV EMUL
INTRAVENOUS | Status: DC | PRN
  Administered 2020-06-17: 100 ug/kg/min via INTRAVENOUS

## 2020-06-17 MED ORDER — LACTATED RINGERS IV SOLN: INTRAVENOUS | Status: DC

## 2020-06-17 MED ORDER — LIDOCAINE HCL (CARDIAC) PF 100 MG/5ML IV SOSY
PREFILLED_SYRINGE | INTRAVENOUS | Status: DC | PRN
  Administered 2020-06-17: 50 mg via INTRAVENOUS

## 2020-06-17 MED ORDER — PROPOFOL 10 MG/ML IV BOLUS
INTRAVENOUS | Status: DC | PRN
  Administered 2020-06-17: 30 mg via INTRAVENOUS
  Administered 2020-06-17: 80 mg via INTRAVENOUS
  Administered 2020-06-17: 30 mg via INTRAVENOUS
  Administered 2020-06-17: 20 mg via INTRAVENOUS
  Administered 2020-06-17: 40 mg via INTRAVENOUS

## 2020-06-17 MED ORDER — PHENYLEPHRINE 40 MCG/ML (10ML) SYRINGE FOR IV PUSH (FOR BLOOD PRESSURE SUPPORT)
PREFILLED_SYRINGE | INTRAVENOUS | Status: AC
  Filled 2020-06-17: qty 10

## 2020-06-17 MED ORDER — CHLORHEXIDINE GLUCONATE CLOTH 2 % EX PADS: 6.0000 | MEDICATED_PAD | Freq: Once | CUTANEOUS | Status: DC

## 2020-06-17 MED ORDER — STERILE WATER FOR IRRIGATION IR SOLN: Status: DC | PRN

## 2020-06-17 NOTE — Anesthesia Preprocedure Evaluation (Signed)
Anesthesia Evaluation  Patient identified by MRN, date of birth, ID band Patient awake    Reviewed: Allergy & Precautions, NPO status , Patient's Chart, lab work & pertinent test results  History of Anesthesia Complications Negative for: history of anesthetic complications  Airway Mallampati: II  TM Distance: >3 FB Neck ROM: Full    Dental  (+) Dental Advisory Given, Missing   Pulmonary COPD, former smoker,    Pulmonary exam normal breath sounds clear to auscultation       Cardiovascular Exercise Tolerance: Good hypertension, Pt. on medications Normal cardiovascular exam Rhythm:Regular Rate:Normal     Neuro/Psych negative neurological ROS  negative psych ROS   GI/Hepatic PUD, GERD  Medicated and Controlled,(+) Cirrhosis   ascites    , Hepatitis -  Endo/Other  diabetes  Renal/GU Renal disease     Musculoskeletal  (+) Arthritis  (back pain),   Abdominal   Peds  Hematology  (+) anemia ,   Anesthesia Other Findings Prostate cancer   Reproductive/Obstetrics negative OB ROS                            Anesthesia Physical Anesthesia Plan  ASA: III  Anesthesia Plan: General   Post-op Pain Management:    Induction: Intravenous  PONV Risk Score and Plan: Propofol infusion  Airway Management Planned: Nasal Cannula and Natural Airway  Additional Equipment:   Intra-op Plan:   Post-operative Plan:   Informed Consent: I have reviewed the patients History and Physical, chart, labs and discussed the procedure including the risks, benefits and alternatives for the proposed anesthesia with the patient or authorized representative who has indicated his/her understanding and acceptance.     Dental advisory given  Plan Discussed with: Surgeon and CRNA  Anesthesia Plan Comments:         Anesthesia Quick Evaluation

## 2020-06-17 NOTE — Anesthesia Postprocedure Evaluation (Signed)
Anesthesia Post Note  Patient: Tyler Farmer  Procedure(s) Performed: COLONOSCOPY WITH PROPOFOL (N/A ) ESOPHAGOGASTRODUODENOSCOPY (EGD) WITH PROPOFOL (N/A ) POLYPECTOMY  Patient location during evaluation: Phase II Anesthesia Type: General Level of consciousness: awake and alert and oriented Pain management: pain level controlled Vital Signs Assessment: post-procedure vital signs reviewed and stable Respiratory status: spontaneous breathing, nonlabored ventilation and respiratory function stable Cardiovascular status: blood pressure returned to baseline and stable Postop Assessment: no apparent nausea or vomiting Anesthetic complications: no   No complications documented.   Last Vitals:  Vitals:   06/17/20 0743 06/17/20 0854  BP: 134/67 (!) 105/39  Pulse: 99 88  Resp: (!) 25 20  Temp:  36.4 C  SpO2: (!) 22% (!) 18%    Last Pain:  Vitals:   06/17/20 0854  TempSrc: Axillary  PainSc: 0-No pain                 Orlie Dakin

## 2020-06-17 NOTE — H&P (Signed)
Tyler Farmer is an 80 y.o. male.   Chief Complaint: Patient is here for esophagogastroduodenoscopy and colonoscopy. HPI: Patient is 80 year old Caucasian male with history of cirrhosis who was found to have iron deficiency anemia about 2 months ago.  He was seen in the office 6 weeks ago.  Patient is on Eliquis.  No history of rectal bleeding or tarry stools.  His stool has been dark because he has been on iron.  He also has been experiencing intermittent right lower quadrant abdominal pain.  He has remote history of peptic ulcer disease with surgery.  He had been taking NSAID but not anymore. He was scheduled for EGD and colonoscopy few weeks ago but Covid test was positive.  Therefore procedure was postponed. He has history of paroxysmal atrial fibrillation and has been on Eliquis. Patient states he has lost close to 40 pounds.  His abdomen is not distended anymore. He denies shortness of breath or chest pain.  He states he has had cold symptoms and is taking Tylenol and is almost gone. Last Eliquis dose was 3 days ago. His hemoglobin from this morning is 9.7 g and platelet count is 192K. INR is pending.  Past Medical History:  Diagnosis Date  . Arthritis    Hands/Fingers  . Back pain   . Cirrhosis of liver ([place])   . Diabetes mellitus ([place])   . Enlarged prostate   . Gastric ulcer   . GERD (gastroesophageal reflux disease)   . H/O bladder problems   . Hypercholesteremia   . Hypertension   . Kidney stone   . Prostate cancer ([place])   . Thyroid disease   . UGI bleed 10/26/2016  . Vitamin B12 deficiency 10/26/2016    Past Surgical History:  Procedure Laterality Date  . CIRCUMCISION     at age 39  . COLONOSCOPY N/A 11/13/2015   Procedure: COLONOSCOPY;  Surgeon: Rogene Houston, MD;  [facility];  Service: Endoscopy;  Laterality: N/A;  2:10  . ESOPHAGOGASTRODUODENOSCOPY N/A 06/03/2014   Procedure: ESOPHAGOGASTRODUODENOSCOPY (EGD);  Surgeon: Rogene Houston, MD;  [facility]:  [facility];  Service: Endoscopy;  Laterality: N/A;  730  . ESOPHAGOGASTRODUODENOSCOPY N/A 10/26/2016   Procedure: ESOPHAGOGASTRODUODENOSCOPY (EGD);  Surgeon: Rogene Houston, MD;  Location: [facility];  Service: Endoscopy;  Laterality: N/A;  . Gastric Ulcer  1993  . GIVENS CAPSULE STUDY N/A 10/28/2016   Procedure: GIVENS CAPSULE STUDY;  Surgeon: Rogene Houston, MD;  [facility];  Service: Endoscopy;  Laterality: N/A;  . HERNIA REPAIR    . POLYPECTOMY  11/13/2015   Procedure: POLYPECTOMY;  Surgeon: Rogene Houston, MD;  Location[facility];  Service: Endoscopy;;  colon  . Right Elbow Right    A pin was put in    Family History  Problem Relation Age of Onset  . Emphysema Mother   . Cerebrovascular Accident Father   . Heart disease Father   . Diabetes Sister   . Heart disease Brother   . Heart disease Sister   . Heart disease Brother   . Diabetes Brother    Social History:  reports that he quit smoking about 31 years ago. He has never used smokeless tobacco. He reports that he does not drink alcohol and does not use drugs.  Allergies:  Allergies  Allergen Reactions  . Penicillins Nausea And Vomiting    Has patient had a PCN reaction causing immediate rash, facial/tongue/throat swelling, SOB or lightheadedness with hypotension:  Yes Has patient had a PCN reaction causing severe rash involving mucus membranes or skin necrosis: No Has patient had a PCN reaction that required hospitalization: No Has patient had a PCN reaction occurring within the last 10 years: No If all of the above answers are "NO", then may proceed with Cephalosporin use.   Patient states that it also caused pain in his sides.    Medications Prior to Admission  Medication Sig Dispense Refill  . Cranberry 500 MG TABS Take 500 mg by mouth daily.    . ferrous sulfate 325 (65 FE) MG tablet Take 325 mg by mouth daily with breakfast.    . furosemide (LASIX) 20 MG tablet Take 1 tablet (20 mg  total) by mouth daily. 90 tablet 1  . levothyroxine (SYNTHROID) 150 MCG tablet Take 150 mcg by mouth daily before breakfast.    . lisinopril (ZESTRIL) 40 MG tablet Take 1 tablet (40 mg total) by mouth daily. (Patient not taking: Reported on 06/15/2020) 30 tablet 6  . magnesium oxide (MAG-OX) 400 MG tablet Take 400 mg by mouth daily.    . metFORMIN (GLUCOPHAGE) 850 MG tablet Take 850 mg by mouth 3 (three) times daily with meals.    . simvastatin (ZOCOR) 40 MG tablet Take 40 mg by mouth at bedtime.     Marland Kitchen spironolactone (ALDACTONE) 50 MG tablet Take 1 tablet (50 mg total) by mouth daily. 30 tablet 5  . acetaminophen (TYLENOL) 500 MG tablet Take 500 mg by mouth every 6 (six) hours as needed.    Marland Kitchen apixaban (ELIQUIS) 5 MG TABS tablet Take 1 tablet (5 mg total) by mouth 2 (two) times daily. (Patient not taking: No sig reported) 28 tablet 0  . glipiZIDE (GLUCOTROL) 10 MG tablet Take 10 mg by mouth 2 (two) times daily before a meal.      Results for orders placed or performed during the hospital encounter of 06/17/20 (from the past 48 hour(s))  Glucose, capillary     Status: Abnormal   Collection Time: 06/17/20  7:15 AM  Result Value Ref Range   Glucose-Capillary 145 (H) 70 - 99 mg/dL    Comment: Glucose reference range applies only to samples taken after fasting for at least 8 hours.  CBC     Status: Abnormal   Collection Time: 06/17/20  7:36 AM  Result Value Ref Range   WBC 6.5 4.0 - 10.5 K/uL   RBC 3.39 (L) 4.22 - 5.81 MIL/uL   Hemoglobin 9.7 (L) 13.0 - 17.0 g/dL   HCT 31.7 (L) 39.0 - 52.0 %   MCV 93.5 80.0 - 100.0 fL   MCH 28.6 26.0 - 34.0 pg   MCHC 30.6 30.0 - 36.0 g/dL   RDW 14.0 11.5 - 15.5 %   Platelets 192 150 - 400 K/uL   nRBC 0.0 0.0 - 0.2 %    Comment: Performed at [facility], [address], [place], [place] [address]   No results found.  Review of Systems  Blood pressure 134/67, pulse 99, temperature 98.4 F (36.9 C), temperature source Oral, resp. rate (!) 25, SpO2 (!) 22  %. Physical Exam HENT:     Mouth/Throat:     Mouth: Mucous membranes are moist.     Pharynx: Oropharynx is clear.     Comments: He has few remaining teeth and lower jaw. Eyes:     Conjunctiva/sclera: Conjunctivae normal.  Cardiovascular:     Rate and Rhythm: Normal rate and regular rhythm.  Heart sounds: Normal heart sounds. No murmur heard.   Pulmonary:     Effort: Pulmonary effort is normal.     Breath sounds: Normal breath sounds.  Abdominal:     Comments: Abdomen is full.  He has upper midline scar.  On palpation abdomen is soft.  Upper half of the scar is indurated felt to be due to calcification.  Liver edge is palpable below the right costal margin.  Is firm and nontender.  Spleen is not palpable.  Musculoskeletal:        General: No swelling.     Cervical back: Neck supple.  Lymphadenopathy:     Cervical: No cervical adenopathy.  Skin:    General: Skin is warm and dry.  Neurological:     Mental Status: He is alert.      Assessment/Plan  Iron deficiency anemia. History of cirrhosis and remote peptic ulcer disease. Esophagogastroduodenoscopy and colonoscopy.  Hildred Laser, MD 06/17/2020, 8:05 AM

## 2020-06-17 NOTE — Op Note (Signed)
[facility] Patient Name: Tyler Farmer Procedure Date: 06/17/2020 8:32 AM MRN: [id number] Date of Birth: 1941/01/29 Attending MD: Hildred Laser , MD CSN: [id number] Age: 80 Admit Type: Ambulatory Procedure:                Colonoscopy Indications:              Iron deficiency anemia Providers:                Hildred Laser, MD, Lurline Del, RN, Casimer Bilis, Technician Referring MD:             Carlyle Dolly, MD and Consuello Masse, MD Medicines:                Propofol per Anesthesia Complications:            No immediate complications. Estimated Blood Loss:     Estimated blood loss was minimal. Procedure:                Pre-Anesthesia Assessment:                           - Prior to the procedure, a History and Physical                            was performed, and patient medications and                            allergies were reviewed. The patient's tolerance of                            previous anesthesia was also reviewed. The risks                            and benefits of the procedure and the sedation                            options and risks were discussed with the patient.                            All questions were answered, and informed consent                            was obtained. Prior Anticoagulants: The patient                            last took Eliquis (apixaban) 3 days prior to the                            procedure. ASA Grade Assessment: III - A patient                            with severe systemic disease. After reviewing the  risks and benefits, the patient was deemed in                            satisfactory condition to undergo the procedure.                           - Prior to the procedure, a History and Physical                            was performed, and patient medications and                            allergies were reviewed. The patient's tolerance of                             previous anesthesia was also reviewed. The risks                            and benefits of the procedure and the sedation                            options and risks were discussed with the patient.                            All questions were answered, and informed consent                            was obtained. After reviewing the risks and                            benefits, the patient was deemed in satisfactory                            condition to undergo the procedure.                           After obtaining informed consent, the colonoscope                            was passed under direct vision. Throughout the                            procedure, the patient's blood pressure, pulse, and                            oxygen saturations were monitored continuously. The                            [id number]-HQ190L ([id number]) scope was introduced through                            the anus and advanced to the the cecum, identified  by appendiceal orifice and ileocecal valve. The                            colonoscopy was performed without difficulty. The                            patient tolerated the procedure well. The quality                            of the bowel preparation was good. The ileocecal                            valve, appendiceal orifice, and rectum were                            photographed. Scope In: 8:35:21 AM Scope Out: 8:47:47 AM Scope Withdrawal Time: 0 hours 6 minutes 34 seconds  Total Procedure Duration: 0 hours 12 minutes 26 seconds  Findings:      The perianal and digital rectal examinations were normal.      An area of mildly congested mucosa was found at the hepatic flexure, in       the ascending colon and in the cecum.      Small-mouthed diverticula were found in the sigmoid colon.      A 3 mm polyp was found in the sigmoid colon. The polyp was sessile. The       polyp was removed with a cold snare. Resection  was complete, but the       polyp tissue was not retrieved.      External hemorrhoids were found during retroflexion. The hemorrhoids       were small. Impression:               - Congested mucosa at the hepatic flexure, in the                            ascending colon and in the cecum.                           - Diverticulosis in the sigmoid colon.                           - One 3 mm polyp in the sigmoid colon, removed with                            a cold snare. Complete resection. Polyp tissue not                            retrieved.                           - External hemorrhoids. Moderate Sedation:      Per Anesthesia Care Recommendation:           - Patient has a contact number available for  emergencies. The signs and symptoms of potential                            delayed complications were discussed with the                            patient. Return to normal activities tomorrow.                            Written discharge instructions were provided to the                            patient.                           - Resume previous diet today.                           - Continue present medications.                           - No repeat colonoscopy due to age. Procedure Code(s):        --- Professional ---                           [phone], Colonoscopy, flexible; with removal of                            tumor(s), polyp(s), or other lesion(s) by snare                            technique Diagnosis Code(s):        --- Professional ---                           K63.89, Other specified diseases of intestine                           K64.4, Residual hemorrhoidal skin tags                           K63.5, Polyp of colon                           D50.9, Iron deficiency anemia, unspecified                           K57.30, Diverticulosis of large intestine without                            perforation or abscess without bleeding CPT copyright  2019 American Medical Association. All rights reserved. The codes documented in this report are preliminary and upon coder review may  be revised to meet current compliance requirements. Hildred Laser, MD Hildred Laser, MD 06/17/2020 9:12:08 AM This report has been signed electronically. Number of Addenda: 0

## 2020-06-17 NOTE — Transfer of Care (Signed)
Immediate Anesthesia Transfer of Care Note  Patient: Tyler Farmer  Procedure(s) Performed: COLONOSCOPY WITH PROPOFOL (N/A ) ESOPHAGOGASTRODUODENOSCOPY (EGD) WITH PROPOFOL (N/A ) POLYPECTOMY  Patient Location: Short Stay  Anesthesia Type:General  Level of Consciousness: awake, alert  and oriented  Airway & Oxygen Therapy: Patient Spontanous Breathing  Post-op Assessment: Report given to RN, Post -op Vital signs reviewed and stable and Patient moving all extremities X 4  Post vital signs: Reviewed and stable  Last Vitals:  Vitals Value Taken Time  BP    Temp    Pulse    Resp    SpO2      Last Pain:  Vitals:   06/17/20 0732  TempSrc: Oral  PainSc: 0-No pain         Complications: No complications documented.

## 2020-06-17 NOTE — Anesthesia Procedure Notes (Signed)
Date/Time: 06/17/2020 8:20 AM Performed by: Orlie Dakin, CRNA Pre-anesthesia Checklist: Patient identified, Emergency Drugs available, Suction available and Patient being monitored Patient Re-evaluated:Patient Re-evaluated prior to induction Oxygen Delivery Method: Nasal cannula Induction Type: IV induction Placement Confirmation: positive ETCO2

## 2020-06-17 NOTE — Op Note (Signed)
[facility] Patient Name: Tyler Farmer Procedure Date: 06/17/2020 7:52 AM MRN: [id number] Date of Birth: Aug 06, 1940 Attending MD: Hildred Laser , MD CSN: [id number] Age: 80 Admit Type: Outpatient Procedure:                Upper GI endoscopy Indications:              Iron deficiency anemia Providers:                Hildred Laser, MD, Lurline Del, RN, Casimer Bilis, Technician Referring MD:             Arnoldo Lenis MD and Consuello Masse, MD Medicines:                Propofol per Anesthesia Complications:            No immediate complications. Estimated Blood Loss:     Estimated blood loss: none. Procedure:                Pre-Anesthesia Assessment:                           - Prior to the procedure, a History and Physical                            was performed, and patient medications and                            allergies were reviewed. The patient's tolerance of                            previous anesthesia was also reviewed. The risks                            and benefits of the procedure and the sedation                            options and risks were discussed with the patient.                            All questions were answered, and informed consent                            was obtained. Prior Anticoagulants: The patient                            last took Eliquis (apixaban) 3 days prior to the                            procedure. ASA Grade Assessment: III - A patient                            with severe systemic disease. After reviewing the  risks and benefits, the patient was deemed in                            satisfactory condition to undergo the procedure.                           After obtaining informed consent, the endoscope was                            passed under direct vision. Throughout the                            procedure, the patient's blood pressure, pulse, and                             oxygen saturations were monitored continuously. The                            [id number]-[id number] ([id number]) scope was introduced through the                            mouth, and advanced to the jejunum. The upper GI                            endoscopy was accomplished without difficulty. The                            patient tolerated the procedure well. Scope In: 8:16:39 AM Scope Out: 8:30:25 AM Total Procedure Duration: 0 hours 13 minutes 46 seconds  Findings:      The hypopharynx was normal.      The proximal esophagus and mid esophagus were normal.      Grade II varices were found.      Grade II varices were found in the distal esophagus.      The Z-line was irregular and was found 41 cm from the incisors.      Hematin (altered blood/coffee-ground-like material) was found in the       gastric body.      A single angiodysplastic lesion with bleeding was found in the gastric       body and on the lesser curvature of the stomach. Coagulation for       hemostasis using argon plasma was successful.      Moderate gastric antral vascular ectasia was present in the gastric       fundus and in the gastric body.      Evidence of a patent Billroth II gastrojejunostomy was found. This was       traversed. The efferent limb was examined. The afferent limb was       examined.      Diffuse mildly congested mucosa without active bleeding and with no       stigmata of bleeding was found in the jejunum. Impression:               - Normal hypopharynx.                           - Normal proximal  esophagus and mid esophagus.                           - Esophageal varices.                           - Grade II esophageal varices. Not banded.                           - Z-line irregular, 41 cm from the incisors.                           - Hematin (altered blood/coffee-ground-like                            material) in the gastric body.                           - A single bleeding  angiodysplastic lesion in the                            stomach. Treated with argon plasma coagulation                            ([facility]).                           - Gastric antral vascular ectasia.                           - Patent Billroth II gastrojejunostomy was found.                           -Focal erosion involving mucosa of duodenum                            (afferent limb ) and congested (edematous) jejunum.                           - No specimens collected. Moderate Sedation:      Per Anesthesia Care Recommendation:           - Patient has a contact number available for                            emergencies. The signs and symptoms of potential                            delayed complications were discussed with the                            patient. Return to normal activities tomorrow.                            Written discharge instructions were provided to the  patient.                           - Resume previous diet today.                           - Continue present medications.                           - Resume Eliquis (apixaban) at prior dose in 3 days.                           - Repeat upper endoscopy in 3 months.                           - Return to GI clinic in 4 weeks. Procedure Code(s):        --- Professional ---                           [phone], Esophagogastroduodenoscopy, flexible,                            transoral; with control of bleeding, any method Diagnosis Code(s):        --- Professional ---                           I85.00, Esophageal varices without bleeding                           K22.8, Other specified diseases of esophagus                           K92.2, Gastrointestinal hemorrhage, unspecified                           K31.811, Angiodysplasia of stomach and duodenum                            with bleeding                           Z98.0, Intestinal bypass and anastomosis status                            K63.89, Other specified diseases of intestine                           D50.9, Iron deficiency anemia, unspecified CPT copyright 2019 American Medical Association. All rights reserved. The codes documented in this report are preliminary and upon coder review may  be revised to meet current compliance requirements. Hildred Laser, MD Hildred Laser, MD 06/17/2020 9:07:53 AM This report has been signed electronically. Number of Addenda: 0

## 2020-06-17 NOTE — Discharge Instructions (Signed)
Resume Eliquis on 06/20/2020. Resume other medications as before. Resume usual diet. No driving for 24 hours. Office visit in 4 weeks.   Upper Endoscopy, Adult, Care After This sheet gives you information about how to care for yourself after your procedure. Your health care provider may also give you more specific instructions. If you have problems or questions, contact your health care provider. What can I expect after the procedure? After the procedure, it is common to have:  A sore throat.  Mild stomach pain or discomfort.  Bloating.  Nausea. Follow these instructions at home:  Follow instructions from your health care provider about what to eat or drink after your procedure.  Return to your normal activities as told by your health care provider. Ask your health care provider what activities are safe for you.  Take over-the-counter and prescription medicines only as told by your health care provider.  If you were given a sedative during the procedure, it can affect you for several hours. Do not drive or operate machinery until your health care provider says that it is safe.  Keep all follow-up visits as told by your health care provider. This is important.   Contact a health care provider if you have:  A sore throat that lasts longer than one day.  Trouble swallowing. Get help right away if:  You vomit blood or your vomit looks like coffee grounds.  You have: ? A fever. ? Bloody, black, or tarry stools. ? A severe sore throat or you cannot swallow. ? Difficulty breathing. ? Severe pain in your chest or abdomen. Summary  After the procedure, it is common to have a sore throat, mild stomach discomfort, bloating, and nausea.  If you were given a sedative during the procedure, it can affect you for several hours. Do not drive or operate machinery until your health care provider says that it is safe.  Follow instructions from your health care provider about what to eat  or drink after your procedure.  Return to your normal activities as told by your health care provider. This information is not intended to replace advice given to you by your health care provider. Make sure you discuss any questions you have with your health care provider. Document Revised: 03/05/2019 Document Reviewed: 08/07/2017 Elsevier Patient Education  2021 [place].      Colonoscopy, Adult, Care After This sheet gives you information about how to care for yourself after your procedure. Your doctor may also give you more specific instructions. If you have problems or questions, call your doctor. What can I expect after the procedure? After the procedure, it is common to have:  A small amount of blood in your poop (stool) for 24 hours.  Some gas.  Mild cramping or bloating in your belly (abdomen). Follow these instructions at home: Eating and drinking  Drink enough fluid to keep your pee (urine) pale yellow.  Follow instructions from your doctor about what you cannot eat or drink.  Return to your normal diet as told by your doctor. Avoid heavy or fried foods that are hard to digest.   Activity  Rest as told by your doctor.  Do not sit for a long time without moving. Get up to take short walks every 1-2 hours. This is important. Ask for help if you feel weak or unsteady.  Return to your normal activities as told by your doctor. Ask your doctor what activities are safe for you. To help cramping and bloating:  Try  walking around.  Put heat on your belly as told by your doctor. Use the heat source that your doctor recommends, such as a moist heat pack or a heating pad. ? Put a towel between your skin and the heat source. ? Leave the heat on for 20-30 minutes. ? Remove the heat if your skin turns bright red. This is very important if you are unable to feel pain, heat, or cold. You may have a greater risk of getting burned.   General instructions  If you were given a  medicine to help you relax (sedative) during your procedure, it can affect you for many hours. Do not drive or use machinery until your doctor says that it is safe.  For the first 24 hours after the procedure: ? Do not sign important documents. ? Do not drink alcohol. ? Do your daily activities more slowly than normal. ? Eat foods that are soft and easy to digest.  Take over-the-counter or prescription medicines only as told by your doctor.  Keep all follow-up visits as told by your doctor. This is important. Contact a doctor if:  You have blood in your poop 2-3 days after the procedure. Get help right away if:  You have more than a small amount of blood in your poop.  You see large clumps of tissue (blood clots) in your poop.  Your belly is swollen.  You feel like you may vomit (nauseous).  You vomit.  You have a fever.  You have belly pain that gets worse, and medicine does not help your pain. Summary  After the procedure, it is common to have a small amount of blood in your poop. You may also have mild cramping and bloating in your belly.  If you were given a medicine to help you relax (sedative) during your procedure, it can affect you for many hours. Do not drive or use machinery until your doctor says that it is safe.  Get help right away if you have a lot of blood in your poop, feel like you may vomit, have a fever, or have more belly pain. This information is not intended to replace advice given to you by your health care provider. Make sure you discuss any questions you have with your health care provider. Document Revised: 01/11/2019 Document Reviewed: 10/01/2018 Elsevier Patient Education  2021 [place] After This sheet gives you information about how to care for yourself after your procedure. Your health care provider may also give you more specific instructions. If you have problems or questions, contact your health  care provider. What can I expect after the procedure? After the procedure, it is common to have:  Tiredness.  Forgetfulness about what happened after the procedure.  Impaired judgment for important decisions.  Nausea or vomiting.  Some difficulty with balance. Follow these instructions at home: For the time period you were told by your health care provider:  Rest as needed.  Do not participate in activities where you could fall or become injured.  Do not drive or use machinery.  Do not drink alcohol.  Do not take sleeping pills or medicines that cause drowsiness.  Do not make important decisions or sign legal documents.  Do not take care of children on your own.      Eating and drinking  Follow the diet that is recommended by your health care provider.  Drink enough fluid to keep your urine pale yellow.  If  you vomit: ? Drink water, juice, or soup when you can drink without vomiting. ? Make sure you have little or no nausea before eating solid foods. General instructions  Have a responsible adult stay with you for the time you are told. It is important to have someone help care for you until you are awake and alert.  Take over-the-counter and prescription medicines only as told by your health care provider.  If you have sleep apnea, surgery and certain medicines can increase your risk for breathing problems. Follow instructions from your health care provider about wearing your sleep device: ? Anytime you are sleeping, including during daytime naps. ? While taking prescription pain medicines, sleeping medicines, or medicines that make you drowsy.  Avoid smoking.  Keep all follow-up visits as told by your health care provider. This is important. Contact a health care provider if:  You keep feeling nauseous or you keep vomiting.  You feel light-headed.  You are still sleepy or having trouble with balance after 24 hours.  You develop a rash.  You have a  fever.  You have redness or swelling around the IV site. Get help right away if:  You have trouble breathing.  You have new-onset confusion at home. Summary  For several hours after your procedure, you may feel tired. You may also be forgetful and have poor judgment.  Have a responsible adult stay with you for the time you are told. It is important to have someone help care for you until you are awake and alert.  Rest as told. Do not drive or operate machinery. Do not drink alcohol or take sleeping pills.  Get help right away if you have trouble breathing, or if you suddenly become confused. This information is not intended to replace advice given to you by your health care provider. Make sure you discuss any questions you have with your health care provider. Document Revised: 11/21/2019 Document Reviewed: 02/07/2019 Elsevier Patient Education  2021 [employer].

## 2020-06-22 ENCOUNTER — Encounter (HOSPITAL_COMMUNITY): Payer: Self-pay | Admitting: Internal Medicine

## 2020-06-22 DIAGNOSIS — D500 Iron deficiency anemia secondary to blood loss (chronic): Secondary | ICD-10-CM | POA: Diagnosis not present

## 2020-06-22 DIAGNOSIS — I10 Essential (primary) hypertension: Secondary | ICD-10-CM | POA: Diagnosis not present

## 2020-06-22 DIAGNOSIS — E1129 Type 2 diabetes mellitus with other diabetic kidney complication: Secondary | ICD-10-CM | POA: Diagnosis not present

## 2020-06-22 DIAGNOSIS — E1165 Type 2 diabetes mellitus with hyperglycemia: Secondary | ICD-10-CM | POA: Diagnosis not present

## 2020-06-22 DIAGNOSIS — I8500 Esophageal varices without bleeding: Secondary | ICD-10-CM | POA: Diagnosis not present

## 2020-06-22 DIAGNOSIS — Z0001 Encounter for general adult medical examination with abnormal findings: Secondary | ICD-10-CM | POA: Diagnosis not present

## 2020-06-22 DIAGNOSIS — E039 Hypothyroidism, unspecified: Secondary | ICD-10-CM | POA: Diagnosis not present

## 2020-07-14 ENCOUNTER — Encounter (INDEPENDENT_AMBULATORY_CARE_PROVIDER_SITE_OTHER): Payer: Self-pay | Admitting: Internal Medicine

## 2020-07-14 ENCOUNTER — Other Ambulatory Visit: Payer: Self-pay

## 2020-07-14 ENCOUNTER — Ambulatory Visit (INDEPENDENT_AMBULATORY_CARE_PROVIDER_SITE_OTHER): Payer: Medicare Other | Admitting: Internal Medicine

## 2020-07-14 VITALS — BP 134/64 | HR 99 | Temp 98.9°F | Ht 69.0 in | Wt 184.0 lb

## 2020-07-14 DIAGNOSIS — D500 Iron deficiency anemia secondary to blood loss (chronic): Secondary | ICD-10-CM

## 2020-07-14 DIAGNOSIS — K7460 Unspecified cirrhosis of liver: Secondary | ICD-10-CM

## 2020-07-14 DIAGNOSIS — K7581 Nonalcoholic steatohepatitis (NASH): Secondary | ICD-10-CM

## 2020-07-14 DIAGNOSIS — R188 Other ascites: Secondary | ICD-10-CM | POA: Insufficient documentation

## 2020-07-14 LAB — BASIC METABOLIC PANEL
BUN: 20 mg/dL (ref 7–25)
CO2: 28 mmol/L (ref 20–32)
Calcium: 8.9 mg/dL (ref 8.6–10.3)
Chloride: 103 mmol/L (ref 98–110)
Creat: 1.06 mg/dL (ref 0.70–1.18)
Glucose, Bld: 128 mg/dL (ref 65–139)
Potassium: 4.7 mmol/L (ref 3.5–5.3)
Sodium: 139 mmol/L (ref 135–146)

## 2020-07-14 LAB — CBC
HCT: 28.1 % — ABNORMAL LOW (ref 38.5–50.0)
Hemoglobin: 9.1 g/dL — ABNORMAL LOW (ref 13.2–17.1)
MCH: 29 pg (ref 27.0–33.0)
MCHC: 32.4 g/dL (ref 32.0–36.0)
MCV: 89.5 fL (ref 80.0–100.0)
MPV: 10.6 fL (ref 7.5–12.5)
Platelets: 155 10*3/uL (ref 140–400)
RBC: 3.14 10*6/uL — ABNORMAL LOW (ref 4.20–5.80)
RDW: 14.4 % (ref 11.0–15.0)
WBC: 7 10*3/uL (ref 3.8–10.8)

## 2020-07-14 NOTE — Patient Instructions (Signed)
Physician will call with results of blood test when completed. Take dicyclomine 10 mg before breakfast daily or as needed.  Can take second dose before lunch as long as you do not experience drowsiness or constipation. Please check with Dr. Quintin Alto if metformin dose could be reduced as it may be the source of your diarrhea. Please check weight twice a week and call if you are lost more than 5 pounds.

## 2020-07-14 NOTE — Progress Notes (Signed)
Presenting complaint;  Follow-up for cirrhosis and iron deficiency anemia.  Database and subjective:  Patient is 80 year old Caucasian male who has history of cirrhosis secondary to NASH complicated by esophageal varices which have never required banding who was last seen on 05/05/2020 for new onset of ascites and iron deficiency anemia.  His stool was heme positive.  He was begun on furosemide and spironolactone.  Patient had been on Eliquis at that time for paroxysmal atrial fibrillation. He underwent esophagogastroduodenoscopy and colonoscopy on 06/17/2020. He had grade 2 esophageal varices but he was actively bleeding from gastric AV malformation which was ablated with argon plasma coagulator.  He had B2 anastomosis without ulceration and edema to duodenum.  Colonoscopy revealed congested mucosa at hepatic flexure diverticula at sigmoid colon and and he had small polyp removed from sigmoid colon but was lost. Patient was begun on p.o. iron. Patient denies melena or rectal bleeding.  Today's complaining of diarrhea which she has had intermittently for 1 year.  He states it has been worse lately.  He does not have nocturnal bowel movement.  One of his friend suggested trying dicyclomine.  He has a prescription of dicyclomine that was given to him by Dr. Quintin Alto.  He has not yet taken this medication.  Abdominal distention has decreased.  He does not have abdominal pain.  He says his appetite is good.  He says he feels better with he has less energy and stamina and he just does not push himself.  He he has been switched to low-dose aspirin.  He is not taking other OTC NSAIDs. He has lost 26 pounds since his office visit of 05/05/2020. .  Current Medications: Outpatient Encounter Medications as of 07/14/2020  Medication Sig  . acetaminophen (TYLENOL) 500 MG tablet Take 500 mg by mouth every 6 (six) hours as needed.  Marland Kitchen aspirin EC 81 MG tablet Take 81 mg by mouth daily. Swallow whole.  . Cranberry 500 MG  TABS Take 500 mg by mouth daily.  . ferrous sulfate 325 (65 FE) MG tablet Take 325 mg by mouth daily with breakfast.  . furosemide (LASIX) 20 MG tablet Take 1 tablet (20 mg total) by mouth daily.  Marland Kitchen glipiZIDE (GLUCOTROL) 10 MG tablet Take 10 mg by mouth 2 (two) times daily before a meal.  . levothyroxine (SYNTHROID) 150 MCG tablet Take 150 mcg by mouth daily before breakfast.  . lisinopril (ZESTRIL) 40 MG tablet Take 1 tablet (40 mg total) by mouth daily. (Patient taking differently: Take 20 mg by mouth daily.)  . magnesium oxide (MAG-OX) 400 MG tablet Take 400 mg by mouth daily.  . metFORMIN (GLUCOPHAGE) 850 MG tablet Take 850 mg by mouth 3 (three) times daily with meals.  . simvastatin (ZOCOR) 40 MG tablet Take 40 mg by mouth at bedtime.   Marland Kitchen spironolactone (ALDACTONE) 50 MG tablet Take 1 tablet (50 mg total) by mouth daily.  . [DISCONTINUED] apixaban (ELIQUIS) 5 MG TABS tablet Take 1 tablet (5 mg total) by mouth 2 (two) times daily. (Patient not taking: Reported on 07/14/2020)   No facility-administered encounter medications on file as of 07/14/2020.     Objective: Blood pressure 134/64, pulse 99, temperature 98.9 F (37.2 C), temperature source Oral, height 5' 9"  (1.753 m), weight 184 lb (83.5 kg). Patient is alert and in no acute distress. He is wearing a mask. He does not have asterixis. Conjunctiva is pale. Sclera is nonicteric Oropharyngeal mucosa is normal. He only has 2 teeth in lower jaw. No  neck masses or thyromegaly noted. Cardiac exam with regular rhythm normal S1 and S2. No murmur or gallop noted. Lungs are clear to auscultation. Abdomen is full but not distended.  Shifting dullness absent.  He has scar across upper abdomen.  This area is indurated and mildly tender.  Induration involves abdominal wall secondary to calcified scar.  No hepatospleno megaly. No LE edema or clubbing noted.  Labs/studies Results:  CBC Latest Ref Rng & Units 06/17/2020 05/25/2020 05/13/2020  WBC  4.0 - 10.5 K/uL 6.5 4.7 5.5  Hemoglobin 13.0 - 17.0 g/dL 9.7(L) 8.4(L) 8.8(L)  Hematocrit 39.0 - 52.0 % 31.7(L) 27.1(L) 27.4(L)  Platelets 150 - 400 K/uL 192 152 160    CMP Latest Ref Rng & Units 05/25/2020 05/05/2020 10/25/2016  Glucose 70 - 99 mg/dL 96 128 180(H)  BUN 8 - 23 mg/dL 21 18 15   Creatinine 0.61 - 1.24 mg/dL 1.05 0.96 0.86  Sodium 135 - 145 mmol/L 134(L) 139 138  Potassium 3.5 - 5.1 mmol/L 4.0 3.7 4.3  Chloride 98 - 111 mmol/L 100 102 106  CO2 22 - 32 mmol/L 24 28 25   Calcium 8.9 - 10.3 mg/dL 8.5(L) 8.2(L) 8.7(L)  Total Protein 6.5 - 8.1 g/dL 8.0 7.3 -  Total Bilirubin 0.3 - 1.2 mg/dL 1.4(H) 1.1 -  Alkaline Phos 38 - 126 U/L 81 - -  AST 15 - 41 U/L 26 24 -  ALT 0 - 44 U/L 15 15 -    Hepatic Function Latest Ref Rng & Units 05/25/2020 05/05/2020 10/24/2016  Total Protein 6.5 - 8.1 g/dL 8.0 7.3 6.6  Albumin 3.5 - 5.0 g/dL 3.4(L) - 3.4(L)  AST 15 - 41 U/L 26 24 26   ALT 0 - 44 U/L 15 15 17   Alk Phosphatase 38 - 126 U/L 81 - 72  Total Bilirubin 0.3 - 1.2 mg/dL 1.4(H) 1.1 0.8     Assessment:  #1.  Cirrhotic ascites.  Ascites is well controlled with combination of low-dose furosemide and spironolactone.  He will continue this combination as long as renal function is stable.  #2.  Iron deficiency anemia.  Recent evaluation revealed active bleeding from gastric AV malformation which was ablated with APC.  He is not having melena anymore.  He is due for repeat H&H.  #3.  Cirrhosis secondary to NASH.  Patient is not a candidate for transplant evaluation.  His disease has been complicated by ascites and esophageal varices.  Varices will need to be reevaluated in 3 months or so.  It is unclear whether he has received hepatitis A and B vaccination.  He needs to check with Dr. Quintin Alto.  #4.  Chronic/intermittent diarrhea.  I suspect diarrhea is due to metformin.  He can try dicyclomine 10 to 20 mg a day as long as he has no side effects.  If dicyclomine does not work he may have to come off  metformin to be determined by Dr. Quintin Alto.   Plan:  Patient will go to the lab for CBC and metabolic 7. Patient reminded he cannot take Advil Aleve or other OTC NSAIDs. Patient advised to check his weight once or twice a week and call office if weight drops by more than 5 pounds. Office visit in 3 months. We will plan EGD after his next office visit.

## 2020-07-28 ENCOUNTER — Other Ambulatory Visit (INDEPENDENT_AMBULATORY_CARE_PROVIDER_SITE_OTHER): Payer: Self-pay

## 2020-07-28 DIAGNOSIS — D509 Iron deficiency anemia, unspecified: Secondary | ICD-10-CM

## 2020-08-03 DIAGNOSIS — K7460 Unspecified cirrhosis of liver: Secondary | ICD-10-CM | POA: Diagnosis not present

## 2020-08-03 DIAGNOSIS — D509 Iron deficiency anemia, unspecified: Secondary | ICD-10-CM | POA: Diagnosis not present

## 2020-08-03 DIAGNOSIS — R1031 Right lower quadrant pain: Secondary | ICD-10-CM | POA: Diagnosis not present

## 2020-08-03 DIAGNOSIS — K7581 Nonalcoholic steatohepatitis (NASH): Secondary | ICD-10-CM | POA: Diagnosis not present

## 2020-08-04 LAB — BASIC METABOLIC PANEL
BUN/Creatinine Ratio: 28 (calc) — ABNORMAL HIGH (ref 6–22)
BUN: 29 mg/dL — ABNORMAL HIGH (ref 7–25)
CO2: 30 mmol/L (ref 20–32)
Calcium: 9.4 mg/dL (ref 8.6–10.3)
Chloride: 104 mmol/L (ref 98–110)
Creat: 1.02 mg/dL (ref 0.70–1.18)
Glucose, Bld: 163 mg/dL — ABNORMAL HIGH (ref 65–139)
Potassium: 4.6 mmol/L (ref 3.5–5.3)
Sodium: 140 mmol/L (ref 135–146)

## 2020-08-04 LAB — CBC
HCT: 27.8 % — ABNORMAL LOW (ref 38.5–50.0)
Hemoglobin: 9 g/dL — ABNORMAL LOW (ref 13.2–17.1)
MCH: 29.2 pg (ref 27.0–33.0)
MCHC: 32.4 g/dL (ref 32.0–36.0)
MCV: 90.3 fL (ref 80.0–100.0)
MPV: 11.4 fL (ref 7.5–12.5)
Platelets: 120 10*3/uL — ABNORMAL LOW (ref 140–400)
RBC: 3.08 10*6/uL — ABNORMAL LOW (ref 4.20–5.80)
RDW: 15.1 % — ABNORMAL HIGH (ref 11.0–15.0)
WBC: 4.2 10*3/uL (ref 3.8–10.8)

## 2020-08-06 ENCOUNTER — Encounter (INDEPENDENT_AMBULATORY_CARE_PROVIDER_SITE_OTHER): Payer: Self-pay | Admitting: *Deleted

## 2020-08-19 ENCOUNTER — Telehealth: Payer: Self-pay | Admitting: *Deleted

## 2020-08-19 NOTE — Telephone Encounter (Signed)
Pt will be eligible for assistance for Eliquis once $327.32 out of pocket has been spent this year - pt notified of this in March - application sent for scanning

## 2020-10-19 ENCOUNTER — Other Ambulatory Visit: Payer: Self-pay | Admitting: Cardiology

## 2020-10-19 DIAGNOSIS — E1165 Type 2 diabetes mellitus with hyperglycemia: Secondary | ICD-10-CM | POA: Diagnosis not present

## 2020-10-19 DIAGNOSIS — E7800 Pure hypercholesterolemia, unspecified: Secondary | ICD-10-CM | POA: Diagnosis not present

## 2020-10-19 DIAGNOSIS — I10 Essential (primary) hypertension: Secondary | ICD-10-CM | POA: Diagnosis not present

## 2020-10-19 DIAGNOSIS — E11628 Type 2 diabetes mellitus with other skin complications: Secondary | ICD-10-CM | POA: Diagnosis not present

## 2020-10-19 DIAGNOSIS — Z13220 Encounter for screening for lipoid disorders: Secondary | ICD-10-CM | POA: Diagnosis not present

## 2020-10-19 DIAGNOSIS — Z1329 Encounter for screening for other suspected endocrine disorder: Secondary | ICD-10-CM | POA: Diagnosis not present

## 2020-10-19 DIAGNOSIS — E1129 Type 2 diabetes mellitus with other diabetic kidney complication: Secondary | ICD-10-CM | POA: Diagnosis not present

## 2020-10-19 DIAGNOSIS — E039 Hypothyroidism, unspecified: Secondary | ICD-10-CM | POA: Diagnosis not present

## 2020-10-29 DIAGNOSIS — E039 Hypothyroidism, unspecified: Secondary | ICD-10-CM | POA: Diagnosis not present

## 2020-10-29 DIAGNOSIS — D500 Iron deficiency anemia secondary to blood loss (chronic): Secondary | ICD-10-CM | POA: Diagnosis not present

## 2020-10-29 DIAGNOSIS — I129 Hypertensive chronic kidney disease with stage 1 through stage 4 chronic kidney disease, or unspecified chronic kidney disease: Secondary | ICD-10-CM | POA: Diagnosis not present

## 2020-10-29 DIAGNOSIS — E1122 Type 2 diabetes mellitus with diabetic chronic kidney disease: Secondary | ICD-10-CM | POA: Diagnosis not present

## 2020-10-29 DIAGNOSIS — I10 Essential (primary) hypertension: Secondary | ICD-10-CM | POA: Diagnosis not present

## 2020-10-29 DIAGNOSIS — E1129 Type 2 diabetes mellitus with other diabetic kidney complication: Secondary | ICD-10-CM | POA: Diagnosis not present

## 2020-10-29 DIAGNOSIS — E1165 Type 2 diabetes mellitus with hyperglycemia: Secondary | ICD-10-CM | POA: Diagnosis not present

## 2020-11-04 DIAGNOSIS — S62393A Other fracture of third metacarpal bone, left hand, initial encounter for closed fracture: Secondary | ICD-10-CM | POA: Diagnosis not present

## 2020-11-04 DIAGNOSIS — S62395A Other fracture of fourth metacarpal bone, left hand, initial encounter for closed fracture: Secondary | ICD-10-CM | POA: Diagnosis not present

## 2020-11-04 DIAGNOSIS — M79642 Pain in left hand: Secondary | ICD-10-CM | POA: Diagnosis not present

## 2020-11-04 DIAGNOSIS — Z880 Allergy status to penicillin: Secondary | ICD-10-CM | POA: Diagnosis not present

## 2020-11-11 DIAGNOSIS — S62323A Displaced fracture of shaft of third metacarpal bone, left hand, initial encounter for closed fracture: Secondary | ICD-10-CM | POA: Diagnosis not present

## 2020-11-11 DIAGNOSIS — S62325A Displaced fracture of shaft of fourth metacarpal bone, left hand, initial encounter for closed fracture: Secondary | ICD-10-CM | POA: Diagnosis not present

## 2020-11-13 ENCOUNTER — Other Ambulatory Visit: Payer: Self-pay

## 2020-11-13 ENCOUNTER — Other Ambulatory Visit: Payer: Medicare Other

## 2020-11-13 DIAGNOSIS — Z8546 Personal history of malignant neoplasm of prostate: Secondary | ICD-10-CM

## 2020-11-14 LAB — PSA: Prostate Specific Ag, Serum: 0.2 ng/mL (ref 0.0–4.0)

## 2020-11-16 DIAGNOSIS — M79642 Pain in left hand: Secondary | ICD-10-CM | POA: Diagnosis not present

## 2020-11-17 ENCOUNTER — Ambulatory Visit (INDEPENDENT_AMBULATORY_CARE_PROVIDER_SITE_OTHER): Payer: Medicare Other | Admitting: Internal Medicine

## 2020-11-19 ENCOUNTER — Ambulatory Visit (INDEPENDENT_AMBULATORY_CARE_PROVIDER_SITE_OTHER): Payer: Medicare Other | Admitting: Urology

## 2020-11-19 ENCOUNTER — Other Ambulatory Visit: Payer: Self-pay

## 2020-11-19 VITALS — BP 125/62 | HR 97

## 2020-11-19 DIAGNOSIS — N480 Leukoplakia of penis: Secondary | ICD-10-CM | POA: Diagnosis not present

## 2020-11-19 DIAGNOSIS — Z87442 Personal history of urinary calculi: Secondary | ICD-10-CM

## 2020-11-19 DIAGNOSIS — Z8546 Personal history of malignant neoplasm of prostate: Secondary | ICD-10-CM | POA: Diagnosis not present

## 2020-11-19 DIAGNOSIS — N3001 Acute cystitis with hematuria: Secondary | ICD-10-CM

## 2020-11-19 DIAGNOSIS — R351 Nocturia: Secondary | ICD-10-CM | POA: Diagnosis not present

## 2020-11-19 LAB — URINALYSIS, ROUTINE W REFLEX MICROSCOPIC
Bilirubin, UA: NEGATIVE
Glucose, UA: NEGATIVE
Ketones, UA: NEGATIVE
Leukocytes,UA: NEGATIVE
Nitrite, UA: NEGATIVE
Protein,UA: NEGATIVE
Specific Gravity, UA: 1.01 (ref 1.005–1.030)
Urobilinogen, Ur: 0.2 mg/dL (ref 0.2–1.0)
pH, UA: 5.5 (ref 5.0–7.5)

## 2020-11-19 LAB — MICROSCOPIC EXAMINATION
Renal Epithel, UA: NONE SEEN /HPF
WBC, UA: >30 /HPF — AB (ref 0–5)

## 2020-11-19 MED ORDER — NITROFURANTOIN MONOHYD MACRO 100 MG PO CAPS: 100.0000 mg | ORAL_CAPSULE | Freq: Two times a day (BID) | ORAL | 0 refills | Status: DC

## 2020-11-19 NOTE — Progress Notes (Signed)
Subjective:  1. Personal history of malignant neoplasm of prostate   2. Balanitis xerotica obliterans   3. Nocturia   4. History of nephrolithiasis   5. Acute cystitis with hematuria      Tyler Farmer returns today in f/u for his history of prostate cancer treated with radiation therapy in 2005. He had high grade disease and was treated with 2 years of androgen ablation and EXRT. His PSA is 0.2 prior to this visit.  It was 0.4 last year. He is voiding well without complaints. He has nocturia x 1-2.  He is taking a cranberry supplement which helps.  He has no hematuria.  He has a little burning with urination. His UA looks infected today.  He has recurrent phimosis with a history of a prior circumcision for BXO. It is stable and doesn't bother him too much.  He has a history of stones but no suggestive symptoms. He has no other associated signs or symptoms.  He broke some fingers on his left hand go carting a couple of weeks ago.      ROS:  ROS:  A complete review of systems was performed.  All systems are negative except for pertinent findings as noted.   ROS  Allergies  Allergen Reactions   Penicillins Nausea And Vomiting    Has patient had a PCN reaction causing immediate rash, facial/tongue/throat swelling, SOB or lightheadedness with hypotension: Yes Has patient had a PCN reaction causing severe rash involving mucus membranes or skin necrosis: No Has patient had a PCN reaction that required hospitalization: No Has patient had a PCN reaction occurring within the last 10 years: No If all of the above answers are "NO", then may proceed with Cephalosporin use.   Patient states that it also caused pain in his sides.    Outpatient Encounter Medications as of 11/19/2020  Medication Sig Note   acetaminophen (TYLENOL) 500 MG tablet Take 500 mg by mouth every 6 (six) hours as needed.    aspirin EC 81 MG tablet Take 81 mg by mouth daily. Swallow whole.    Cranberry 500 MG TABS Take 500  mg by mouth daily.    dicyclomine (BENTYL) 10 MG capsule Take 1 capsule (10 mg total) by mouth 2 (two) times daily as needed for spasms.    ferrous sulfate 325 (65 FE) MG tablet Take 325 mg by mouth daily with breakfast.    furosemide (LASIX) 20 MG tablet Take 1 tablet by mouth once daily    glipiZIDE (GLUCOTROL) 10 MG tablet Take 10 mg by mouth 2 (two) times daily before a meal.    levothyroxine (SYNTHROID) 150 MCG tablet Take 150 mcg by mouth daily before breakfast.    lisinopril (ZESTRIL) 40 MG tablet Take 1 tablet (40 mg total) by mouth daily. (Patient taking differently: Take 20 mg by mouth daily.) 05/14/2020: 40 MG + 20 MG=60 MG   magnesium oxide (MAG-OX) 400 MG tablet Take 400 mg by mouth daily.    metFORMIN (GLUCOPHAGE) 850 MG tablet Take 850 mg by mouth 3 (three) times daily with meals.    nitrofurantoin, macrocrystal-monohydrate, (MACROBID) 100 MG capsule Take 1 capsule (100 mg total) by mouth every 12 (twelve) hours.    simvastatin (ZOCOR) 40 MG tablet Take 40 mg by mouth at bedtime.     spironolactone (ALDACTONE) 50 MG tablet Take 1 tablet (50 mg total) by mouth daily.    No facility-administered encounter medications on file as of 11/19/2020.    Past Medical History:  Diagnosis Date   Arthritis    Hands/Fingers   Back pain    Cirrhosis of liver (HCC)    Diabetes mellitus ([place])    Enlarged prostate    Gastric ulcer    GERD (gastroesophageal reflux disease)    H/O bladder problems    Hypercholesteremia    Hypertension    Kidney stone    Prostate cancer ([place])    Thyroid disease    UGI bleed 10/26/2016   Vitamin B12 deficiency 10/26/2016    Past Surgical History:  Procedure Laterality Date   CIRCUMCISION     at age 1   COLONOSCOPY N/A 11/13/2015   Procedure: COLONOSCOPY;  Surgeon: Rogene Houston, MD;  [facility];  Service: Endoscopy;  Laterality: N/A;  2:10   COLONOSCOPY WITH PROPOFOL N/A 06/17/2020   Procedure: COLONOSCOPY WITH PROPOFOL;  Surgeon: Rogene Houston, MD;  [facility];  Service: Endoscopy;  Laterality: N/A;  AM / patient had positive covid 05/25/20   ESOPHAGOGASTRODUODENOSCOPY N/A 06/03/2014   Procedure: ESOPHAGOGASTRODUODENOSCOPY (EGD);  Surgeon: Rogene Houston, MD;  [facility];  Service: Endoscopy;  Laterality: N/A;  730   ESOPHAGOGASTRODUODENOSCOPY N/A 10/26/2016   Procedure: ESOPHAGOGASTRODUODENOSCOPY (EGD);  Surgeon: Rogene Houston, MD;  [facility];  Service: Endoscopy;  Laterality: N/A;   ESOPHAGOGASTRODUODENOSCOPY (EGD) WITH PROPOFOL N/A 06/17/2020   Procedure: ESOPHAGOGASTRODUODENOSCOPY (EGD) WITH PROPOFOL;  Surgeon: Rogene Houston, MD;  [facility];  Service: Endoscopy;  Laterality: N/A;   Gastric Ulcer  1993   GIVENS CAPSULE STUDY N/A 10/28/2016   Procedure: GIVENS CAPSULE STUDY;  Surgeon: Rogene Houston, MD;  [facility];  Service: Endoscopy;  Laterality: N/A;   HERNIA REPAIR     POLYPECTOMY  11/13/2015   Procedure: POLYPECTOMY;  Surgeon: Rogene Houston, MD;  [facility];  Service: Endoscopy;;  colon   POLYPECTOMY  06/17/2020   Procedure: POLYPECTOMY;  Surgeon: Rogene Houston, MD;  [facility];  Service: Endoscopy;;  sigmoid   Right Elbow Right    A pin was put in    Social History   Socioeconomic History   Marital status: Married    Spouse name: Not on file   Number of children: Not on file   Years of education: Not on file   Highest education level: Not on file  Occupational History   Not on file  Tobacco Use   Smoking status: Former    Types: Cigarettes    Quit date: 05/25/1989    Years since quitting: 31.5   Smokeless tobacco: Never  Vaping Use   Vaping Use: Never used  Substance and Sexual Activity   Alcohol use: No    Alcohol/week: 0.0 standard drinks    Comment: Drank many years - 30 years ago   Drug use: No   Sexual activity: Not on file  Other Topics Concern   Not on file  Social History Narrative   Not  on file   Social Determinants of Health   Financial Resource Strain: Not on file  Food Insecurity: Not on file  Transportation Needs: Not on file  Physical Activity: Not on file  Stress: Not on file  Social Connections: Not on file  Intimate Partner Violence: Not on file    Family History  Problem Relation Age of Onset   Emphysema Mother    Cerebrovascular Accident Father    Heart disease Father    Diabetes Sister  Heart disease Brother    Heart disease Sister    Heart disease Brother    Diabetes Brother        Objective: Vitals:   11/19/20 1312  BP: 125/62  Pulse: 97     Physical Exam  Lab Results:  Recent Results (from the past 2160 hour(s))  PSA     Status: None   Collection Time: 11/13/20 11:19 AM  Result Value Ref Range   Prostate Specific Ag, Serum 0.2 0.0 - 4.0 ng/mL    Comment: Roche ECLIA methodology. According to the American Urological Association, Serum PSA should decrease and remain at undetectable levels after radical prostatectomy. The AUA defines biochemical recurrence as an initial PSA value 0.2 ng/mL or greater followed by a subsequent confirmatory PSA value 0.2 ng/mL or greater. Values obtained with different assay methods or kits cannot be used interchangeably. Results cannot be interpreted as absolute evidence of the presence or absence of malignant disease.   Urinalysis, Routine w reflex microscopic     Status: Abnormal   Collection Time: 11/19/20  1:42 PM  Result Value Ref Range   Specific Gravity, UA 1.010 1.005 - 1.030   pH, UA 5.5 5.0 - 7.5   Color, UA Yellow Yellow   Appearance Ur Hazy (A) Clear   Leukocytes,UA Negative Negative   Protein,UA Negative Negative/Trace   Glucose, UA Negative Negative   Ketones, UA Negative Negative   RBC, UA 1+ (A) Negative   Bilirubin, UA Negative Negative   Urobilinogen, Ur 0.2 0.2 - 1.0 mg/dL   Nitrite, UA Negative Negative   Microscopic Examination See below:   Microscopic Examination      Status: Abnormal   Collection Time: 11/19/20  1:42 PM   Urine  Result Value Ref Range   WBC, UA >30 (A) 0 - 5 /hpf   RBC 3-10 (A) 0 - 2 /hpf   Epithelial Cells (non renal) 0-10 0 - 10 /hpf   Renal Epithel, UA None seen None seen /hpf   Mucus, UA Present Not Estab.   Bacteria, UA Few (A) None seen/Few      Studies/Results: No results found.    Assessment & Plan: History of prostate cancer.   He is doing well with a stable low PSA.   F/U in 1 year with a PSA.  Dysuria.  He appears to have a UTI today.  I will get a culture and treat.   BXO.  He has no new complaints regarding the penis.  History of stones.  He has had no hematuria or flank pain.    Meds ordered this encounter  Medications   nitrofurantoin, macrocrystal-monohydrate, (MACROBID) 100 MG capsule    Sig: Take 1 capsule (100 mg total) by mouth every 12 (twelve) hours.    Dispense:  14 capsule    Refill:  0      Orders Placed This Encounter  Procedures   Urine Culture   Microscopic Examination   PSA    Standing Status:   Future    Standing Expiration Date:   11/19/2021   Urinalysis, Routine w reflex microscopic       Return in about 4 weeks (around 12/17/2020) for He will also need f/u in a year with a PSA.Marland Kitchen   CC: Sasser, Silvestre Moment, MD      Tyler Farmer 11/19/2020 Patient ID: Tyler Farmer, male   DOB: 1940-08-01, 80 y.o.   MRN: [id number]

## 2020-11-19 NOTE — Progress Notes (Signed)

## 2020-11-20 ENCOUNTER — Ambulatory Visit: Payer: Medicare Other | Admitting: Urology

## 2020-11-23 LAB — URINE CULTURE

## 2020-11-24 ENCOUNTER — Telehealth: Payer: Self-pay

## 2020-11-24 ENCOUNTER — Other Ambulatory Visit: Payer: Self-pay

## 2020-11-24 ENCOUNTER — Other Ambulatory Visit: Payer: Self-pay | Admitting: Urology

## 2020-11-24 DIAGNOSIS — N3001 Acute cystitis with hematuria: Secondary | ICD-10-CM

## 2020-11-24 MED ORDER — SULFAMETHOXAZOLE-TRIMETHOPRIM 800-160 MG PO TABS
1.0000 | ORAL_TABLET | Freq: Two times a day (BID) | ORAL | 0 refills | Status: DC
Start: 2020-11-24 — End: 2021-01-28

## 2020-11-24 NOTE — Telephone Encounter (Signed)
Wife called and made aware for patient to stop Nitrofurantoin and start Bactrim DS. Wife voiced understanding.

## 2020-12-02 DIAGNOSIS — S62323D Displaced fracture of shaft of third metacarpal bone, left hand, subsequent encounter for fracture with routine healing: Secondary | ICD-10-CM | POA: Diagnosis not present

## 2020-12-02 DIAGNOSIS — M25632 Stiffness of left wrist, not elsewhere classified: Secondary | ICD-10-CM | POA: Diagnosis not present

## 2020-12-02 DIAGNOSIS — S62325D Displaced fracture of shaft of fourth metacarpal bone, left hand, subsequent encounter for fracture with routine healing: Secondary | ICD-10-CM | POA: Diagnosis not present

## 2020-12-02 DIAGNOSIS — M25532 Pain in left wrist: Secondary | ICD-10-CM | POA: Diagnosis not present

## 2020-12-02 DIAGNOSIS — M25642 Stiffness of left hand, not elsewhere classified: Secondary | ICD-10-CM | POA: Diagnosis not present

## 2020-12-02 DIAGNOSIS — M25442 Effusion, left hand: Secondary | ICD-10-CM | POA: Diagnosis not present

## 2020-12-02 DIAGNOSIS — M79642 Pain in left hand: Secondary | ICD-10-CM | POA: Diagnosis not present

## 2020-12-02 DIAGNOSIS — R531 Weakness: Secondary | ICD-10-CM | POA: Diagnosis not present

## 2020-12-07 DIAGNOSIS — S62323D Displaced fracture of shaft of third metacarpal bone, left hand, subsequent encounter for fracture with routine healing: Secondary | ICD-10-CM | POA: Diagnosis not present

## 2020-12-07 DIAGNOSIS — S62325D Displaced fracture of shaft of fourth metacarpal bone, left hand, subsequent encounter for fracture with routine healing: Secondary | ICD-10-CM | POA: Diagnosis not present

## 2020-12-07 DIAGNOSIS — M25532 Pain in left wrist: Secondary | ICD-10-CM | POA: Diagnosis not present

## 2020-12-07 DIAGNOSIS — M25632 Stiffness of left wrist, not elsewhere classified: Secondary | ICD-10-CM | POA: Diagnosis not present

## 2020-12-07 DIAGNOSIS — M25442 Effusion, left hand: Secondary | ICD-10-CM | POA: Diagnosis not present

## 2020-12-07 DIAGNOSIS — M79642 Pain in left hand: Secondary | ICD-10-CM | POA: Diagnosis not present

## 2020-12-07 DIAGNOSIS — R531 Weakness: Secondary | ICD-10-CM | POA: Diagnosis not present

## 2020-12-07 DIAGNOSIS — M25642 Stiffness of left hand, not elsewhere classified: Secondary | ICD-10-CM | POA: Diagnosis not present

## 2020-12-10 DIAGNOSIS — E039 Hypothyroidism, unspecified: Secondary | ICD-10-CM | POA: Diagnosis not present

## 2020-12-10 DIAGNOSIS — Z1329 Encounter for screening for other suspected endocrine disorder: Secondary | ICD-10-CM | POA: Diagnosis not present

## 2020-12-16 ENCOUNTER — Ambulatory Visit: Payer: Medicare Other | Admitting: Cardiology

## 2020-12-17 ENCOUNTER — Ambulatory Visit: Payer: Medicare Other | Admitting: Urology

## 2021-01-13 DIAGNOSIS — Z23 Encounter for immunization: Secondary | ICD-10-CM | POA: Diagnosis not present

## 2021-01-13 DIAGNOSIS — N3090 Cystitis, unspecified without hematuria: Secondary | ICD-10-CM | POA: Diagnosis not present

## 2021-01-27 DIAGNOSIS — K7689 Other specified diseases of liver: Secondary | ICD-10-CM | POA: Diagnosis not present

## 2021-01-27 DIAGNOSIS — K8020 Calculus of gallbladder without cholecystitis without obstruction: Secondary | ICD-10-CM | POA: Diagnosis not present

## 2021-01-27 DIAGNOSIS — R790 Abnormal level of blood mineral: Secondary | ICD-10-CM | POA: Diagnosis not present

## 2021-01-27 DIAGNOSIS — I868 Varicose veins of other specified sites: Secondary | ICD-10-CM | POA: Diagnosis not present

## 2021-01-27 DIAGNOSIS — D649 Anemia, unspecified: Secondary | ICD-10-CM | POA: Diagnosis not present

## 2021-01-27 DIAGNOSIS — R109 Unspecified abdominal pain: Secondary | ICD-10-CM | POA: Diagnosis not present

## 2021-01-27 DIAGNOSIS — K766 Portal hypertension: Secondary | ICD-10-CM | POA: Insufficient documentation

## 2021-01-27 DIAGNOSIS — J90 Pleural effusion, not elsewhere classified: Secondary | ICD-10-CM | POA: Diagnosis not present

## 2021-01-27 DIAGNOSIS — E119 Type 2 diabetes mellitus without complications: Secondary | ICD-10-CM | POA: Diagnosis not present

## 2021-01-27 DIAGNOSIS — R590 Localized enlarged lymph nodes: Secondary | ICD-10-CM | POA: Diagnosis not present

## 2021-01-27 DIAGNOSIS — R1084 Generalized abdominal pain: Secondary | ICD-10-CM | POA: Diagnosis not present

## 2021-01-27 DIAGNOSIS — Z20822 Contact with and (suspected) exposure to covid-19: Secondary | ICD-10-CM | POA: Diagnosis not present

## 2021-01-27 DIAGNOSIS — R079 Chest pain, unspecified: Secondary | ICD-10-CM | POA: Insufficient documentation

## 2021-01-27 DIAGNOSIS — N179 Acute kidney failure, unspecified: Secondary | ICD-10-CM | POA: Diagnosis not present

## 2021-01-27 DIAGNOSIS — I509 Heart failure, unspecified: Secondary | ICD-10-CM | POA: Diagnosis not present

## 2021-01-27 DIAGNOSIS — K7460 Unspecified cirrhosis of liver: Secondary | ICD-10-CM | POA: Diagnosis not present

## 2021-01-27 DIAGNOSIS — E785 Hyperlipidemia, unspecified: Secondary | ICD-10-CM | POA: Diagnosis not present

## 2021-01-27 DIAGNOSIS — D72819 Decreased white blood cell count, unspecified: Secondary | ICD-10-CM | POA: Diagnosis not present

## 2021-01-27 DIAGNOSIS — K828 Other specified diseases of gallbladder: Secondary | ICD-10-CM | POA: Diagnosis not present

## 2021-01-27 DIAGNOSIS — I77811 Abdominal aortic ectasia: Secondary | ICD-10-CM | POA: Diagnosis not present

## 2021-01-27 DIAGNOSIS — Z880 Allergy status to penicillin: Secondary | ICD-10-CM | POA: Diagnosis not present

## 2021-01-27 DIAGNOSIS — R0602 Shortness of breath: Secondary | ICD-10-CM | POA: Diagnosis not present

## 2021-01-27 DIAGNOSIS — R0789 Other chest pain: Secondary | ICD-10-CM | POA: Diagnosis not present

## 2021-01-27 DIAGNOSIS — R7989 Other specified abnormal findings of blood chemistry: Secondary | ICD-10-CM | POA: Insufficient documentation

## 2021-01-27 DIAGNOSIS — D490 Neoplasm of unspecified behavior of digestive system: Secondary | ICD-10-CM | POA: Insufficient documentation

## 2021-01-27 HISTORY — DX: Acute kidney failure, unspecified: N17.9

## 2021-01-28 ENCOUNTER — Encounter (INDEPENDENT_AMBULATORY_CARE_PROVIDER_SITE_OTHER): Payer: Self-pay

## 2021-01-28 ENCOUNTER — Other Ambulatory Visit: Payer: Self-pay

## 2021-01-28 ENCOUNTER — Encounter (INDEPENDENT_AMBULATORY_CARE_PROVIDER_SITE_OTHER): Payer: Self-pay | Admitting: Gastroenterology

## 2021-01-28 ENCOUNTER — Other Ambulatory Visit (INDEPENDENT_AMBULATORY_CARE_PROVIDER_SITE_OTHER): Payer: Self-pay

## 2021-01-28 ENCOUNTER — Ambulatory Visit (INDEPENDENT_AMBULATORY_CARE_PROVIDER_SITE_OTHER): Payer: Medicare Other | Admitting: Gastroenterology

## 2021-01-28 VITALS — BP 102/63 | HR 122 | Temp 97.8°F | Ht 69.0 in | Wt 190.0 lb

## 2021-01-28 DIAGNOSIS — I8510 Secondary esophageal varices without bleeding: Secondary | ICD-10-CM

## 2021-01-28 DIAGNOSIS — K7460 Unspecified cirrhosis of liver: Secondary | ICD-10-CM | POA: Diagnosis not present

## 2021-01-28 DIAGNOSIS — R188 Other ascites: Secondary | ICD-10-CM

## 2021-01-28 DIAGNOSIS — R932 Abnormal findings on diagnostic imaging of liver and biliary tract: Secondary | ICD-10-CM | POA: Diagnosis not present

## 2021-01-28 DIAGNOSIS — I10 Essential (primary) hypertension: Secondary | ICD-10-CM

## 2021-01-28 DIAGNOSIS — K7581 Nonalcoholic steatohepatitis (NASH): Secondary | ICD-10-CM

## 2021-01-28 MED ORDER — CARVEDILOL 3.125 MG PO TABS: 3.1250 mg | ORAL_TABLET | Freq: Two times a day (BID) | ORAL | 1 refills | Status: DC

## 2021-01-28 NOTE — Patient Instructions (Addendum)
Schedule EGD in 4 weeks Schedule MRCP with IV contrast Perform blood workup Start Coreg 3.125 mg twice a day Continue furosemide 20 mg qday and spironolactone 50 mg qday Follow up with cardiologist regarding heart rate and shortness of breath - I will reach them as well - Reduce salt intake to <2 g per day - Can take Tylenol max of 2 g per day (650 mg q8h) for pain - Avoid NSAIDs for pain - Avoid eating raw oysters/shellfish - Ensure every night before going to sleep If you have anew episode of tremor/shivers with abdominal/back pain or nausea/vomiting, please go to the ER at [facility]) or [place] at [facility]

## 2021-01-28 NOTE — Progress Notes (Signed)
Tyler Farmer, M.D. Gastroenterology & Hepatology [facility] [address] [place], [place] [address]  Primary Care Physician: Tyler Hilding, MD [place] [id number]  I will communicate my assessment and recommendations to the referring MD via EMR.  Problems: NASH cirrhosis Esophageal varices Ascites History of gastric AV malformation status post APC ablation  History of Present Illness: Tyler Farmer is a 80 y.o. male with past medical history of Tyler Farmer cirrhosis complicated by nonbleeding grade 2 esophageal varices, ascites, history of bleeding gastric AVM, peptic ulcer disease status post Billroth II gastrectomy, atrial fibrillation, diabetes, GERD, hyperlipidemia, hypertension, prostate cancer, who presents for follow up of cirrhosis.  The patient was last seen on 07/14/2020. At that time, the patient was continued on furosemide 20 mg daily and spironolactone 50 mg daily for ascites.  Patient reports that a month ago he presented an episode of nausea and tremors which lasted for 4 days. States that last week on Tuesday he presented recurrent nausea episodes and pain in the epigastirc area, as well as in the area of the back. He only vomited once. Had bloating than resolved throught the night.Due to this, the patient decided to go to [facility] yesterday.  He underwent blood work-up that showed CMP with creatinine of 1.77, sodium 135, potassium 4.9, alkaline phosphatase 174, albumin 3.2, AST 23, ALT 29, potassium 5.0, lipase 123, CBC with platelets of 118, wbc count of 3.3 and hemoglobin of 8.8 with MCV of 92.  He had a markedly increased proBNP of 5045.Marland Kitchen  As part of his investigations he underwent a CT of the abdomen and pelvis with IV contrast showing changes of cirrhosis in the liver with dilated portal vein and splenomegaly.  There was a small volume of ascites.  The gallbladder showed increased thickening and  enhancement which was initially concerning for possible malignancy, as well as mildly enlarged retroperitoneal lymph nodes with amorphus soft tissue mass in the upper left retroperitoneum.  A right upper quadrant ultrasound was performed which showed an ovular liver without masses.  There was cholelithiasis with negative Murphy sign.  Gallbladder was thickened.  Chest x-ray showed prominent interstitial lung markings.  Patient states that he was advised to go by his own methods to a tertiary center as if he required surgery he will need a bigger center to take care of his ongoing problems.  There is no documentation of this in the medical chart. The patinet left AMA from [facility] -he reports that he felt better afterwards and went home.  Reports that the pain went away. Denies having nausea or vomit right now.  States that he is feeling fine and denies having any ongoing complaints. The patient denies having any fever, chills, hematochezia, melena, hematemesis, abdominal distention, abdominal pain, diarrhea, jaundice, pruritus or weight loss (lost in the past but now stable). Statrs his stools were black as he was taking iron, but stopped it today due to his abdominal symptoms.  Denies drinking any alcohol.  Cirrhosis related questions: Hematemesis/coffee ground No  History of variceal bleeding: No Abdominal distention/worsening ascitesNo Episodes of confusion/disorientation: No Taking diuretics?: yes,  furosemide 20 mg qday and spironolactone 50 mg qday Prior history of banding?: No Prior episodes of SBP: No Last time liver imaging was performed: 01/27/2021 - liver US - no masses, cirrhotic liver, Gallbladder wall thickening with a negative  sonographic Murphy sign. Differential considerations include  cholelithiasis versus reactive gallbladder wall thickening as can  be  seen in the setting of hepatocellular disease. Given the nodular  thickening and enhancement on the recent CT of the  abdomen,  follow-up MRI of the abdomen without and with intravenous contrast  is recommended.  Last AFP 1.0 (05/05/2020) MELD score:@MELD @    Last EGD: 06/17/2020, grade 2 esophageal varices in the distal esophagus, hematin was found in the gastric body with a single AVM in the lesser curvature of the stomach ablated with APC, moderate portal hypertensive gastropathy, patent Billroth II gastrojejunostomy, focal erosions in the duodenum in the efferent limb. Last Colonoscopy: 06/17/2020, congestion in the cecum, diverticulosis in the sigmoid colon, 3 mm polyp in the sigmoid colon, external hemorrhoids.  Polyp was resected but not retrieved.  Past Medical History: Past Medical History:  Diagnosis Date   Arthritis    Hands/Fingers   Back pain    Cirrhosis of liver ([place])    Diabetes mellitus ([place])    Enlarged prostate    Gastric ulcer    GERD (gastroesophageal reflux disease)    H/O bladder problems    Hypercholesteremia    Hypertension    Kidney stone    Prostate cancer ([place])    Thyroid disease    UGI bleed 10/26/2016   Vitamin B12 deficiency 10/26/2016    Past Surgical History: Past Surgical History:  Procedure Laterality Date   CIRCUMCISION     at age 77   COLONOSCOPY N/A 11/13/2015   Procedure: COLONOSCOPY;  Surgeon: Rogene Houston, MD;  [facility];  Service: Endoscopy;  Laterality: N/A;  2:10   COLONOSCOPY WITH PROPOFOL N/A 06/17/2020   Procedure: COLONOSCOPY WITH PROPOFOL;  Surgeon: Rogene Houston, MD;  [facility];  Service: Endoscopy;  Laterality: N/A;  AM / patient had positive covid 05/25/20   ESOPHAGOGASTRODUODENOSCOPY N/A 06/03/2014   Procedure: ESOPHAGOGASTRODUODENOSCOPY (EGD);  Surgeon: Rogene Houston, MD;  [facility];  Service: Endoscopy;  Laterality: N/A;  730   ESOPHAGOGASTRODUODENOSCOPY N/A 10/26/2016   Procedure: ESOPHAGOGASTRODUODENOSCOPY (EGD);  Surgeon: Rogene Houston, MD;  [facility];  Service: Endoscopy;   Laterality: N/A;   ESOPHAGOGASTRODUODENOSCOPY (EGD) WITH PROPOFOL N/A 06/17/2020   Procedure: ESOPHAGOGASTRODUODENOSCOPY (EGD) WITH PROPOFOL;  Surgeon: Rogene Houston, MD;  [facility];  Service: Endoscopy;  Laterality: N/A;   Gastric Ulcer  1993   GIVENS CAPSULE STUDY N/A 10/28/2016   Procedure: GIVENS CAPSULE STUDY;  Surgeon: Rogene Houston, MD;  [facility];  Service: Endoscopy;  Laterality: N/A;   HERNIA REPAIR     POLYPECTOMY  11/13/2015   Procedure: POLYPECTOMY;  Surgeon: Rogene Houston, MD;  [facility];  Service: Endoscopy;;  colon   POLYPECTOMY  06/17/2020   Procedure: POLYPECTOMY;  Surgeon: Rogene Houston, MD;  [facility];  Service: Endoscopy;;  sigmoid   Right Elbow Right    A pin was put in    Family History: Family History  Problem Relation Age of Onset   Emphysema Mother    Cerebrovascular Accident Father    Heart disease Father    Diabetes Sister    Heart disease Brother    Heart disease Sister    Heart disease Brother    Diabetes Brother     Social History: Social History   Tobacco Use  Smoking Status Former   Types: Cigarettes   Quit date: 05/25/1989   Years since quitting: 31.7  Smokeless Tobacco Never   Social History   Substance  and Sexual Activity  Alcohol Use No   Alcohol/week: 0.0 standard drinks   Comment: Drank many years - 30 years ago   Social History   Substance and Sexual Activity  Drug Use No    Allergies: Allergies  Allergen Reactions   Penicillins Nausea And Vomiting    Has patient had a PCN reaction causing immediate rash, facial/tongue/throat swelling, SOB or lightheadedness with hypotension: Yes Has patient had a PCN reaction causing severe rash involving mucus membranes or skin necrosis: No Has patient had a PCN reaction that required hospitalization: No Has patient had a PCN reaction occurring within the last 10 years: No If all of the above answers are "NO", then may  proceed with Cephalosporin use.   Patient states that it also caused pain in his sides.    Medications: Current Outpatient Medications  Medication Sig Dispense Refill   acetaminophen (TYLENOL) 500 MG tablet Take 500 mg by mouth every 6 (six) hours as needed.     aspirin EC 81 MG tablet Take 81 mg by mouth daily. Swallow whole.     carvedilol (COREG) 3.125 MG tablet Take 1 tablet (3.125 mg total) by mouth 2 (two) times daily with a meal. 60 tablet 1   Cranberry 500 MG TABS Take 500 mg by mouth daily.     dicyclomine (BENTYL) 10 MG capsule Take 1 capsule (10 mg total) by mouth 2 (two) times daily as needed for spasms.     ferrous sulfate 325 (65 FE) MG tablet Take 325 mg by mouth daily with breakfast.     furosemide (LASIX) 20 MG tablet Take 1 tablet by mouth once daily 90 tablet 1   glipiZIDE (GLUCOTROL) 10 MG tablet Take 10 mg by mouth 2 (two) times daily before a meal.     levothyroxine (SYNTHROID) 150 MCG tablet Take 150 mcg by mouth daily before breakfast.     lisinopril (ZESTRIL) 40 MG tablet Take 1 tablet (40 mg total) by mouth daily. (Patient taking differently: Take 20 mg by mouth daily.) 30 tablet 6   magnesium oxide (MAG-OX) 400 MG tablet Take 400 mg by mouth daily.     metFORMIN (GLUCOPHAGE) 850 MG tablet Take 850 mg by mouth 3 (three) times daily with meals.     simvastatin (ZOCOR) 40 MG tablet Take 40 mg by mouth at bedtime.      spironolactone (ALDACTONE) 50 MG tablet Take 1 tablet (50 mg total) by mouth daily. 30 tablet 5   No current facility-administered medications for this visit.    Review of Systems: GENERAL: negative for malaise, night sweats HEENT: No changes in hearing or vision, no nose bleeds or other nasal problems. NECK: Negative for lumps, goiter, pain and significant neck swelling RESPIRATORY: Negative for cough, wheezing CARDIOVASCULAR: Negative for chest pain, leg swelling, palpitations, orthopnea GI: SEE HPI MUSCULOSKELETAL: Negative for joint pain or  swelling, back pain, and muscle pain. SKIN: Negative for lesions, rash PSYCH: Negative for sleep disturbance, mood disorder and recent psychosocial stressors. HEMATOLOGY Negative for prolonged bleeding, bruising easily, and swollen nodes. ENDOCRINE: Negative for cold or heat intolerance, polyuria, polydipsia and goiter. NEURO: negative for tremor, gait imbalance, syncope and seizures. The remainder of the review of systems is noncontributory.   Physical Exam: BP 102/63 (BP Location: Right Arm, Patient Position: Sitting, Cuff Size: Large)   Pulse (!) 122   Temp 97.8 F (36.6 C) (Oral)   Ht 5' 9"  (1.753 m)   Wt 190 lb (86.2 kg)   BMI  28.06 kg/m  GENERAL: The patient is AO x3, in no acute distress. HEENT: Head is normocephalic and atraumatic. EOMI are intact. Mouth is well hydrated and without lesions. NECK: Supple. No masses LUNGS: Clear to auscultation. No presence of rhonchi/wheezing/rales. Adequate chest expansion HEART: tachycardic, slightly irregular, repeat HR 108 bpm, normal s1 and s2. ABDOMEN: Soft, nontender, no guarding, no peritoneal signs, and nondistended. BS +. No masses. EXTREMITIES: Without any cyanosis, clubbing, rash, lesions or edema. NEUROLOGIC: AOx3, no focal motor deficit. SKIN: no jaundice, no rashes  Imaging/Labs: as above  I personally reviewed and interpreted the available labs, imaging and endoscopic files.  Impression and Plan: Tyler Farmer is a 80 y.o. male with past medical history of Tyler Farmer cirrhosis complicated by nonbleeding grade 2 esophageal varices, ascites, history of bleeding gastric AVM, peptic ulcer disease status post Billroth II gastrectomy, atrial fibrillation, diabetes, GERD, hyperlipidemia, hypertension, prostate cancer, who presents for follow up of cirrhosis.  In terms of his cirrhosis, he has presented only ascites as a decompensating event which has responded to his current diuretic dosage.  He had minimal ascites in the most recent  scan performed yesterday and clinically he does not have ascites, will continue at the same dosage for now.  He has not presented any variceal bleeding episodes, but given the presence of grade 2 esophageal varices, he is due for surveillance.  We will set up an EGD in 4 weeks, but I recommended the patient to be seen by his cardiologist regarding his elevated BNP during the most recent ER visit.  I will also discussed with the cardiologist his tachycardia.  For now, I will start him on carvedilol 3.125 mg twice a day which will help with a heart rate but also for variceal bleeding prophylaxis.  We will uptitrate the dosage based on tolerance and response.  We will check repeat INR and AFP today for [facility] screening purposes and to complete MELD labs.  Finally, given unclear findings on CT scan (I do not have the imaging available to me), will proceed with MRCP to evaluate the gallbladder and biliary tree further.  Importantly, I advised the patient that his episodes of nausea and tremors may be better evaluated tertiary center next time as if he has an episode of cholecystitis, he would require a comprehensive team at tertiary center for surgical approach given his mortality risk due to cirrhosis.  -Schedule EGD in 4 weeks - Schedule MRCP with IV contrast - Check AFP and INR - Start Coreg 3.125 mg twice a day - Continue furosemide 20 mg qday and spironolactone 50 mg qday - Follow up with cardiologist regarding heart rate and shortness of breath  - Reduce salt intake to <2 g per day - Can take Tylenol max of 2 g per day (650 mg q8h) for pain - Avoid NSAIDs for pain - Avoid eating raw oysters/shellfish - Ensure every night before going to sleep - If patient has anew episode of tremor/shivers with abdominal/back pain or nausea/vomiting, please go to the ER at [facility]) or [place] [facility] for evaluation of cholecystitis. - RTC 6 months  All questions were answered.      Harvel Quale, MD Gastroenterology and Hepatology [facility]

## 2021-01-29 LAB — PROTIME-INR
INR: 1.2 — ABNORMAL HIGH
Prothrombin Time: 11.9 s — ABNORMAL HIGH (ref 9.0–11.5)

## 2021-01-29 LAB — AFP TUMOR MARKER: AFP-Tumor Marker: 0.6 ng/mL (ref <6.1)

## 2021-02-01 DIAGNOSIS — I509 Heart failure, unspecified: Secondary | ICD-10-CM | POA: Diagnosis not present

## 2021-02-01 DIAGNOSIS — K7460 Unspecified cirrhosis of liver: Secondary | ICD-10-CM | POA: Diagnosis not present

## 2021-02-01 DIAGNOSIS — R188 Other ascites: Secondary | ICD-10-CM | POA: Diagnosis not present

## 2021-02-01 DIAGNOSIS — I4891 Unspecified atrial fibrillation: Secondary | ICD-10-CM | POA: Diagnosis not present

## 2021-02-02 ENCOUNTER — Ambulatory Visit: Payer: Medicare Other | Admitting: Cardiology

## 2021-02-02 ENCOUNTER — Encounter: Payer: Self-pay | Admitting: Cardiology

## 2021-02-02 VITALS — BP 90/58 | HR 91 | Ht 69.0 in | Wt 193.0 lb

## 2021-02-02 DIAGNOSIS — I480 Paroxysmal atrial fibrillation: Secondary | ICD-10-CM

## 2021-02-02 DIAGNOSIS — I10 Essential (primary) hypertension: Secondary | ICD-10-CM | POA: Diagnosis not present

## 2021-02-02 DIAGNOSIS — R0789 Other chest pain: Secondary | ICD-10-CM | POA: Diagnosis not present

## 2021-02-02 MED ORDER — LISINOPRIL 5 MG PO TABS: 5.0000 mg | ORAL_TABLET | Freq: Every day | ORAL | 6 refills | Status: DC

## 2021-02-02 NOTE — Patient Instructions (Signed)
Medication Instructions:  Decrease Lisinopril to 59m daily  Continue all other medications.     Labwork: none  Testing/Procedures: none  Follow-Up: 4 months   Any Other Special Instructions Will Be Listed Below (If Applicable).   If you need a refill on your cardiac medications before your next appointment, please call your pharmacy.

## 2021-02-02 NOTE — Progress Notes (Signed)
Clinical Summary Tyler Farmer is a 80 y.o.male seen today for follow up of the following medical problems.        1.Cardiac arrhythmias/Afib/SVT -zio patch with rare ventricular ectopy. Frequent supraventricular ectopy, runs of SVT with longest 20 beats. 3.2 sec and 2.2 sec pauses in afternoon.  - afib previously diagnosed by pcp. I have reviewed the pcp EKG from diagnosis which confirms afib though we did not see it on recent monitor. He is actually in afib today as well by EKG - elevated HRs at recent GI appt, was started on coreg for both varices and elevated HRs.  - off eliquis, I assume due to prior GI bleed and history of varices.      2. Chest pain - left sided, dull pain. Can occur at rest or with activity. Not positional. Lasts just 1 second. Fairly infrequent.    - recent chest pain. Across midchest, dull pain. 8/10 in severity, +SOB - can last 30 minutes, sometimes longer - can occur at rest or with activity - not positional - at one point was daily - started on coreg 3.176m bid by GI for varices   3. Cirrhosis   4. History of GI bleeding - from GI had bleeding from gastric AVM, ablated. Melena resolved - also with esophageal varices.  Past Medical History:  Diagnosis Date   Arthritis    Hands/Fingers   Back pain    Cirrhosis of liver (HCC)    Diabetes mellitus ([place]    Enlarged prostate    Gastric ulcer    GERD (gastroesophageal reflux disease)    H/O bladder problems    Hypercholesteremia    Hypertension    Kidney stone    Prostate cancer ([facility]    Thyroid disease    UGI bleed 10/26/2016   Vitamin B12 deficiency 10/26/2016     Allergies  Allergen Reactions   Penicillins Nausea And Vomiting    Has patient had a PCN reaction causing immediate rash, facial/tongue/throat swelling, SOB or lightheadedness with hypotension: Yes Has patient had a PCN reaction causing severe rash involving mucus membranes or skin necrosis: No Has patient had a PCN  reaction that required hospitalization: No Has patient had a PCN reaction occurring within the last 10 years: No If all of the above answers are "NO", then may proceed with Cephalosporin use.   Patient states that it also caused pain in his sides.     Current Outpatient Medications  Medication Sig Dispense Refill   acetaminophen (TYLENOL) 500 MG tablet Take 500 mg by mouth every 6 (six) hours as needed.     aspirin EC 81 MG tablet Take 81 mg by mouth daily. Swallow whole.     carvedilol (COREG) 3.125 MG tablet Take 1 tablet (3.125 mg total) by mouth 2 (two) times daily with a meal. 60 tablet 1   Cranberry 500 MG TABS Take 500 mg by mouth daily.     dicyclomine (BENTYL) 10 MG capsule Take 1 capsule (10 mg total) by mouth 2 (two) times daily as needed for spasms.     ferrous sulfate 325 (65 FE) MG tablet Take 325 mg by mouth daily with breakfast.     furosemide (LASIX) 20 MG tablet Take 1 tablet by mouth once daily 90 tablet 1   glipiZIDE (GLUCOTROL) 10 MG tablet Take 10 mg by mouth 2 (two) times daily before a meal.     levothyroxine (SYNTHROID) 150 MCG tablet Take 150 mcg by mouth daily  before breakfast.     lisinopril (ZESTRIL) 40 MG tablet Take 1 tablet (40 mg total) by mouth daily. (Patient taking differently: Take 20 mg by mouth daily.) 30 tablet 6   magnesium oxide (MAG-OX) 400 MG tablet Take 400 mg by mouth daily.     metFORMIN (GLUCOPHAGE) 850 MG tablet Take 850 mg by mouth 3 (three) times daily with meals.     simvastatin (ZOCOR) 40 MG tablet Take 40 mg by mouth at bedtime.      spironolactone (ALDACTONE) 50 MG tablet Take 1 tablet (50 mg total) by mouth daily. 30 tablet 5   No current facility-administered medications for this visit.     Past Surgical History:  Procedure Laterality Date   CIRCUMCISION     at age 30   COLONOSCOPY N/A 11/13/2015   Procedure: COLONOSCOPY;  Surgeon: Rogene Houston, MD;  [facility]: [facility];  Service: Endoscopy;  Laterality: N/A;  2:10    COLONOSCOPY WITH PROPOFOL N/A 06/17/2020   Procedure: COLONOSCOPY WITH PROPOFOL;  Surgeon: Rogene Houston, MD;  Location: [facility];  Service: Endoscopy;  Laterality: N/A;  AM / patient had positive covid 05/25/20   ESOPHAGOGASTRODUODENOSCOPY N/A 06/03/2014   Procedure: ESOPHAGOGASTRODUODENOSCOPY (EGD);  Surgeon: Rogene Houston, MD;  [facility]: [facility];  Service: Endoscopy;  Laterality: N/A;  730   ESOPHAGOGASTRODUODENOSCOPY N/A 10/26/2016   Procedure: ESOPHAGOGASTRODUODENOSCOPY (EGD);  Surgeon: Rogene Houston, MD;  [facility];  Service: Endoscopy;  Laterality: N/A;   ESOPHAGOGASTRODUODENOSCOPY (EGD) WITH PROPOFOL N/A 06/17/2020   Procedure: ESOPHAGOGASTRODUODENOSCOPY (EGD) WITH PROPOFOL;  Surgeon: Rogene Houston, MD;  [facility]: [facility];  Service: Endoscopy;  Laterality: N/A;   Gastric Ulcer  1993   GIVENS CAPSULE STUDY N/A 10/28/2016   Procedure: GIVENS CAPSULE STUDY;  Surgeon: Rogene Houston, MD;  [facility];  Service: Endoscopy;  Laterality: N/A;   HERNIA REPAIR     POLYPECTOMY  11/13/2015   Procedure: POLYPECTOMY;  Surgeon: Rogene Houston, MD;  [facility];  Service: Endoscopy;;  colon   POLYPECTOMY  06/17/2020   Procedure: POLYPECTOMY;  Surgeon: Rogene Houston, MD;  [facility];  Service: Endoscopy;;  sigmoid   Right Elbow Right    A pin was put in     Allergies  Allergen Reactions   Penicillins Nausea And Vomiting    Has patient had a PCN reaction causing immediate rash, facial/tongue/throat swelling, SOB or lightheadedness with hypotension: Yes Has patient had a PCN reaction causing severe rash involving mucus membranes or skin necrosis: No Has patient had a PCN reaction that required hospitalization: No Has patient had a PCN reaction occurring within the last 10 years: No If all of the above answers are "NO", then may proceed with Cephalosporin use.   Patient states that it also caused pain in his sides.       Family History  Problem Relation Age of Onset   Emphysema Mother    Cerebrovascular Accident Father    Heart disease Father    Diabetes Sister    Heart disease Brother    Heart disease Sister    Heart disease Brother    Diabetes Brother      Social History Mr. Peavy reports that he quit smoking about 31 years ago. He has never used smokeless tobacco. Mr. Kenley reports no history of alcohol use.   Review of Systems CONSTITUTIONAL: No weight loss, fever, chills, weakness or fatigue.  HEENT: Eyes:  No visual loss, blurred vision, double vision or yellow sclerae.No hearing loss, sneezing, congestion, runny nose or sore throat.  SKIN: No rash or itching.  CARDIOVASCULAR: per hpi RESPIRATORY: No shortness of breath, cough or sputum.  GASTROINTESTINAL: No anorexia, nausea, vomiting or diarrhea. No abdominal pain or blood.  GENITOURINARY: No burning on urination, no polyuria NEUROLOGICAL: No headache, dizziness, syncope, paralysis, ataxia, numbness or tingling in the extremities. No change in bowel or bladder control.  MUSCULOSKELETAL: No muscle, back pain, joint pain or stiffness.  LYMPHATICS: No enlarged nodes. No history of splenectomy.  PSYCHIATRIC: No history of depression or anxiety.  ENDOCRINOLOGIC: No reports of sweating, cold or heat intolerance. No polyuria or polydipsia.  Marland Kitchen   Physical Examination Today's Vitals   02/02/21 1517  BP: (!) 90/58  Pulse: 91  Weight: 193 lb (87.5 kg)  Height: 5' 9"  (1.753 m)   Body mass index is 28.5 kg/m.  Gen: resting comfortably, no acute distress HEENT: no scleral icterus, pupils equal round and reactive, no palptable cervical adenopathy,  CV: irreg, normal rate Resp: Clear to auscultation bilaterally GI: abdomen is soft, non-tender, non-distended, normal bowel sounds, no hepatosplenomegaly MSK: extremities are warm, no edema.  Skin: warm, no rash Neuro:  no focal deficits Psych: appropriate  affect     Assessment and Plan  1. PAF/PSVT - no afib on recent monitor but certaintly in afib today with controlled rates - continue coreg for both afib rates and varices - reach out to GI about restarting eliquis, prior history of bleeding AVM and also varices, he is due for repeat EGD at the end of the month - careful dosing of av nodal agents, had some mild pauses on zio patch  2. Chest pain - I think was related to afib with RVR, symptoms have resolved with starting of coreg and better rate control - continue to monitor, if recurrent in setting of rate control could consider ischemic testing  3. HTN - low bp's today since starting coreg few days ago, lower lisinopril to 28m daily        JArnoldo Lenis M.D

## 2021-02-08 NOTE — Patient Instructions (Signed)
Tyler Farmer  02/08/2021     @PREFPERIOPPHARMACY @   Your procedure is scheduled on 02/16/2021.  Report to [facility] at 7:00 A.M.  Call this number if you have problems the morning of surgery:  [phone]   Remember:   Please follow the diet instructions given to you by Dr Colman Cater office.       Take these medicines the morning of surgery with A SIP OF WATER : Coreg and Synthroid    Do not wear jewelry, make-up or nail polish.  Do not wear lotions, powders, or perfumes, or deodorant.  Do not shave 48 hours prior to surgery.  Men may shave face and neck.  Do not bring valuables to the hospital.  [facility] is not responsible for any belongings or valuables.  Contacts, dentures or bridgework may not be worn into surgery.  Leave your suitcase in the car.  After surgery it may be brought to your room.  For patients admitted to the hospital, discharge time will be determined by your treatment team.  Patients discharged the day of surgery will not be allowed to drive home.   Name and phone number of your driver:   Family Special instructions:  N/A  Please read over the following fact sheets that you were given. Care and Recovery After Surgery  Upper Endoscopy, Adult Upper endoscopy is a procedure to look inside the upper GI (gastrointestinal) tract. The upper GI tract is made up of: The part of the body that moves food from your mouth to your stomach (esophagus). The stomach. The first part of your small intestine (duodenum). This procedure is also called esophagogastroduodenoscopy (EGD) or gastroscopy. In this procedure, your health care provider passes a thin, flexible tube (endoscope) through your mouth and down your esophagus into your stomach. A small camera is attached to the end of the tube. Images from the camera appear on a monitor in the exam room. During this procedure, your health care provider may also remove a small piece of tissue to be sent to a lab  and examined under a microscope (biopsy). Your health care provider may do an upper endoscopy to diagnose cancers of the upper GI tract. You may also have this procedure to find the cause of other conditions, such as: Stomach pain. Heartburn. Pain or problems when swallowing. Nausea and vomiting. Stomach bleeding. Stomach ulcers. Tell a health care provider about: Any allergies you have. All medicines you are taking, including vitamins, herbs, eye drops, creams, and over-the-counter medicines. Any problems you or family members have had with anesthetic medicines. Any blood disorders you have. Any surgeries you have had. Any medical conditions you have. Whether you are pregnant or may be pregnant. What are the risks? Generally, this is a safe procedure. However, problems may occur, including: Infection. Bleeding. Allergic reactions to medicines. A tear or hole (perforation) in the esophagus, stomach, or duodenum. What happens before the procedure? Staying hydrated Follow instructions from your health care provider about hydration, which may include: Up to 2 hours before the procedure - you may continue to drink clear liquids, such as water, clear fruit juice, black coffee, and plain tea.  Eating and drinking restrictions Follow instructions from your health care provider about eating and drinking, which may include: 8 hours before the procedure - stop eating heavy meals or foods, such as meat, fried foods, or fatty foods. 6 hours before the procedure - stop eating light meals or foods, such as toast or cereal. 6  hours before the procedure - stop drinking milk or drinks that contain milk. 2 hours before the procedure - stop drinking clear liquids. Medicines Ask your health care provider about: Changing or stopping your regular medicines. This is especially important if you are taking diabetes medicines or blood thinners. Taking medicines such as aspirin and ibuprofen. These  medicines can thin your blood. Do not take these medicines unless your health care provider tells you to take them. Taking over-the-counter medicines, vitamins, herbs, and supplements. General instructions Plan to have someone take you home from the hospital or clinic. If you will be going home right after the procedure, plan to have someone with you for 24 hours. Ask your health care provider what steps will be taken to help prevent infection. What happens during the procedure?  An IV will be inserted into one of your veins. You may be given one or more of the following: A medicine to help you relax (sedative). A medicine to numb the throat (local anesthetic). You will lie on your left side on an exam table. Your health care provider will pass the endoscope through your mouth and down your esophagus. Your health care provider will use the scope to check the inside of your esophagus, stomach, and duodenum. Biopsies may be taken. The endoscope will be removed. The procedure may vary among health care providers and hospitals. What happens after the procedure? Your blood pressure, heart rate, breathing rate, and blood oxygen level will be monitored until you leave the hospital or clinic. Do not drive for 24 hours if you were given a sedative during your procedure. When your throat is no longer numb, you may be given some fluids to drink. It is up to you to get the results of your procedure. Ask your health care provider, or the department that is doing the procedure, when your results will be ready. Summary Upper endoscopy is a procedure to look inside the upper GI tract. During the procedure, an IV will be inserted into one of your veins. You may be given a medicine to help you relax. A medicine will be used to numb your throat. The endoscope will be passed through your mouth and down your esophagus. This information is not intended to replace advice given to you by your health care  provider. Make sure you discuss any questions you have with your health care provider. Document Revised: 08/30/2017 Document Reviewed: 08/07/2017 Elsevier Patient Education  [place].

## 2021-02-10 ENCOUNTER — Encounter (HOSPITAL_COMMUNITY): Payer: Self-pay

## 2021-02-10 ENCOUNTER — Encounter (HOSPITAL_COMMUNITY)
Admission: RE | Admit: 2021-02-10 | Discharge: 2021-02-10 | Disposition: A | Discharge status: Home / Self Care | Payer: Medicare Other | Source: Ambulatory Visit | Attending: Gastroenterology | Admitting: Gastroenterology

## 2021-02-10 ENCOUNTER — Other Ambulatory Visit: Payer: Self-pay

## 2021-02-12 ENCOUNTER — Other Ambulatory Visit (INDEPENDENT_AMBULATORY_CARE_PROVIDER_SITE_OTHER): Payer: Self-pay | Admitting: Gastroenterology

## 2021-02-12 ENCOUNTER — Other Ambulatory Visit: Payer: Self-pay

## 2021-02-12 ENCOUNTER — Inpatient Hospital Stay (HOSPITAL_COMMUNITY)
Admission: RE | Admit: 2021-02-12 | Discharge: 2021-02-12 | Disposition: A | Discharge status: Home / Self Care | Payer: Medicare Other | Source: Ambulatory Visit | Attending: Gastroenterology | Admitting: Gastroenterology

## 2021-02-12 ENCOUNTER — Encounter (HOSPITAL_COMMUNITY): Payer: Self-pay

## 2021-02-12 ENCOUNTER — Telehealth (INDEPENDENT_AMBULATORY_CARE_PROVIDER_SITE_OTHER): Payer: Self-pay | Admitting: Gastroenterology

## 2021-02-12 ENCOUNTER — Emergency Department (HOSPITAL_COMMUNITY): Payer: Medicare Other

## 2021-02-12 ENCOUNTER — Inpatient Hospital Stay (HOSPITAL_COMMUNITY)
Admission: EM | Admit: 2021-02-12 | Discharge: 2021-02-14 | DRG: 441 | Disposition: A | Discharge status: Home / Self Care | Payer: Medicare Other | Attending: Internal Medicine | Admitting: Internal Medicine

## 2021-02-12 DIAGNOSIS — I81 Portal vein thrombosis: Secondary | ICD-10-CM | POA: Diagnosis not present

## 2021-02-12 DIAGNOSIS — Z87442 Personal history of urinary calculi: Secondary | ICD-10-CM | POA: Diagnosis not present

## 2021-02-12 DIAGNOSIS — Z8546 Personal history of malignant neoplasm of prostate: Secondary | ICD-10-CM | POA: Diagnosis not present

## 2021-02-12 DIAGNOSIS — K219 Gastro-esophageal reflux disease without esophagitis: Secondary | ICD-10-CM | POA: Diagnosis present

## 2021-02-12 DIAGNOSIS — D6959 Other secondary thrombocytopenia: Secondary | ICD-10-CM | POA: Diagnosis present

## 2021-02-12 DIAGNOSIS — E7800 Pure hypercholesterolemia, unspecified: Secondary | ICD-10-CM | POA: Diagnosis not present

## 2021-02-12 DIAGNOSIS — Z903 Acquired absence of stomach [part of]: Secondary | ICD-10-CM

## 2021-02-12 DIAGNOSIS — I10 Essential (primary) hypertension: Secondary | ICD-10-CM | POA: Diagnosis present

## 2021-02-12 DIAGNOSIS — K449 Diaphragmatic hernia without obstruction or gangrene: Secondary | ICD-10-CM | POA: Diagnosis not present

## 2021-02-12 DIAGNOSIS — K8689 Other specified diseases of pancreas: Secondary | ICD-10-CM | POA: Diagnosis not present

## 2021-02-12 DIAGNOSIS — Z79899 Other long term (current) drug therapy: Secondary | ICD-10-CM | POA: Diagnosis not present

## 2021-02-12 DIAGNOSIS — D689 Coagulation defect, unspecified: Secondary | ICD-10-CM | POA: Diagnosis not present

## 2021-02-12 DIAGNOSIS — J449 Chronic obstructive pulmonary disease, unspecified: Secondary | ICD-10-CM | POA: Diagnosis not present

## 2021-02-12 DIAGNOSIS — K254 Chronic or unspecified gastric ulcer with hemorrhage: Secondary | ICD-10-CM | POA: Diagnosis present

## 2021-02-12 DIAGNOSIS — Z87891 Personal history of nicotine dependence: Secondary | ICD-10-CM | POA: Diagnosis not present

## 2021-02-12 DIAGNOSIS — K319 Disease of stomach and duodenum, unspecified: Secondary | ICD-10-CM | POA: Diagnosis not present

## 2021-02-12 DIAGNOSIS — M19042 Primary osteoarthritis, left hand: Secondary | ICD-10-CM | POA: Diagnosis present

## 2021-02-12 DIAGNOSIS — K7581 Nonalcoholic steatohepatitis (NASH): Secondary | ICD-10-CM | POA: Diagnosis not present

## 2021-02-12 DIAGNOSIS — Z8719 Personal history of other diseases of the digestive system: Secondary | ICD-10-CM

## 2021-02-12 DIAGNOSIS — Z7982 Long term (current) use of aspirin: Secondary | ICD-10-CM

## 2021-02-12 DIAGNOSIS — N179 Acute kidney failure, unspecified: Secondary | ICD-10-CM | POA: Diagnosis not present

## 2021-02-12 DIAGNOSIS — I8510 Secondary esophageal varices without bleeding: Secondary | ICD-10-CM | POA: Diagnosis not present

## 2021-02-12 DIAGNOSIS — Z20822 Contact with and (suspected) exposure to covid-19: Secondary | ICD-10-CM | POA: Diagnosis not present

## 2021-02-12 DIAGNOSIS — Z7984 Long term (current) use of oral hypoglycemic drugs: Secondary | ICD-10-CM

## 2021-02-12 DIAGNOSIS — E039 Hypothyroidism, unspecified: Secondary | ICD-10-CM | POA: Diagnosis not present

## 2021-02-12 DIAGNOSIS — Z8249 Family history of ischemic heart disease and other diseases of the circulatory system: Secondary | ICD-10-CM

## 2021-02-12 DIAGNOSIS — R932 Abnormal findings on diagnostic imaging of liver and biliary tract: Secondary | ICD-10-CM | POA: Insufficient documentation

## 2021-02-12 DIAGNOSIS — Z823 Family history of stroke: Secondary | ICD-10-CM

## 2021-02-12 DIAGNOSIS — K828 Other specified diseases of gallbladder: Secondary | ICD-10-CM | POA: Diagnosis not present

## 2021-02-12 DIAGNOSIS — Z980 Intestinal bypass and anastomosis status: Secondary | ICD-10-CM | POA: Diagnosis not present

## 2021-02-12 DIAGNOSIS — E119 Type 2 diabetes mellitus without complications: Secondary | ICD-10-CM | POA: Diagnosis present

## 2021-02-12 DIAGNOSIS — Z833 Family history of diabetes mellitus: Secondary | ICD-10-CM | POA: Diagnosis not present

## 2021-02-12 DIAGNOSIS — K7469 Other cirrhosis of liver: Secondary | ICD-10-CM | POA: Diagnosis not present

## 2021-02-12 DIAGNOSIS — Z880 Allergy status to penicillin: Secondary | ICD-10-CM

## 2021-02-12 DIAGNOSIS — I700 Atherosclerosis of aorta: Secondary | ICD-10-CM | POA: Diagnosis not present

## 2021-02-12 DIAGNOSIS — Z79890 Hormone replacement therapy (postmenopausal): Secondary | ICD-10-CM

## 2021-02-12 DIAGNOSIS — I4891 Unspecified atrial fibrillation: Secondary | ICD-10-CM | POA: Diagnosis not present

## 2021-02-12 DIAGNOSIS — K7460 Unspecified cirrhosis of liver: Secondary | ICD-10-CM | POA: Diagnosis not present

## 2021-02-12 DIAGNOSIS — N281 Cyst of kidney, acquired: Secondary | ICD-10-CM | POA: Diagnosis not present

## 2021-02-12 DIAGNOSIS — Z825 Family history of asthma and other chronic lower respiratory diseases: Secondary | ICD-10-CM

## 2021-02-12 DIAGNOSIS — M19041 Primary osteoarthritis, right hand: Secondary | ICD-10-CM | POA: Diagnosis present

## 2021-02-12 LAB — COMPREHENSIVE METABOLIC PANEL
ALT: 21 U/L (ref 0–44)
AST: 29 U/L (ref 15–41)
Albumin: 3.1 g/dL — ABNORMAL LOW (ref 3.5–5.0)
Alkaline Phosphatase: 113 U/L (ref 38–126)
Anion gap: 7 (ref 5–15)
BUN: 27 mg/dL — ABNORMAL HIGH (ref 8–23)
CO2: 22 mmol/L (ref 22–32)
Calcium: 8.4 mg/dL — ABNORMAL LOW (ref 8.9–10.3)
Chloride: 107 mmol/L (ref 98–111)
Creatinine, Ser: 1.63 mg/dL — ABNORMAL HIGH (ref 0.61–1.24)
GFR, Estimated: 43 mL/min — ABNORMAL LOW (ref >60)
Glucose, Bld: 225 mg/dL — ABNORMAL HIGH (ref 70–99)
Potassium: 4.7 mmol/L (ref 3.5–5.1)
Sodium: 136 mmol/L (ref 135–145)
Total Bilirubin: 0.7 mg/dL (ref 0.3–1.2)
Total Protein: 7.7 g/dL (ref 6.5–8.1)

## 2021-02-12 LAB — CBC WITH DIFFERENTIAL/PLATELET
Abs Immature Granulocytes: 0 10*3/uL (ref 0.00–0.07)
Basophils Absolute: 0 10*3/uL (ref 0.0–0.1)
Basophils Relative: 0 %
Eosinophils Absolute: 0 10*3/uL (ref 0.0–0.5)
Eosinophils Relative: 0 %
HCT: 30.2 % — ABNORMAL LOW (ref 39.0–52.0)
Hemoglobin: 9.4 g/dL — ABNORMAL LOW (ref 13.0–17.0)
Immature Granulocytes: 0 %
Lymphocytes Relative: 22 %
Lymphs Abs: 0.8 10*3/uL (ref 0.7–4.0)
MCH: 30.7 pg (ref 26.0–34.0)
MCHC: 31.1 g/dL (ref 30.0–36.0)
MCV: 98.7 fL (ref 80.0–100.0)
Monocytes Absolute: 0.5 10*3/uL (ref 0.1–1.0)
Monocytes Relative: 15 %
Neutro Abs: 2.3 10*3/uL (ref 1.7–7.7)
Neutrophils Relative %: 63 %
Platelets: 113 10*3/uL — ABNORMAL LOW (ref 150–400)
RBC: 3.06 MIL/uL — ABNORMAL LOW (ref 4.22–5.81)
RDW: 15.9 % — ABNORMAL HIGH (ref 11.5–15.5)
WBC: 3.6 10*3/uL — ABNORMAL LOW (ref 4.0–10.5)
nRBC: 0 % (ref 0.0–0.2)

## 2021-02-12 LAB — PROTIME-INR
INR: 1.3 — ABNORMAL HIGH (ref 0.8–1.2)
Prothrombin Time: 15.9 seconds — ABNORMAL HIGH (ref 11.4–15.2)

## 2021-02-12 LAB — RESP PANEL BY RT-PCR (FLU A&B, COVID) ARPGX2
Influenza A by PCR: NEGATIVE
Influenza B by PCR: NEGATIVE
SARS Coronavirus 2 by RT PCR: NEGATIVE

## 2021-02-12 LAB — LIPASE, BLOOD: Lipase: 33 U/L (ref 11–51)

## 2021-02-12 LAB — GLUCOSE, CAPILLARY: Glucose-Capillary: 197 mg/dL — ABNORMAL HIGH (ref 70–99)

## 2021-02-12 MED ORDER — IOHEXOL 350 MG/ML SOLN
80.0000 mL | Freq: Once | INTRAVENOUS | Status: AC | PRN
  Administered 2021-02-12: 80 mL via INTRAVENOUS

## 2021-02-12 MED ORDER — CARVEDILOL 3.125 MG PO TABS
6.2500 mg | ORAL_TABLET | Freq: Two times a day (BID) | ORAL | Status: DC
  Administered 2021-02-12 – 2021-02-14 (×4): 6.25 mg via ORAL
  Filled 2021-02-12 (×5): qty 2

## 2021-02-12 MED ORDER — LEVOTHYROXINE SODIUM 137 MCG PO TABS
137.0000 ug | ORAL_TABLET | Freq: Every day | ORAL | Status: DC
Start: 2021-02-13 — End: 2021-02-14
  Administered 2021-02-14: 06:00:00 137 ug via ORAL
  Filled 2021-02-12: qty 1

## 2021-02-12 MED ORDER — METOPROLOL TARTRATE 5 MG/5ML IV SOLN
2.5000 mg | Freq: Once | INTRAVENOUS | Status: AC
Start: 2021-02-12 — End: 2021-02-12
  Administered 2021-02-12: 2.5 mg via INTRAVENOUS
  Filled 2021-02-12: qty 5

## 2021-02-12 MED ORDER — INSULIN ASPART 100 UNIT/ML IJ SOLN: 0.0000 [IU] | Freq: Every day | INTRAMUSCULAR | Status: DC

## 2021-02-12 MED ORDER — INSULIN ASPART 100 UNIT/ML IJ SOLN
0.0000 [IU] | Freq: Three times a day (TID) | INTRAMUSCULAR | Status: DC
  Administered 2021-02-13 (×2): 2 [IU] via SUBCUTANEOUS
  Administered 2021-02-14: 09:00:00 1 [IU] via SUBCUTANEOUS

## 2021-02-12 MED ORDER — GADOBUTROL 1 MMOL/ML IV SOLN
9.0000 mL | Freq: Once | INTRAVENOUS | Status: AC | PRN
  Administered 2021-02-12: 9 mL via INTRAVENOUS

## 2021-02-12 NOTE — Progress Notes (Signed)
   02/12/21 1954  Assess: MEWS Score  Temp 98 F (36.7 C)  BP 99/65  Pulse Rate (!) 106  Resp 18  Level of Consciousness Alert  SpO2 100 %  O2 Device Room Air  Assess: MEWS Score  MEWS Temp 0  MEWS Systolic 1  MEWS Pulse 1  MEWS RR 0  MEWS LOC 0  MEWS Score 2  MEWS Score Color Yellow  Assess: if the MEWS score is Yellow or Red  Were vital signs taken at a resting state? Yes  Focused Assessment No change from prior assessment  Early Detection of Sepsis Score *See Row Information* Medium  MEWS guidelines implemented *See Row Information* No, previously yellow, continue vital signs every 4 hours  Notify: Charge Nurse/RN  Name of Charge Nurse/RN Notified Kristi RN  Date Charge Nurse/RN Notified 02/12/21  Time Charge Nurse/RN Notified 1954

## 2021-02-12 NOTE — Plan of Care (Signed)
  Problem: Education: Goal: Knowledge of General Education information will improve Description Including pain rating scale, medication(s)/side effects and non-pharmacologic comfort measures Outcome: Progressing   Problem: Health Behavior/Discharge Planning: Goal: Ability to manage health-related needs will improve Outcome: Progressing   

## 2021-02-12 NOTE — ED Notes (Signed)
Patient transported to CT 

## 2021-02-12 NOTE — ED Triage Notes (Signed)
Pt sent to ER by Dr Jenetta Downer for abnormal MRI. 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

## 2021-02-12 NOTE — H&P (Signed)
TRH H&P   Patient Demographics:    Connell Bognar, is a 80 y.o. male  MRN: [id number]   DOB - 1940-06-22  Admit Date - 02/12/2021  Outpatient Primary MD for the patient is Sasser, Silvestre Moment, MD  Referring MD/NP/PA: Dr Cyndee Brightly  Outpatient Specialists: Dr Jenetta Downer  Patient coming from: home  GI office  Chief Complaint  Patient presents with   Abnormal MRI      HPI:    Alante Tolan  is a 80 y.o. male, with past medical history of A. fib, not anticoagulation due to history of GI bleed in the past, NASH  cirrhosis, diabetes mellitus, hyperlipidemia, hypertension, hypothyroidism, patient was sent to ED by his GI physician for abnormal MRI, patient is followed by Dr. Jenetta Downer, he had MRI done today which did show portal vein thrombosis, so he was sent to ED to receive CT abdomen pelvis with contrast for further evaluation, patient received CTA abdomen pelvis which did confirm acute/subacute Ortel vein thrombosis, was not present on 01/27/2021 previous imaging, patient currently off Eliquis due to his previous GI bleed, wife confirms that he is only on baby aspirin, patient denies any fever, chills. - in ED CT a abdomen pelvis confirms acute/subacute near occlusive portal vein thrombosis as well his creatinine was noted to be elevated at 1.63, hemoglobin at baseline of 9.4, platelet at baseline at 113 K, [facility] consulted to admit.    Review of systems:    In addition to the HPI above,  No Fever-chills, for generalized weakness and fatigue No Headache, No changes with Vision or hearing, No problems swallowing food or Liquids, No Chest pain, Cough or Shortness of Breath, No Abdominal pain, No Nausea or Vommitting, Bowel movements are regular, does report abdominal distention No Blood in stool or Urine, No dysuria, No new skin rashes or bruises, No new joints  pains-aches,  No new weakness, tingling, numbness in any extremity, No recent weight gain or loss, No polyuria, polydypsia or polyphagia, No significant Mental Stressors.  A full 10 point Review of Systems was done, except as stated above, all other Review of Systems were negative.   With Past History of the following :    Past Medical History:  Diagnosis Date   Arthritis    Hands/Fingers   Back pain    Cirrhosis of liver ([place])    Diabetes mellitus ([place])    Enlarged prostate    Gastric ulcer    GERD (gastroesophageal reflux disease)    H/O bladder problems    Hypercholesteremia    Hypertension    Kidney stone    Prostate cancer ([place])    Thyroid disease    UGI bleed 10/26/2016   Vitamin B12 deficiency 10/26/2016      Past Surgical History:  Procedure Laterality Date   CIRCUMCISION     at age 58   COLONOSCOPY N/A 11/13/2015  Procedure: COLONOSCOPY;  Surgeon: Rogene Houston, MD;  [facility];  Service: Endoscopy;  Laterality: N/A;  2:10   COLONOSCOPY WITH PROPOFOL N/A 06/17/2020   Procedure: COLONOSCOPY WITH PROPOFOL;  Surgeon: Rogene Houston, MD;  [facility];  Service: Endoscopy;  Laterality: N/A;  AM / patient had positive covid 05/25/20   ESOPHAGOGASTRODUODENOSCOPY N/A 06/03/2014   Procedure: ESOPHAGOGASTRODUODENOSCOPY (EGD);  Surgeon: Rogene Houston, MD;  [facility];  Service: Endoscopy;  Laterality: N/A;  730   ESOPHAGOGASTRODUODENOSCOPY N/A 10/26/2016   Procedure: ESOPHAGOGASTRODUODENOSCOPY (EGD);  Surgeon: Rogene Houston, MD;  [facility];  Service: Endoscopy;  Laterality: N/A;   ESOPHAGOGASTRODUODENOSCOPY (EGD) WITH PROPOFOL N/A 06/17/2020   Procedure: ESOPHAGOGASTRODUODENOSCOPY (EGD) WITH PROPOFOL;  Surgeon: Rogene Houston, MD;  [facility];  Service: Endoscopy;  Laterality: N/A;   Gastric Ulcer  1993   GIVENS CAPSULE STUDY N/A 10/28/2016   Procedure: GIVENS CAPSULE STUDY;  Surgeon: Rogene Houston,  MD;  [facility];  Service: Endoscopy;  Laterality: N/A;   HERNIA REPAIR     POLYPECTOMY  11/13/2015   Procedure: POLYPECTOMY;  Surgeon: Rogene Houston, MD;  [facility];  Service: Endoscopy;;  colon   POLYPECTOMY  06/17/2020   Procedure: POLYPECTOMY;  Surgeon: Rogene Houston, MD;  [facility];  Service: Endoscopy;;  sigmoid   Right Elbow Right    A pin was put in      Social History:     Social History   Tobacco Use   Smoking status: Former    Types: Cigarettes    Quit date: 05/25/1989    Years since quitting: 31.7   Smokeless tobacco: Former    Quit date: 01/16/1990  Substance Use Topics   Alcohol use: No    Alcohol/week: 0.0 standard drinks    Comment: Drank many years - 30 years ago       Family History :     Family History  Problem Relation Age of Onset   Emphysema Mother    Cerebrovascular Accident Father    Heart disease Father    Diabetes Sister    Heart disease Brother    Heart disease Sister    Heart disease Brother    Diabetes Brother       Home Medications:   Prior to Admission medications   Medication Sig Start Date End Date Taking? Authorizing Provider  acetaminophen (TYLENOL) 500 MG tablet Take 500 mg by mouth every 6 (six) hours as needed.   Yes [provider]  aspirin EC 81 MG tablet Take 81 mg by mouth daily. Swallow whole.   Yes [provider]  Cranberry 500 MG TABS Take 500 mg by mouth daily.   Yes [provider]  ferrous sulfate 325 (65 FE) MG tablet Take 325 mg by mouth daily with breakfast.   Yes [provider]  furosemide (LASIX) 20 MG tablet Take 1 tablet by mouth once daily Patient taking differently: Take 40 mg by mouth daily. 10/19/20  Yes Branch, Alphonse Guild, MD  glipiZIDE (GLUCOTROL) 10 MG tablet Take 10 mg by mouth 2 (two) times daily before a meal.   Yes [provider]  levothyroxine (SYNTHROID) 150 MCG tablet Take 137 mcg by mouth daily before  breakfast.   Yes [provider]  lisinopril (ZESTRIL) 5 MG tablet Take 1 tablet (5 mg total) by mouth daily. 02/02/21  Yes Branch, Alphonse Guild, MD  magnesium oxide (MAG-OX) 400 MG tablet Take 400 mg by mouth daily.   Yes [provider]  simvastatin (ZOCOR) 40 MG tablet Take 40 mg by mouth at bedtime.    Yes [provider]  spironolactone (ALDACTONE) 50 MG tablet Take 1 tablet (50 mg total) by mouth daily. 05/10/20  Yes Rehman, Mechele Dawley, MD  carvedilol (COREG) 3.125 MG tablet Take 1 tablet (3.125 mg total) by mouth 2 (two) times daily with a meal. Patient not taking: Reported on 02/12/2021 01/28/21   Harvel Quale, MD  dicyclomine (BENTYL) 10 MG capsule Take 1 capsule (10 mg total) by mouth 2 (two) times daily as needed for spasms. Patient not taking: Reported on 02/12/2021 07/14/20   Rogene Houston, MD     Allergies:     Allergies  Allergen Reactions   Penicillins Nausea And Vomiting    Has patient had a PCN reaction causing immediate rash, facial/tongue/throat swelling, SOB or lightheadedness with hypotension: Yes Has patient had a PCN reaction causing severe rash involving mucus membranes or skin necrosis: No Has patient had a PCN reaction that required hospitalization: No Has patient had a PCN reaction occurring within the last 10 years: No If all of the above answers are "NO", then may proceed with Cephalosporin use.   Patient states that it also caused pain in his sides.     Physical Exam:   Vitals  Blood pressure 108/72, pulse 97, temperature 97.9 F (36.6 C), temperature source Oral, resp. rate (!) 24, height 5' 9"  (1.753 m), weight 86.2 kg, SpO2 100 %.   1. General elderly male laying in bed in no apparent distress  2. Normal affect and insight, Not Suicidal or Homicidal, Awake Alert, Oriented X 3.  3. No F.N deficits, ALL C.Nerves Intact, Strength 5/5 all 4 extremities, Sensation intact all 4 extremities, Plantars down  going.  4. Ears and Eyes appear Normal, Conjunctivae clear, PERRLA. Moist Oral Mucosa.  5. Supple Neck, No JVD, No cervical lymphadenopathy appriciated, No Carotid Bruits.  6. Symmetrical Chest wall movement, Good air movement bilaterally, CTAB.  7. irregular irregular, tachycardic, No Gallops, Rubs or Murmurs, No Parasternal Heave.  +1 edema  8. Positive Bowel Sounds, mild ascites, No tenderness, No organomegaly appriciated,No rebound -guarding or rigidity.  9.  No Cyanosis, Normal Skin Turgor, No Skin Rash or Bruise.  10. Good muscle tone,  joints appear normal , no effusions, Normal ROM.  11. No Palpable Lymph Nodes in Neck or Axillae    Data Review:    CBC Recent Labs  Lab 02/12/21 1439  WBC 3.6*  HGB 9.4*  HCT 30.2*  PLT 113*  MCV 98.7  MCH 30.7  MCHC 31.1  RDW 15.9*  LYMPHSABS 0.8  MONOABS 0.5  EOSABS 0.0  BASOSABS 0.0   ------------------------------------------------------------------------------------------------------------------  Chemistries  Recent Labs  Lab 02/12/21 1439  NA 136  K 4.7  CL 107  CO2 22  GLUCOSE 225*  BUN 27*  CREATININE 1.63*  CALCIUM 8.4*  AST 29  ALT 21  ALKPHOS 113  BILITOT 0.7   ------------------------------------------------------------------------------------------------------------------ estimated creatinine clearance is 40 mL/min (A) (by C-G formula based on SCr of 1.63 mg/dL (H)). ------------------------------------------------------------------------------------------------------------------ No results for input(s): TSH, T4TOTAL, T3FREE, THYROIDAB in the last 72 hours.  Invalid input(s): FREET3  Coagulation profile Recent Labs  Lab 02/12/21 1439  INR 1.3*   ------------------------------------------------------------------------------------------------------------------- No results for input(s): DDIMER in the last 72  hours. -------------------------------------------------------------------------------------------------------------------  Cardiac Enzymes No results for input(s): CKMB, TROPONINI,  MYOGLOBIN in the last 168 hours.  Invalid input(s): CK ------------------------------------------------------------------------------------------------------------------ No results found for: BNP   ---------------------------------------------------------------------------------------------------------------  Urinalysis    Component Value Date/Time   COLORURINE YELLOW 10/25/2016 0500   APPEARANCEUR Hazy (A) 11/19/2020 1342   LABSPEC 1.025 10/25/2016 0500   PHURINE 5.5 10/25/2016 0500   GLUCOSEU Negative 11/19/2020 1342   HGBUR NEGATIVE 10/25/2016 0500   BILIRUBINUR Negative 11/19/2020 1342   KETONESUR NEGATIVE 10/25/2016 0500   PROTEINUR Negative 11/19/2020 1342   PROTEINUR NEGATIVE 10/25/2016 0500   NITRITE Negative 11/19/2020 1342   NITRITE NEGATIVE 10/25/2016 0500   LEUKOCYTESUR Negative 11/19/2020 1342    ----------------------------------------------------------------------------------------------------------------   Imaging Results:    MR 3D Recon At Scanner  Result Date: 02/12/2021 CLINICAL DATA:  Right upper quadrant abdominal pain. Recent abnormal abdominal CT and ultrasound with indeterminate gallbladder wall thickening. EXAM: MRI ABDOMEN WITHOUT AND WITH CONTRAST (INCLUDING MRCP) TECHNIQUE: Multiplanar multisequence MR imaging of the abdomen was performed both before and after the administration of intravenous contrast. Dynamic post-contrast sequences were inadvertently obtained in the coronal plane. Heavily T2-weighted images of the biliary and pancreatic ducts were obtained, and three-dimensional MRCP images were rendered by post processing. CONTRAST:  7m GADAVIST GADOBUTROL 1 MMOL/ML IV SOLN COMPARISON:  01/27/2021 right upper quadrant abdominal sonogram and CT abdomen/pelvis.  06/02/2014 MRI abdomen. FINDINGS: Motion degraded scan, limiting assessment. Lower chest: No acute abnormality at the lung bases. Hepatobiliary: Diffusely mildly irregular liver surface, compatible with cirrhosis. No hepatic steatosis. No liver mass. No gallstones. Gallbladder is nondistended. Mild diffuse gallbladder wall thickening with the suggestion of mild pericholecystic varices along the gallbladder wall adjacent to the liver, unchanged from recent CT. No biliary ductal dilatation. Common bile duct diameter 5-6 mm. No choledocholithiasis. No biliary masses, strictures or beading. Pancreas: Generalized mild pancreatic parenchymal and peripancreatic edema, most prominent in the pancreatic head and neck region. No peripancreatic fluid collections. Preserved pancreatic parenchymal enhancement. No pancreatic mass or duct dilation. No pancreas divisum. Spleen: Mild splenomegaly. Craniocaudal splenic length 14.3 cm. No splenic masses. Adrenals/Urinary Tract: No discrete adrenal nodules. No hydronephrosis. Numerous simple bilateral renal cortical cysts, largest 6.2 cm in the posterior lower left kidney. No suspicious renal masses. Stomach/Bowel: Small hiatal hernia. Stable postsurgical changes from distal gastrectomy with gastrojejunostomy. No acute gastric abnormality. Visualized small and large bowel is normal caliber, with no bowel wall thickening. Vascular/Lymphatic: Atherosclerotic nonaneurysmal abdominal aorta. Acute near occlusive thrombosis of the main portal vein extending into the proximal splenic vein, portosplenic venous confluence and adjacent SMV. Retroaortic left renal vein. Patent renal and hepatic veins. Ill-defined high left para-aortic lymphadenopathy measuring up to 2.5 cm short axis diameter (series 25/image 44), not appreciably changed since 05/08/2020 CT abdomen study. Mild aortocaval adenopathy up to 1.2 cm (series 25/image 72), stable since 05/08/2020 CT. Other: No abdominal ascites or focal  fluid collection. Musculoskeletal: No aggressive appearing focal osseous lesions. IMPRESSION: 1. Acute near occlusive thrombosis of the main portal vein extending into the proximal splenic vein, portosplenic venous confluence and adjacent SMV. 2. Generalized mild pancreatic and peripancreatic edema, most prominent in the pancreatic head and neck region, nonspecific, probably secondary to the acute portal vein thrombosis, although acute non-necrotizing pancreatitis is on the differential. No peripancreatic fluid collections. Suggest correlation with serum lipase. 3. Cirrhosis. No liver masses. 4. Mild diffuse gallbladder wall thickening is unchanged and favored to be due to pericholecystic varices. 5. Mild splenomegaly. No ascites. 6. Small hiatal hernia. These results will be called to the ordering clinician or representative  by the Radiologist Assistant, and communication documented in the PACS or [employer]. Electronically Signed   By: Ilona Sorrel M.D.   On: 02/12/2021 11:50   MR ABDOMEN MRCP W WO CONTAST  Result Date: 02/12/2021 CLINICAL DATA:  Right upper quadrant abdominal pain. Recent abnormal abdominal CT and ultrasound with indeterminate gallbladder wall thickening. EXAM: MRI ABDOMEN WITHOUT AND WITH CONTRAST (INCLUDING MRCP) TECHNIQUE: Multiplanar multisequence MR imaging of the abdomen was performed both before and after the administration of intravenous contrast. Dynamic post-contrast sequences were inadvertently obtained in the coronal plane. Heavily T2-weighted images of the biliary and pancreatic ducts were obtained, and three-dimensional MRCP images were rendered by post processing. CONTRAST:  29m GADAVIST GADOBUTROL 1 MMOL/ML IV SOLN COMPARISON:  01/27/2021 right upper quadrant abdominal sonogram and CT abdomen/pelvis. 06/02/2014 MRI abdomen. FINDINGS: Motion degraded scan, limiting assessment. Lower chest: No acute abnormality at the lung bases. Hepatobiliary: Diffusely mildly irregular  liver surface, compatible with cirrhosis. No hepatic steatosis. No liver mass. No gallstones. Gallbladder is nondistended. Mild diffuse gallbladder wall thickening with the suggestion of mild pericholecystic varices along the gallbladder wall adjacent to the liver, unchanged from recent CT. No biliary ductal dilatation. Common bile duct diameter 5-6 mm. No choledocholithiasis. No biliary masses, strictures or beading. Pancreas: Generalized mild pancreatic parenchymal and peripancreatic edema, most prominent in the pancreatic head and neck region. No peripancreatic fluid collections. Preserved pancreatic parenchymal enhancement. No pancreatic mass or duct dilation. No pancreas divisum. Spleen: Mild splenomegaly. Craniocaudal splenic length 14.3 cm. No splenic masses. Adrenals/Urinary Tract: No discrete adrenal nodules. No hydronephrosis. Numerous simple bilateral renal cortical cysts, largest 6.2 cm in the posterior lower left kidney. No suspicious renal masses. Stomach/Bowel: Small hiatal hernia. Stable postsurgical changes from distal gastrectomy with gastrojejunostomy. No acute gastric abnormality. Visualized small and large bowel is normal caliber, with no bowel wall thickening. Vascular/Lymphatic: Atherosclerotic nonaneurysmal abdominal aorta. Acute near occlusive thrombosis of the main portal vein extending into the proximal splenic vein, portosplenic venous confluence and adjacent SMV. Retroaortic left renal vein. Patent renal and hepatic veins. Ill-defined high left para-aortic lymphadenopathy measuring up to 2.5 cm short axis diameter (series 25/image 44), not appreciably changed since 05/08/2020 CT abdomen study. Mild aortocaval adenopathy up to 1.2 cm (series 25/image 72), stable since 05/08/2020 CT. Other: No abdominal ascites or focal fluid collection. Musculoskeletal: No aggressive appearing focal osseous lesions. IMPRESSION: 1. Acute near occlusive thrombosis of the main portal vein extending into the  proximal splenic vein, portosplenic venous confluence and adjacent SMV. 2. Generalized mild pancreatic and peripancreatic edema, most prominent in the pancreatic head and neck region, nonspecific, probably secondary to the acute portal vein thrombosis, although acute non-necrotizing pancreatitis is on the differential. No peripancreatic fluid collections. Suggest correlation with serum lipase. 3. Cirrhosis. No liver masses. 4. Mild diffuse gallbladder wall thickening is unchanged and favored to be due to pericholecystic varices. 5. Mild splenomegaly. No ascites. 6. Small hiatal hernia. These results will be called to the ordering clinician or representative by the Radiologist Assistant, and communication documented in the PACS or [employer] Electronically Signed   By: JIlona SorrelM.D.   On: 02/12/2021 11:50   CT Angio Abd/Pel W and/or Wo Contrast  Result Date: 02/12/2021 CLINICAL DATA:  Acute mesenteric ischemia, diabetes, cirrhosis, hypertension EXAM: CTA ABDOMEN AND PELVIS WITH CONTRAST TECHNIQUE: Multidetector CT imaging of the abdomen and pelvis was performed using the standard protocol during bolus administration of intravenous contrast. Multiplanar reconstructed images and MIPs were obtained and reviewed to  evaluate the vascular anatomy. CONTRAST:  30m OMNIPAQUE IOHEXOL 350 MG/ML SOLN COMPARISON:  MR from earlier the same day, CT 05/08/2020 FINDINGS: VASCULAR Coronary calcifications. Aorta: Moderate calcified plaque. No aneurysm, dissection, or stenosis. Celiac: Calcified ostial plaque with only mild stenosis, patent distally. SMA: Calcified ostial plaque extending over length of 2 cm resulting in mild stenosis, patent distally. Replaced hepatic arterial supply, an anatomic variant. Renals: Single left with calcified ostial plaque extending over length of at least 1.9 cm resulting in at least mild stenosis, patent distally. Single right, with calcified ostial plaque resulting in short segment  stenosis of at least mild severity, patent distally. IMA: Patent without evidence of aneurysm, dissection, vasculitis or significant stenosis. Inflow: Scattered calcified plaque. No aneurysm, dissection, or stenosis. Proximal Outflow: Atheromatous, patent. Veins: Partially occlusive intraluminal thrombus at the portosplenic confluence extending through the length of the main portal vein and into the left portal vein, as was described on earlier MR of the same day, and new since CT 01/27/2021. The splenic vein is patent. Inferior mesenteric vein is patent. Tributaries of the SMV appear patent. Hepatic veins are patent. Moderate esophageal varices. Chronic large left retroperitoneal splenorenal venous shunt. Retroaortic left renal vein, an anatomic variant. Right renal vein patent. Iliac venous system and IVC unremarkable. Review of the MIP images confirms the above findings. NON-VASCULAR Lower chest: No pleural or pericardial effusion. Visualized lung bases clear. Hepatobiliary: Nodular hepatic contour. No focal liver lesion. Symmetric parenchymal enhancement. Subcentimeter hyperdensities in the dependent aspect of the nondistended gallbladder possibly small partially calcified gallstones. Pancreas: Unremarkable. No pancreatic ductal dilatation or surrounding inflammatory changes. Spleen: Splenomegaly, 15.1 cm in length.  No focal lesion. Adrenals/Urinary Tract: Adrenal glands unremarkable. Bilateral renal cysts, largest 6.5 from the mid left kidney. No urolithiasis or hydronephrosis. Urinary bladder is physiologically distended. Stomach/Bowel: Esophageal varices. No significant gastric varices. Gastrojejunostomy and apparent distal partial gastrectomy. Small bowel and colon are nondilated. Lymphatic: Increased number of borderline enlarged left para-aortic and aortocaval lymph nodes as before. Borderline central mesenteric adenopathy. No pelvic adenopathy. Reproductive: Prostate is unremarkable. Other: Persistent  inflammatory/edematous changes in the retroperitoneum posterior to the pancreas. No ascites. No free air. Musculoskeletal: Advanced facet DJD L4-5, allowing grade 1 anterolisthesis. Negative for fracture or worrisome bone lesion. IMPRESSION: 1. Partially occlusive acute/subacute venous thrombus extending from the portosplenic confluence through the length of the main portal vein and into the left portal vein, as seen on earlier MRI, and new since earlier study of 01/27/2021. 2. No new bowel or mesenteric changes to suggest ischemia. 3. Chronic large splenorenal shunt and smaller esophageal varices provide collateral decompression of the mesenteric venous system. 4. Morphologic changes of cirrhosis. 5. Chronic central mesenteric and retroperitoneal adenopathy, nonspecific. 6. Coronary and Aortic Atherosclerosis (ICD10-170.0). Electronically Signed   By: DLucrezia EuropeM.D.   On: 02/12/2021 16:43    My personal review of EKG: Rhythm fib with RVR, Rate 124 /min, QTc 430 , no Acute ST changes   Assessment & Plan:    Principal Problem:   Portal vein thrombosis Active Problems:   HTN (hypertension)   DM (diabetes mellitus) ([facility])   COPD (chronic obstructive pulmonary disease) ([place]  Partially occlusive acute/subacute portal vein thrombosis . -  new since earlier study of 01/27/2021. -Management per GI, ideally would initiate anticoagulation, but given his history of esophageal varices, plan for esophageal banding tomorrow by endoscopy, then he can be considered for full anticoagulation pending further GI recommendation. -We will keep n.p.o. after midnight, meanwhile on  clear liquid diet -Discussed with GI -Patient/wife confirms he is currently off Eliquis, and only on baby aspirin.  NASH liver cirrhosis -We will hold Aldactone and Lasix due to AKI  AKI -Creatinine of 1.63, avoid nephrotoxic medications, will hold Lasix, Aldactone and lisinopril for now  Diabetes mellitus -Hold glipizide and will keep  on insulin sliding scale  Hypothyroidism -Continue with Synthroid  Hypertension-continue to hold lisinopril and Lasix, but I will continue with Coreg at increased dose given esophageal varices and A. fib with RVR.  A. fib with RVR -Heart rate in the 120s, will increase Coreg and keep on as needed with IV metoprolol as well in the setting of esophageal varices -Avoid full anticoagulation for now due to esophageal varices  Thrombocytopenia-due to liver cirrhosis   DVT Prophylaxis  SCDs   AM Labs Ordered, also please review Full Orders  Family Communication: Admission, patients condition and plan of care including tests being ordered have been discussed with the patient and wife at bedside who indicate understanding and agree with the plan and Code Status.  Code Status Full  Likely DC to  home  Condition GUARDED    Consults called: GI    Admission status: inpatient    Time spent in minutes : 60 minutes   Phillips Climes M.D on 02/12/2021 at [place] PM   [facility] - Office  [phone]

## 2021-02-12 NOTE — ED Provider Notes (Signed)
[facility] Provider Note   CSN: [id number] Arrival date & time: 02/12/21  1358     History Chief Complaint  Patient presents with   Abnormal MRI    Tyler Farmer is a 80 y.o. male.  With known history of cirrhosis of the liver diabetes gastroesophageal reflux disease.  Followed by gastroenterology Dr. Jenetta Downer.  Patient had an MRI done today that showed portal vein thrombosis.  He was sent in to have CT scan with contrast abdomen and pelvis.  Screened by daytime emergency physician and they ordered a CTA.  Patient has been feeling fine.  No new complaints.  Patient has a history of atrial fibrillation.  Patient was unclear whether he was still on Eliquis or not.  Right now patient's wife is there and she confirmed that he been taken off of the Eliquis.      Past Medical History:  Diagnosis Date   Arthritis    Hands/Fingers   Back pain    Cirrhosis of liver (HCC)    Diabetes mellitus ([place])    Enlarged prostate    Gastric ulcer    GERD (gastroesophageal reflux disease)    H/O bladder problems    Hypercholesteremia    Hypertension    Kidney stone    Prostate cancer ([place])    Thyroid disease    UGI bleed 10/26/2016   Vitamin B12 deficiency 10/26/2016    Patient Active Problem List   Diagnosis Date Noted   Portal vein thrombosis 02/12/2021   Abnormal CT scan, gallbladder 01/28/2021   RLQ abdominal pain 05/05/2020   UGI bleed 10/26/2016   Vitamin B12 deficiency 10/26/2016   AVM (arteriovenous malformation) 10/25/2016   Gastric ulcer    Symptomatic anemia    Melena 10/24/2016   IDA (iron deficiency anemia) 10/23/2015   Heme positive stool 10/23/2015   Hyperlipidemia 05/27/2014   HTN (hypertension) 05/27/2014   DM (diabetes mellitus) ([place]) 05/27/2014   Hepatic cirrhosis ([place]) 05/27/2014   COPD (chronic obstructive pulmonary disease) ([place]) 05/27/2014    Past Surgical History:  Procedure Laterality Date   CIRCUMCISION     at age 24    COLONOSCOPY N/A 11/13/2015   Procedure: COLONOSCOPY;  Surgeon: Rogene Houston, MD;  [facility];  Service: Endoscopy;  Laterality: N/A;  2:10   COLONOSCOPY WITH PROPOFOL N/A 06/17/2020   Procedure: COLONOSCOPY WITH PROPOFOL;  Surgeon: Rogene Houston, MD;  Location: [facility];  Service: Endoscopy;  Laterality: N/A;  AM / patient had positive covid 05/25/20   ESOPHAGOGASTRODUODENOSCOPY N/A 06/03/2014   Procedure: ESOPHAGOGASTRODUODENOSCOPY (EGD);  Surgeon: Rogene Houston, MD;  [facility];  Service: Endoscopy;  Laterality: N/A;  730   ESOPHAGOGASTRODUODENOSCOPY N/A 10/26/2016   Procedure: ESOPHAGOGASTRODUODENOSCOPY (EGD);  Surgeon: Rogene Houston, MD;  [facility];  Service: Endoscopy;  Laterality: N/A;   ESOPHAGOGASTRODUODENOSCOPY (EGD) WITH PROPOFOL N/A 06/17/2020   Procedure: ESOPHAGOGASTRODUODENOSCOPY (EGD) WITH PROPOFOL;  Surgeon: Rogene Houston, MD;  [facility];  Service: Endoscopy;  Laterality: N/A;   Gastric Ulcer  1993   GIVENS CAPSULE STUDY N/A 10/28/2016   Procedure: GIVENS CAPSULE STUDY;  Surgeon: Rogene Houston, MD;  [facility];  Service: Endoscopy;  Laterality: N/A;   HERNIA REPAIR     POLYPECTOMY  11/13/2015   Procedure: POLYPECTOMY;  Surgeon: Rogene Houston, MD;  [facility];  Service: Endoscopy;;  colon   POLYPECTOMY  06/17/2020   Procedure: POLYPECTOMY;  Surgeon:  Rogene Houston, MD;  [facility];  Service: Endoscopy;;  sigmoid   Right Elbow Right    A pin was put in       Family History  Problem Relation Age of Onset   Emphysema Mother    Cerebrovascular Accident Father    Heart disease Father    Diabetes Sister    Heart disease Brother    Heart disease Sister    Heart disease Brother    Diabetes Brother     Social History   Tobacco Use   Smoking status: Former    Types: Cigarettes    Quit date: 05/25/1989    Years since quitting: 31.7   Smokeless tobacco: Former     Quit date: 01/16/1990  Vaping Use   Vaping Use: Never used  Substance Use Topics   Alcohol use: No    Alcohol/week: 0.0 standard drinks    Comment: Drank many years - 30 years ago   Drug use: No    Home Medications Prior to Admission medications   Medication Sig Start Date End Date Taking? Authorizing Provider  acetaminophen (TYLENOL) 500 MG tablet Take 500 mg by mouth every 6 (six) hours as needed.   Yes [provider]  aspirin EC 81 MG tablet Take 81 mg by mouth daily. Swallow whole.   Yes [provider]  Cranberry 500 MG TABS Take 500 mg by mouth daily.   Yes [provider]  ferrous sulfate 325 (65 FE) MG tablet Take 325 mg by mouth daily with breakfast.   Yes [provider]  furosemide (LASIX) 20 MG tablet Take 1 tablet by mouth once daily Patient taking differently: Take 40 mg by mouth daily. 10/19/20  Yes Branch, Alphonse Guild, MD  glipiZIDE (GLUCOTROL) 10 MG tablet Take 10 mg by mouth 2 (two) times daily before a meal.   Yes [provider]  levothyroxine (SYNTHROID) 150 MCG tablet Take 137 mcg by mouth daily before breakfast.   Yes [provider]  lisinopril (ZESTRIL) 5 MG tablet Take 1 tablet (5 mg total) by mouth daily. 02/02/21  Yes BranchAlphonse Guild, MD  magnesium oxide (MAG-OX) 400 MG tablet Take 400 mg by mouth daily.   Yes [provider]  simvastatin (ZOCOR) 40 MG tablet Take 40 mg by mouth at bedtime.    Yes [provider]  spironolactone (ALDACTONE) 50 MG tablet Take 1 tablet (50 mg total) by mouth daily. 05/10/20  Yes Rehman, Mechele Dawley, MD  carvedilol (COREG) 3.125 MG tablet Take 1 tablet (3.125 mg total) by mouth 2 (two) times daily with a meal. Patient not taking: Reported on 02/12/2021 01/28/21   Harvel Quale, MD  dicyclomine (BENTYL) 10 MG capsule Take 1 capsule (10 mg total) by mouth 2 (two) times daily as needed for spasms. Patient not taking: Reported on 02/12/2021 07/14/20    Rogene Houston, MD    Allergies    Penicillins  Review of Systems   Review of Systems  Constitutional:  Negative for chills and fever.  HENT:  Negative for ear pain and sore throat.   Eyes:  Negative for pain and visual disturbance.  Respiratory:  Negative for cough and shortness of breath.   Cardiovascular:  Negative for chest pain and palpitations.  Gastrointestinal:  Positive for abdominal distention. Negative for abdominal pain and vomiting.  Genitourinary:  Negative for dysuria and hematuria.  Musculoskeletal:  Negative for arthralgias and back pain.  Skin:  Negative for color  change and rash.  Neurological:  Negative for seizures and syncope.  All other systems reviewed and are negative.  Physical Exam Updated Vital Signs BP 117/78   Pulse (!) 109   Temp 97.9 F (36.6 C) (Oral)   Resp 14   Ht 1.753 m (5' 9" )   Wt 86.2 kg   SpO2 100%   BMI 28.06 kg/m   Physical Exam Vitals and nursing note reviewed.  Constitutional:      General: He is not in acute distress.    Appearance: Normal appearance. He is well-developed.  HENT:     Head: Normocephalic and atraumatic.  Eyes:     Extraocular Movements: Extraocular movements intact.     Conjunctiva/sclera: Conjunctivae normal.     Pupils: Pupils are equal, round, and reactive to light.  Cardiovascular:     Rate and Rhythm: Tachycardia present. Rhythm irregular.     Heart sounds: No murmur heard. Pulmonary:     Effort: Pulmonary effort is normal. No respiratory distress.     Breath sounds: Normal breath sounds.  Abdominal:     General: There is distension.     Palpations: Abdomen is soft.     Tenderness: There is no abdominal tenderness.  Musculoskeletal:        General: No swelling. Normal range of motion.     Cervical back: Normal range of motion and neck supple.  Skin:    General: Skin is warm and dry.     Capillary Refill: Capillary refill takes less than 2 seconds.  Neurological:     General: No focal  deficit present.     Mental Status: He is alert. Mental status is at baseline.  Psychiatric:        Mood and Affect: Mood normal.    ED Results / Procedures / Treatments   Labs (all labs ordered are listed, but only abnormal results are displayed) Labs Reviewed  COMPREHENSIVE METABOLIC PANEL - Abnormal; Notable for the following components:      Result Value   Glucose, Bld 225 (*)    BUN 27 (*)    Creatinine, Ser 1.63 (*)    Calcium 8.4 (*)    Albumin 3.1 (*)    GFR, Estimated 43 (*)    All other components within normal limits  CBC WITH DIFFERENTIAL/PLATELET - Abnormal; Notable for the following components:   WBC 3.6 (*)    RBC 3.06 (*)    Hemoglobin 9.4 (*)    HCT 30.2 (*)    RDW 15.9 (*)    Platelets 113 (*)    All other components within normal limits  PROTIME-INR - Abnormal; Notable for the following components:   Prothrombin Time 15.9 (*)    INR 1.3 (*)    All other components within normal limits  RESP PANEL BY RT-PCR (FLU A&B, COVID) ARPGX2  LIPASE, BLOOD    EKG EKG Interpretation  Date/Time:  Friday February 12 2021 14:22:46 EST Ventricular Rate:  124 PR Interval:    QRS Duration: 89 QT Interval:  299 QTC Calculation: 430 R Axis:   53 Text Interpretation: Atrial fibrillation Borderline repolarization abnormality Since last tracing Rate faster and now in Atrial Fibrillation Otherwise no significant change Confirmed by Daleen Bo [phone]) on 02/12/2021 4:07:43 PM  Radiology MR 3D Recon At Scanner  Result Date: 02/12/2021 CLINICAL DATA:  Right upper quadrant abdominal pain. Recent abnormal abdominal CT and ultrasound with indeterminate gallbladder wall thickening. EXAM: MRI ABDOMEN WITHOUT AND WITH CONTRAST (INCLUDING MRCP) TECHNIQUE: Multiplanar  multisequence MR imaging of the abdomen was performed both before and after the administration of intravenous contrast. Dynamic post-contrast sequences were inadvertently obtained in the coronal plane. Heavily  T2-weighted images of the biliary and pancreatic ducts were obtained, and three-dimensional MRCP images were rendered by post processing. CONTRAST:  8m GADAVIST GADOBUTROL 1 MMOL/ML IV SOLN COMPARISON:  01/27/2021 right upper quadrant abdominal sonogram and CT abdomen/pelvis. 06/02/2014 MRI abdomen. FINDINGS: Motion degraded scan, limiting assessment. Lower chest: No acute abnormality at the lung bases. Hepatobiliary: Diffusely mildly irregular liver surface, compatible with cirrhosis. No hepatic steatosis. No liver mass. No gallstones. Gallbladder is nondistended. Mild diffuse gallbladder wall thickening with the suggestion of mild pericholecystic varices along the gallbladder wall adjacent to the liver, unchanged from recent CT. No biliary ductal dilatation. Common bile duct diameter 5-6 mm. No choledocholithiasis. No biliary masses, strictures or beading. Pancreas: Generalized mild pancreatic parenchymal and peripancreatic edema, most prominent in the pancreatic head and neck region. No peripancreatic fluid collections. Preserved pancreatic parenchymal enhancement. No pancreatic mass or duct dilation. No pancreas divisum. Spleen: Mild splenomegaly. Craniocaudal splenic length 14.3 cm. No splenic masses. Adrenals/Urinary Tract: No discrete adrenal nodules. No hydronephrosis. Numerous simple bilateral renal cortical cysts, largest 6.2 cm in the posterior lower left kidney. No suspicious renal masses. Stomach/Bowel: Small hiatal hernia. Stable postsurgical changes from distal gastrectomy with gastrojejunostomy. No acute gastric abnormality. Visualized small and large bowel is normal caliber, with no bowel wall thickening. Vascular/Lymphatic: Atherosclerotic nonaneurysmal abdominal aorta. Acute near occlusive thrombosis of the main portal vein extending into the proximal splenic vein, portosplenic venous confluence and adjacent SMV. Retroaortic left renal vein. Patent renal and hepatic veins. Ill-defined high left  para-aortic lymphadenopathy measuring up to 2.5 cm short axis diameter (series 25/image 44), not appreciably changed since 05/08/2020 CT abdomen study. Mild aortocaval adenopathy up to 1.2 cm (series 25/image 72), stable since 05/08/2020 CT. Other: No abdominal ascites or focal fluid collection. Musculoskeletal: No aggressive appearing focal osseous lesions. IMPRESSION: 1. Acute near occlusive thrombosis of the main portal vein extending into the proximal splenic vein, portosplenic venous confluence and adjacent SMV. 2. Generalized mild pancreatic and peripancreatic edema, most prominent in the pancreatic head and neck region, nonspecific, probably secondary to the acute portal vein thrombosis, although acute non-necrotizing pancreatitis is on the differential. No peripancreatic fluid collections. Suggest correlation with serum lipase. 3. Cirrhosis. No liver masses. 4. Mild diffuse gallbladder wall thickening is unchanged and favored to be due to pericholecystic varices. 5. Mild splenomegaly. No ascites. 6. Small hiatal hernia. These results will be called to the ordering clinician or representative by the Radiologist Assistant, and communication documented in the PACS or [employer] Electronically Signed   By: JIlona SorrelM.D.   On: 02/12/2021 11:50   MR ABDOMEN MRCP W WO CONTAST  Result Date: 02/12/2021 CLINICAL DATA:  Right upper quadrant abdominal pain. Recent abnormal abdominal CT and ultrasound with indeterminate gallbladder wall thickening. EXAM: MRI ABDOMEN WITHOUT AND WITH CONTRAST (INCLUDING MRCP) TECHNIQUE: Multiplanar multisequence MR imaging of the abdomen was performed both before and after the administration of intravenous contrast. Dynamic post-contrast sequences were inadvertently obtained in the coronal plane. Heavily T2-weighted images of the biliary and pancreatic ducts were obtained, and three-dimensional MRCP images were rendered by post processing. CONTRAST:  969mGADAVIST  GADOBUTROL 1 MMOL/ML IV SOLN COMPARISON:  01/27/2021 right upper quadrant abdominal sonogram and CT abdomen/pelvis. 06/02/2014 MRI abdomen. FINDINGS: Motion degraded scan, limiting assessment. Lower chest: No acute abnormality at the lung bases. Hepatobiliary:  Diffusely mildly irregular liver surface, compatible with cirrhosis. No hepatic steatosis. No liver mass. No gallstones. Gallbladder is nondistended. Mild diffuse gallbladder wall thickening with the suggestion of mild pericholecystic varices along the gallbladder wall adjacent to the liver, unchanged from recent CT. No biliary ductal dilatation. Common bile duct diameter 5-6 mm. No choledocholithiasis. No biliary masses, strictures or beading. Pancreas: Generalized mild pancreatic parenchymal and peripancreatic edema, most prominent in the pancreatic head and neck region. No peripancreatic fluid collections. Preserved pancreatic parenchymal enhancement. No pancreatic mass or duct dilation. No pancreas divisum. Spleen: Mild splenomegaly. Craniocaudal splenic length 14.3 cm. No splenic masses. Adrenals/Urinary Tract: No discrete adrenal nodules. No hydronephrosis. Numerous simple bilateral renal cortical cysts, largest 6.2 cm in the posterior lower left kidney. No suspicious renal masses. Stomach/Bowel: Small hiatal hernia. Stable postsurgical changes from distal gastrectomy with gastrojejunostomy. No acute gastric abnormality. Visualized small and large bowel is normal caliber, with no bowel wall thickening. Vascular/Lymphatic: Atherosclerotic nonaneurysmal abdominal aorta. Acute near occlusive thrombosis of the main portal vein extending into the proximal splenic vein, portosplenic venous confluence and adjacent SMV. Retroaortic left renal vein. Patent renal and hepatic veins. Ill-defined high left para-aortic lymphadenopathy measuring up to 2.5 cm short axis diameter (series 25/image 44), not appreciably changed since 05/08/2020 CT abdomen study. Mild  aortocaval adenopathy up to 1.2 cm (series 25/image 72), stable since 05/08/2020 CT. Other: No abdominal ascites or focal fluid collection. Musculoskeletal: No aggressive appearing focal osseous lesions. IMPRESSION: 1. Acute near occlusive thrombosis of the main portal vein extending into the proximal splenic vein, portosplenic venous confluence and adjacent SMV. 2. Generalized mild pancreatic and peripancreatic edema, most prominent in the pancreatic head and neck region, nonspecific, probably secondary to the acute portal vein thrombosis, although acute non-necrotizing pancreatitis is on the differential. No peripancreatic fluid collections. Suggest correlation with serum lipase. 3. Cirrhosis. No liver masses. 4. Mild diffuse gallbladder wall thickening is unchanged and favored to be due to pericholecystic varices. 5. Mild splenomegaly. No ascites. 6. Small hiatal hernia. These results will be called to the ordering clinician or representative by the Radiologist Assistant, and communication documented in the PACS or [employer]. Electronically Signed   By: Ilona Sorrel M.D.   On: 02/12/2021 11:50   CT Angio Abd/Pel W and/or Wo Contrast  Result Date: 02/12/2021 CLINICAL DATA:  Acute mesenteric ischemia, diabetes, cirrhosis, hypertension EXAM: CTA ABDOMEN AND PELVIS WITH CONTRAST TECHNIQUE: Multidetector CT imaging of the abdomen and pelvis was performed using the standard protocol during bolus administration of intravenous contrast. Multiplanar reconstructed images and MIPs were obtained and reviewed to evaluate the vascular anatomy. CONTRAST:  52m OMNIPAQUE IOHEXOL 350 MG/ML SOLN COMPARISON:  MR from earlier the same day, CT 05/08/2020 FINDINGS: VASCULAR Coronary calcifications. Aorta: Moderate calcified plaque. No aneurysm, dissection, or stenosis. Celiac: Calcified ostial plaque with only mild stenosis, patent distally. SMA: Calcified ostial plaque extending over length of 2 cm resulting in mild  stenosis, patent distally. Replaced hepatic arterial supply, an anatomic variant. Renals: Single left with calcified ostial plaque extending over length of at least 1.9 cm resulting in at least mild stenosis, patent distally. Single right, with calcified ostial plaque resulting in short segment stenosis of at least mild severity, patent distally. IMA: Patent without evidence of aneurysm, dissection, vasculitis or significant stenosis. Inflow: Scattered calcified plaque. No aneurysm, dissection, or stenosis. Proximal Outflow: Atheromatous, patent. Veins: Partially occlusive intraluminal thrombus at the portosplenic confluence extending through the length of the main portal vein and into the left  portal vein, as was described on earlier MR of the same day, and new since CT 01/27/2021. The splenic vein is patent. Inferior mesenteric vein is patent. Tributaries of the SMV appear patent. Hepatic veins are patent. Moderate esophageal varices. Chronic large left retroperitoneal splenorenal venous shunt. Retroaortic left renal vein, an anatomic variant. Right renal vein patent. Iliac venous system and IVC unremarkable. Review of the MIP images confirms the above findings. NON-VASCULAR Lower chest: No pleural or pericardial effusion. Visualized lung bases clear. Hepatobiliary: Nodular hepatic contour. No focal liver lesion. Symmetric parenchymal enhancement. Subcentimeter hyperdensities in the dependent aspect of the nondistended gallbladder possibly small partially calcified gallstones. Pancreas: Unremarkable. No pancreatic ductal dilatation or surrounding inflammatory changes. Spleen: Splenomegaly, 15.1 cm in length.  No focal lesion. Adrenals/Urinary Tract: Adrenal glands unremarkable. Bilateral renal cysts, largest 6.5 from the mid left kidney. No urolithiasis or hydronephrosis. Urinary bladder is physiologically distended. Stomach/Bowel: Esophageal varices. No significant gastric varices. Gastrojejunostomy and apparent  distal partial gastrectomy. Small bowel and colon are nondilated. Lymphatic: Increased number of borderline enlarged left para-aortic and aortocaval lymph nodes as before. Borderline central mesenteric adenopathy. No pelvic adenopathy. Reproductive: Prostate is unremarkable. Other: Persistent inflammatory/edematous changes in the retroperitoneum posterior to the pancreas. No ascites. No free air. Musculoskeletal: Advanced facet DJD L4-5, allowing grade 1 anterolisthesis. Negative for fracture or worrisome bone lesion. IMPRESSION: 1. Partially occlusive acute/subacute venous thrombus extending from the portosplenic confluence through the length of the main portal vein and into the left portal vein, as seen on earlier MRI, and new since earlier study of 01/27/2021. 2. No new bowel or mesenteric changes to suggest ischemia. 3. Chronic large splenorenal shunt and smaller esophageal varices provide collateral decompression of the mesenteric venous system. 4. Morphologic changes of cirrhosis. 5. Chronic central mesenteric and retroperitoneal adenopathy, nonspecific. 6. Coronary and Aortic Atherosclerosis (ICD10-170.0). Electronically Signed   By: Lucrezia Europe M.D.   On: 02/12/2021 16:43    Procedures Procedures   Medications Ordered in ED Medications  carvedilol (COREG) tablet 6.25 mg (6.25 mg Oral Given 02/12/21 1758)  iohexol (OMNIPAQUE) 350 MG/ML injection 80 mL (80 mLs Intravenous Contrast Given 02/12/21 1554)  metoprolol tartrate (LOPRESSOR) injection 2.5 mg (2.5 mg Intravenous Given 02/12/21 1758)    ED Course  I have reviewed the triage vital signs and the nursing notes.  Pertinent labs & imaging results that were available during my care of the patient were reviewed by me and considered in my medical decision making (see chart for details).    MDM Rules/Calculators/A&P                           Cardiac monitor shows patient is in atrial fibrillation with heart rate around 120.  Patient's wife  was able to confirm he is not on Eliquis.  He had been taken off of that.  CTA abdomen pelvis ordered confirms with MRI showed.  Went over the results with his gastroenterologist.  He wants him admitted and he is planning to do an upper endoscopy on Saturday if he is not on Eliquis.  Also recommended just going up on his Coreg to help control his atrial fibrillation.  Also her gastroenterologist will discuss with interventional radiology to see if they want to do anything additional regarding the portal vein thrombosis.  Patient's labs without any significant abnormalities.  White blood cell count 3.6.  Hemoglobin 9.4 not significantly changed from baseline.  Renal function GFR fortunately came in today  of 43 so he could have the CT with contrast done.  This is actually somewhat improved from his baseline.  His liver function tests are quite normal with a normal total bili.  Alk phos is also normal at 113.  Lipase is normal at 33.  INR is elevated some at 1.3.   Final Clinical Impression(s) / ED Diagnoses Final diagnoses:  Atrial fibrillation with RVR ([place])  Portal vein thrombosis    Rx / DC Orders ED Discharge Orders     None        Fredia Sorrow, MD 02/12/21 1806

## 2021-02-12 NOTE — Telephone Encounter (Signed)
I received a call from radiology with the results of the most recent MRCP  performed for evaluation of gallbladder thickening.  The gallbladder showed mild diffuse thickening suggestive of pericholecystic varices but unchanged compared to prior CT however there were no features concerning for malignancy and biliary ducts look normal with a CBD of 5 mm.  However, the patient was found to have an acute near occlusive thrombosis in the main portal vein which extended into the proximal splenic vein, portal splenic venous confluence and SMV, there was also presence of mild peripancreatic edema.  There were changes consistent with his known liver cirrhosis but no liver masses.  I discussed his case with Dr. Jorje Guild from interventional radiology today who agreed that the MRI showed these findings.  However, he considered that it would be important to have more consistent evaluation with a repeat CT of the abdomen and pelvis with IV contrast.  We both agreed that the patient should have an evaluation in the ER with this test stat and checking INR and LFTs.  I explained to Dr. Pascal Lux that he had a history of grade 2 esophageal varices that have not been banded in the past (he was actually scheduled to have this banded in 2 weeks).  The next step in his his care based on the CT scan findings will be a potential esophagogastroduodenospy if hospitalized.  I will reach Dr. Pascal Lux with the findings of the CT and we will determine the next steps in the patient's care.  I was able to reach the patient's wife to let her know that she needs to bring him as soon as possible to the ER which she understood and agreed.  Unfortunately I was not able to reach the patient regarding this.

## 2021-02-12 NOTE — ED Provider Notes (Signed)
Emergency Medicine Provider Triage Evaluation Note  Tyler Farmer , a 80 y.o. male  was evaluated in triage.  Pt complains of needing additional testing.  I talked to Dr. Jenetta Downer, from GI, who sent the patient here for CT of the abdomen pelvis to follow-up on abnormal MRI revealing vascular thrombus in the abdomen.  The patient is not anticoagulated.  He has gallbladder disease that was being followed up on today with MRI that showed extensive thrombus in the abdomen; portal vein, splenic vein, SMV.  Chronic liver abnormality changes were visualized.  Review of Systems  Positive: Gallbladder disease Negative: No abdominal pain, no vomiting, no fever  Physical Exam  BP 136/71 (BP Location: Right Arm)   Pulse (!) 105   Temp 97.9 F (36.6 C) (Oral)   Resp 20   Ht 5' 9"  (1.753 m)   Wt 86.2 kg   SpO2 93%   BMI 28.06 kg/m  Gen:   Awake, no distress   Resp:  Normal effort MSK:   Moves extremities without difficulty, no peripheral edema Other:  Abdomen distended, nontender to palpation  Medical Decision Making  Medically screening exam initiated at 2:45 PM.  Appropriate orders placed.  Tyler Farmer was informed that the remainder of the evaluation will be completed by another provider, this initial triage assessment does not replace that evaluation, and the importance of remaining in the ED until their evaluation is complete.   [facility] Provider Note   CSN: [id number] Arrival date & time: 02/12/21  1358     History Chief Complaint  Patient presents with   Abnormal MRI    Tyler Farmer is a 80 y.o. male.  HPI     Past Medical History:  Diagnosis Date   Arthritis    Hands/Fingers   Back pain    Cirrhosis of liver (HCC)    Diabetes mellitus ([place])    Enlarged prostate    Gastric ulcer    GERD (gastroesophageal reflux disease)    H/O bladder problems    Hypercholesteremia    Hypertension    Kidney stone    Prostate cancer ([place])     Thyroid disease    UGI bleed 10/26/2016   Vitamin B12 deficiency 10/26/2016    Patient Active Problem List   Diagnosis Date Noted   Abnormal CT scan, gallbladder 01/28/2021   RLQ abdominal pain 05/05/2020   UGI bleed 10/26/2016   Vitamin B12 deficiency 10/26/2016   AVM (arteriovenous malformation) 10/25/2016   Gastric ulcer    Symptomatic anemia    Melena 10/24/2016   IDA (iron deficiency anemia) 10/23/2015   Heme positive stool 10/23/2015   Hyperlipidemia 05/27/2014   HTN (hypertension) 05/27/2014   DM (diabetes mellitus) ([place]) 05/27/2014   Hepatic cirrhosis ([place]) 05/27/2014   COPD (chronic obstructive pulmonary disease) ([place]) 05/27/2014    Past Surgical History:  Procedure Laterality Date   CIRCUMCISION     at age 31   COLONOSCOPY N/A 11/13/2015   Procedure: COLONOSCOPY;  Surgeon: Rogene Houston, MD;  [facility];  Service: Endoscopy;  Laterality: N/A;  2:10   COLONOSCOPY WITH PROPOFOL N/A 06/17/2020   Procedure: COLONOSCOPY WITH PROPOFOL;  Surgeon: Rogene Houston, MD;  [facility];  Service: Endoscopy;  Laterality: N/A;  AM / patient had positive covid 05/25/20   ESOPHAGOGASTRODUODENOSCOPY N/A 06/03/2014   Procedure: ESOPHAGOGASTRODUODENOSCOPY (EGD);  Surgeon: Rogene Houston, MD;  [facility];  Service: Endoscopy;  Laterality: N/A;  730   ESOPHAGOGASTRODUODENOSCOPY N/A 10/26/2016   Procedure: ESOPHAGOGASTRODUODENOSCOPY (EGD);  Surgeon: Rogene Houston, MD;  [facility];  Service: Endoscopy;  Laterality: N/A;   ESOPHAGOGASTRODUODENOSCOPY (EGD) WITH PROPOFOL N/A 06/17/2020   Procedure: ESOPHAGOGASTRODUODENOSCOPY (EGD) WITH PROPOFOL;  Surgeon: Rogene Houston, MD;  [facility];  Service: Endoscopy;  Laterality: N/A;   Gastric Ulcer  1993   GIVENS CAPSULE STUDY N/A 10/28/2016   Procedure: GIVENS CAPSULE STUDY;  Surgeon: Rogene Houston, MD;  [facility];  Service: Endoscopy;  Laterality: N/A;   HERNIA  REPAIR     POLYPECTOMY  11/13/2015   Procedure: POLYPECTOMY;  Surgeon: Rogene Houston, MD;  [facility];  Service: Endoscopy;;  colon   POLYPECTOMY  06/17/2020   Procedure: POLYPECTOMY;  Surgeon: Rogene Houston, MD;  [facility];  Service: Endoscopy;;  sigmoid   Right Elbow Right    A pin was put in       Family History  Problem Relation Age of Onset   Emphysema Mother    Cerebrovascular Accident Father    Heart disease Father    Diabetes Sister    Heart disease Brother    Heart disease Sister    Heart disease Brother    Diabetes Brother     Social History   Tobacco Use   Smoking status: Former    Types: Cigarettes    Quit date: 05/25/1989    Years since quitting: 31.7   Smokeless tobacco: Former    Quit date: 01/16/1990  Vaping Use   Vaping Use: Never used  Substance Use Topics   Alcohol use: No    Alcohol/week: 0.0 standard drinks    Comment: Drank many years - 30 years ago   Drug use: No    Home Medications Prior to Admission medications   Medication Sig Start Date End Date Taking? Authorizing Provider  acetaminophen (TYLENOL) 500 MG tablet Take 500 mg by mouth every 6 (six) hours as needed.    [provider]  aspirin EC 81 MG tablet Take 81 mg by mouth daily. Swallow whole.    [provider]  carvedilol (COREG) 3.125 MG tablet Take 1 tablet (3.125 mg total) by mouth 2 (two) times daily with a meal. 01/28/21   Montez Morita, Daniel, MD  Cranberry 500 MG TABS Take 500 mg by mouth daily.    [provider]  dicyclomine (BENTYL) 10 MG capsule Take 1 capsule (10 mg total) by mouth 2 (two) times daily as needed for spasms. 07/14/20   Rogene Houston, MD  ferrous sulfate 325 (65 FE) MG tablet Take 325 mg by mouth daily with breakfast.    [provider]  furosemide (LASIX) 20 MG tablet Take 1 tablet by mouth once daily Patient taking differently: Take 20 mg by mouth daily. 10/19/20   Arnoldo Lenis, MD   glipiZIDE (GLUCOTROL) 10 MG tablet Take 10 mg by mouth 2 (two) times daily before a meal.    [provider]  levothyroxine (SYNTHROID) 150 MCG tablet Take 137 mcg by mouth daily before breakfast.    [provider]  lisinopril (ZESTRIL) 5 MG tablet Take 1 tablet (5 mg total) by mouth daily. 02/02/21   Arnoldo Lenis, MD  magnesium oxide (MAG-OX) 400 MG tablet Take 400 mg by mouth daily.    [provider]  simvastatin (ZOCOR) 40 MG tablet Take 40 mg by mouth at bedtime.  [provider]  spironolactone (ALDACTONE) 50 MG tablet Take 1 tablet (50 mg total) by mouth daily. 05/10/20   Rogene Houston, MD    Allergies    Penicillins  Review of Systems   Review of Systems  Physical Exam Updated Vital Signs BP 136/71 (BP Location: Right Arm)   Pulse (!) 105   Temp 97.9 F (36.6 C) (Oral)   Resp 20   Ht 5' 9"  (1.753 m)   Wt 86.2 kg   SpO2 93%   BMI 28.06 kg/m   Physical Exam  ED Results / Procedures / Treatments   Labs (all labs ordered are listed, but only abnormal results are displayed) Labs Reviewed - No data to display  EKG None  Radiology MR 3D Recon At Scanner  Result Date: 02/12/2021 CLINICAL DATA:  Right upper quadrant abdominal pain. Recent abnormal abdominal CT and ultrasound with indeterminate gallbladder wall thickening. EXAM: MRI ABDOMEN WITHOUT AND WITH CONTRAST (INCLUDING MRCP) TECHNIQUE: Multiplanar multisequence MR imaging of the abdomen was performed both before and after the administration of intravenous contrast. Dynamic post-contrast sequences were inadvertently obtained in the coronal plane. Heavily T2-weighted images of the biliary and pancreatic ducts were obtained, and three-dimensional MRCP images were rendered by post processing. CONTRAST:  48m GADAVIST GADOBUTROL 1 MMOL/ML IV SOLN COMPARISON:  01/27/2021 right upper quadrant abdominal sonogram and CT abdomen/pelvis. 06/02/2014 MRI abdomen. FINDINGS: Motion  degraded scan, limiting assessment. Lower chest: No acute abnormality at the lung bases. Hepatobiliary: Diffusely mildly irregular liver surface, compatible with cirrhosis. No hepatic steatosis. No liver mass. No gallstones. Gallbladder is nondistended. Mild diffuse gallbladder wall thickening with the suggestion of mild pericholecystic varices along the gallbladder wall adjacent to the liver, unchanged from recent CT. No biliary ductal dilatation. Common bile duct diameter 5-6 mm. No choledocholithiasis. No biliary masses, strictures or beading. Pancreas: Generalized mild pancreatic parenchymal and peripancreatic edema, most prominent in the pancreatic head and neck region. No peripancreatic fluid collections. Preserved pancreatic parenchymal enhancement. No pancreatic mass or duct dilation. No pancreas divisum. Spleen: Mild splenomegaly. Craniocaudal splenic length 14.3 cm. No splenic masses. Adrenals/Urinary Tract: No discrete adrenal nodules. No hydronephrosis. Numerous simple bilateral renal cortical cysts, largest 6.2 cm in the posterior lower left kidney. No suspicious renal masses. Stomach/Bowel: Small hiatal hernia. Stable postsurgical changes from distal gastrectomy with gastrojejunostomy. No acute gastric abnormality. Visualized small and large bowel is normal caliber, with no bowel wall thickening. Vascular/Lymphatic: Atherosclerotic nonaneurysmal abdominal aorta. Acute near occlusive thrombosis of the main portal vein extending into the proximal splenic vein, portosplenic venous confluence and adjacent SMV. Retroaortic left renal vein. Patent renal and hepatic veins. Ill-defined high left para-aortic lymphadenopathy measuring up to 2.5 cm short axis diameter (series 25/image 44), not appreciably changed since 05/08/2020 CT abdomen study. Mild aortocaval adenopathy up to 1.2 cm (series 25/image 72), stable since 05/08/2020 CT. Other: No abdominal ascites or focal fluid collection. Musculoskeletal: No  aggressive appearing focal osseous lesions. IMPRESSION: 1. Acute near occlusive thrombosis of the main portal vein extending into the proximal splenic vein, portosplenic venous confluence and adjacent SMV. 2. Generalized mild pancreatic and peripancreatic edema, most prominent in the pancreatic head and neck region, nonspecific, probably secondary to the acute portal vein thrombosis, although acute non-necrotizing pancreatitis is on the differential. No peripancreatic fluid collections. Suggest correlation with serum lipase. 3. Cirrhosis. No liver masses. 4. Mild diffuse gallbladder wall thickening is unchanged and favored to be due to pericholecystic varices. 5. Mild splenomegaly. No ascites. 6. Small hiatal  hernia. These results will be called to the ordering clinician or representative by the Radiologist Assistant, and communication documented in the PACS or [employer]. Electronically Signed   By: Ilona Sorrel M.D.   On: 02/12/2021 11:50   MR ABDOMEN MRCP W WO CONTAST  Result Date: 02/12/2021 CLINICAL DATA:  Right upper quadrant abdominal pain. Recent abnormal abdominal CT and ultrasound with indeterminate gallbladder wall thickening. EXAM: MRI ABDOMEN WITHOUT AND WITH CONTRAST (INCLUDING MRCP) TECHNIQUE: Multiplanar multisequence MR imaging of the abdomen was performed both before and after the administration of intravenous contrast. Dynamic post-contrast sequences were inadvertently obtained in the coronal plane. Heavily T2-weighted images of the biliary and pancreatic ducts were obtained, and three-dimensional MRCP images were rendered by post processing. CONTRAST:  75m GADAVIST GADOBUTROL 1 MMOL/ML IV SOLN COMPARISON:  01/27/2021 right upper quadrant abdominal sonogram and CT abdomen/pelvis. 06/02/2014 MRI abdomen. FINDINGS: Motion degraded scan, limiting assessment. Lower chest: No acute abnormality at the lung bases. Hepatobiliary: Diffusely mildly irregular liver surface, compatible with  cirrhosis. No hepatic steatosis. No liver mass. No gallstones. Gallbladder is nondistended. Mild diffuse gallbladder wall thickening with the suggestion of mild pericholecystic varices along the gallbladder wall adjacent to the liver, unchanged from recent CT. No biliary ductal dilatation. Common bile duct diameter 5-6 mm. No choledocholithiasis. No biliary masses, strictures or beading. Pancreas: Generalized mild pancreatic parenchymal and peripancreatic edema, most prominent in the pancreatic head and neck region. No peripancreatic fluid collections. Preserved pancreatic parenchymal enhancement. No pancreatic mass or duct dilation. No pancreas divisum. Spleen: Mild splenomegaly. Craniocaudal splenic length 14.3 cm. No splenic masses. Adrenals/Urinary Tract: No discrete adrenal nodules. No hydronephrosis. Numerous simple bilateral renal cortical cysts, largest 6.2 cm in the posterior lower left kidney. No suspicious renal masses. Stomach/Bowel: Small hiatal hernia. Stable postsurgical changes from distal gastrectomy with gastrojejunostomy. No acute gastric abnormality. Visualized small and large bowel is normal caliber, with no bowel wall thickening. Vascular/Lymphatic: Atherosclerotic nonaneurysmal abdominal aorta. Acute near occlusive thrombosis of the main portal vein extending into the proximal splenic vein, portosplenic venous confluence and adjacent SMV. Retroaortic left renal vein. Patent renal and hepatic veins. Ill-defined high left para-aortic lymphadenopathy measuring up to 2.5 cm short axis diameter (series 25/image 44), not appreciably changed since 05/08/2020 CT abdomen study. Mild aortocaval adenopathy up to 1.2 cm (series 25/image 72), stable since 05/08/2020 CT. Other: No abdominal ascites or focal fluid collection. Musculoskeletal: No aggressive appearing focal osseous lesions. IMPRESSION: 1. Acute near occlusive thrombosis of the main portal vein extending into the proximal splenic vein,  portosplenic venous confluence and adjacent SMV. 2. Generalized mild pancreatic and peripancreatic edema, most prominent in the pancreatic head and neck region, nonspecific, probably secondary to the acute portal vein thrombosis, although acute non-necrotizing pancreatitis is on the differential. No peripancreatic fluid collections. Suggest correlation with serum lipase. 3. Cirrhosis. No liver masses. 4. Mild diffuse gallbladder wall thickening is unchanged and favored to be due to pericholecystic varices. 5. Mild splenomegaly. No ascites. 6. Small hiatal hernia. These results will be called to the ordering clinician or representative by the Radiologist Assistant, and communication documented in the PACS or [employer] Electronically Signed   By: JIlona SorrelM.D.   On: 02/12/2021 11:50     WDaleen Bo MD 02/12/21 [id number]

## 2021-02-13 ENCOUNTER — Encounter (HOSPITAL_COMMUNITY)
Admission: EM | Disposition: A | Discharge status: Home / Self Care | Payer: Self-pay | Source: Home / Self Care | Attending: Internal Medicine

## 2021-02-13 ENCOUNTER — Inpatient Hospital Stay (HOSPITAL_COMMUNITY): Payer: Medicare Other | Admitting: Anesthesiology

## 2021-02-13 DIAGNOSIS — Z980 Intestinal bypass and anastomosis status: Secondary | ICD-10-CM

## 2021-02-13 DIAGNOSIS — I81 Portal vein thrombosis: Secondary | ICD-10-CM | POA: Diagnosis not present

## 2021-02-13 DIAGNOSIS — I8510 Secondary esophageal varices without bleeding: Secondary | ICD-10-CM | POA: Diagnosis not present

## 2021-02-13 DIAGNOSIS — K254 Chronic or unspecified gastric ulcer with hemorrhage: Secondary | ICD-10-CM | POA: Diagnosis not present

## 2021-02-13 HISTORY — PX: ESOPHAGOGASTRODUODENOSCOPY (EGD) WITH PROPOFOL: SHX5813

## 2021-02-13 HISTORY — PX: ESOPHAGEAL BANDING: SHX5518

## 2021-02-13 LAB — TYPE AND SCREEN
ABO/RH(D): O POS
Antibody Screen: NEGATIVE

## 2021-02-13 LAB — CBC
HCT: 28.3 % — ABNORMAL LOW (ref 39.0–52.0)
Hemoglobin: 8.6 g/dL — ABNORMAL LOW (ref 13.0–17.0)
MCH: 29.9 pg (ref 26.0–34.0)
MCHC: 30.4 g/dL (ref 30.0–36.0)
MCV: 98.3 fL (ref 80.0–100.0)
Platelets: 102 10*3/uL — ABNORMAL LOW (ref 150–400)
RBC: 2.88 MIL/uL — ABNORMAL LOW (ref 4.22–5.81)
RDW: 15.7 % — ABNORMAL HIGH (ref 11.5–15.5)
WBC: 3.2 10*3/uL — ABNORMAL LOW (ref 4.0–10.5)
nRBC: 0 % (ref 0.0–0.2)

## 2021-02-13 LAB — COMPREHENSIVE METABOLIC PANEL
ALT: 17 U/L (ref 0–44)
AST: 25 U/L (ref 15–41)
Albumin: 2.6 g/dL — ABNORMAL LOW (ref 3.5–5.0)
Alkaline Phosphatase: 92 U/L (ref 38–126)
Anion gap: 6 (ref 5–15)
BUN: 25 mg/dL — ABNORMAL HIGH (ref 8–23)
CO2: 24 mmol/L (ref 22–32)
Calcium: 8.3 mg/dL — ABNORMAL LOW (ref 8.9–10.3)
Chloride: 108 mmol/L (ref 98–111)
Creatinine, Ser: 1.33 mg/dL — ABNORMAL HIGH (ref 0.61–1.24)
GFR, Estimated: 54 mL/min — ABNORMAL LOW (ref >60)
Glucose, Bld: 144 mg/dL — ABNORMAL HIGH (ref 70–99)
Potassium: 4.8 mmol/L (ref 3.5–5.1)
Sodium: 138 mmol/L (ref 135–145)
Total Bilirubin: 0.7 mg/dL (ref 0.3–1.2)
Total Protein: 6.7 g/dL (ref 6.5–8.1)

## 2021-02-13 LAB — GLUCOSE, CAPILLARY
Glucose-Capillary: 155 mg/dL — ABNORMAL HIGH (ref 70–99)
Glucose-Capillary: 154 mg/dL — ABNORMAL HIGH (ref 70–99)
Glucose-Capillary: 141 mg/dL — ABNORMAL HIGH (ref 70–99)

## 2021-02-13 LAB — PROTIME-INR
INR: 1.4 — ABNORMAL HIGH (ref 0.8–1.2)
Prothrombin Time: 17 seconds — ABNORMAL HIGH (ref 11.4–15.2)

## 2021-02-13 SURGERY — ESOPHAGOGASTRODUODENOSCOPY (EGD) WITH PROPOFOL
Anesthesia: General

## 2021-02-13 MED ORDER — PROPOFOL 500 MG/50ML IV EMUL
INTRAVENOUS | Status: DC | PRN
  Administered 2021-02-13: 150 ug/kg/min via INTRAVENOUS

## 2021-02-13 MED ORDER — LIDOCAINE HCL (PF) 2 % IJ SOLN
INTRAMUSCULAR | Status: AC
  Filled 2021-02-13: qty 5

## 2021-02-13 MED ORDER — PROPOFOL 10 MG/ML IV BOLUS
INTRAVENOUS | Status: AC
  Filled 2021-02-13: qty 40

## 2021-02-13 MED ORDER — PHENYLEPHRINE 40 MCG/ML (10ML) SYRINGE FOR IV PUSH (FOR BLOOD PRESSURE SUPPORT)
PREFILLED_SYRINGE | INTRAVENOUS | Status: DC | PRN
  Administered 2021-02-13: 120 ug via INTRAVENOUS
  Administered 2021-02-13: 80 ug via INTRAVENOUS
  Administered 2021-02-13: 120 ug via INTRAVENOUS
  Administered 2021-02-13 (×2): 80 ug via INTRAVENOUS

## 2021-02-13 MED ORDER — LIDOCAINE VISCOUS HCL 2 % MT SOLN
OROMUCOSAL | Status: AC
  Filled 2021-02-13: qty 15

## 2021-02-13 MED ORDER — LIDOCAINE HCL (CARDIAC) PF 100 MG/5ML IV SOSY
PREFILLED_SYRINGE | INTRAVENOUS | Status: DC | PRN
  Administered 2021-02-13: 50 mg via INTRATRACHEAL

## 2021-02-13 MED ORDER — METOPROLOL TARTRATE 5 MG/5ML IV SOLN
INTRAVENOUS | Status: DC | PRN
  Administered 2021-02-13 (×2): 1 mg via INTRAVENOUS

## 2021-02-13 MED ORDER — PANTOPRAZOLE SODIUM 40 MG PO TBEC
40.0000 mg | DELAYED_RELEASE_TABLET | Freq: Two times a day (BID) | ORAL | Status: DC
  Administered 2021-02-13 – 2021-02-14 (×3): 40 mg via ORAL
  Filled 2021-02-13 (×4): qty 1

## 2021-02-13 MED ORDER — PROPOFOL 10 MG/ML IV BOLUS
INTRAVENOUS | Status: DC | PRN
  Administered 2021-02-13: 50 mg via INTRAVENOUS
  Administered 2021-02-13 (×2): 10 mg via INTRAVENOUS

## 2021-02-13 MED ORDER — LACTATED RINGERS IV SOLN: INTRAVENOUS | Status: DC | PRN

## 2021-02-13 NOTE — Brief Op Note (Signed)
02/12/2021 - 02/13/2021  9:08 AM  PATIENT:  Tyler Farmer  80 y.o. male  PRE-OPERATIVE DIAGNOSIS:  esophageal varices  POST-OPERATIVE DIAGNOSIS:  gastric ulcer;gastroduodenal anastomosis;portal, hypertension;esophageal varices;Grade II Two columns; Grade III One column;  PROCEDURE:  Procedure(s): ESOPHAGOGASTRODUODENOSCOPY (EGD) WITH PROPOFOL (N/A) ESOPHAGEAL BANDING (N/A)  SURGEON:  Surgeon(s) and Role:    * Harvel Quale, MD - Primary  Patient was found to have grade 2 and grade 3 esophageal varices which were banded x3 (upon withdrawal).  There was 1 losing superficial gastric ulcer in the gastric body which measured 4 mm.  2 hemostatic clips were placed achieving hemostasis.  Biopsies from the stomach were obtained to rule out H. pylori.  There was presence of a patent Billroth II gastrojejunostomy which appeared healthy.  Small bowel was within normal limits.  RECOMMENDATIONS: Return patient to hospital ward for ongoing care. - Clear liquid diet today. - Await pathology results. - Repeat upper endoscopy in 4 weeks for surveillance. - Use Protonix (pantoprazole) 40 mg PO BID.  [name], MD Gastroenterology and Hepatology [facility]

## 2021-02-13 NOTE — Op Note (Addendum)
[facility] Patient Name: Tyler Farmer Procedure Date: 02/13/2021 7:34 AM MRN: [id number] Date of Birth: [birth year] Attending MD: [name] ,  CSN: [id number] Age: 80 Admit Type: Outpatient Procedure:                Upper GI endoscopy Indications:              Follow-up of esophageal varices, new portal vein                            thrombosis Providers:                [name], Lurline Del, RN, Casimer Bilis, Technician Referring MD:              Medicines:                Monitored Anesthesia Care Complications:            No immediate complications. Estimated Blood Loss:     Estimated blood loss: none. Procedure:                Pre-Anesthesia Assessment:                           - Prior to the procedure, a History and Physical                            was performed, and patient medications, allergies                            and sensitivities were reviewed. The patient's                            tolerance of previous anesthesia was reviewed.                           - The risks and benefits of the procedure and the                            sedation options and risks were discussed with the                            patient. All questions were answered and informed                            consent was obtained.                           - ASA Grade Assessment: III - A patient with severe                            systemic disease.                           After obtaining informed consent, the endoscope was  passed under direct vision. Throughout the                            procedure, the patient's blood pressure, pulse, and                            oxygen saturations were monitored continuously. The                            [id number]-H190 ([id number]) scope was introduced through the                            mouth, and advanced to the second part of duodenum.                             The upper GI endoscopy was accomplished without                            difficulty. The patient tolerated the procedure                            well. Scope In: 8:17:39 AM Scope Out: 8:38:13 AM Total Procedure Duration: 0 hours 20 minutes 34 seconds  Findings:      Grade III varices were found in the lower third of the esophagus. Three       bands were successfully placed with complete eradication, resulting in       deflation of varices. There was no bleeding at the end of the procedure.      One oozing superficial gastric ulcer with oozing hemorrhage (Forrest       Class Ib) was found in the gastric body. The lesion was 4 mm in largest       dimension. For hemostasis, two hemostatic clips were successfully       placed. There was no bleeding at the end of the procedure. Biopsies of       the healthy stomach were taken with a cold forceps for Helicobacter       pylori testing.      Evidence of a patent Billroth II gastrojejunostomy was found. The       gastrojejunal anastomosis was characterized by healthy appearing mucosa.       This was traversed. The efferent limb was examined. The afferent limb       was examined.      The examined duodenum was normal. Impression:               - Grade III esophageal varices. Completely                            eradicated. Banded.                           - Oozing gastric ulcer with oozing hemorrhage                            (Forrest Class Ib). Clips were placed. Biopsied.                           -  Patent Billroth II gastrojejunostomy was found,                            characterized by healthy appearing mucosa.                           - Normal examined duodenum. Moderate Sedation:      Per Anesthesia Care Recommendation:           - Return patient to hospital ward for ongoing care.                           - Clear liquid diet today.                           - Await pathology results.                           - Repeat upper  endoscopy in 4 weeks for                            surveillance.                           - Use Protonix (pantoprazole) 40 mg PO BID. Procedure Code(s):        --- Professional ---                           [phone], Esophagogastroduodenoscopy, flexible,                            transoral; with band ligation of esophageal/gastric                            varices                           43255, 59, Esophagogastroduodenoscopy, flexible,                            transoral; with control of bleeding, any method                           43239, Esophagogastroduodenoscopy, flexible,                            transoral; with biopsy, single or multiple Diagnosis Code(s):        --- Professional ---                           I85.00, Esophageal varices without bleeding                           K25.4, Chronic or unspecified gastric ulcer with                            hemorrhage  Z98.0, Intestinal bypass and anastomosis status CPT copyright 2019 American Medical Association. All rights reserved. The codes documented in this report are preliminary and upon coder review may  be revised to meet current compliance requirements. [name], MD [name],  02/13/2021 9:08:28 AM This report has been signed electronically. Number of Addenda: 0

## 2021-02-13 NOTE — Progress Notes (Signed)
I discussed the case with Dr. Pascal Lux today.  His case is complex given his multiple comorbidities.  Fortunately he is not presenting any acute decompensating event and his LFTs have remained stable.  We agreed that the best next step for him will be to have a repeat CT of the abdomen and pelvis with IV contrast as outpatient in a month to evaluate if the thrombosis has remained stable or if it has grown in size.  Dr. Pascal Lux stated that he will discuss the case with other interventional radiologists as well to determine if he may be a potential candidate for an intervention by their team if he thromboses were to progress.

## 2021-02-13 NOTE — Progress Notes (Signed)
   02/13/21 0606  Assess: MEWS Score  Temp 98 F (36.7 C)  BP 106/70  Pulse Rate (!) 106  Resp 19  SpO2 99 %  O2 Device Room Air  Assess: MEWS Score  MEWS Temp 0  MEWS Systolic 0  MEWS Pulse 1  MEWS RR 0  MEWS LOC 0  MEWS Score 1  MEWS Score Color Green  Assess: if the MEWS score is Yellow or Red  Were vital signs taken at a resting state? Yes  Focused Assessment No change from prior assessment  Early Detection of Sepsis Score *See Row Information* Medium  MEWS guidelines implemented *See Row Information* No, previously yellow, continue vital signs every 4 hours

## 2021-02-13 NOTE — Progress Notes (Signed)
PROGRESS NOTE    Tyler Farmer  HLK:[id number] DOB: June 29, 1940 DOA: 02/12/2021 PCP: [name], MD   Brief Narrative:   Tyler Farmer  is a 80 y.o. male, with past medical history of A. fib, not anticoagulation due to history of GI bleed in the past, NASH  cirrhosis, diabetes mellitus, hyperlipidemia, hypertension, hypothyroidism, patient was sent to ED by his GI physician for abnormal MRI, patient is followed by Dr. Jenetta Downer, he had MRI done today which did show portal vein thrombosis, so he was sent to ED to receive CT abdomen pelvis with contrast for further evaluation, patient received CTA abdomen pelvis which did confirm acute/subacute portal vein thrombosis, was not present on 01/27/2021 previous imaging, patient currently off Eliquis due to his previous GI bleed, wife confirms that he is only on baby aspirin, patient denies any fever, chills.  He has undergone EGD on 11/26 with noted grade 3 esophageal varices status post banding with clips placed to oozing gastric ulcer.  Assessment & Plan:   Principal Problem:   Portal vein thrombosis Active Problems:   HTN (hypertension)   DM (diabetes mellitus) ([place])   COPD (chronic obstructive pulmonary disease) ([place])   Partially occlusive acute/subacute portal vein thrombosis -Status post EGD 11/26 with banding to grade 3 esophageal varices as well as hemostasis with clips to oozing gastric ulcer -Plan to keep on oral PPI twice daily -Clear liquid diet -Anticipate dietary advancement with potential discharge in a.m. -Monitor CBC for stability  Nash liver cirrhosis -Currently holding Aldactone and Lasix due to AKI  AKI-improving -Creatinine 1.63-1.33 today -Continue to monitor in a.m. -Anticipate resumption of diuretics and lisinopril on discharge  Diabetes mellitus -Keep on SSI  Hypothyroidism -Continue Synthroid  Atrial fibrillation with RVR -Continue telemetry monitoring while on Coreg increased  dose  Thrombocytopenia due to liver cirrhosis -Continue to monitor   DVT prophylaxis: SCDs Code Status: Full Family Communication: Wife, Inez Catalina at bedside 11/26 Disposition Plan:  Status is: Inpatient  Remains inpatient appropriate because: Continues to require ongoing monitoring and IV medications.   Consultants:  GI  Procedures:  ED with banding and clips 11/26  Antimicrobials:  None   Subjective: Patient seen and evaluated today with no new concerns or discomfort noted.  He has tolerated EGD well and denies any abdominal pain.  Objective: Vitals:   02/13/21 0856 02/13/21 0900 02/13/21 0915 02/13/21 0939  BP: (!) 101/57 (!) 97/55 105/67 105/75  Pulse: (!) 111 (!) 111 (!) 113 100  Resp: 17 (!) 24 16 16   Temp:      TempSrc:      SpO2: 99% 99% 99% 100%  Weight:      Height:        Intake/Output Summary (Last 24 hours) at 02/13/2021 1001 Last data filed at 02/13/2021 0854 Gross per 24 hour  Intake 1000 ml  Output --  Net 1000 ml   Filed Weights   02/12/21 1417  Weight: 86.2 kg    Examination:  General exam: Appears calm and comfortable  Respiratory system: Clear to auscultation. Respiratory effort normal. Cardiovascular system: S1 & S2 heard, irregular with systolic murmur. Gastrointestinal system: Abdomen is soft Central nervous system: Alert and awake Extremities: No edema Skin: No significant lesions noted Psychiatry: Flat affect.    Data Reviewed: I have personally reviewed following labs and imaging studies  CBC: Recent Labs  Lab 02/12/21 1439 02/13/21 0442  WBC 3.6* 3.2*  NEUTROABS 2.3  --   HGB 9.4* 8.6*  HCT 30.2*  28.3*  MCV 98.7 98.3  PLT 113* 673*   Basic Metabolic Panel: Recent Labs  Lab 02/12/21 1439 02/13/21 0442  NA 136 138  K 4.7 4.8  CL 107 108  CO2 22 24  GLUCOSE 225* 144*  BUN 27* 25*  CREATININE 1.63* 1.33*  CALCIUM 8.4* 8.3*   GFR: Estimated Creatinine Clearance: 48.2 mL/min (A) (by C-G formula based on  SCr of 1.33 mg/dL (H)). Liver Function Tests: Recent Labs  Lab 02/12/21 1439 02/13/21 0442  AST 29 25  ALT 21 17  ALKPHOS 113 92  BILITOT 0.7 0.7  PROT 7.7 6.7  ALBUMIN 3.1* 2.6*   Recent Labs  Lab 02/12/21 1439  LIPASE 33   No results for input(s): AMMONIA in the last 168 hours. Coagulation Profile: Recent Labs  Lab 02/12/21 1439 02/13/21 0442  INR 1.3* 1.4*   Cardiac Enzymes: No results for input(s): CKTOTAL, CKMB, CKMBINDEX, TROPONINI in the last 168 hours. BNP (last 3 results) No results for input(s): PROBNP in the last 8760 hours. HbA1C: No results for input(s): HGBA1C in the last 72 hours. CBG: Recent Labs  Lab 02/12/21 2132  GLUCAP 197*   Lipid Profile: No results for input(s): CHOL, HDL, LDLCALC, TRIG, CHOLHDL, LDLDIRECT in the last 72 hours. Thyroid Function Tests: No results for input(s): TSH, T4TOTAL, FREET4, T3FREE, THYROIDAB in the last 72 hours. Anemia Panel: No results for input(s): VITAMINB12, FOLATE, FERRITIN, TIBC, IRON, RETICCTPCT in the last 72 hours. Sepsis Labs: No results for input(s): PROCALCITON, LATICACIDVEN in the last 168 hours.  Recent Results (from the past 240 hour(s))  Resp Panel by RT-PCR (Flu A&B, Covid) Nasopharyngeal Swab     Status: None   Collection Time: 02/12/21  3:16 PM   Specimen: Nasopharyngeal Swab; Nasopharyngeal(NP) swabs in vial transport medium  Result Value Ref Range Status   SARS Coronavirus 2 by RT PCR NEGATIVE NEGATIVE Final    Comment: (NOTE) SARS-CoV-2 target nucleic acids are NOT DETECTED.  The SARS-CoV-2 RNA is generally detectable in upper respiratory specimens during the acute phase of infection. The lowest concentration of SARS-CoV-2 viral copies this assay can detect is 138 copies/mL. A negative result does not preclude SARS-Cov-2 infection and should not be used as the sole basis for treatment or other patient management decisions. A negative result may occur with  improper specimen  collection/handling, submission of specimen other than nasopharyngeal swab, presence of viral mutation(s) within the areas targeted by this assay, and inadequate number of viral copies(<138 copies/mL). A negative result must be combined with clinical observations, patient history, and epidemiological information. The expected result is Negative.  Fact Sheet for Patients:  EntrepreneurPulse.com.au  Fact Sheet for Healthcare Providers:  IncredibleEmployment.be  This test is no t yet approved or cleared by the [place] FDA and  has been authorized for detection and/or diagnosis of SARS-CoV-2 by FDA under an Emergency Use Authorization (EUA). This EUA will remain  in effect (meaning this test can be used) for the duration of the COVID-19 declaration under Section 564(b)(1) of the Act, 21 U.S.C.section 360bbb-3(b)(1), unless the authorization is terminated  or revoked sooner.       Influenza A by PCR NEGATIVE NEGATIVE Final   Influenza B by PCR NEGATIVE NEGATIVE Final    Comment: (NOTE) The Xpert Xpress SARS-CoV-2/FLU/RSV plus assay is intended as an aid in the diagnosis of influenza from Nasopharyngeal swab specimens and should not be used as a sole basis for treatment. Nasal washings and aspirates are unacceptable for Xpert Xpress  SARS-CoV-2/FLU/RSV testing.  Fact Sheet for Patients: EntrepreneurPulse.com.au  Fact Sheet for Healthcare Providers: IncredibleEmployment.be  This test is not yet approved or cleared by the [place] FDA and has been authorized for detection and/or diagnosis of SARS-CoV-2 by FDA under an Emergency Use Authorization (EUA). This EUA will remain in effect (meaning this test can be used) for the duration of the COVID-19 declaration under Section 564(b)(1) of the Act, 21 U.S.C. section 360bbb-3(b)(1), unless the authorization is terminated or revoked.  Performed at [facility], [address] [place], [place] [address]          Radiology Studies: MR 3D Recon At Scanner  Result Date: 02/12/2021 CLINICAL DATA:  Right upper quadrant abdominal pain. Recent abnormal abdominal CT and ultrasound with indeterminate gallbladder wall thickening. EXAM: MRI ABDOMEN WITHOUT AND WITH CONTRAST (INCLUDING MRCP) TECHNIQUE: Multiplanar multisequence MR imaging of the abdomen was performed both before and after the administration of intravenous contrast. Dynamic post-contrast sequences were inadvertently obtained in the coronal plane. Heavily T2-weighted images of the biliary and pancreatic ducts were obtained, and three-dimensional MRCP images were rendered by post processing. CONTRAST:  41m GADAVIST GADOBUTROL 1 MMOL/ML IV SOLN COMPARISON:  01/27/2021 right upper quadrant abdominal sonogram and CT abdomen/pelvis. 06/02/2014 MRI abdomen. FINDINGS: Motion degraded scan, limiting assessment. Lower chest: No acute abnormality at the lung bases. Hepatobiliary: Diffusely mildly irregular liver surface, compatible with cirrhosis. No hepatic steatosis. No liver mass. No gallstones. Gallbladder is nondistended. Mild diffuse gallbladder wall thickening with the suggestion of mild pericholecystic varices along the gallbladder wall adjacent to the liver, unchanged from recent CT. No biliary ductal dilatation. Common bile duct diameter 5-6 mm. No choledocholithiasis. No biliary masses, strictures or beading. Pancreas: Generalized mild pancreatic parenchymal and peripancreatic edema, most prominent in the pancreatic head and neck region. No peripancreatic fluid collections. Preserved pancreatic parenchymal enhancement. No pancreatic mass or duct dilation. No pancreas divisum. Spleen: Mild splenomegaly. Craniocaudal splenic length 14.3 cm. No splenic masses. Adrenals/Urinary Tract: No discrete adrenal nodules. No hydronephrosis. Numerous simple bilateral renal cortical cysts, largest 6.2 cm in the  posterior lower left kidney. No suspicious renal masses. Stomach/Bowel: Small hiatal hernia. Stable postsurgical changes from distal gastrectomy with gastrojejunostomy. No acute gastric abnormality. Visualized small and large bowel is normal caliber, with no bowel wall thickening. Vascular/Lymphatic: Atherosclerotic nonaneurysmal abdominal aorta. Acute near occlusive thrombosis of the main portal vein extending into the proximal splenic vein, portosplenic venous confluence and adjacent SMV. Retroaortic left renal vein. Patent renal and hepatic veins. Ill-defined high left para-aortic lymphadenopathy measuring up to 2.5 cm short axis diameter (series 25/image 44), not appreciably changed since 05/08/2020 CT abdomen study. Mild aortocaval adenopathy up to 1.2 cm (series 25/image 72), stable since 05/08/2020 CT. Other: No abdominal ascites or focal fluid collection. Musculoskeletal: No aggressive appearing focal osseous lesions. IMPRESSION: 1. Acute near occlusive thrombosis of the main portal vein extending into the proximal splenic vein, portosplenic venous confluence and adjacent SMV. 2. Generalized mild pancreatic and peripancreatic edema, most prominent in the pancreatic head and neck region, nonspecific, probably secondary to the acute portal vein thrombosis, although acute non-necrotizing pancreatitis is on the differential. No peripancreatic fluid collections. Suggest correlation with serum lipase. 3. Cirrhosis. No liver masses. 4. Mild diffuse gallbladder wall thickening is unchanged and favored to be due to pericholecystic varices. 5. Mild splenomegaly. No ascites. 6. Small hiatal hernia. These results will be called to the ordering clinician or representative by the Radiologist Assistant, and communication documented in the PACS or  [facility] Dashboard. Electronically Signed   By: Ilona Sorrel M.D.   On: 02/12/2021 11:50   MR ABDOMEN MRCP W WO CONTAST  Result Date: 02/12/2021 CLINICAL DATA:  Right upper  quadrant abdominal pain. Recent abnormal abdominal CT and ultrasound with indeterminate gallbladder wall thickening. EXAM: MRI ABDOMEN WITHOUT AND WITH CONTRAST (INCLUDING MRCP) TECHNIQUE: Multiplanar multisequence MR imaging of the abdomen was performed both before and after the administration of intravenous contrast. Dynamic post-contrast sequences were inadvertently obtained in the coronal plane. Heavily T2-weighted images of the biliary and pancreatic ducts were obtained, and three-dimensional MRCP images were rendered by post processing. CONTRAST:  70m GADAVIST GADOBUTROL 1 MMOL/ML IV SOLN COMPARISON:  01/27/2021 right upper quadrant abdominal sonogram and CT abdomen/pelvis. 06/02/2014 MRI abdomen. FINDINGS: Motion degraded scan, limiting assessment. Lower chest: No acute abnormality at the lung bases. Hepatobiliary: Diffusely mildly irregular liver surface, compatible with cirrhosis. No hepatic steatosis. No liver mass. No gallstones. Gallbladder is nondistended. Mild diffuse gallbladder wall thickening with the suggestion of mild pericholecystic varices along the gallbladder wall adjacent to the liver, unchanged from recent CT. No biliary ductal dilatation. Common bile duct diameter 5-6 mm. No choledocholithiasis. No biliary masses, strictures or beading. Pancreas: Generalized mild pancreatic parenchymal and peripancreatic edema, most prominent in the pancreatic head and neck region. No peripancreatic fluid collections. Preserved pancreatic parenchymal enhancement. No pancreatic mass or duct dilation. No pancreas divisum. Spleen: Mild splenomegaly. Craniocaudal splenic length 14.3 cm. No splenic masses. Adrenals/Urinary Tract: No discrete adrenal nodules. No hydronephrosis. Numerous simple bilateral renal cortical cysts, largest 6.2 cm in the posterior lower left kidney. No suspicious renal masses. Stomach/Bowel: Small hiatal hernia. Stable postsurgical changes from distal gastrectomy with gastrojejunostomy.  No acute gastric abnormality. Visualized small and large bowel is normal caliber, with no bowel wall thickening. Vascular/Lymphatic: Atherosclerotic nonaneurysmal abdominal aorta. Acute near occlusive thrombosis of the main portal vein extending into the proximal splenic vein, portosplenic venous confluence and adjacent SMV. Retroaortic left renal vein. Patent renal and hepatic veins. Ill-defined high left para-aortic lymphadenopathy measuring up to 2.5 cm short axis diameter (series 25/image 44), not appreciably changed since 05/08/2020 CT abdomen study. Mild aortocaval adenopathy up to 1.2 cm (series 25/image 72), stable since 05/08/2020 CT. Other: No abdominal ascites or focal fluid collection. Musculoskeletal: No aggressive appearing focal osseous lesions. IMPRESSION: 1. Acute near occlusive thrombosis of the main portal vein extending into the proximal splenic vein, portosplenic venous confluence and adjacent SMV. 2. Generalized mild pancreatic and peripancreatic edema, most prominent in the pancreatic head and neck region, nonspecific, probably secondary to the acute portal vein thrombosis, although acute non-necrotizing pancreatitis is on the differential. No peripancreatic fluid collections. Suggest correlation with serum lipase. 3. Cirrhosis. No liver masses. 4. Mild diffuse gallbladder wall thickening is unchanged and favored to be due to pericholecystic varices. 5. Mild splenomegaly. No ascites. 6. Small hiatal hernia. These results will be called to the ordering clinician or representative by the Radiologist Assistant, and communication documented in the PACS or [employer] Electronically Signed   By: JIlona SorrelM.D.   On: 02/12/2021 11:50   CT Angio Abd/Pel W and/or Wo Contrast  Result Date: 02/12/2021 CLINICAL DATA:  Acute mesenteric ischemia, diabetes, cirrhosis, hypertension EXAM: CTA ABDOMEN AND PELVIS WITH CONTRAST TECHNIQUE: Multidetector CT imaging of the abdomen and pelvis was  performed using the standard protocol during bolus administration of intravenous contrast. Multiplanar reconstructed images and MIPs were obtained and reviewed to evaluate the vascular anatomy. CONTRAST:  867mOMNIPAQUE IOHEXOL 350  MG/ML SOLN COMPARISON:  MR from earlier the same day, CT 05/08/2020 FINDINGS: VASCULAR Coronary calcifications. Aorta: Moderate calcified plaque. No aneurysm, dissection, or stenosis. Celiac: Calcified ostial plaque with only mild stenosis, patent distally. SMA: Calcified ostial plaque extending over length of 2 cm resulting in mild stenosis, patent distally. Replaced hepatic arterial supply, an anatomic variant. Renals: Single left with calcified ostial plaque extending over length of at least 1.9 cm resulting in at least mild stenosis, patent distally. Single right, with calcified ostial plaque resulting in short segment stenosis of at least mild severity, patent distally. IMA: Patent without evidence of aneurysm, dissection, vasculitis or significant stenosis. Inflow: Scattered calcified plaque. No aneurysm, dissection, or stenosis. Proximal Outflow: Atheromatous, patent. Veins: Partially occlusive intraluminal thrombus at the portosplenic confluence extending through the length of the main portal vein and into the left portal vein, as was described on earlier MR of the same day, and new since CT 01/27/2021. The splenic vein is patent. Inferior mesenteric vein is patent. Tributaries of the SMV appear patent. Hepatic veins are patent. Moderate esophageal varices. Chronic large left retroperitoneal splenorenal venous shunt. Retroaortic left renal vein, an anatomic variant. Right renal vein patent. Iliac venous system and IVC unremarkable. Review of the MIP images confirms the above findings. NON-VASCULAR Lower chest: No pleural or pericardial effusion. Visualized lung bases clear. Hepatobiliary: Nodular hepatic contour. No focal liver lesion. Symmetric parenchymal enhancement.  Subcentimeter hyperdensities in the dependent aspect of the nondistended gallbladder possibly small partially calcified gallstones. Pancreas: Unremarkable. No pancreatic ductal dilatation or surrounding inflammatory changes. Spleen: Splenomegaly, 15.1 cm in length.  No focal lesion. Adrenals/Urinary Tract: Adrenal glands unremarkable. Bilateral renal cysts, largest 6.5 from the mid left kidney. No urolithiasis or hydronephrosis. Urinary bladder is physiologically distended. Stomach/Bowel: Esophageal varices. No significant gastric varices. Gastrojejunostomy and apparent distal partial gastrectomy. Small bowel and colon are nondilated. Lymphatic: Increased number of borderline enlarged left para-aortic and aortocaval lymph nodes as before. Borderline central mesenteric adenopathy. No pelvic adenopathy. Reproductive: Prostate is unremarkable. Other: Persistent inflammatory/edematous changes in the retroperitoneum posterior to the pancreas. No ascites. No free air. Musculoskeletal: Advanced facet DJD L4-5, allowing grade 1 anterolisthesis. Negative for fracture or worrisome bone lesion. IMPRESSION: 1. Partially occlusive acute/subacute venous thrombus extending from the portosplenic confluence through the length of the main portal vein and into the left portal vein, as seen on earlier MRI, and new since earlier study of 01/27/2021. 2. No new bowel or mesenteric changes to suggest ischemia. 3. Chronic large splenorenal shunt and smaller esophageal varices provide collateral decompression of the mesenteric venous system. 4. Morphologic changes of cirrhosis. 5. Chronic central mesenteric and retroperitoneal adenopathy, nonspecific. 6. Coronary and Aortic Atherosclerosis (ICD10-170.0). Electronically Signed   By: Lucrezia Europe M.D.   On: 02/12/2021 16:43        Scheduled Meds:  carvedilol  6.25 mg Oral BID WC   insulin aspart  0-5 Units Subcutaneous QHS   insulin aspart  0-9 Units Subcutaneous TID WC   levothyroxine   137 mcg Oral QAC breakfast   pantoprazole  40 mg Oral BID     LOS: 1 day    Time spent: 35 minutes    Zeba Luby Darleen Crocker, DO [facility]  If 7PM-7AM, please contact night-coverage [email or web address] 02/13/2021, 10:01 AM

## 2021-02-13 NOTE — Transfer of Care (Signed)
Immediate Anesthesia Transfer of Care Note  Patient: Tyler Farmer  Procedure(s) Performed: ESOPHAGOGASTRODUODENOSCOPY (EGD) WITH PROPOFOL ESOPHAGEAL BANDING  Patient Location: PACU  Anesthesia Type:General  Level of Consciousness: awake, alert , oriented and sedated  Airway & Oxygen Therapy: Patient Spontanous Breathing and Patient connected to nasal cannula oxygen  Post-op Assessment: Report given to RN and Post -op Vital signs reviewed and stable  Post vital signs: Reviewed and stable  Last Vitals:  Vitals Value Taken Time  BP 95/52 02/13/21 0852  Temp 36.4 C 02/13/21 0852  Pulse 105 02/13/21 0855  Resp 21 02/13/21 0855  SpO2 99 % 02/13/21 0855  Vitals shown include unvalidated device data.  Last Pain:  Vitals:   02/13/21 0801  TempSrc: Oral  PainSc: 0-No pain      Patients Stated Pain Goal: 7 (16/10/96 0454)  Complications: No notable events documented.

## 2021-02-13 NOTE — Anesthesia Preprocedure Evaluation (Signed)
Anesthesia Evaluation  Patient identified by MRN, date of birth, ID band Patient awake    Reviewed: Allergy & Precautions, NPO status , Patient's Chart, lab work & pertinent test results, reviewed documented beta blocker date and time   History of Anesthesia Complications Negative for: history of anesthetic complications  Airway Mallampati: II  TM Distance: >3 FB Neck ROM: Full    Dental  (+) Dental Advisory Given, Missing   Pulmonary COPD,  COPD inhaler, former smoker,    Pulmonary exam normal breath sounds clear to auscultation       Cardiovascular Exercise Tolerance: Good hypertension, Pt. on medications and Pt. on home beta blockers + dysrhythmias Atrial Fibrillation  Rhythm:Irregular Rate:Tachycardia  12-Feb-2021 14:22:46 [facility]-ER ROUTINE RECORD [id number] (79 yr) Male Caucasian Vent. rate 124 BPM PR interval * ms QRS duration 89 ms QT/QTcB 299/430 ms P-R-T axes * 53 -78 Atrial fibrillation Borderline repolarization abnormality Since last tracing Rate faster and now in Atrial Fibrillation Otherwise no significant change Confirmed by Daleen Bo [phone]) on 02/12/2021 4:07:43 PM   Neuro/Psych negative neurological ROS  negative psych ROS   GI/Hepatic PUD, GERD  Medicated and Controlled,(+) Cirrhosis   Esophageal Varices and ascites    , Hepatitis -  Endo/Other  diabetes, Well Controlled, Type 2, Oral Hypoglycemic Agents  Renal/GU Renal InsufficiencyRenal disease     Musculoskeletal  (+) Arthritis  (back pain),   Abdominal   Peds  Hematology  (+) anemia ,   Anesthesia Other Findings Prostate cancer   Reproductive/Obstetrics negative OB ROS                             Anesthesia Physical  Anesthesia Plan  ASA: 4 and emergent  Anesthesia Plan: General   Post-op Pain Management: Minimal or no pain anticipated   Induction: Intravenous  PONV  Risk Score and Plan: Propofol infusion  Airway Management Planned: Nasal Cannula and Natural Airway  Additional Equipment:   Intra-op Plan:   Post-operative Plan: Possible Post-op intubation/ventilation  Informed Consent: I have reviewed the patients History and Physical, chart, labs and discussed the procedure including the risks, benefits and alternatives for the proposed anesthesia with the patient or authorized representative who has indicated his/her understanding and acceptance.     Dental advisory given  Plan Discussed with: Surgeon and CRNA  Anesthesia Plan Comments:         Anesthesia Quick Evaluation

## 2021-02-13 NOTE — Anesthesia Postprocedure Evaluation (Signed)
Anesthesia Post Note  Patient: Tyler Farmer  Procedure(s) Performed: ESOPHAGOGASTRODUODENOSCOPY (EGD) WITH PROPOFOL ESOPHAGEAL BANDING  Patient location during evaluation: PACU Anesthesia Type: General Level of consciousness: awake and alert and oriented Pain management: pain level controlled Vital Signs Assessment: post-procedure vital signs reviewed and stable Respiratory status: spontaneous breathing, nonlabored ventilation and respiratory function stable Cardiovascular status: blood pressure returned to baseline and stable Postop Assessment: no apparent nausea or vomiting Anesthetic complications: no   No notable events documented.   Last Vitals:  Vitals:   02/13/21 0856 02/13/21 0900  BP: (!) 101/57 (!) 97/55  Pulse: (!) 111 (!) 111  Resp: 17 (!) 24  Temp:    SpO2: 99% 99%    Last Pain:  Vitals:   02/13/21 0801  TempSrc: Oral  PainSc: 0-No pain                 Taeler Winning C Aivy Akter

## 2021-02-13 NOTE — Consult Note (Signed)
Brief consult note Note performed prior to performing endoscopic eval  Briefly, this is a 80 y.o. male with past medical history of Tyler Farmer cirrhosis complicated by nonbleeding grade 2 esophageal varices, ascites, history of bleeding gastric AVM, peptic ulcer disease status post Billroth II gastrectomy, atrial fibrillation, diabetes, GERD, hyperlipidemia, hypertension, prostate cancer, who presents to the ER after being found to have acute onset of near occlusive thrombus of the portal vein with extension to the proximal splenic vein, portal splenic venous confluence and SMV on recent MRCP.  The patient denies having any symptoms or complaints at the moment.  Is not taking any anticoagulation or high-dose antiplatelet.  He states feeling fine and denies any melena or hematochezia.  Patient was found to have confirmation of thrombosis in the CT scan that was repeated yesterday.  Labs from today showed stable hemoglobin of 8.6 with white blood cell count 3.2, platelets of 102 and CMP with stable liver function test, creatinine improving down to 1.33, INR was 1.4.  As discussed with interventional radiology and with the patient, we will proceed with an EGD for evaluation esophageal varices and possible banding.  If indeed he has varices, will need band him and he will need to wait until repeat EGD in 4 weeks to determine if he can pursue systemic anticoagulation.  I will discuss his case with interventional radiology.  Finally, his heart rate needs to be well controlled and his Coreg was increased to 6.25 mg twice a day.  We will proceed with EGD as scheduled.  I thoroughly discussed with the patient his procedure, including the risks involved. Patient understands what the procedure involves including the benefits and any risks. Patient understands alternatives to the proposed procedure. Risks including (but not limited to) bleeding, tearing of the lining (perforation), rupture of adjacent organs, problems with  heart and lung function, infection, and medication reactions. A small percentage of complications may require surgery, hospitalization, repeat endoscopic procedure, and/or transfusion.  Patient understood and agreed.  [name], MD Gastroenterology and Hepatology [facility]

## 2021-02-13 NOTE — Consult Note (Addendum)
[name], M.D. Gastroenterology & Hepatology                                           Patient Name: Tyler Farmer Account #: @FLAACCTNO @   MRN: [id number] Admission Date: 02/12/2021 Date of Evaluation:  02/13/2021 Time of Evaluation: 8:43 AM   Referring Physician: Heath Lark, DO  Chief Complaint: Portal vein thrombosis, esophageal varices  HPI:  This is a 80 y.o. male with past medical history of [name] cirrhosis complicated by nonbleeding grade 2 esophageal varices, ascites, history of bleeding gastric AVM, peptic ulcer disease status post Billroth II gastrectomy, atrial fibrillation, diabetes, GERD, hyperlipidemia, hypertension, prostate cancer, who presents to the ER after being found to have acute onset of near occlusive thrombus of the portal vein with extension to the proximal splenic vein, portal splenic venous confluence and SMV on recent MRCP.   The patient denies having any symptoms or complaints at the moment.  Is not taking any anticoagulation or high-dose antiplatelet.  He states feeling fine and denies any melena or hematochezia.  He recently underwent an MRCP as outpatient to evaluate presence of a thickened gallbladder found on a CT scan performed on 01/28/2021 at [facility] which showed  increased thickening and enhancement which was initially concerning for possible malignancy, as well as mildly enlarged retroperitoneal lymph nodes with amorphus soft tissue mass in the upper  left retroperitoneum.  MRCP performed yesterday showed acute near occlusive thrombosis of the main portal vein extending into the proximal splenic vein, portosplenic venous confluence and adjacent SMV; Generalized mild pancreatic and peripancreatic edema, most prominent in the pancreatic head and neck region;cirrhosis without masses, mild diffuse gallbladder wall thickening is unchanged and favored to be due to pericholecystic varices.  Notably, the patient was recently started on carvedilol on  01/28/2021 for prophylaxis of esophageal varices but he was scheduled to undergo an EGD on 02/16/2021.  Past Medical History: SEE CHRONIC ISSSUES: Past Medical History:  Diagnosis Date   Arthritis    Hands/Fingers   Back pain    Cirrhosis of liver ([place])    Diabetes mellitus ([place])    Enlarged prostate    Gastric ulcer    GERD (gastroesophageal reflux disease)    H/O bladder problems    Hypercholesteremia    Hypertension    Kidney stone    Prostate cancer ([place])    Thyroid disease    UGI bleed 10/26/2016   Vitamin B12 deficiency 10/26/2016   Past Surgical History:  Past Surgical History:  Procedure Laterality Date   CIRCUMCISION     at age 4   COLONOSCOPY N/A 11/13/2015   Procedure: COLONOSCOPY;  Surgeon: Rogene Houston, MD;  [facility];  Service: Endoscopy;  Laterality: N/A;  2:10   COLONOSCOPY WITH PROPOFOL N/A 06/17/2020   Procedure: COLONOSCOPY WITH PROPOFOL;  Surgeon: Rogene Houston, MD;  [facility];  Service: Endoscopy;  Laterality: N/A;  AM / patient had positive covid 05/25/20   ESOPHAGOGASTRODUODENOSCOPY N/A 06/03/2014   Procedure: ESOPHAGOGASTRODUODENOSCOPY (EGD);  Surgeon: Rogene Houston, MD;  [facility];  Service: Endoscopy;  Laterality: N/A;  730   ESOPHAGOGASTRODUODENOSCOPY N/A 10/26/2016   Procedure: ESOPHAGOGASTRODUODENOSCOPY (EGD);  Surgeon: Rogene Houston, MD;  [facility];  Service: Endoscopy;  Laterality: N/A;   ESOPHAGOGASTRODUODENOSCOPY (EGD) WITH PROPOFOL N/A 06/17/2020   Procedure: ESOPHAGOGASTRODUODENOSCOPY (EGD)  WITH PROPOFOL;  Surgeon: Rogene Houston, MD;  [facility];  Service: Endoscopy;  Laterality: N/A;   Gastric Ulcer  1993   GIVENS CAPSULE STUDY N/A 10/28/2016   Procedure: GIVENS CAPSULE STUDY;  Surgeon: Rogene Houston, MD;  [facility];  Service: Endoscopy;  Laterality: N/A;   HERNIA REPAIR     POLYPECTOMY  11/13/2015   Procedure: POLYPECTOMY;  Surgeon: Rogene Houston, MD;  [facility];  Service: Endoscopy;;  colon   POLYPECTOMY  06/17/2020   Procedure: POLYPECTOMY;  Surgeon: Rogene Houston, MD;  [facility];  Service: Endoscopy;;  sigmoid   Right Elbow Right    A pin was put in   Family History:  Family History  Problem Relation Age of Onset   Emphysema Mother    Cerebrovascular Accident Father    Heart disease Father    Diabetes Sister    Heart disease Brother    Heart disease Sister    Heart disease Brother    Diabetes Brother    Social History:  Social History   Tobacco Use   Smoking status: Former    Types: Cigarettes    Quit date: 05/25/1989    Years since quitting: 31.7   Smokeless tobacco: Former    Quit date: 01/16/1990  Vaping Use   Vaping Use: Never used  Substance Use Topics   Alcohol use: No    Alcohol/week: 0.0 standard drinks    Comment: Drank many years - 30 years ago   Drug use: No    Home Medications:  Prior to Admission medications   Medication Sig Start Date End Date Taking? Authorizing Provider  acetaminophen (TYLENOL) 500 MG tablet Take 500 mg by mouth every 6 (six) hours as needed.   Yes [provider]  aspirin EC 81 MG tablet Take 81 mg by mouth daily. Swallow whole.   Yes [provider]  Cranberry 500 MG TABS Take 500 mg by mouth daily.   Yes [provider]  ferrous sulfate 325 (65 FE) MG tablet Take 325 mg by mouth daily with breakfast.   Yes [provider]  furosemide (LASIX) 20 MG tablet Take 1 tablet by mouth once daily Patient taking differently: Take 40 mg by mouth daily. 10/19/20  Yes Branch, Alphonse Guild, MD  glipiZIDE (GLUCOTROL) 10 MG tablet Take 10 mg by mouth 2 (two) times daily before a meal.   Yes [provider]  levothyroxine (SYNTHROID) 150 MCG tablet Take 137 mcg by mouth daily before breakfast.   Yes [provider]  lisinopril (ZESTRIL) 5 MG tablet Take 1 tablet (5 mg total) by mouth daily. 02/02/21  Yes  BranchAlphonse Guild, MD  magnesium oxide (MAG-OX) 400 MG tablet Take 400 mg by mouth daily.   Yes [provider]  simvastatin (ZOCOR) 40 MG tablet Take 40 mg by mouth at bedtime.    Yes [provider]  spironolactone (ALDACTONE) 50 MG tablet Take 1 tablet (50 mg total) by mouth daily. 05/10/20  Yes Rehman, Mechele Dawley, MD  carvedilol (COREG) 3.125 MG tablet Take 1 tablet (3.125 mg total) by mouth 2 (two) times daily with a meal. Patient not taking: Reported on 02/12/2021 01/28/21   Harvel Quale, MD  dicyclomine (BENTYL) 10 MG capsule Take 1 capsule (10 mg total) by mouth 2 (two) times daily as needed for spasms. Patient not taking: Reported on 02/12/2021 07/14/20   Rogene Houston, MD  Inpatient Medications:  Current Facility-Administered Medications:    [MAR Hold] carvedilol (COREG) tablet 6.25 mg, 6.25 mg, Oral, BID WC, Elgergawy, Silver Huguenin, MD, 6.25 mg at 02/12/21 1758   [MAR Hold] insulin aspart (novoLOG) injection 0-5 Units, 0-5 Units, Subcutaneous, QHS, Elgergawy, Silver Huguenin, MD   [MAR Hold] insulin aspart (novoLOG) injection 0-9 Units, 0-9 Units, Subcutaneous, TID WC, Elgergawy, Silver Huguenin, MD   [MAR Hold] levothyroxine (SYNTHROID) tablet 137 mcg, 137 mcg, Oral, QAC breakfast, Elgergawy, Silver Huguenin, MD  Facility-Administered Medications Ordered in Other Encounters:    lactated ringers infusion, , Intravenous, Continuous PRN, Denese Killings, MD, New Bag at 02/13/21 0809   lidocaine (cardiac) 100 mg/5m (XYLOCAINE) injection 2%, , Tracheal Tube, Anesthesia Intra-op, Battula, Rajamani C, MD, 50 mg at 02/13/21 0815   metoprolol tartrate (LOPRESSOR) injection, , Intravenous, Anesthesia Intra-op, Battula, Rajamani C, MD, 1 mg at 02/13/21 0813   PHENYLephrine 40 mcg/ml in normal saline Adult IV Push Syringe (For Blood Pressure Support), , Intravenous, Anesthesia Intra-op, Battula, Rajamani C, MD, 80 mcg at 02/13/21 0835   propofol (DIPRIVAN) 10 mg/mL bolus/IV push, ,  Intravenous, Anesthesia Intra-op, Battula, Rajamani C, MD, 10 mg at 02/13/21 0830   propofol (DIPRIVAN) 500 MG/50ML infusion, , Intravenous, Continuous PRN, BCharna Elizabeth Rajamani C, MD, Stopped at 02/13/21 [id number]: Penicillins  Complete Review of Systems: GENERAL: negative for malaise, night sweats HEENT: No changes in hearing or vision, no nose bleeds or other nasal problems. NECK: Negative for lumps, goiter, pain and significant neck swelling RESPIRATORY: Negative for cough, wheezing CARDIOVASCULAR: Negative for chest pain, leg swelling, palpitations, orthopnea GI: SEE HPI MUSCULOSKELETAL: Negative for joint pain or swelling, back pain, and muscle pain. SKIN: Negative for lesions, rash PSYCH: Negative for sleep disturbance, mood disorder and recent psychosocial stressors. HEMATOLOGY Negative for prolonged bleeding, bruising easily, and swollen nodes. ENDOCRINE: Negative for cold or heat intolerance, polyuria, polydipsia and goiter. NEURO: negative for tremor, gait imbalance, syncope and seizures. The remainder of the review of systems is noncontributory.  Physical Exam: BP 122/73   Pulse (!) 116   Temp 97.9 F (36.6 C) (Oral)   Resp 17   Ht 5' 9"  (1.753 m)   Wt 86.2 kg   SpO2 99%   BMI 28.06 kg/m  GENERAL: The patient is AO x3, in no acute distress. HEENT: Head is normocephalic and atraumatic. EOMI are intact. Mouth is well hydrated and without lesions. NECK: Supple. No masses LUNGS: Clear to auscultation. No presence of rhonchi/wheezing/rales. Adequate chest expansion HEART: RRR, normal s1 and s2. ABDOMEN: Soft, nontender, no guarding, no peritoneal signs, and nondistended. BS +. No masses. EXTREMITIES: Without any cyanosis, clubbing, rash, lesions or edema. NEUROLOGIC: AOx3, no focal motor deficit. SKIN: no jaundice, no rashes  Laboratory Data CBC:     Component Value Date/Time   WBC 3.2 (L) 02/13/2021 0442   RBC 2.88 (L) 02/13/2021 0442   HGB 8.6 (L) 02/13/2021  0442   HCT 28.3 (L) 02/13/2021 0442   PLT 102 (L) 02/13/2021 0442   MCV 98.3 02/13/2021 0442   MCH 29.9 02/13/2021 0442   MCHC 30.4 02/13/2021 0442   RDW 15.7 (H) 02/13/2021 0442   LYMPHSABS 0.8 02/12/2021 1439   MONOABS 0.5 02/12/2021 1439   EOSABS 0.0 02/12/2021 1439   BASOSABS 0.0 02/12/2021 1439   COAG:  Lab Results  Component Value Date   INR 1.4 (H) 02/13/2021   INR 1.3 (H) 02/12/2021   INR 1.2 (H) 01/28/2021    BMP:  BMP Latest  Ref Rng & Units 02/13/2021 02/12/2021 08/03/2020  Glucose 70 - 99 mg/dL 144(H) 225(H) 163(H)  BUN 8 - 23 mg/dL 25(H) 27(H) 29(H)  Creatinine 0.61 - 1.24 mg/dL 1.33(H) 1.63(H) 1.02  BUN/Creat Ratio 6 - 22 (calc) - - 28(H)  Sodium 135 - 145 mmol/L 138 136 140  Potassium 3.5 - 5.1 mmol/L 4.8 4.7 4.6  Chloride 98 - 111 mmol/L 108 107 104  CO2 22 - 32 mmol/L 24 22 30   Calcium 8.9 - 10.3 mg/dL 8.3(L) 8.4(L) 9.4    HEPATIC:  Hepatic Function Latest Ref Rng & Units 02/13/2021 02/12/2021 05/25/2020  Total Protein 6.5 - 8.1 g/dL 6.7 7.7 8.0  Albumin 3.5 - 5.0 g/dL 2.6(L) 3.1(L) 3.4(L)  AST 15 - 41 U/L 25 29 26   ALT 0 - 44 U/L 17 21 15   Alk Phosphatase 38 - 126 U/L 92 113 81  Total Bilirubin 0.3 - 1.2 mg/dL 0.7 0.7 1.4(H)    CARDIAC:  Lab Results  Component Value Date   TROPONINI <0.03 10/24/2016     Imaging: I personally reviewed and interpreted the available imaging.  Assessment & Plan: 80 y.o. male with past medical history of [name] cirrhosis complicated by nonbleeding grade 2 esophageal varices, ascites, history of bleeding gastric AVM, peptic ulcer disease status post Billroth II gastrectomy, atrial fibrillation, diabetes, GERD, hyperlipidemia, hypertension, prostate cancer, who presents to the ER after being found to have acute onset of near occlusive thrombus of the portal vein with extension to the proximal splenic vein, portal splenic venous confluence and SMV on recent MRCP.  Patient had confirmation of presence of new thrombosis in his main  portal vein with extension to the left branch of the portal vein, but with presence of patent splenic, inferior mesenteric and superior mesenteric vein.  There was also presence of chronic large left retroperitoneal splenorenal shunt.  He is not presenting any worsening ascites at the moment. As there is patency of the vessels, he is not presenting any abdominal concerning for significant venous congestion and enteropathy.  I will discuss these findings with the interventional radiologist on-call to determine the next steps in his care but he will need to proceed today with an EGD with possible esophageal varices banding.  As he is presenting tachycardia, his A. fib can be further controlled by increasing his Coreg (max dose of 12.5 mg twice a day).  I discussed with him that given his history of anxiety disease esophageal varices and previous peptic ulcer disease, he may not be the best candidate for systemic anticoagulation.  The patient is up-to-date regarding his [place] screening.  Is not presenting  any signs of encephalopathy and has not presented any significant ascites.  - Repeat CBC qday, transfuse if Hb <7 - 2 large bore IV lines - Active T/S - Keep NPO - Avoid NSAIDs - Will proceed with EGD today - Will discuss case again with IR - Dr. Pascal Lux - Increase Coreg to 6.25 BID, can go up to 12.5 mg twice a day - Continue spironolactone 50 mg qday and furosemide 20 mg qday  Harvel Quale, MD Gastroenterology and Hepatology [facility]

## 2021-02-14 DIAGNOSIS — K7581 Nonalcoholic steatohepatitis (NASH): Secondary | ICD-10-CM

## 2021-02-14 DIAGNOSIS — I4891 Unspecified atrial fibrillation: Secondary | ICD-10-CM

## 2021-02-14 DIAGNOSIS — I81 Portal vein thrombosis: Principal | ICD-10-CM

## 2021-02-14 DIAGNOSIS — I8510 Secondary esophageal varices without bleeding: Secondary | ICD-10-CM | POA: Diagnosis not present

## 2021-02-14 DIAGNOSIS — K7460 Unspecified cirrhosis of liver: Secondary | ICD-10-CM

## 2021-02-14 LAB — CBC
HCT: 30.6 % — ABNORMAL LOW (ref 39.0–52.0)
Hemoglobin: 9.3 g/dL — ABNORMAL LOW (ref 13.0–17.0)
MCH: 30 pg (ref 26.0–34.0)
MCHC: 30.4 g/dL (ref 30.0–36.0)
MCV: 98.7 fL (ref 80.0–100.0)
Platelets: 107 10*3/uL — ABNORMAL LOW (ref 150–400)
RBC: 3.1 MIL/uL — ABNORMAL LOW (ref 4.22–5.81)
RDW: 15.9 % — ABNORMAL HIGH (ref 11.5–15.5)
WBC: 3.5 10*3/uL — ABNORMAL LOW (ref 4.0–10.5)
nRBC: 0 % (ref 0.0–0.2)

## 2021-02-14 LAB — GLUCOSE, CAPILLARY
Glucose-Capillary: 147 mg/dL — ABNORMAL HIGH (ref 70–99)
Glucose-Capillary: 192 mg/dL — ABNORMAL HIGH (ref 70–99)

## 2021-02-14 LAB — BASIC METABOLIC PANEL
Anion gap: 7 (ref 5–15)
BUN: 20 mg/dL (ref 8–23)
CO2: 24 mmol/L (ref 22–32)
Calcium: 8.8 mg/dL — ABNORMAL LOW (ref 8.9–10.3)
Chloride: 106 mmol/L (ref 98–111)
Creatinine, Ser: 1.25 mg/dL — ABNORMAL HIGH (ref 0.61–1.24)
GFR, Estimated: 58 mL/min — ABNORMAL LOW (ref >60)
Glucose, Bld: 139 mg/dL — ABNORMAL HIGH (ref 70–99)
Potassium: 5.1 mmol/L (ref 3.5–5.1)
Sodium: 137 mmol/L (ref 135–145)

## 2021-02-14 LAB — MAGNESIUM: Magnesium: 1.6 mg/dL — ABNORMAL LOW (ref 1.7–2.4)

## 2021-02-14 MED ORDER — CARVEDILOL 6.25 MG PO TABS: 6.2500 mg | ORAL_TABLET | Freq: Two times a day (BID) | ORAL | 2 refills | Status: DC

## 2021-02-14 MED ORDER — CARVEDILOL 12.5 MG PO TABS: 12.5000 mg | ORAL_TABLET | Freq: Two times a day (BID) | ORAL | 11 refills | Status: DC

## 2021-02-14 MED ORDER — MAGNESIUM SULFATE 2 GM/50ML IV SOLN
2.0000 g | Freq: Once | INTRAVENOUS | Status: AC
  Administered 2021-02-14: 09:00:00 2 g via INTRAVENOUS
  Filled 2021-02-14: qty 50

## 2021-02-14 MED ORDER — ALUM & MAG HYDROXIDE-SIMETH 200-200-20 MG/5ML PO SUSP
30.0000 mL | Freq: Once | ORAL | Status: AC
  Administered 2021-02-14: 05:00:00 30 mL via ORAL
  Filled 2021-02-14: qty 30

## 2021-02-14 MED ORDER — PANTOPRAZOLE SODIUM 40 MG PO TBEC
40.0000 mg | DELAYED_RELEASE_TABLET | Freq: Two times a day (BID) | ORAL | 2 refills | Status: DC
Start: 2021-02-14 — End: 2021-05-19

## 2021-02-14 NOTE — Discharge Summary (Addendum)
Physician Discharge Summary  Tyler Farmer JIR:[id number] DOB: 11-Mar-1941 DOA: 02/12/2021  PCP: [name], MD  Admit date: 02/12/2021  Discharge date: 02/14/2021  Admitted From:Home  Disposition:  Home  Recommendations for Outpatient Follow-up:  Follow up with PCP in 1-2 weeks Follow-up with GI as recommended within the next 4 weeks for repeat EGD as well as CT abdomen and pelvis with IV contrast Continue higher dose Coreg of 12.5 mg twice daily to assist with A. fib as well as varices Lisinopril discontinued Continue PPI twice daily as prescribed Continue other home medications as prior  Home Health: None  Equipment/Devices: None  Discharge Condition:Stable  CODE STATUS: Full  Diet recommendation: Heart Healthy  Brief/Interim Summary:  Tyler Farmer  is a 80 y.o. male, with past medical history of A. fib, not anticoagulation due to history of GI bleed in the past, NASH  cirrhosis, diabetes mellitus, hyperlipidemia, hypertension, hypothyroidism, patient was sent to ED by his GI physician for abnormal MRI, patient is followed by Dr. Jenetta Downer, he had MRI done today which did show portal vein thrombosis, so he was sent to ED to receive CT abdomen pelvis with contrast for further evaluation, patient received CTA abdomen pelvis which did confirm acute/subacute portal vein thrombosis, was not present on 01/27/2021 previous imaging, patient currently off Eliquis due to his previous GI bleed, wife confirms that he is only on baby aspirin, patient denies any fever, chills.  He has undergone EGD on 11/26 with noted grade 3 esophageal varices status post banding with clips placed to oozing gastric ulcer.  He has continued to have stable hemoglobin levels with no further overt bleeding identified.  He is tolerating diet as well.  He has been seen by GI and is noted to be stable for discharge on PPI twice daily as recommended.  Plan will be for EGD in approximately 1 month as well as  repeat CT abdomen and pelvis with IV contrast to reevaluate portal vein thrombus.  No other acute events noted throughout the course of the stay.  Discharge Diagnoses:  Principal Problem:   Portal vein thrombosis Active Problems:   HTN (hypertension)   DM (diabetes mellitus) ([place])   COPD (chronic obstructive pulmonary disease) ([place])  Principal discharge diagnosis: Partially occlusive acute/subacute portal vein thrombus status post EGD 11/26 with banding to grade 3 esophageal varices as well as hemostasis with clips to oozing gastric ulcer.  Discharge Instructions  Discharge Instructions     Ambulatory referral to Gastroenterology   Complete by: As directed    Diet - low sodium heart healthy   Complete by: As directed    Increase activity slowly   Complete by: As directed       Allergies as of 02/14/2021       Reactions   Penicillins Nausea And Vomiting   Has patient had a PCN reaction causing immediate rash, facial/tongue/throat swelling, SOB or lightheadedness with hypotension: Yes Has patient had a PCN reaction causing severe rash involving mucus membranes or skin necrosis: No Has patient had a PCN reaction that required hospitalization: No Has patient had a PCN reaction occurring within the last 10 years: No If all of the above answers are "NO", then may proceed with Cephalosporin use. Patient states that it also caused pain in his sides.        Medication List     STOP taking these medications    lisinopril 5 MG tablet Commonly known as: ZESTRIL       TAKE  these medications    acetaminophen 500 MG tablet Commonly known as: TYLENOL Take 500 mg by mouth every 6 (six) hours as needed.   aspirin EC 81 MG tablet Take 81 mg by mouth daily. Swallow whole.   carvedilol 12.5 MG tablet Commonly known as: Coreg Take 1 tablet (12.5 mg total) by mouth 2 (two) times daily. What changed:  medication strength how much to take when to take this   Cranberry 500 MG  Tabs Take 500 mg by mouth daily.   dicyclomine 10 MG capsule Commonly known as: BENTYL Take 1 capsule (10 mg total) by mouth 2 (two) times daily as needed for spasms.   ferrous sulfate 325 (65 FE) MG tablet Take 325 mg by mouth daily with breakfast.   furosemide 20 MG tablet Commonly known as: LASIX Take 1 tablet by mouth once daily What changed: how much to take   glipiZIDE 10 MG tablet Commonly known as: GLUCOTROL Take 10 mg by mouth 2 (two) times daily before a meal.   levothyroxine 150 MCG tablet Commonly known as: SYNTHROID Take 137 mcg by mouth daily before breakfast.   magnesium oxide 400 MG tablet Commonly known as: MAG-OX Take 400 mg by mouth daily.   pantoprazole 40 MG tablet Commonly known as: PROTONIX Take 1 tablet (40 mg total) by mouth 2 (two) times daily.   simvastatin 40 MG tablet Commonly known as: ZOCOR Take 40 mg by mouth at bedtime.   spironolactone 50 MG tablet Commonly known as: ALDACTONE Take 1 tablet (50 mg total) by mouth daily.        Follow-up Information     Sasser, Silvestre Moment, MD. Schedule an appointment as soon as possible for a visit in 1 week(s).   Specialty: Family Medicine Contact information: [place] [address] [phone]         [place]. Go in 4 week(s).   Contact information: [place] [phone]               Allergies  Allergen Reactions   Penicillins Nausea And Vomiting    Has patient had a PCN reaction causing immediate rash, facial/tongue/throat swelling, SOB or lightheadedness with hypotension: Yes Has patient had a PCN reaction causing severe rash involving mucus membranes or skin necrosis: No Has patient had a PCN reaction that required hospitalization: No Has patient had a PCN reaction occurring within the last 10 years: No If all of the above answers are "NO", then may proceed with Cephalosporin use.   Patient  states that it also caused pain in his sides.    Consultations: GI   Procedures/Studies: MR 3D Recon At Scanner  Result Date: 02/12/2021 CLINICAL DATA:  Right upper quadrant abdominal pain. Recent abnormal abdominal CT and ultrasound with indeterminate gallbladder wall thickening. EXAM: MRI ABDOMEN WITHOUT AND WITH CONTRAST (INCLUDING MRCP) TECHNIQUE: Multiplanar multisequence MR imaging of the abdomen was performed both before and after the administration of intravenous contrast. Dynamic post-contrast sequences were inadvertently obtained in the coronal plane. Heavily T2-weighted images of the biliary and pancreatic ducts were obtained, and three-dimensional MRCP images were rendered by post processing. CONTRAST:  74m GADAVIST GADOBUTROL 1 MMOL/ML IV SOLN COMPARISON:  01/27/2021 right upper quadrant abdominal sonogram and CT abdomen/pelvis. 06/02/2014 MRI abdomen. FINDINGS: Motion degraded scan, limiting assessment. Lower chest: No acute abnormality at the lung bases. Hepatobiliary: Diffusely mildly irregular liver surface, compatible with cirrhosis. No hepatic steatosis. No liver mass. No gallstones. Gallbladder  is nondistended. Mild diffuse gallbladder wall thickening with the suggestion of mild pericholecystic varices along the gallbladder wall adjacent to the liver, unchanged from recent CT. No biliary ductal dilatation. Common bile duct diameter 5-6 mm. No choledocholithiasis. No biliary masses, strictures or beading. Pancreas: Generalized mild pancreatic parenchymal and peripancreatic edema, most prominent in the pancreatic head and neck region. No peripancreatic fluid collections. Preserved pancreatic parenchymal enhancement. No pancreatic mass or duct dilation. No pancreas divisum. Spleen: Mild splenomegaly. Craniocaudal splenic length 14.3 cm. No splenic masses. Adrenals/Urinary Tract: No discrete adrenal nodules. No hydronephrosis. Numerous simple bilateral renal cortical cysts, largest 6.2 cm  in the posterior lower left kidney. No suspicious renal masses. Stomach/Bowel: Small hiatal hernia. Stable postsurgical changes from distal gastrectomy with gastrojejunostomy. No acute gastric abnormality. Visualized small and large bowel is normal caliber, with no bowel wall thickening. Vascular/Lymphatic: Atherosclerotic nonaneurysmal abdominal aorta. Acute near occlusive thrombosis of the main portal vein extending into the proximal splenic vein, portosplenic venous confluence and adjacent SMV. Retroaortic left renal vein. Patent renal and hepatic veins. Ill-defined high left para-aortic lymphadenopathy measuring up to 2.5 cm short axis diameter (series 25/image 44), not appreciably changed since 05/08/2020 CT abdomen study. Mild aortocaval adenopathy up to 1.2 cm (series 25/image 72), stable since 05/08/2020 CT. Other: No abdominal ascites or focal fluid collection. Musculoskeletal: No aggressive appearing focal osseous lesions. IMPRESSION: 1. Acute near occlusive thrombosis of the main portal vein extending into the proximal splenic vein, portosplenic venous confluence and adjacent SMV. 2. Generalized mild pancreatic and peripancreatic edema, most prominent in the pancreatic head and neck region, nonspecific, probably secondary to the acute portal vein thrombosis, although acute non-necrotizing pancreatitis is on the differential. No peripancreatic fluid collections. Suggest correlation with serum lipase. 3. Cirrhosis. No liver masses. 4. Mild diffuse gallbladder wall thickening is unchanged and favored to be due to pericholecystic varices. 5. Mild splenomegaly. No ascites. 6. Small hiatal hernia. These results will be called to the ordering clinician or representative by the Radiologist Assistant, and communication documented in the PACS or [employer]. Electronically Signed   By: Ilona Sorrel M.D.   On: 02/12/2021 11:50   MR ABDOMEN MRCP W WO CONTAST  Result Date: 02/12/2021 CLINICAL DATA:  Right  upper quadrant abdominal pain. Recent abnormal abdominal CT and ultrasound with indeterminate gallbladder wall thickening. EXAM: MRI ABDOMEN WITHOUT AND WITH CONTRAST (INCLUDING MRCP) TECHNIQUE: Multiplanar multisequence MR imaging of the abdomen was performed both before and after the administration of intravenous contrast. Dynamic post-contrast sequences were inadvertently obtained in the coronal plane. Heavily T2-weighted images of the biliary and pancreatic ducts were obtained, and three-dimensional MRCP images were rendered by post processing. CONTRAST:  23m GADAVIST GADOBUTROL 1 MMOL/ML IV SOLN COMPARISON:  01/27/2021 right upper quadrant abdominal sonogram and CT abdomen/pelvis. 06/02/2014 MRI abdomen. FINDINGS: Motion degraded scan, limiting assessment. Lower chest: No acute abnormality at the lung bases. Hepatobiliary: Diffusely mildly irregular liver surface, compatible with cirrhosis. No hepatic steatosis. No liver mass. No gallstones. Gallbladder is nondistended. Mild diffuse gallbladder wall thickening with the suggestion of mild pericholecystic varices along the gallbladder wall adjacent to the liver, unchanged from recent CT. No biliary ductal dilatation. Common bile duct diameter 5-6 mm. No choledocholithiasis. No biliary masses, strictures or beading. Pancreas: Generalized mild pancreatic parenchymal and peripancreatic edema, most prominent in the pancreatic head and neck region. No peripancreatic fluid collections. Preserved pancreatic parenchymal enhancement. No pancreatic mass or duct dilation. No pancreas divisum. Spleen: Mild splenomegaly. Craniocaudal splenic length 14.3 cm. No  splenic masses. Adrenals/Urinary Tract: No discrete adrenal nodules. No hydronephrosis. Numerous simple bilateral renal cortical cysts, largest 6.2 cm in the posterior lower left kidney. No suspicious renal masses. Stomach/Bowel: Small hiatal hernia. Stable postsurgical changes from distal gastrectomy with  gastrojejunostomy. No acute gastric abnormality. Visualized small and large bowel is normal caliber, with no bowel wall thickening. Vascular/Lymphatic: Atherosclerotic nonaneurysmal abdominal aorta. Acute near occlusive thrombosis of the main portal vein extending into the proximal splenic vein, portosplenic venous confluence and adjacent SMV. Retroaortic left renal vein. Patent renal and hepatic veins. Ill-defined high left para-aortic lymphadenopathy measuring up to 2.5 cm short axis diameter (series 25/image 44), not appreciably changed since 05/08/2020 CT abdomen study. Mild aortocaval adenopathy up to 1.2 cm (series 25/image 72), stable since 05/08/2020 CT. Other: No abdominal ascites or focal fluid collection. Musculoskeletal: No aggressive appearing focal osseous lesions. IMPRESSION: 1. Acute near occlusive thrombosis of the main portal vein extending into the proximal splenic vein, portosplenic venous confluence and adjacent SMV. 2. Generalized mild pancreatic and peripancreatic edema, most prominent in the pancreatic head and neck region, nonspecific, probably secondary to the acute portal vein thrombosis, although acute non-necrotizing pancreatitis is on the differential. No peripancreatic fluid collections. Suggest correlation with serum lipase. 3. Cirrhosis. No liver masses. 4. Mild diffuse gallbladder wall thickening is unchanged and favored to be due to pericholecystic varices. 5. Mild splenomegaly. No ascites. 6. Small hiatal hernia. These results will be called to the ordering clinician or representative by the Radiologist Assistant, and communication documented in the PACS or [employer]. Electronically Signed   By: Ilona Sorrel M.D.   On: 02/12/2021 11:50   CT Angio Abd/Pel W and/or Wo Contrast  Result Date: 02/12/2021 CLINICAL DATA:  Acute mesenteric ischemia, diabetes, cirrhosis, hypertension EXAM: CTA ABDOMEN AND PELVIS WITH CONTRAST TECHNIQUE: Multidetector CT imaging of the abdomen  and pelvis was performed using the standard protocol during bolus administration of intravenous contrast. Multiplanar reconstructed images and MIPs were obtained and reviewed to evaluate the vascular anatomy. CONTRAST:  28m OMNIPAQUE IOHEXOL 350 MG/ML SOLN COMPARISON:  MR from earlier the same day, CT 05/08/2020 FINDINGS: VASCULAR Coronary calcifications. Aorta: Moderate calcified plaque. No aneurysm, dissection, or stenosis. Celiac: Calcified ostial plaque with only mild stenosis, patent distally. SMA: Calcified ostial plaque extending over length of 2 cm resulting in mild stenosis, patent distally. Replaced hepatic arterial supply, an anatomic variant. Renals: Single left with calcified ostial plaque extending over length of at least 1.9 cm resulting in at least mild stenosis, patent distally. Single right, with calcified ostial plaque resulting in short segment stenosis of at least mild severity, patent distally. IMA: Patent without evidence of aneurysm, dissection, vasculitis or significant stenosis. Inflow: Scattered calcified plaque. No aneurysm, dissection, or stenosis. Proximal Outflow: Atheromatous, patent. Veins: Partially occlusive intraluminal thrombus at the portosplenic confluence extending through the length of the main portal vein and into the left portal vein, as was described on earlier MR of the same day, and new since CT 01/27/2021. The splenic vein is patent. Inferior mesenteric vein is patent. Tributaries of the SMV appear patent. Hepatic veins are patent. Moderate esophageal varices. Chronic large left retroperitoneal splenorenal venous shunt. Retroaortic left renal vein, an anatomic variant. Right renal vein patent. Iliac venous system and IVC unremarkable. Review of the MIP images confirms the above findings. NON-VASCULAR Lower chest: No pleural or pericardial effusion. Visualized lung bases clear. Hepatobiliary: Nodular hepatic contour. No focal liver lesion. Symmetric parenchymal  enhancement. Subcentimeter hyperdensities in the dependent aspect of  the nondistended gallbladder possibly small partially calcified gallstones. Pancreas: Unremarkable. No pancreatic ductal dilatation or surrounding inflammatory changes. Spleen: Splenomegaly, 15.1 cm in length.  No focal lesion. Adrenals/Urinary Tract: Adrenal glands unremarkable. Bilateral renal cysts, largest 6.5 from the mid left kidney. No urolithiasis or hydronephrosis. Urinary bladder is physiologically distended. Stomach/Bowel: Esophageal varices. No significant gastric varices. Gastrojejunostomy and apparent distal partial gastrectomy. Small bowel and colon are nondilated. Lymphatic: Increased number of borderline enlarged left para-aortic and aortocaval lymph nodes as before. Borderline central mesenteric adenopathy. No pelvic adenopathy. Reproductive: Prostate is unremarkable. Other: Persistent inflammatory/edematous changes in the retroperitoneum posterior to the pancreas. No ascites. No free air. Musculoskeletal: Advanced facet DJD L4-5, allowing grade 1 anterolisthesis. Negative for fracture or worrisome bone lesion. IMPRESSION: 1. Partially occlusive acute/subacute venous thrombus extending from the portosplenic confluence through the length of the main portal vein and into the left portal vein, as seen on earlier MRI, and new since earlier study of 01/27/2021. 2. No new bowel or mesenteric changes to suggest ischemia. 3. Chronic large splenorenal shunt and smaller esophageal varices provide collateral decompression of the mesenteric venous system. 4. Morphologic changes of cirrhosis. 5. Chronic central mesenteric and retroperitoneal adenopathy, nonspecific. 6. Coronary and Aortic Atherosclerosis (ICD10-170.0). Electronically Signed   By: Lucrezia Europe M.D.   On: 02/12/2021 16:43     Discharge Exam: Vitals:   02/13/21 2113 02/14/21 0430  BP: (!) 95/49 121/76  Pulse: 92 (!) 104  Resp: 19 19  Temp: (!) 97.5 F (36.4 C) 97.6 F  (36.4 C)  SpO2: 100% 100%   Vitals:   02/13/21 1800 02/13/21 2027 02/13/21 2113 02/14/21 0430  BP:  100/65 (!) 95/49 121/76  Pulse: 90 80 92 (!) 104  Resp:  20 19 19   Temp:  98 F (36.7 C) (!) 97.5 F (36.4 C) 97.6 F (36.4 C)  TempSrc:  Oral Oral Oral  SpO2:  100% 100% 100%  Weight:      Height:        General: Pt is alert, awake, not in acute distress Cardiovascular: RRR, S1/S2 +, no rubs, no gallops Respiratory: CTA bilaterally, no wheezing, no rhonchi Abdominal: Soft, NT, ND, bowel sounds + Extremities: no edema, no cyanosis    The results of significant diagnostics from this hospitalization (including imaging, microbiology, ancillary and laboratory) are listed below for reference.     Microbiology: Recent Results (from the past 240 hour(s))  Resp Panel by RT-PCR (Flu A&B, Covid) Nasopharyngeal Swab     Status: None   Collection Time: 02/12/21  3:16 PM   Specimen: Nasopharyngeal Swab; Nasopharyngeal(NP) swabs in vial transport medium  Result Value Ref Range Status   SARS Coronavirus 2 by RT PCR NEGATIVE NEGATIVE Final    Comment: (NOTE) SARS-CoV-2 target nucleic acids are NOT DETECTED.  The SARS-CoV-2 RNA is generally detectable in upper respiratory specimens during the acute phase of infection. The lowest concentration of SARS-CoV-2 viral copies this assay can detect is 138 copies/mL. A negative result does not preclude SARS-Cov-2 infection and should not be used as the sole basis for treatment or other patient management decisions. A negative result may occur with  improper specimen collection/handling, submission of specimen other than nasopharyngeal swab, presence of viral mutation(s) within the areas targeted by this assay, and inadequate number of viral copies(<138 copies/mL). A negative result must be combined with clinical observations, patient history, and epidemiological information. The expected result is Negative.  Fact Sheet for Patients:   EntrepreneurPulse.com.au  Fact Sheet for  Healthcare Providers:  IncredibleEmployment.be  This test is no t yet approved or cleared by the [place] and  has been authorized for detection and/or diagnosis of SARS-CoV-2 by FDA under an Emergency Use Authorization (EUA). This EUA will remain  in effect (meaning this test can be used) for the duration of the COVID-19 declaration under Section 564(b)(1) of the Act, 21 U.S.C.section 360bbb-3(b)(1), unless the authorization is terminated  or revoked sooner.       Influenza A by PCR NEGATIVE NEGATIVE Final   Influenza B by PCR NEGATIVE NEGATIVE Final    Comment: (NOTE) The Xpert Xpress SARS-CoV-2/FLU/RSV plus assay is intended as an aid in the diagnosis of influenza from Nasopharyngeal swab specimens and should not be used as a sole basis for treatment. Nasal washings and aspirates are unacceptable for Xpert Xpress SARS-CoV-2/FLU/RSV testing.  Fact Sheet for Patients: EntrepreneurPulse.com.au  Fact Sheet for Healthcare Providers: IncredibleEmployment.be  This test is not yet approved or cleared by the [place] FDA and has been authorized for detection and/or diagnosis of SARS-CoV-2 by FDA under an Emergency Use Authorization (EUA). This EUA will remain in effect (meaning this test can be used) for the duration of the COVID-19 declaration under Section 564(b)(1) of the Act, 21 U.S.C. section 360bbb-3(b)(1), unless the authorization is terminated or revoked.  Performed at [facility], [address] [place], [place] [address]      Labs: BNP (last 3 results) No results for input(s): BNP in the last 8760 hours. Basic Metabolic Panel: Recent Labs  Lab 02/12/21 1439 02/13/21 0442 02/14/21 0305  NA 136 138 137  K 4.7 4.8 5.1  CL 107 108 106  CO2 22 24 24   GLUCOSE 225* 144* 139*  BUN 27* 25* 20  CREATININE 1.63* 1.33* 1.25*  CALCIUM 8.4*  8.3* 8.8*  MG  --   --  1.6*   Liver Function Tests: Recent Labs  Lab 02/12/21 1439 02/13/21 0442  AST 29 25  ALT 21 17  ALKPHOS 113 92  BILITOT 0.7 0.7  PROT 7.7 6.7  ALBUMIN 3.1* 2.6*   Recent Labs  Lab 02/12/21 1439  LIPASE 33   No results for input(s): AMMONIA in the last 168 hours. CBC: Recent Labs  Lab 02/12/21 1439 02/13/21 0442 02/14/21 0305  WBC 3.6* 3.2* 3.5*  NEUTROABS 2.3  --   --   HGB 9.4* 8.6* 9.3*  HCT 30.2* 28.3* 30.6*  MCV 98.7 98.3 98.7  PLT 113* 102* 107*   Cardiac Enzymes: No results for input(s): CKTOTAL, CKMB, CKMBINDEX, TROPONINI in the last 168 hours. BNP: Invalid input(s): POCBNP CBG: Recent Labs  Lab 02/13/21 1111 02/13/21 1649 02/13/21 2029 02/14/21 0726 02/14/21 1126  GLUCAP 155* 154* 141* 147* 192*   D-Dimer No results for input(s): DDIMER in the last 72 hours. Hgb A1c No results for input(s): HGBA1C in the last 72 hours. Lipid Profile No results for input(s): CHOL, HDL, LDLCALC, TRIG, CHOLHDL, LDLDIRECT in the last 72 hours. Thyroid function studies No results for input(s): TSH, T4TOTAL, T3FREE, THYROIDAB in the last 72 hours.  Invalid input(s): FREET3 Anemia work up No results for input(s): VITAMINB12, FOLATE, FERRITIN, TIBC, IRON, RETICCTPCT in the last 72 hours. Urinalysis    Component Value Date/Time   COLORURINE YELLOW 10/25/2016 0500   APPEARANCEUR Hazy (A) 11/19/2020 1342   LABSPEC 1.025 10/25/2016 0500   PHURINE 5.5 10/25/2016 0500   GLUCOSEU Negative 11/19/2020 1342   HGBUR NEGATIVE 10/25/2016 0500   BILIRUBINUR Negative 11/19/2020 1342  KETONESUR NEGATIVE 10/25/2016 0500   PROTEINUR Negative 11/19/2020 1342   PROTEINUR NEGATIVE 10/25/2016 0500   NITRITE Negative 11/19/2020 1342   NITRITE NEGATIVE 10/25/2016 0500   LEUKOCYTESUR Negative 11/19/2020 1342   Sepsis Labs Invalid input(s): PROCALCITONIN,  WBC,  LACTICIDVEN Microbiology Recent Results (from the past 240 hour(s))  Resp Panel by RT-PCR  (Flu A&B, Covid) Nasopharyngeal Swab     Status: None   Collection Time: 02/12/21  3:16 PM   Specimen: Nasopharyngeal Swab; Nasopharyngeal(NP) swabs in vial transport medium  Result Value Ref Range Status   SARS Coronavirus 2 by RT PCR NEGATIVE NEGATIVE Final    Comment: (NOTE) SARS-CoV-2 target nucleic acids are NOT DETECTED.  The SARS-CoV-2 RNA is generally detectable in upper respiratory specimens during the acute phase of infection. The lowest concentration of SARS-CoV-2 viral copies this assay can detect is 138 copies/mL. A negative result does not preclude SARS-Cov-2 infection and should not be used as the sole basis for treatment or other patient management decisions. A negative result may occur with  improper specimen collection/handling, submission of specimen other than nasopharyngeal swab, presence of viral mutation(s) within the areas targeted by this assay, and inadequate number of viral copies(<138 copies/mL). A negative result must be combined with clinical observations, patient history, and epidemiological information. The expected result is Negative.  Fact Sheet for Patients:  EntrepreneurPulse.com.au  Fact Sheet for Healthcare Providers:  IncredibleEmployment.be  This test is no t yet approved or cleared by the [place] FDA and  has been authorized for detection and/or diagnosis of SARS-CoV-2 by FDA under an Emergency Use Authorization (EUA). This EUA will remain  in effect (meaning this test can be used) for the duration of the COVID-19 declaration under Section 564(b)(1) of the Act, 21 U.S.C.section 360bbb-3(b)(1), unless the authorization is terminated  or revoked sooner.       Influenza A by PCR NEGATIVE NEGATIVE Final   Influenza B by PCR NEGATIVE NEGATIVE Final    Comment: (NOTE) The Xpert Xpress SARS-CoV-2/FLU/RSV plus assay is intended as an aid in the diagnosis of influenza from Nasopharyngeal swab specimens  and should not be used as a sole basis for treatment. Nasal washings and aspirates are unacceptable for Xpert Xpress SARS-CoV-2/FLU/RSV testing.  Fact Sheet for Patients: EntrepreneurPulse.com.au  Fact Sheet for Healthcare Providers: IncredibleEmployment.be  This test is not yet approved or cleared by the [place] FDA and has been authorized for detection and/or diagnosis of SARS-CoV-2 by FDA under an Emergency Use Authorization (EUA). This EUA will remain in effect (meaning this test can be used) for the duration of the COVID-19 declaration under Section 564(b)(1) of the Act, 21 U.S.C. section 360bbb-3(b)(1), unless the authorization is terminated or revoked.  Performed at [facility], [address], [place], [place] [address]      Time coordinating discharge: 35 minutes  SIGNED:   Rodena Goldmann, DO [facility] 02/14/2021, 12:03 PM  If 7PM-7AM, please contact night-coverage [email or web address]

## 2021-02-14 NOTE — Progress Notes (Signed)
[name], M.D. Gastroenterology & Hepatology   Interval History:  Patient denies having any complaints at the moment.No acute events overnight. Underwent EGD yesterday, was found to have grade 2 and grade 3 esophageal varices which were banded x3.  He was also found to have superficial oozing ulcer in the gastric body which was clipped x2 for hemostasis.  Also was found to have presence of a patent healthy Billroth II anastomosis. Labs today have showed stable hemoglobin of 9.3 with otherwise stable labs.  Inpatient Medications:  Current Facility-Administered Medications:    carvedilol (COREG) tablet 6.25 mg, 6.25 mg, Oral, BID WC, Elgergawy, Silver Huguenin, MD, 6.25 mg at 02/14/21 0904   insulin aspart (novoLOG) injection 0-5 Units, 0-5 Units, Subcutaneous, QHS, Elgergawy, Silver Huguenin, MD   insulin aspart (novoLOG) injection 0-9 Units, 0-9 Units, Subcutaneous, TID WC, Elgergawy, Silver Huguenin, MD, 1 Units at 02/14/21 0904   levothyroxine (SYNTHROID) tablet 137 mcg, 137 mcg, Oral, QAC breakfast, Elgergawy, Silver Huguenin, MD, 137 mcg at 02/14/21 0549   pantoprazole (PROTONIX) EC tablet 40 mg, 40 mg, Oral, BID, Manuella Ghazi, Pratik D, DO, 40 mg at 02/14/21 1115   I/O    Intake/Output Summary (Last 24 hours) at 02/14/2021 1028 Last data filed at 02/14/2021 0900 Gross per 24 hour  Intake 800 ml  Output --  Net 800 ml     Physical Exam: Temp:  [97.5 F (36.4 C)-98 F (36.7 C)] 97.6 F (36.4 C) (11/27 0430) Pulse Rate:  [80-115] 104 (11/27 0430) Resp:  [19-20] 19 (11/27 0430) BP: (95-121)/(49-79) 121/76 (11/27 0430) SpO2:  [100 %] 100 % (11/27 0430)  Temp (24hrs), Avg:97.7 F (36.5 C), Min:97.5 F (36.4 C), Max:98 F (36.7 C) GENERAL: The patient is AO x3, in no acute distress. HEENT: Head is normocephalic and atraumatic. EOMI are intact. Mouth is well hydrated and without lesions. NECK: Supple. No masses LUNGS: Clear to auscultation. No presence of rhonchi/wheezing/rales. Adequate chest  expansion HEART: RRR, normal s1 and s2. ABDOMEN: Soft, nontender, no guarding, no peritoneal signs, and nondistended. BS +. No masses. EXTREMITIES: Without any cyanosis, clubbing, rash, lesions or edema. NEUROLOGIC: AOx3, no focal motor deficit. SKIN: no jaundice, no rashes  Laboratory Data: CBC:     Component Value Date/Time   WBC 3.5 (L) 02/14/2021 0305   RBC 3.10 (L) 02/14/2021 0305   HGB 9.3 (L) 02/14/2021 0305   HCT 30.6 (L) 02/14/2021 0305   PLT 107 (L) 02/14/2021 0305   MCV 98.7 02/14/2021 0305   MCH 30.0 02/14/2021 0305   MCHC 30.4 02/14/2021 0305   RDW 15.9 (H) 02/14/2021 0305   LYMPHSABS 0.8 02/12/2021 1439   MONOABS 0.5 02/12/2021 1439   EOSABS 0.0 02/12/2021 1439   BASOSABS 0.0 02/12/2021 1439   COAG:  Lab Results  Component Value Date   INR 1.4 (H) 02/13/2021   INR 1.3 (H) 02/12/2021   INR 1.2 (H) 01/28/2021    BMP:  BMP Latest Ref Rng & Units 02/14/2021 02/13/2021 02/12/2021  Glucose 70 - 99 mg/dL 139(H) 144(H) 225(H)  BUN 8 - 23 mg/dL 20 25(H) 27(H)  Creatinine 0.61 - 1.24 mg/dL 1.25(H) 1.33(H) 1.63(H)  BUN/Creat Ratio 6 - 22 (calc) - - -  Sodium 135 - 145 mmol/L 137 138 136  Potassium 3.5 - 5.1 mmol/L 5.1 4.8 4.7  Chloride 98 - 111 mmol/L 106 108 107  CO2 22 - 32 mmol/L _0 Calcium 8.9 - 10.3 mg/dL 8.8(L) 8.3(L) 8.4(L)    HEPATIC:  Hepatic Function  Latest Ref Rng & Units 02/13/2021 02/12/2021 05/25/2020  Total Protein 6.5 - 8.1 g/dL 6.7 7.7 8.0  Albumin 3.5 - 5.0 g/dL 2.6(L) 3.1(L) 3.4(L)  AST 15 - 41 U/L _0 ALT 0 - 44 U/L _1 Alk Phosphatase 38 - 126 U/L 92 113 81  Total Bilirubin 0.3 - 1.2 mg/dL 0.7 0.7 1.4(H)    CARDIAC:  Lab Results  Component Value Date   TROPONINI <0.03 10/24/2016      Imaging: I personally reviewed and interpreted the available labs, imaging and endoscopic files.   Assessment/Plan: 80 y.o. 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  Patient had confirmation of presence of new thrombosis in his main portal vein with extension to the left branch of the portal vein, but with presence of patent splenic, inferior mesenteric and superior mesenteric vein.  There was also presence of chronic large left retroperitoneal splenorenal shunt.  He is not presenting any worsening ascites at the moment. As there is patency of the vessels, he is not presenting any abdominal concerning for significant venous congestion and enteropathy.  The patient underwent an EGD which showed presence of grade 2 and grade 3 esophageal varices which were banded x3.  He was also found to have superficial oozing ulcer in the gastric body which was clipped x2 for hemostasis.  Also was found to have presence of a patent healthy Billroth II anastomosis.given these findings, he is a poor candidate for systemic anticoagulation given his high risk of bleeding on top of his underlying coagulopathy.  He will need to have a repeat EGD in 4 weeks to survey the treatment response to esophageal varix banding.  I discussed his case with Dr. Pascal Lux.  We both agree that the patient needs to have a repeat CT scan of the abdomen and pelvis with IV contrast in a month to assess extension of the thrombosis.  He will reach me regarding discussion with our interventional radiologists regarding next steps if the thrombosis worsens.   As he has presented intermittent episodes of tachycardia due to his A. fib, this may be further controlled by increasing his Coreg to 12.5 mg twice a day.  If there is concern for  borderline blood pressure, his ACE inhibitor can be stopped.  He will need to restart his diuretics at home as previous.   The patient is up-to-date regarding his [place] screening.  Is not presenting  any signs of encephalopathy and has not presented any significant ascites.   - Repeat CBC qday, transfuse if Hb <7 - 2 large bore IV lines - Active T/S -Advance diet to low-sodium heart healthy diet - Avoid NSAIDs -Repeat EGD in 4 weeks -Follow-up gastric biopsies result - Repeat CT abdomen and pelvis with IV contrast in 1 month - Increase Coreg to 12.5 mg twice a day - Continue spironolactone 50 mg qday and furosemide 20 mg qday  [name], MD Gastroenterology and Hepatology [facility]

## 2021-02-15 ENCOUNTER — Telehealth (INDEPENDENT_AMBULATORY_CARE_PROVIDER_SITE_OTHER): Payer: Self-pay | Admitting: *Deleted

## 2021-02-15 LAB — HEMOGLOBIN A1C
Hgb A1c MFr Bld: 6.8 % — ABNORMAL HIGH (ref 4.8–5.6)
Mean Plasma Glucose: 148 mg/dL

## 2021-02-15 LAB — GLUCOSE, CAPILLARY
Glucose-Capillary: 140 mg/dL — ABNORMAL HIGH (ref 70–99)
Glucose-Capillary: 128 mg/dL — ABNORMAL HIGH (ref 70–99)

## 2021-02-15 NOTE — Telephone Encounter (Signed)
Pt has two different doses of coreg at home. 6.72m and 12.517m wanted to know which dose to take. Pt was discharged home from hospital on 12.5. called and discussed with pt. Pt verbalized understanding.

## 2021-02-16 ENCOUNTER — Encounter (HOSPITAL_COMMUNITY): Payer: Self-pay | Admitting: Gastroenterology

## 2021-02-16 ENCOUNTER — Ambulatory Visit (HOSPITAL_COMMUNITY): Admission: RE | Admit: 2021-02-16 | Payer: Medicare Other | Source: Home / Self Care | Admitting: Gastroenterology

## 2021-02-16 ENCOUNTER — Encounter (HOSPITAL_COMMUNITY): Admission: RE | Payer: Self-pay | Source: Home / Self Care

## 2021-02-16 LAB — SURGICAL PATHOLOGY

## 2021-02-16 SURGERY — ESOPHAGOGASTRODUODENOSCOPY (EGD) WITH PROPOFOL
Anesthesia: Monitor Anesthesia Care

## 2021-02-17 ENCOUNTER — Telehealth (INDEPENDENT_AMBULATORY_CARE_PROVIDER_SITE_OTHER): Payer: Self-pay

## 2021-02-17 ENCOUNTER — Other Ambulatory Visit (INDEPENDENT_AMBULATORY_CARE_PROVIDER_SITE_OTHER): Payer: Self-pay

## 2021-02-17 NOTE — Telephone Encounter (Signed)
Thanks, that's correct

## 2021-02-17 NOTE — Telephone Encounter (Signed)
Patient wife called and was questioning whether or not the patient needed to stay on Pantoprazole when he has no heartburn. I called the patient back and informed he and his wife that yes they need to stay on it as it is to help with the gastric ulcer he has and also to help prevent more. He also has varices and this will help also. They state understanding.

## 2021-02-18 ENCOUNTER — Other Ambulatory Visit: Payer: Self-pay

## 2021-02-18 ENCOUNTER — Encounter (INDEPENDENT_AMBULATORY_CARE_PROVIDER_SITE_OTHER): Payer: Self-pay | Admitting: Gastroenterology

## 2021-02-18 ENCOUNTER — Ambulatory Visit (INDEPENDENT_AMBULATORY_CARE_PROVIDER_SITE_OTHER): Payer: Medicare Other | Admitting: Gastroenterology

## 2021-02-18 ENCOUNTER — Encounter (INDEPENDENT_AMBULATORY_CARE_PROVIDER_SITE_OTHER): Payer: Self-pay

## 2021-02-18 ENCOUNTER — Other Ambulatory Visit (INDEPENDENT_AMBULATORY_CARE_PROVIDER_SITE_OTHER): Payer: Self-pay

## 2021-02-18 VITALS — BP 0/0 | Ht 69.0 in | Wt 190.0 lb

## 2021-02-18 DIAGNOSIS — R188 Other ascites: Secondary | ICD-10-CM

## 2021-02-18 DIAGNOSIS — K7460 Unspecified cirrhosis of liver: Secondary | ICD-10-CM | POA: Diagnosis not present

## 2021-02-18 DIAGNOSIS — I81 Portal vein thrombosis: Secondary | ICD-10-CM | POA: Diagnosis not present

## 2021-02-18 DIAGNOSIS — I10 Essential (primary) hypertension: Secondary | ICD-10-CM

## 2021-02-18 DIAGNOSIS — K7682 Hepatic encephalopathy: Secondary | ICD-10-CM

## 2021-02-18 DIAGNOSIS — K259 Gastric ulcer, unspecified as acute or chronic, without hemorrhage or perforation: Secondary | ICD-10-CM

## 2021-02-18 MED ORDER — LACTULOSE 10 G PO PACK: 10.0000 g | PACK | Freq: Two times a day (BID) | ORAL | 1 refills | Status: DC

## 2021-02-18 NOTE — Patient Instructions (Addendum)
Continue carvedilol 12.5 mg twice a day for now Start lactulose, titrate to achieve 2-4 bowel movements per day Continue furosemide 20 mg qday Continue spironolactone 50 mg qday Will reach interventional radiology to schedule TIPS placement a possible partial thrombectomy Please notify us if presenting worsening mental status Cancel EGD

## 2021-02-18 NOTE — Progress Notes (Signed)
Tyler Farmer, M.D. Gastroenterology & Hepatology [facility] [address] [place], [place] [address] Primary Care Physician: Tyler Hilding, MD [place] [id number]  This is a telephone virtual visit.  It required patient-provider interaction for the medical decision making as documented below. The patient has consented and agreed to proceed with a Telehealth encounter.  VIRTUAL VISIT NOTE Patient location: home Provider location: office  I will communicate my assessment and recommendations to the referring MD via EMR.  Problems: NASH cirrhosis Esophageal varices Ascites History of gastric AV malformation status post APC ablation Acute portal vein thrombosis Gastric ulcer  History of Present Illness: Tyler Farmer is a 80 y.o. male with past medical history of Tyler Farmer cirrhosis complicated by nonbleeding grade 2 esophageal varices status post banding, ascites, history of bleeding gastric AVM, peptic ulcer disease status post Billroth II gastrectomy, atrial fibrillation, diabetes, GERD, hyperlipidemia, hypertension, prostate cancer, and acute portal vein thrombosis, who comes for discussion of management of portal vein thrombosis.  Patient reports that after he came home he has been feeling confused regarding his medication.  He has presented some weakness.  He handed the phone to his wife as he was feeling confused.  The wife states that he has been feeling more lethargic after he left the hospital.  Otherwise he has not presented any other complaints. The wife denies having any nausea, vomiting, fever, chills, hematochezia, melena, hematemesis, abdominal distention, abdominal pain, diarrhea, jaundice, pruritus or weight loss.  Last MELD 02/14/2021 - 13  Patient was admitted to the hospital on 02/12/2021 after an MRCP performed for evaluation of the gallbladder showed presence of acute portal vein thrombosis with  possible extension to the SMV and to the IMV.  Due to this the patient was advised to go to the ER for further evaluation.  He underwent a CT of the abdomen and pelvis with IV contrast which showed that the acute thrombosis was present in the portal vein but was not extending to any of the other vessels.  During this hospitalization the patient underwent an EGD with findings described below.  I personally discussed his clinical condition with Dr. Pascal Farmer who considered the patient could be followed with CT of the abdomen in a month.  However, upon discussion with Dr. Serafina Farmer, it was considered to the patient could potentially benefit from TIPS to decrease the blood flow into his portal vein and avoid the progression of his thrombosis and potentially perform a partial thrombectomy versus only monitoring.  Today's appointment is aimed to discuss this options with the wife and the patient.  Most recent TTE was performed on 04/14/2020 which shows an ejection fraction of 60 to 65% with normal right ventricular systolic function and no major valvulopathy's.  Last EGD: 02/13/21 Grade III varices were found in the lower third of the esophagus. Three bands were successfully placed with complete eradication, resulting in deflation of varices. One oozing superficial gastric ulcer with oozing hemorrhage (Forrest Class Ib) was found in the gastric body. The lesion was 4 mm in largest dimension. For hemostasis, two hemostatic clips were successfully placed.  Biopsies of the healthy stomach were taken with a cold forceps for Helicobacter pylori testing. Evidence of a patent Billroth II gastrojejunostomy was found. The gastrojejunal anastomosis was characterized by healthy appearing mucosa. This was traversed. The efferent limb was examined. The afferent limb was examined. Last Colonoscopy: 06/17/2020, congestion in the cecum, diverticulosis in the sigmoid colon, 3 mm  polyp in the sigmoid colon, external hemorrhoids.  Polyp was  resected but not retrieved. Pathology showed reactive gastropathy but negative for H. pylori or dysplasia.  Past Medical History: Past Medical History:  Diagnosis Date   Arthritis    Hands/Fingers   Back pain    Cirrhosis of liver ([place])    Diabetes mellitus ([place])    Enlarged prostate    Gastric ulcer    GERD (gastroesophageal reflux disease)    H/O bladder problems    Hypercholesteremia    Hypertension    Kidney stone    Prostate cancer ([place])    Thyroid disease    UGI bleed 10/26/2016   Vitamin B12 deficiency 10/26/2016    Past Surgical History: Past Surgical History:  Procedure Laterality Date   CIRCUMCISION     at age 61   COLONOSCOPY N/A 11/13/2015   Procedure: COLONOSCOPY;  Surgeon: Rogene Houston, MD;  [facility];  Service: Endoscopy;  Laterality: N/A;  2:10   COLONOSCOPY WITH PROPOFOL N/A 06/17/2020   Procedure: COLONOSCOPY WITH PROPOFOL;  Surgeon: Rogene Houston, MD;  [facility];  Service: Endoscopy;  Laterality: N/A;  AM / patient had positive covid 05/25/20   ESOPHAGEAL BANDING N/A 02/13/2021   Procedure: ESOPHAGEAL BANDING;  Surgeon: Harvel Quale, MD;  [facility];  Service: Gastroenterology;  Laterality: N/A;   ESOPHAGOGASTRODUODENOSCOPY N/A 06/03/2014   Procedure: ESOPHAGOGASTRODUODENOSCOPY (EGD);  Surgeon: Rogene Houston, MD;  [facility];  Service: Endoscopy;  Laterality: N/A;  730   ESOPHAGOGASTRODUODENOSCOPY N/A 10/26/2016   Procedure: ESOPHAGOGASTRODUODENOSCOPY (EGD);  Surgeon: Rogene Houston, MD;  [facility];  Service: Endoscopy;  Laterality: N/A;   ESOPHAGOGASTRODUODENOSCOPY (EGD) WITH PROPOFOL N/A 06/17/2020   Procedure: ESOPHAGOGASTRODUODENOSCOPY (EGD) WITH PROPOFOL;  Surgeon: Rogene Houston, MD;  [facility];  Service: Endoscopy;  Laterality: N/A;   ESOPHAGOGASTRODUODENOSCOPY (EGD) WITH PROPOFOL N/A 02/13/2021   Procedure: ESOPHAGOGASTRODUODENOSCOPY (EGD) WITH  PROPOFOL;  Surgeon: Harvel Quale, MD;  [facility];  Service: Gastroenterology;  Laterality: N/A;   Gastric Ulcer  1993   GIVENS CAPSULE STUDY N/A 10/28/2016   Procedure: GIVENS CAPSULE STUDY;  Surgeon: Rogene Houston, MD;  [facility];  Service: Endoscopy;  Laterality: N/A;   HERNIA REPAIR     POLYPECTOMY  11/13/2015   Procedure: POLYPECTOMY;  Surgeon: Rogene Houston, MD;  [facility];  Service: Endoscopy;;  colon   POLYPECTOMY  06/17/2020   Procedure: POLYPECTOMY;  Surgeon: Rogene Houston, MD;  [facility];  Service: Endoscopy;;  sigmoid   Right Elbow Right    A pin was put in    Family History: Family History  Problem Relation Age of Onset   Emphysema Mother    Cerebrovascular Accident Father    Heart disease Father    Diabetes Sister    Heart disease Brother    Heart disease Sister    Heart disease Brother    Diabetes Brother     Social History: Social History   Tobacco Use  Smoking Status Former   Types: Cigarettes   Quit date: 05/25/1989   Years since quitting: 31.7  Smokeless Tobacco Former   Quit date: 01/16/1990   Social History   Substance and Sexual Activity  Alcohol Use No   Alcohol/week: 0.0 standard drinks   Comment: Drank many years - 30 years ago   Social History   Substance and Sexual Activity  Drug Use No    Allergies: Allergies  Allergen Reactions   Penicillins Nausea And Vomiting    Has patient had a PCN reaction causing immediate rash, facial/tongue/throat swelling, SOB or lightheadedness with hypotension: Yes Has patient had a PCN reaction causing severe rash involving mucus membranes or skin necrosis: No Has patient had a PCN reaction that required hospitalization: No Has patient had a PCN reaction occurring within the last 10 years: No If all of the above answers are "NO", then may proceed with Cephalosporin use.   Patient states that it also caused pain in his sides.     Medications: Current Outpatient Medications  Medication Sig Dispense Refill   acetaminophen (TYLENOL) 500 MG tablet Take 500 mg by mouth every 6 (six) hours as needed.     aspirin EC 81 MG tablet Take 81 mg by mouth daily. Swallow whole.     carvedilol (COREG) 12.5 MG tablet Take 1 tablet (12.5 mg total) by mouth 2 (two) times daily. 60 tablet 11   Cranberry 500 MG TABS Take 500 mg by mouth daily.     dicyclomine (BENTYL) 10 MG capsule Take 10 mg by mouth 2 (two) times daily as needed for spasms.     ferrous sulfate 325 (65 FE) MG tablet Take 325 mg by mouth daily with breakfast.     furosemide (LASIX) 20 MG tablet Take 1 tablet by mouth once daily (Patient taking differently: Take 40 mg by mouth daily.) 90 tablet 1   glipiZIDE (GLUCOTROL) 10 MG tablet Take 10 mg by mouth 2 (two) times daily before a meal.     levothyroxine (SYNTHROID) 150 MCG tablet Take 137 mcg by mouth daily before breakfast.     magnesium oxide (MAG-OX) 400 MG tablet Take 400 mg by mouth daily.     pantoprazole (PROTONIX) 40 MG tablet Take 1 tablet (40 mg total) by mouth 2 (two) times daily. 60 tablet 2   simvastatin (ZOCOR) 40 MG tablet Take 40 mg by mouth at bedtime.      spironolactone (ALDACTONE) 50 MG tablet Take 1 tablet (50 mg total) by mouth daily. 30 tablet 5   No current facility-administered medications for this visit.    Review of Systems: GENERAL: negative for malaise, night sweats HEENT: No changes in hearing or vision, no nose bleeds or other nasal problems. NECK: Negative for lumps, goiter, pain and significant neck swelling RESPIRATORY: Negative for cough, wheezing CARDIOVASCULAR: Negative for chest pain, leg swelling, palpitations, orthopnea GI: SEE HPI MUSCULOSKELETAL: Negative for joint pain or swelling, back pain, and muscle pain. SKIN: Negative for lesions, rash PSYCH: Negative for sleep disturbance, mood disorder and recent psychosocial stressors. HEMATOLOGY Negative for prolonged  bleeding, bruising easily, and swollen nodes. ENDOCRINE: Negative for cold or heat intolerance, polyuria, polydipsia and goiter. NEURO: negative for tremor, gait imbalance, syncope and seizures. The remainder of the review of systems is noncontributory.   Physical Exam: No exam was performed as this was a telephone encounter  Imaging/Labs: as above  I personally reviewed and interpreted the available labs, imaging and endoscopic files.  Impression and Plan: Asheton Scheffler Heskett is a 80 y.o. male with past medical history of Tyler Farmer cirrhosis complicated by nonbleeding grade 2 esophageal varices status post banding, ascites, history of bleeding gastric AVM, peptic ulcer disease status post Billroth II gastrectomy, atrial fibrillation, diabetes, GERD, hyperlipidemia, hypertension, prostate cancer, and acute portal vein thrombosis, who comes for discussion of management of portal vein thrombosis.  The patient has a complex  disease in his portal vein.  I had a previous discussion with both Dr. Serafina Farmer and Dr. Pascal Farmer regarding the possible interventions that could be performed to avoid further progression of his thrombosis.  They both agreed that given his history of gastric ulcers and esophageal varices, he is not a candidate for anticoagulation.  However, he could potentially benefit from having a small-diameter TIPS placement and possible partial thrombectomy, with the caveat that this may lead to encephalopathy.  Also, he has a meld score of less than 15 but given his age his liver cannot get potentially affected by the TIPS placement.  I discussed this with the patient's wife thoroughly for close to 50 minutes, going through the details of the procedure the possible risks and complications.  She is stated that she wanted him to have any intervention that could potentially help decrease the clot progression however this we spoke about the possibility of having intestinal infarct and need for surgery if the  clot were to progress).  Due to this, she would like to proceed with the referral for evaluation by interventional radiology.    Importantly, I wonder if the patient is presenting worsening new onset encephalopathy as he has felt more confused after leaving the hospital.  If he were to have more of the symptoms he should come to the ER.  For now I will prescribe him lactulose to achieve a goal of 2-3 bowel movements per day.  He has not present any worsening ascites so he should continue on his same dose of furosemide 20 mg every day and spironolactone 50 mg every day and he should continue with carvedilol 12.5 mg twice a day.  We will cancel his esophagogastroduodenospy as he has not present any other symptoms of recurrent bleeding clinically and he will proceed with TIPS so he will not need any further banding.  - Continue carvedilol 12.5 mg twice a day for now - Start lactulose, titrate to achieve 2-4 bowel movements per day - Continue furosemide 20 mg qday - Continue spironolactone 50 mg qday - Will reach interventional radiology to schedule TIPS placement a possible partial thrombectomy - Cancel EGD  All questions were answered.      Total visit time: I spent a total of  50 minutes  Tyler Peppers, MD Gastroenterology and Hepatology [facility]

## 2021-02-19 ENCOUNTER — Inpatient Hospital Stay (HOSPITAL_COMMUNITY)
Admission: EM | Admit: 2021-02-19 | Discharge: 2021-02-21 | DRG: 441 | Disposition: A | Discharge status: Home / Self Care | Payer: Medicare Other | Attending: Family Medicine | Admitting: Family Medicine

## 2021-02-19 ENCOUNTER — Emergency Department (HOSPITAL_COMMUNITY): Payer: Medicare Other

## 2021-02-19 ENCOUNTER — Encounter (HOSPITAL_COMMUNITY): Payer: Self-pay | Admitting: *Deleted

## 2021-02-19 ENCOUNTER — Encounter (INDEPENDENT_AMBULATORY_CARE_PROVIDER_SITE_OTHER): Payer: Self-pay

## 2021-02-19 ENCOUNTER — Other Ambulatory Visit: Payer: Self-pay

## 2021-02-19 DIAGNOSIS — E119 Type 2 diabetes mellitus without complications: Secondary | ICD-10-CM

## 2021-02-19 DIAGNOSIS — N400 Enlarged prostate without lower urinary tract symptoms: Secondary | ICD-10-CM | POA: Diagnosis present

## 2021-02-19 DIAGNOSIS — Z880 Allergy status to penicillin: Secondary | ICD-10-CM

## 2021-02-19 DIAGNOSIS — K7682 Hepatic encephalopathy: Secondary | ICD-10-CM | POA: Insufficient documentation

## 2021-02-19 DIAGNOSIS — Z833 Family history of diabetes mellitus: Secondary | ICD-10-CM | POA: Diagnosis not present

## 2021-02-19 DIAGNOSIS — W19XXXA Unspecified fall, initial encounter: Secondary | ICD-10-CM | POA: Diagnosis present

## 2021-02-19 DIAGNOSIS — N179 Acute kidney failure, unspecified: Secondary | ICD-10-CM | POA: Diagnosis not present

## 2021-02-19 DIAGNOSIS — K219 Gastro-esophageal reflux disease without esophagitis: Secondary | ICD-10-CM | POA: Diagnosis not present

## 2021-02-19 DIAGNOSIS — Z7984 Long term (current) use of oral hypoglycemic drugs: Secondary | ICD-10-CM

## 2021-02-19 DIAGNOSIS — Z87442 Personal history of urinary calculi: Secondary | ICD-10-CM | POA: Diagnosis not present

## 2021-02-19 DIAGNOSIS — I8510 Secondary esophageal varices without bleeding: Secondary | ICD-10-CM | POA: Diagnosis present

## 2021-02-19 DIAGNOSIS — I81 Portal vein thrombosis: Secondary | ICD-10-CM | POA: Diagnosis present

## 2021-02-19 DIAGNOSIS — E860 Dehydration: Secondary | ICD-10-CM | POA: Diagnosis not present

## 2021-02-19 DIAGNOSIS — Z825 Family history of asthma and other chronic lower respiratory diseases: Secondary | ICD-10-CM

## 2021-02-19 DIAGNOSIS — J449 Chronic obstructive pulmonary disease, unspecified: Secondary | ICD-10-CM | POA: Diagnosis not present

## 2021-02-19 DIAGNOSIS — K7200 Acute and subacute hepatic failure without coma: Principal | ICD-10-CM | POA: Diagnosis present

## 2021-02-19 DIAGNOSIS — Z20822 Contact with and (suspected) exposure to covid-19: Secondary | ICD-10-CM | POA: Diagnosis not present

## 2021-02-19 DIAGNOSIS — R413 Other amnesia: Secondary | ICD-10-CM | POA: Diagnosis not present

## 2021-02-19 DIAGNOSIS — Z8249 Family history of ischemic heart disease and other diseases of the circulatory system: Secondary | ICD-10-CM

## 2021-02-19 DIAGNOSIS — E7800 Pure hypercholesterolemia, unspecified: Secondary | ICD-10-CM | POA: Diagnosis not present

## 2021-02-19 DIAGNOSIS — M1990 Unspecified osteoarthritis, unspecified site: Secondary | ICD-10-CM | POA: Diagnosis present

## 2021-02-19 DIAGNOSIS — R4182 Altered mental status, unspecified: Secondary | ICD-10-CM

## 2021-02-19 DIAGNOSIS — I10 Essential (primary) hypertension: Secondary | ICD-10-CM | POA: Diagnosis not present

## 2021-02-19 DIAGNOSIS — R0902 Hypoxemia: Secondary | ICD-10-CM | POA: Diagnosis not present

## 2021-02-19 DIAGNOSIS — E039 Hypothyroidism, unspecified: Secondary | ICD-10-CM | POA: Diagnosis not present

## 2021-02-19 DIAGNOSIS — M2578 Osteophyte, vertebrae: Secondary | ICD-10-CM | POA: Diagnosis not present

## 2021-02-19 DIAGNOSIS — R188 Other ascites: Secondary | ICD-10-CM | POA: Diagnosis not present

## 2021-02-19 DIAGNOSIS — Z8546 Personal history of malignant neoplasm of prostate: Secondary | ICD-10-CM

## 2021-02-19 DIAGNOSIS — R410 Disorientation, unspecified: Secondary | ICD-10-CM | POA: Diagnosis not present

## 2021-02-19 DIAGNOSIS — Z87891 Personal history of nicotine dependence: Secondary | ICD-10-CM

## 2021-02-19 DIAGNOSIS — K7290 Hepatic failure, unspecified without coma: Secondary | ICD-10-CM | POA: Diagnosis not present

## 2021-02-19 DIAGNOSIS — I4891 Unspecified atrial fibrillation: Secondary | ICD-10-CM | POA: Diagnosis not present

## 2021-02-19 DIAGNOSIS — Z86010 Personal history of colonic polyps: Secondary | ICD-10-CM

## 2021-02-19 DIAGNOSIS — Z7982 Long term (current) use of aspirin: Secondary | ICD-10-CM

## 2021-02-19 DIAGNOSIS — Z79899 Other long term (current) drug therapy: Secondary | ICD-10-CM

## 2021-02-19 DIAGNOSIS — N183 Chronic kidney disease, stage 3 unspecified: Secondary | ICD-10-CM | POA: Diagnosis not present

## 2021-02-19 DIAGNOSIS — S0990XA Unspecified injury of head, initial encounter: Secondary | ICD-10-CM | POA: Diagnosis not present

## 2021-02-19 DIAGNOSIS — K7581 Nonalcoholic steatohepatitis (NASH): Secondary | ICD-10-CM | POA: Diagnosis present

## 2021-02-19 DIAGNOSIS — K7460 Unspecified cirrhosis of liver: Secondary | ICD-10-CM | POA: Diagnosis present

## 2021-02-19 DIAGNOSIS — D61818 Other pancytopenia: Secondary | ICD-10-CM | POA: Diagnosis present

## 2021-02-19 DIAGNOSIS — E538 Deficiency of other specified B group vitamins: Secondary | ICD-10-CM | POA: Diagnosis present

## 2021-02-19 DIAGNOSIS — E1165 Type 2 diabetes mellitus with hyperglycemia: Secondary | ICD-10-CM | POA: Diagnosis not present

## 2021-02-19 DIAGNOSIS — K922 Gastrointestinal hemorrhage, unspecified: Secondary | ICD-10-CM | POA: Diagnosis present

## 2021-02-19 DIAGNOSIS — I480 Paroxysmal atrial fibrillation: Secondary | ICD-10-CM | POA: Diagnosis not present

## 2021-02-19 DIAGNOSIS — Z79890 Hormone replacement therapy (postmenopausal): Secondary | ICD-10-CM

## 2021-02-19 DIAGNOSIS — M47812 Spondylosis without myelopathy or radiculopathy, cervical region: Secondary | ICD-10-CM | POA: Diagnosis not present

## 2021-02-19 LAB — URINALYSIS, ROUTINE W REFLEX MICROSCOPIC
Bilirubin Urine: NEGATIVE
Glucose, UA: NEGATIVE mg/dL
Hgb urine dipstick: NEGATIVE
Ketones, ur: NEGATIVE mg/dL
Leukocytes,Ua: NEGATIVE
Nitrite: NEGATIVE
Protein, ur: NEGATIVE mg/dL
Specific Gravity, Urine: 1.02 (ref 1.005–1.030)
pH: 6 (ref 5.0–8.0)

## 2021-02-19 LAB — ETHANOL: Alcohol, Ethyl (B): <10 mg/dL (ref <10)

## 2021-02-19 LAB — CBC WITH DIFFERENTIAL/PLATELET
Abs Immature Granulocytes: 0.01 10*3/uL (ref 0.00–0.07)
Basophils Absolute: 0 10*3/uL (ref 0.0–0.1)
Basophils Relative: 1 %
Eosinophils Absolute: 0 10*3/uL (ref 0.0–0.5)
Eosinophils Relative: 0 %
HCT: 30.7 % — ABNORMAL LOW (ref 39.0–52.0)
Hemoglobin: 9.6 g/dL — ABNORMAL LOW (ref 13.0–17.0)
Immature Granulocytes: 0 %
Lymphocytes Relative: 23 %
Lymphs Abs: 0.6 10*3/uL — ABNORMAL LOW (ref 0.7–4.0)
MCH: 30.5 pg (ref 26.0–34.0)
MCHC: 31.3 g/dL (ref 30.0–36.0)
MCV: 97.5 fL (ref 80.0–100.0)
Monocytes Absolute: 0.3 10*3/uL (ref 0.1–1.0)
Monocytes Relative: 12 %
Neutro Abs: 1.7 10*3/uL (ref 1.7–7.7)
Neutrophils Relative %: 64 %
Platelets: 73 10*3/uL — ABNORMAL LOW (ref 150–400)
RBC: 3.15 MIL/uL — ABNORMAL LOW (ref 4.22–5.81)
RDW: 16.4 % — ABNORMAL HIGH (ref 11.5–15.5)
WBC: 2.7 10*3/uL — ABNORMAL LOW (ref 4.0–10.5)
nRBC: 0 % (ref 0.0–0.2)

## 2021-02-19 LAB — COMPREHENSIVE METABOLIC PANEL
ALT: 19 U/L (ref 0–44)
AST: 26 U/L (ref 15–41)
Albumin: 3.2 g/dL — ABNORMAL LOW (ref 3.5–5.0)
Alkaline Phosphatase: 94 U/L (ref 38–126)
Anion gap: 9 (ref 5–15)
BUN: 33 mg/dL — ABNORMAL HIGH (ref 8–23)
CO2: 22 mmol/L (ref 22–32)
Calcium: 8.6 mg/dL — ABNORMAL LOW (ref 8.9–10.3)
Chloride: 106 mmol/L (ref 98–111)
Creatinine, Ser: 2.26 mg/dL — ABNORMAL HIGH (ref 0.61–1.24)
GFR, Estimated: 29 mL/min — ABNORMAL LOW (ref >60)
Glucose, Bld: 129 mg/dL — ABNORMAL HIGH (ref 70–99)
Potassium: 4.7 mmol/L (ref 3.5–5.1)
Sodium: 137 mmol/L (ref 135–145)
Total Bilirubin: 1.2 mg/dL (ref 0.3–1.2)
Total Protein: 7.8 g/dL (ref 6.5–8.1)

## 2021-02-19 LAB — PROTIME-INR
INR: 1.4 — ABNORMAL HIGH (ref 0.8–1.2)
Prothrombin Time: 17.4 seconds — ABNORMAL HIGH (ref 11.4–15.2)

## 2021-02-19 LAB — RESP PANEL BY RT-PCR (FLU A&B, COVID) ARPGX2
Influenza A by PCR: NEGATIVE
Influenza B by PCR: NEGATIVE
SARS Coronavirus 2 by RT PCR: NEGATIVE

## 2021-02-19 LAB — AMMONIA: Ammonia: 120 umol/L — ABNORMAL HIGH (ref 9–35)

## 2021-02-19 MED ORDER — LACTULOSE 10 GM/15ML PO SOLN
30.0000 g | Freq: Three times a day (TID) | ORAL | Status: DC
  Administered 2021-02-19 – 2021-02-21 (×5): 30 g via ORAL
  Filled 2021-02-19 (×5): qty 60

## 2021-02-19 MED ORDER — SODIUM CHLORIDE 0.9 % IV BOLUS
500.0000 mL | Freq: Once | INTRAVENOUS | Status: AC
  Administered 2021-02-19: 500 mL via INTRAVENOUS

## 2021-02-19 MED ORDER — LACTULOSE 10 GM/15ML PO SOLN
30.0000 g | Freq: Once | ORAL | Status: AC
  Administered 2021-02-19: 30 g via ORAL
  Filled 2021-02-19: qty 60

## 2021-02-19 MED ORDER — SODIUM CHLORIDE 0.9 % IV SOLN: INTRAVENOUS | Status: DC

## 2021-02-19 NOTE — ED Notes (Signed)
Pt has calmed down, back in bed, connected to cardiac monitor, daughter at bedside.

## 2021-02-19 NOTE — ED Triage Notes (Signed)
Referred to the ED from his PCP. Spouse states he is weak and confused

## 2021-02-19 NOTE — H&P (Signed)
History and Physical  Tyler Farmer CZY:[id number] DOB: October 01, 1940 DOA: 02/19/2021  Referring physician: Dr Eulis Foster, ED physician PCP: [name], MD  Outpatient Specialists: Dr  [facility]  Patient Coming From: home  Chief Complaint: confusion  HPI: Tyler Farmer is a 80 y.o. male with a history of GERD, diabetes, hypertension, cirrhosis of the liver, thyroid disease, paroxysmal atrial fibrillation with prior use of Eliquis.  Patient recently diagnosed with portal vein thrombosis and was admitted from 11/25 until 11/27.  As the patient had a upper GI bleed, the patient was considered a poor candidate for anticoagulation and is being considered for TIPS procedure.  At the patient is confused, history is obtained by the patient's wife.  Patient has been confused since Wednesday and has been increasingly confused.  No palliating or provoking factors.  Patient had been prescribed lactulose and was instructed to titrate the medication.  They picked up the prescription, but the wife is uncertain as to the actual medication that the patient has been taking.  The patient confusion has extended to being confused about where he is events, etc.  Emergency Department Course: Ammonia 120, creatinine 2.26, UA normal, alcohol normal.  CT head and neck pending  Review of Systems:  Unable to assess due to patient's confusion  Past Medical History:  Diagnosis Date   Arthritis    Hands/Fingers   Back pain    Cirrhosis of liver (HCC)    Diabetes mellitus ([place])    Enlarged prostate    Gastric ulcer    GERD (gastroesophageal reflux disease)    H/O bladder problems    Hypercholesteremia    Hypertension    Kidney stone    Prostate cancer ([place])    Thyroid disease    UGI bleed 10/26/2016   Vitamin B12 deficiency 10/26/2016   Past Surgical History:  Procedure Laterality Date   CIRCUMCISION     at age 34   COLONOSCOPY N/A 11/13/2015   Procedure: COLONOSCOPY;  Surgeon: Rogene Houston, MD;   [facility];  Service: Endoscopy;  Laterality: N/A;  2:10   COLONOSCOPY WITH PROPOFOL N/A 06/17/2020   Procedure: COLONOSCOPY WITH PROPOFOL;  Surgeon: Rogene Houston, MD;  [facility];  Service: Endoscopy;  Laterality: N/A;  AM / patient had positive covid 05/25/20   ESOPHAGEAL BANDING N/A 02/13/2021   Procedure: ESOPHAGEAL BANDING;  Surgeon: Harvel Quale, MD;  [facility];  Service: Gastroenterology;  Laterality: N/A;   ESOPHAGOGASTRODUODENOSCOPY N/A 06/03/2014   Procedure: ESOPHAGOGASTRODUODENOSCOPY (EGD);  Surgeon: Rogene Houston, MD;  [facility];  Service: Endoscopy;  Laterality: N/A;  730   ESOPHAGOGASTRODUODENOSCOPY N/A 10/26/2016   Procedure: ESOPHAGOGASTRODUODENOSCOPY (EGD);  Surgeon: Rogene Houston, MD;  [facility];  Service: Endoscopy;  Laterality: N/A;   ESOPHAGOGASTRODUODENOSCOPY (EGD) WITH PROPOFOL N/A 06/17/2020   Procedure: ESOPHAGOGASTRODUODENOSCOPY (EGD) WITH PROPOFOL;  Surgeon: Rogene Houston, MD;  [facility];  Service: Endoscopy;  Laterality: N/A;   ESOPHAGOGASTRODUODENOSCOPY (EGD) WITH PROPOFOL N/A 02/13/2021   Procedure: ESOPHAGOGASTRODUODENOSCOPY (EGD) WITH PROPOFOL;  Surgeon: Harvel Quale, MD;  [facility];  Service: Gastroenterology;  Laterality: N/A;   Gastric Ulcer  1993   GIVENS CAPSULE STUDY N/A 10/28/2016   Procedure: GIVENS CAPSULE STUDY;  Surgeon: Rogene Houston, MD;  [facility];  Service: Endoscopy;  Laterality: N/A;   HERNIA REPAIR     POLYPECTOMY  11/13/2015   Procedure: POLYPECTOMY;  Surgeon: Bernadene Person  Gloriann Loan, MD;  [facility];  Service: Endoscopy;;  colon   POLYPECTOMY  06/17/2020   Procedure: POLYPECTOMY;  Surgeon: Rogene Houston, MD;  [facility];  Service: Endoscopy;;  sigmoid   Right Elbow Right    A pin was put in   Social History:  reports that he quit smoking about 31 years ago. His smoking use  included cigarettes. He quit smokeless tobacco use about 31 years ago. He reports that he does not drink alcohol and does not use drugs. Patient lives at home  Allergies  Allergen Reactions   Penicillins Nausea And Vomiting    Has patient had a PCN reaction causing immediate rash, facial/tongue/throat swelling, SOB or lightheadedness with hypotension: Yes Has patient had a PCN reaction causing severe rash involving mucus membranes or skin necrosis: No Has patient had a PCN reaction that required hospitalization: No Has patient had a PCN reaction occurring within the last 10 years: No If all of the above answers are "NO", then may proceed with Cephalosporin use.   Patient states that it also caused pain in his sides.    Family History  Problem Relation Age of Onset   Emphysema Mother    Cerebrovascular Accident Father    Heart disease Father    Diabetes Sister    Heart disease Brother    Heart disease Sister    Heart disease Brother    Diabetes Brother       Prior to Admission medications   Medication Sig Start Date End Date Taking? Authorizing Provider  acetaminophen (TYLENOL) 500 MG tablet Take 500 mg by mouth every 6 (six) hours as needed.   Yes [provider]  aspirin EC 81 MG tablet Take 81 mg by mouth daily. Swallow whole.   Yes [provider]  carvedilol (COREG) 12.5 MG tablet Take 1 tablet (12.5 mg total) by mouth 2 (two) times daily. 02/14/21 02/14/22 Yes Shah, Pratik D, DO  Cranberry 500 MG TABS Take 500 mg by mouth daily.   Yes [provider]  dicyclomine (BENTYL) 10 MG capsule Take 10 mg by mouth 2 (two) times daily as needed for spasms. 07/14/20  Yes Rehman, Mechele Dawley, MD  ferrous sulfate 325 (65 FE) MG tablet Take 325 mg by mouth daily with breakfast.   Yes [provider]  furosemide (LASIX) 20 MG tablet Take 1 tablet by mouth once daily Patient taking differently: Take 40 mg by mouth daily. 10/19/20  Yes Branch, Alphonse Guild, MD   glipiZIDE (GLUCOTROL) 10 MG tablet Take 10 mg by mouth 2 (two) times daily before a meal.   Yes [provider]  lactulose (CEPHULAC) 10 g packet Take 1 packet (10 g total) by mouth 2 (two) times daily. Titrate to achieve 2-4 bowel movements per day 02/18/21  Yes Montez Morita, Quillian Quince, MD  levothyroxine (SYNTHROID) 150 MCG tablet Take 137 mcg by mouth daily before breakfast.   Yes [provider]  magnesium oxide (MAG-OX) 400 MG tablet Take 400 mg by mouth daily.   Yes [provider]  pantoprazole (PROTONIX) 40 MG tablet Take 1 tablet (40 mg total) by mouth 2 (two) times daily. 02/14/21 03/16/21 Yes Shah, Pratik D, DO  simvastatin (ZOCOR) 40 MG tablet Take 40 mg by mouth at bedtime.    Yes [provider]  spironolactone (ALDACTONE) 50 MG tablet Take 1 tablet (50 mg total) by mouth daily. 05/10/20  Yes Rogene Houston, MD    Physical Exam:  BP 119/81   Pulse 91   Temp (!) 97.5 F (36.4 C)   Resp 19   SpO2 99%   General: Elderly male. Awake and alert and oriented x2. No acute cardiopulmonary distress.  HEENT: Normocephalic atraumatic.  Right and left ears normal in appearance.  Pupils equal, round, reactive to light. Extraocular muscles are intact. Sclerae anicteric and noninjected.  Moist mucosal membranes. No mucosal lesions.  Neck: Neck supple without lymphadenopathy. No carotid bruits. No masses palpated.  Cardiovascular: Regular rate with normal S1-S2 sounds. No murmurs, rubs, gallops auscultated. No JVD.  Respiratory: Good respiratory effort with no wheezes, rales, rhonchi. Lungs clear to auscultation bilaterally.  No accessory muscle use. Abdomen: Soft, nontender, nondistended. Active bowel sounds. No masses or hepatosplenomegaly  Skin: No rashes, lesions, or ulcerations.  Dry, warm to touch. 2+ dorsalis pedis and radial pulses. Musculoskeletal: No calf or leg pain. All major joints not erythematous nontender.  No upper or lower joint  deformation.  Good ROM.  No contractures  Psychiatric: Impaired judgment.  Patient pleasant and fairly cooperative. Neurologic: No focal neurological deficits. Strength is 5/5 and symmetric in upper and lower extremities.  Cranial nerves II through XII are grossly intact.           Labs on Admission: I have personally reviewed following labs and imaging studies  CBC: Recent Labs  Lab 02/13/21 0442 02/14/21 0305 02/19/21 1508  WBC 3.2* 3.5* 2.7*  NEUTROABS  --   --  1.7  HGB 8.6* 9.3* 9.6*  HCT 28.3* 30.6* 30.7*  MCV 98.3 98.7 97.5  PLT 102* 107* 73*   Basic Metabolic Panel: Recent Labs  Lab 02/13/21 0442 02/14/21 0305 02/19/21 1508  NA 138 137 137  K 4.8 5.1 4.7  CL 108 106 106  CO2 24 24 22   GLUCOSE 144* 139* 129*  BUN 25* 20 33*  CREATININE 1.33* 1.25* 2.26*  CALCIUM 8.3* 8.8* 8.6*  MG  --  1.6*  --    GFR: Estimated Creatinine Clearance: 28.4 mL/min (A) (by C-G formula based on SCr of 2.26 mg/dL (H)). Liver Function Tests: Recent Labs  Lab 02/13/21 0442 02/19/21 1508  AST 25 26  ALT 17 19  ALKPHOS 92 94  BILITOT 0.7 1.2  PROT 6.7 7.8  ALBUMIN 2.6* 3.2*   No results for input(s): LIPASE, AMYLASE in the last 168 hours. Recent Labs  Lab 02/19/21 1509  AMMONIA 120*   Coagulation Profile: Recent Labs  Lab 02/13/21 0442 02/19/21 1508  INR 1.4* 1.4*   Cardiac Enzymes: No results for input(s): CKTOTAL, CKMB, CKMBINDEX, TROPONINI in the last 168 hours. BNP (last 3 results) No results for input(s): PROBNP in the last 8760 hours. HbA1C: No results for input(s): HGBA1C in the last 72 hours. CBG: Recent Labs  Lab 02/13/21 1111 02/13/21 1649 02/13/21 2029 02/14/21 0726 02/14/21 1126  GLUCAP 155* 154* 141* 147* 192*   Lipid Profile: No results for input(s): CHOL, HDL, LDLCALC, TRIG, CHOLHDL, LDLDIRECT in the last 72 hours. Thyroid Function Tests: No results for input(s): TSH, T4TOTAL, FREET4, T3FREE, THYROIDAB in the last 72 hours. Anemia  Panel: No results for input(s): VITAMINB12, FOLATE, FERRITIN, TIBC, IRON, RETICCTPCT in the last 72 hours. Urine analysis:    Component Value Date/Time   COLORURINE YELLOW 10/25/2016 0500   APPEARANCEUR Hazy (A) 11/19/2020 1342   LABSPEC 1.025 10/25/2016 0500   PHURINE 5.5 10/25/2016 0500   GLUCOSEU Negative 11/19/2020 1342   HGBUR NEGATIVE 10/25/2016 0500   BILIRUBINUR Negative 11/19/2020 1342  KETONESUR NEGATIVE 10/25/2016 0500   PROTEINUR Negative 11/19/2020 1342   PROTEINUR NEGATIVE 10/25/2016 0500   NITRITE Negative 11/19/2020 1342   NITRITE NEGATIVE 10/25/2016 0500   LEUKOCYTESUR Negative 11/19/2020 1342   Sepsis Labs: @LABRCNTIP (procalcitonin:4,lacticidven:4) ) Recent Results (from the past 240 hour(s))  Resp Panel by RT-PCR (Flu A&B, Covid) Nasopharyngeal Swab     Status: None   Collection Time: 02/12/21  3:16 PM   Specimen: Nasopharyngeal Swab; Nasopharyngeal(NP) swabs in vial transport medium  Result Value Ref Range Status   SARS Coronavirus 2 by RT PCR NEGATIVE NEGATIVE Final    Comment: (NOTE) SARS-CoV-2 target nucleic acids are NOT DETECTED.  The SARS-CoV-2 RNA is generally detectable in upper respiratory specimens during the acute phase of infection. The lowest concentration of SARS-CoV-2 viral copies this assay can detect is 138 copies/mL. A negative result does not preclude SARS-Cov-2 infection and should not be used as the sole basis for treatment or other patient management decisions. A negative result may occur with  improper specimen collection/handling, submission of specimen other than nasopharyngeal swab, presence of viral mutation(s) within the areas targeted by this assay, and inadequate number of viral copies(<138 copies/mL). A negative result must be combined with clinical observations, patient history, and epidemiological information. The expected result is Negative.  Fact Sheet for Patients:  EntrepreneurPulse.com.au  Fact  Sheet for Healthcare Providers:  IncredibleEmployment.be  This test is no t yet approved or cleared by the [place] FDA and  has been authorized for detection and/or diagnosis of SARS-CoV-2 by FDA under an Emergency Use Authorization (EUA). This EUA will remain  in effect (meaning this test can be used) for the duration of the COVID-19 declaration under Section 564(b)(1) of the Act, 21 U.S.C.section 360bbb-3(b)(1), unless the authorization is terminated  or revoked sooner.       Influenza A by PCR NEGATIVE NEGATIVE Final   Influenza B by PCR NEGATIVE NEGATIVE Final    Comment: (NOTE) The Xpert Xpress SARS-CoV-2/FLU/RSV plus assay is intended as an aid in the diagnosis of influenza from Nasopharyngeal swab specimens and should not be used as a sole basis for treatment. Nasal washings and aspirates are unacceptable for Xpert Xpress SARS-CoV-2/FLU/RSV testing.  Fact Sheet for Patients: EntrepreneurPulse.com.au  Fact Sheet for Healthcare Providers: IncredibleEmployment.be  This test is not yet approved or cleared by the [place] FDA and has been authorized for detection and/or diagnosis of SARS-CoV-2 by FDA under an Emergency Use Authorization (EUA). This EUA will remain in effect (meaning this test can be used) for the duration of the COVID-19 declaration under Section 564(b)(1) of the Act, 21 U.S.C. section 360bbb-3(b)(1), unless the authorization is terminated or revoked.  Performed at [facility], [address] [place], [place] [address]   Resp Panel by RT-PCR (Flu A&B, Covid) Nasopharyngeal Swab     Status: None   Collection Time: 02/19/21  4:40 PM   Specimen: Nasopharyngeal Swab; Nasopharyngeal(NP) swabs in vial transport medium  Result Value Ref Range Status   SARS Coronavirus 2 by RT PCR NEGATIVE NEGATIVE Final    Comment: (NOTE) SARS-CoV-2 target nucleic acids are NOT DETECTED.  The SARS-CoV-2 RNA is  generally detectable in upper respiratory specimens during the acute phase of infection. The lowest concentration of SARS-CoV-2 viral copies this assay can detect is 138 copies/mL. A negative result does not preclude SARS-Cov-2 infection and should not be used as the sole basis for treatment or other patient management decisions. A negative result may occur with  improper specimen  collection/handling, submission of specimen other than nasopharyngeal swab, presence of viral mutation(s) within the areas targeted by this assay, and inadequate number of viral copies(<138 copies/mL). A negative result must be combined with clinical observations, patient history, and epidemiological information. The expected result is Negative.  Fact Sheet for Patients:  EntrepreneurPulse.com.au  Fact Sheet for Healthcare Providers:  IncredibleEmployment.be  This test is no t yet approved or cleared by the [place] FDA and  has been authorized for detection and/or diagnosis of SARS-CoV-2 by FDA under an Emergency Use Authorization (EUA). This EUA will remain  in effect (meaning this test can be used) for the duration of the COVID-19 declaration under Section 564(b)(1) of the Act, 21 U.S.C.section 360bbb-3(b)(1), unless the authorization is terminated  or revoked sooner.       Influenza A by PCR NEGATIVE NEGATIVE Final   Influenza B by PCR NEGATIVE NEGATIVE Final    Comment: (NOTE) The Xpert Xpress SARS-CoV-2/FLU/RSV plus assay is intended as an aid in the diagnosis of influenza from Nasopharyngeal swab specimens and should not be used as a sole basis for treatment. Nasal washings and aspirates are unacceptable for Xpert Xpress SARS-CoV-2/FLU/RSV testing.  Fact Sheet for Patients: EntrepreneurPulse.com.au  Fact Sheet for Healthcare Providers: IncredibleEmployment.be  This test is not yet approved or cleared by the [place] FDA and has been authorized for detection and/or diagnosis of SARS-CoV-2 by FDA under an Emergency Use Authorization (EUA). This EUA will remain in effect (meaning this test can be used) for the duration of the COVID-19 declaration under Section 564(b)(1) of the Act, 21 U.S.C. section 360bbb-3(b)(1), unless the authorization is terminated or revoked.  Performed at [facility], [address] [place], [place] [address]      Radiological Exams on Admission: No results found.  EKG: Independently reviewed. A fib. No acute ST abnormality  Assessment/Plan: Principal Problem:   Acute hepatic encephalopathy Active Problems:   HTN (hypertension)   DM (diabetes mellitus) (HCC)   Hepatic cirrhosis (HCC)   COPD (chronic obstructive pulmonary disease) (HCC)   UGI bleed   Portal vein thrombosis    This patient was discussed with the ED physician, including pertinent vitals, physical exam findings, labs, and imaging.  We also discussed care given by the ED provider.  Acute hepatic encephalopathy Admit Lactulose Repeat metabolic panel in the morning Repeat ammonia level Portal vein thrombosis Not a candidate for anticoagulation Being evaluated for TIPS procedure Atrial fibrillation, rate controlled not on anticoagulation due to GI bleed Currently rate controlled History of upper GI bleed, recent Not on anticoagulation at this point Diabetes Sliding scale insulin Home regimen Hypertension Home regimen  DVT prophylaxis: SCDs Consultants: GI Code Status: Full code confirmed by wife Family Communication: Wife present during interview and exam Disposition Plan: Should go home following improvement of mental status   Truett Mainland, DO
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
## 2021-02-19 NOTE — ED Provider Notes (Signed)
[facility] Provider Note   CSN: [id number] Arrival date & time: 02/19/21  1437     History Chief Complaint  Patient presents with   Fatigue    Tyler Farmer is a 80 y.o. male. Patient's wife is the historian. HPI He presents for evaluation of confusion after seeing his PCP today.  His PCP felt like he was at risk for progression of his chronic illnesses so sent him here for evaluation.  He is here with his wife.  She reports that he "fell yesterday," and injured his face.  Patient is unable to give any history.  He was recently hospitalized and discharged after an evaluation for incidental finding of venous thrombus of the abdomen.  It was felt that he did not need intervention or to be put on anticoagulants because of his prior history of GI bleeding.  He is being monitored closely by his GI service.  He saw Dr. Jenetta Downer, via video visit, yesterday.  Following are his recommendations after that encounter: Continue carvedilol 12.5 mg twice a day for now Start lactulose, titrate to achieve 2-4 bowel movements per day Continue furosemide 20 mg qday Continue spironolactone 50 mg qday Will reach interventional radiology to schedule TIPS placement a possible partial thrombectomy Please notify us if presenting worsening mental status Cancel EGD        Past Medical History:  Diagnosis Date   Arthritis    Hands/Fingers   Back pain    Cirrhosis of liver (HCC)    Diabetes mellitus ([place])    Enlarged prostate    Gastric ulcer    GERD (gastroesophageal reflux disease)    H/O bladder problems    Hypercholesteremia    Hypertension    Kidney stone    Prostate cancer ([place])    Thyroid disease    UGI bleed 10/26/2016   Vitamin B12 deficiency 10/26/2016    Patient Active Problem List   Diagnosis Date Noted   Encephalopathy, hepatic 02/19/2021   Altered mental status 02/19/2021   Portal vein thrombosis 02/12/2021   Abnormal CT scan, gallbladder 01/28/2021    RLQ abdominal pain 05/05/2020   UGI bleed 10/26/2016   Vitamin B12 deficiency 10/26/2016   AVM (arteriovenous malformation) 10/25/2016   Gastric ulcer    Symptomatic anemia    IDA (iron deficiency anemia) 10/23/2015   Heme positive stool 10/23/2015   Hyperlipidemia 05/27/2014   HTN (hypertension) 05/27/2014   DM (diabetes mellitus) ([place]) 05/27/2014   Hepatic cirrhosis ([place]) 05/27/2014   COPD (chronic obstructive pulmonary disease) ([place]) 05/27/2014    Past Surgical History:  Procedure Laterality Date   CIRCUMCISION     at age 53   COLONOSCOPY N/A 11/13/2015   Procedure: COLONOSCOPY;  Surgeon: Rogene Houston, MD;  [facility];  Service: Endoscopy;  Laterality: N/A;  2:10   COLONOSCOPY WITH PROPOFOL N/A 06/17/2020   Procedure: COLONOSCOPY WITH PROPOFOL;  Surgeon: Rogene Houston, MD;  [facility];  Service: Endoscopy;  Laterality: N/A;  AM / patient had positive covid 05/25/20   ESOPHAGEAL BANDING N/A 02/13/2021   Procedure: ESOPHAGEAL BANDING;  Surgeon: Harvel Quale, MD;  [facility];  Service: Gastroenterology;  Laterality: N/A;   ESOPHAGOGASTRODUODENOSCOPY N/A 06/03/2014   Procedure: ESOPHAGOGASTRODUODENOSCOPY (EGD);  Surgeon: Rogene Houston, MD;  [facility];  Service: Endoscopy;  Laterality: N/A;  730   ESOPHAGOGASTRODUODENOSCOPY N/A 10/26/2016   Procedure: ESOPHAGOGASTRODUODENOSCOPY (EGD);  Surgeon: Rogene Houston, MD;  [facility];  Service: Endoscopy;  Laterality: N/A;   ESOPHAGOGASTRODUODENOSCOPY (EGD) WITH PROPOFOL N/A 06/17/2020   Procedure: ESOPHAGOGASTRODUODENOSCOPY (EGD) WITH PROPOFOL;  Surgeon: Rogene Houston, MD;  [facility];  Service: Endoscopy;  Laterality: N/A;   ESOPHAGOGASTRODUODENOSCOPY (EGD) WITH PROPOFOL N/A 02/13/2021   Procedure: ESOPHAGOGASTRODUODENOSCOPY (EGD) WITH PROPOFOL;  Surgeon: Harvel Quale, MD;  [facility];  Service: Gastroenterology;   Laterality: N/A;   Gastric Ulcer  1993   GIVENS CAPSULE STUDY N/A 10/28/2016   Procedure: GIVENS CAPSULE STUDY;  Surgeon: Rogene Houston, MD;  [facility];  Service: Endoscopy;  Laterality: N/A;   HERNIA REPAIR     POLYPECTOMY  11/13/2015   Procedure: POLYPECTOMY;  Surgeon: Rogene Houston, MD;  [facility];  Service: Endoscopy;;  colon   POLYPECTOMY  06/17/2020   Procedure: POLYPECTOMY;  Surgeon: Rogene Houston, MD;  [facility];  Service: Endoscopy;;  sigmoid   Right Elbow Right    A pin was put in       Family History  Problem Relation Age of Onset   Emphysema Mother    Cerebrovascular Accident Father    Heart disease Father    Diabetes Sister    Heart disease Brother    Heart disease Sister    Heart disease Brother    Diabetes Brother     Social History   Tobacco Use   Smoking status: Former    Types: Cigarettes    Quit date: 05/25/1989    Years since quitting: 31.7   Smokeless tobacco: Former    Quit date: 01/16/1990  Vaping Use   Vaping Use: Never used  Substance Use Topics   Alcohol use: No    Alcohol/week: 0.0 standard drinks    Comment: Drank many years - 30 years ago   Drug use: No    Home Medications Prior to Admission medications   Medication Sig Start Date End Date Taking? Authorizing Provider  acetaminophen (TYLENOL) 500 MG tablet Take 500 mg by mouth every 6 (six) hours as needed.    [provider]  aspirin EC 81 MG tablet Take 81 mg by mouth daily. Swallow whole.    [provider]  carvedilol (COREG) 12.5 MG tablet Take 1 tablet (12.5 mg total) by mouth 2 (two) times daily. 02/14/21 02/14/22  Manuella Ghazi, Pratik D, DO  Cranberry 500 MG TABS Take 500 mg by mouth daily.    [provider]  dicyclomine (BENTYL) 10 MG capsule Take 10 mg by mouth 2 (two) times daily as needed for spasms. 07/14/20   Rogene Houston, MD  ferrous sulfate 325 (65 FE) MG tablet Take 325 mg by mouth daily with  breakfast.    [provider]  furosemide (LASIX) 20 MG tablet Take 1 tablet by mouth once daily Patient taking differently: Take 40 mg by mouth daily. 10/19/20   Arnoldo Lenis, MD  glipiZIDE (GLUCOTROL) 10 MG tablet Take 10 mg by mouth 2 (two) times daily before a meal.    [provider]  lactulose (CEPHULAC) 10 g packet Take 1 packet (10 g total) by mouth 2 (two) times daily. Titrate to achieve 2-4 bowel movements per day 02/18/21   Montez Morita, Quillian Quince, MD  levothyroxine (SYNTHROID) 150 MCG tablet Take 137 mcg by mouth daily before breakfast.    [provider]  magnesium oxide (MAG-OX) 400 MG tablet Take 400 mg by mouth daily.    [provider]  pantoprazole (PROTONIX) 40 MG tablet Take 1 tablet (40 mg total) by mouth 2 (two) times daily. 02/14/21 03/16/21  Manuella Ghazi, Pratik D, DO  simvastatin (ZOCOR) 40 MG tablet Take 40 mg by mouth at bedtime.     [provider]  spironolactone (ALDACTONE) 50 MG tablet Take 1 tablet (50 mg total) by mouth daily. 05/10/20   Rogene Houston, MD    Allergies    Penicillins  Review of Systems   Review of Systems  All other systems reviewed and are negative.  Physical Exam Updated Vital Signs BP 123/65   Pulse 95   Temp (!) 97.5 F (36.4 C)   Resp (!) 25   SpO2 98%   Physical Exam Vitals and nursing note reviewed.  Constitutional:      General: He is not in acute distress.    Appearance: He is well-developed. He is not ill-appearing, toxic-appearing or diaphoretic.  HENT:     Head: Normocephalic and atraumatic.     Right Ear: External ear normal.     Left Ear: External ear normal.     Nose: No congestion.     Mouth/Throat:     Pharynx: No oropharyngeal exudate or posterior oropharyngeal erythema.  Eyes:     Conjunctiva/sclera: Conjunctivae normal.     Pupils: Pupils are equal, round, and reactive to light.  Neck:     Trachea: Phonation normal.  Cardiovascular:     Rate and Rhythm: Normal  rate and regular rhythm.     Heart sounds: Normal heart sounds.  Pulmonary:     Effort: Pulmonary effort is normal.     Breath sounds: Normal breath sounds.  Abdominal:     General: There is no distension.     Palpations: Abdomen is soft.     Tenderness: There is no abdominal tenderness.  Musculoskeletal:        General: Normal range of motion.     Cervical back: Normal range of motion and neck supple.  Skin:    General: Skin is warm and dry.  Neurological:     Mental Status: He is alert.     Cranial Nerves: No cranial nerve deficit.     Sensory: No sensory deficit.     Motor: No abnormal muscle tone.     Coordination: Coordination normal.  Psychiatric:        Mood and Affect: Mood normal.        Behavior: Behavior normal.    ED Results / Procedures / Treatments   Labs (all labs ordered are listed, but only abnormal results are displayed) Labs Reviewed  COMPREHENSIVE METABOLIC PANEL - Abnormal; Notable for the following components:      Result Value   Glucose, Bld 129 (*)    BUN 33 (*)    Creatinine, Ser 2.26 (*)    Calcium 8.6 (*)    Albumin 3.2 (*)    GFR, Estimated 29 (*)    All other components within normal limits  CBC WITH DIFFERENTIAL/PLATELET - Abnormal; Notable for the following components:   WBC 2.7 (*)    RBC 3.15 (*)    Hemoglobin 9.6 (*)    HCT 30.7 (*)    RDW 16.4 (*)    Platelets 73 (*)    Lymphs Abs 0.6 (*)    All other components within normal limits  PROTIME-INR - Abnormal; Notable for the following components:   Prothrombin Time 17.4 (*)    INR 1.4 (*)    All other components within  normal limits  AMMONIA - Abnormal; Notable for the following components:   Ammonia 120 (*)    All other components within normal limits  RESP PANEL BY RT-PCR (FLU A&B, COVID) ARPGX2  ETHANOL  URINALYSIS, ROUTINE W REFLEX MICROSCOPIC    EKG EKG Interpretation  Date/Time:  Friday February 19 2021 14:53:01 EST Ventricular Rate:  86 PR Interval:    QRS  Duration: 89 QT Interval:  356 QTC Calculation: 426 R Axis:   70 Text Interpretation: Atrial fibrillation Borderline T abnormalities, inferior leads since last tracing no significant change Confirmed by Daleen Bo [id number]) on 02/19/2021 4:10:03 PM  Radiology No results found.  Procedures .Critical Care Performed by: Daleen Bo, MD Authorized by: Daleen Bo, MD   Critical care provider statement:    Critical care time (minutes):  35   Critical care start time:  02/19/2021 2:58 PM   Critical care end time:  02/19/2021 5:50 PM   Critical care time was exclusive of:  Separately billable procedures and treating other patients   Critical care was necessary to treat or prevent imminent or life-threatening deterioration of the following conditions:  Metabolic crisis and dehydration   Critical care was time spent personally by me on the following activities:  Blood draw for specimens, development of treatment plan with patient or surrogate, discussions with consultants, evaluation of patient's response to treatment, examination of patient, ordering and performing treatments and interventions, ordering and review of laboratory studies, ordering and review of radiographic studies, pulse oximetry, re-evaluation of patient's condition and review of old charts   Medications Ordered in ED Medications  0.9 %  sodium chloride infusion (has no administration in time range)  lactulose (CHRONULAC) 10 GM/15ML solution 30 g (30 g Oral Given 02/19/21 1645)  sodium chloride 0.9 % bolus 500 mL (500 mLs Intravenous New Bag/Given 02/19/21 1644)    ED Course  I have reviewed the triage vital signs and the nursing notes.  Pertinent labs & imaging results that were available during my care of the patient were reviewed by me and considered in my medical decision making (see chart for details).  Clinical Course as of 02/19/21 1750  Fri Feb 19, 2021  1626 I have communicated with his GI physician Dr.  Jenetta Downer.  He request that he be put on the team as a Optometrist.  He does not anticipate any acute GI needs in the next 48 hours so plans on seeing the patient on Monday. [EW]    Clinical Course User Index [EW] Daleen Bo, MD   MDM Rules/Calculators/A&P                            Patient Vitals for the past 24 hrs:  BP Temp Pulse Resp SpO2  02/19/21 1700 123/65 -- 95 (!) 25 98 %  02/19/21 1530 (!) 104/56 -- 84 14 98 %  02/19/21 1500 103/65 -- 60 18 100 %  02/19/21 1454 105/72 (!) 97.5 F (36.4 C) 82 20 100 %      Medical Decision Making:  This patient is presenting for evaluation of status, which does require a range of treatment options, and is a complaint that involves a high risk of morbidity and mortality. The differential diagnoses include hepatic encephalopathy, CVA, metabolic disorders, hemodynamic disability. I decided to review old records, and in summary elderly male with a history of GI bleeding, esophageal varices, intra-abdominal venous thrombosis, atrial fibrillation.  I obtained additional historical information from  wife at bedside.  Clinical Laboratory Tests Ordered, included CBC, Metabolic panel, Urinalysis, and viral panel, level, alcohol level. Review indicates normal except ammonia high, white count low, hemoglobin low, glucose high, BUN high, creatinine high, calcium low, albumin low. Radiologic Tests Ordered, included CT head, CT cervical spine.  I independently Visualized: Radiographic images, which show no acute abnormalities  Cardiac Monitor Tracing which shows atrial fibrillation     Critical Interventions-evaluation, laboratory testing, IV fluids, medication treatment, observation and reassessment  After These Interventions, the Patient was reevaluated and was found with new findings for hepatic encephalopathy and AKI.  He will require hospitalization.  He is actively seeing GI, and was evaluated by them yesterday.  He has a known venous  thrombotic condition, which is being managed expectantly.  He is not a candidate for anticoagulation because of prior gastrointestinal bleeding.  GI has been contacted and will see the patient as a Optometrist, in 72 hours.  CRITICAL CARE-yes Performed by: Daleen Bo  Nursing Notes Reviewed/ Care Coordinated Applicable Imaging Reviewed Interpretation of Laboratory Data incorporated into ED treatment  5:50 PM-Consult complete with hospitalist. Patient case explained and discussed.  He agrees to admit patient for further evaluation and treatment. Call ended at 6:25 PM    Final Clinical Impression(s) / ED Diagnoses Final diagnoses:  Hepatic encephalopathy  AKI (acute kidney injury) [facility])    Rx / [facility] Orders ED Discharge Orders     None        Daleen Bo, MD 02/20/21 1457

## 2021-02-20 ENCOUNTER — Inpatient Hospital Stay (HOSPITAL_COMMUNITY): Payer: Medicare Other

## 2021-02-20 ENCOUNTER — Encounter (HOSPITAL_COMMUNITY): Payer: Self-pay | Admitting: Family Medicine

## 2021-02-20 DIAGNOSIS — K7682 Hepatic encephalopathy: Secondary | ICD-10-CM | POA: Diagnosis not present

## 2021-02-20 DIAGNOSIS — I81 Portal vein thrombosis: Secondary | ICD-10-CM

## 2021-02-20 DIAGNOSIS — K7460 Unspecified cirrhosis of liver: Secondary | ICD-10-CM

## 2021-02-20 DIAGNOSIS — E119 Type 2 diabetes mellitus without complications: Secondary | ICD-10-CM

## 2021-02-20 LAB — BASIC METABOLIC PANEL
Anion gap: 8 (ref 5–15)
BUN: 30 mg/dL — ABNORMAL HIGH (ref 8–23)
CO2: 19 mmol/L — ABNORMAL LOW (ref 22–32)
Calcium: 8.5 mg/dL — ABNORMAL LOW (ref 8.9–10.3)
Chloride: 112 mmol/L — ABNORMAL HIGH (ref 98–111)
Creatinine, Ser: 1.97 mg/dL — ABNORMAL HIGH (ref 0.61–1.24)
GFR, Estimated: 34 mL/min — ABNORMAL LOW (ref >60)
Glucose, Bld: 124 mg/dL — ABNORMAL HIGH (ref 70–99)
Potassium: 4.2 mmol/L (ref 3.5–5.1)
Sodium: 139 mmol/L (ref 135–145)

## 2021-02-20 LAB — CBC
HCT: 28.6 % — ABNORMAL LOW (ref 39.0–52.0)
Hemoglobin: 9 g/dL — ABNORMAL LOW (ref 13.0–17.0)
MCH: 30.7 pg (ref 26.0–34.0)
MCHC: 31.5 g/dL (ref 30.0–36.0)
MCV: 97.6 fL (ref 80.0–100.0)
Platelets: 77 10*3/uL — ABNORMAL LOW (ref 150–400)
RBC: 2.93 MIL/uL — ABNORMAL LOW (ref 4.22–5.81)
RDW: 16.4 % — ABNORMAL HIGH (ref 11.5–15.5)
WBC: 3.9 10*3/uL — ABNORMAL LOW (ref 4.0–10.5)
nRBC: 0 % (ref 0.0–0.2)

## 2021-02-20 LAB — GLUCOSE, CAPILLARY
Glucose-Capillary: 122 mg/dL — ABNORMAL HIGH (ref 70–99)
Glucose-Capillary: 150 mg/dL — ABNORMAL HIGH (ref 70–99)
Glucose-Capillary: 160 mg/dL — ABNORMAL HIGH (ref 70–99)
Glucose-Capillary: 73 mg/dL (ref 70–99)

## 2021-02-20 LAB — AMMONIA: Ammonia: 66 umol/L — ABNORMAL HIGH (ref 9–35)

## 2021-02-20 MED ORDER — CARVEDILOL 12.5 MG PO TABS
12.5000 mg | ORAL_TABLET | Freq: Two times a day (BID) | ORAL | Status: DC
  Administered 2021-02-20 – 2021-02-21 (×3): 12.5 mg via ORAL
  Filled 2021-02-20 (×3): qty 1

## 2021-02-20 MED ORDER — FUROSEMIDE 40 MG PO TABS
40.0000 mg | ORAL_TABLET | Freq: Every day | ORAL | Status: DC
  Administered 2021-02-20: 40 mg via ORAL
  Filled 2021-02-20: qty 1

## 2021-02-20 MED ORDER — DICYCLOMINE HCL 10 MG PO CAPS: 10.0000 mg | ORAL_CAPSULE | Freq: Two times a day (BID) | ORAL | Status: DC | PRN

## 2021-02-20 MED ORDER — GLIPIZIDE 5 MG PO TABS
10.0000 mg | ORAL_TABLET | Freq: Two times a day (BID) | ORAL | Status: DC
  Administered 2021-02-20 – 2021-02-21 (×3): 10 mg via ORAL
  Filled 2021-02-20 (×3): qty 2

## 2021-02-20 MED ORDER — ONDANSETRON HCL 4 MG/2ML IJ SOLN: 4.0000 mg | Freq: Four times a day (QID) | INTRAMUSCULAR | Status: DC | PRN

## 2021-02-20 MED ORDER — PANTOPRAZOLE SODIUM 40 MG PO TBEC
40.0000 mg | DELAYED_RELEASE_TABLET | Freq: Two times a day (BID) | ORAL | Status: DC
  Administered 2021-02-20 – 2021-02-21 (×3): 40 mg via ORAL
  Filled 2021-02-20 (×3): qty 1

## 2021-02-20 MED ORDER — INSULIN ASPART 100 UNIT/ML IJ SOLN: 0.0000 [IU] | Freq: Every day | INTRAMUSCULAR | Status: DC

## 2021-02-20 MED ORDER — ONDANSETRON HCL 4 MG PO TABS: 4.0000 mg | ORAL_TABLET | Freq: Four times a day (QID) | ORAL | Status: DC | PRN

## 2021-02-20 MED ORDER — FERROUS SULFATE 325 (65 FE) MG PO TABS
325.0000 mg | ORAL_TABLET | Freq: Every day | ORAL | Status: DC
  Administered 2021-02-20 – 2021-02-21 (×2): 325 mg via ORAL
  Filled 2021-02-20 (×2): qty 1

## 2021-02-20 MED ORDER — SIMVASTATIN 20 MG PO TABS
40.0000 mg | ORAL_TABLET | Freq: Every day | ORAL | Status: DC
  Administered 2021-02-20 (×2): 40 mg via ORAL
  Filled 2021-02-20 (×2): qty 2

## 2021-02-20 MED ORDER — MAGNESIUM OXIDE -MG SUPPLEMENT 400 (240 MG) MG PO TABS
400.0000 mg | ORAL_TABLET | Freq: Every day | ORAL | Status: DC
  Administered 2021-02-20 – 2021-02-21 (×2): 400 mg via ORAL
  Filled 2021-02-20 (×2): qty 1

## 2021-02-20 MED ORDER — INSULIN ASPART 100 UNIT/ML IJ SOLN
0.0000 [IU] | Freq: Three times a day (TID) | INTRAMUSCULAR | Status: DC
  Administered 2021-02-20: 2 [IU] via SUBCUTANEOUS
  Administered 2021-02-20 (×2): 1 [IU] via SUBCUTANEOUS

## 2021-02-20 MED ORDER — SPIRONOLACTONE 25 MG PO TABS
50.0000 mg | ORAL_TABLET | Freq: Every day | ORAL | Status: DC
  Administered 2021-02-20: 50 mg via ORAL
  Filled 2021-02-20: qty 2

## 2021-02-20 MED ORDER — LEVOTHYROXINE SODIUM 137 MCG PO TABS
137.0000 ug | ORAL_TABLET | Freq: Every day | ORAL | Status: DC
  Administered 2021-02-20 – 2021-02-21 (×2): 137 ug via ORAL
  Filled 2021-02-20 (×2): qty 1

## 2021-02-20 NOTE — Progress Notes (Signed)
PROGRESS NOTE     Tyler Farmer, is a 80 y.o. male, DOB - 1941/01/17, QMV:[id number]  Admit date - 02/19/2021   Admitting Physician Tyler Mainland, DO  Outpatient Primary MD for the patient is Sasser, Silvestre Moment, MD  LOS - 1  Chief Complaint  Patient presents with   Fatigue        Brief Narrative:  80 y.o. male with with a complex past medical history of DM2, HTN, GERD, PAFIB-previously on Eliquis, NASH Liver cirrhosis complicated by nonbleeding grade 2 esophageal varices status post banding, ascites, history of bleeding gastric AVM, peptic ulcer disease status post Billroth II gastrectomy, atrial fibrillation, diabetes, GERD, hyperlipidemia, hypertension, prostate cancer, acute portal vein thrombosis recent evidence of bleeding from gastric ulcer clipped x2, admitted on 02/19/2021 with worsening hepatic encephalopathy.  Assessment & Plan:   Principal Problem:   Acute hepatic encephalopathy Active Problems:   HTN (hypertension)   DM (diabetes mellitus) (HCC)   Hepatic cirrhosis (HCC)   COPD (chronic obstructive pulmonary disease) (HCC)   UGI bleed   Portal vein thrombosis   1)NASH liver cirrhosis with hepatic encephalopathy--ammonia 120 on admission, down to 66 with lactulose -Confusion is slightly better according to patient's wife who is at bedside -GI consult appreciated -Continue lactulose -May need rifaximin down the road -History of recurrent ascites, diuretics currently on hold  2)Portal vein thrombosis----Dr. Jenetta Farmer  intends to talk to IR about possible  TIPS and partial thrombectomy which unfortunately can lead to worsening encephalopathy and also there is concern for significant liver injury with this procedure MELD score will have to be < 15 to have this procedure  3) history of esophageal varices--EGD with banding for grade 3 esophageal varices on 02/13/2021 -GI plans to repeat EGD in about 3 months  4)Paroxysmal atrial fibrillation--- not on  anticoagulation due to recurrent GI bleed -Consider switching Coreg to nadolol or propranolol for rate control given portal hypertension  5)DM2-continue glipizide Use Novolog/Humalog Sliding scale insulin with Accu-Cheks/Fingersticks as ordered   6)Hypothyroidism--- continue levothyroxine  7)GERD-Protonix as ordered  8) pancytopenia--due to underlying liver cirrhosis, Hgb and platelets appear to be intact or close to recent baseline - no obvious bleeding at this time  9)AKI----acute kidney injury -suspect due to dehydration, continue IV fluids -Admission creatinine was 2.26 currently down to 1.97 - baseline is usually around 1.2-1.3 renally adjust medications, avoid nephrotoxic agents / dehydration  / hypotension   Disposition/Need for in-Hospital Stay- patient unable to be discharged at this time due to --acute hepatic encephalopathy requiring lactulose and further GI intervention and AKI requiring IV fluids  Status is: Inpatient  Remains inpatient appropriate because: See disposition above  Disposition: The patient is from: Home              Anticipated d/c is to: Home              Anticipated d/c date is: 2 days              Patient currently is not medically stable to d/c. Barriers: Not Clinically Stable-   Code Status :  -  Code Status: Full Code   Family Communication:  Discussed with wife at bedside  Consults  :  Gi  DVT Prophylaxis  :   - SCDs  SCDs Start: 02/20/21 0104    Lab Results  Component Value Date   PLT 77 (L) 02/20/2021    Inpatient Medications  Scheduled Meds:  carvedilol  12.5 mg Oral BID   ferrous  sulfate  325 mg Oral Q breakfast   glipiZIDE  10 mg Oral BID AC   insulin aspart  0-5 Units Subcutaneous QHS   insulin aspart  0-9 Units Subcutaneous TID WC   lactulose  30 g Oral TID   levothyroxine  137 mcg Oral Q0600   magnesium oxide  400 mg Oral Daily   pantoprazole  40 mg Oral BID   simvastatin  40 mg Oral QHS   Continuous Infusions:   sodium chloride 75 mL/hr at 02/20/21 1739   PRN Meds:.dicyclomine, ondansetron **OR** ondansetron (ZOFRAN) IV   Anti-infectives (From admission, onward)    None         Subjective: Tyler Farmer today has no fevers, no emesis,  No chest pain,   - -Confusion is slightly better according to patient's wife who is at bedside =-Having BMs with lactulose  Objective: Vitals:   02/19/21 2142 02/20/21 0013 02/20/21 0104 02/20/21 0413  BP: (!) 103/57 101/67  (!) 104/58  Pulse: (!) 101 99  73  Resp: 18 17  18   Temp: (!) 97.5 F (36.4 C) 97.6 F (36.4 C)  97.8 F (36.6 C)  TempSrc: Oral Oral    SpO2: 98% 99% 99% 99%  Weight: 84.2 kg     Height: 5' 9"  (1.753 m)       Intake/Output Summary (Last 24 hours) at 02/20/2021 1900 Last data filed at 02/20/2021 1300 Gross per 24 hour  Intake 3132.2 ml  Output 400 ml  Net 2732.2 ml   Filed Weights   02/19/21 2142  Weight: 84.2 kg     Physical Exam  Gen:- --Confusion is slightly better according to patient's wife who is at bedside HEENT:- Tyler Farmer.AT,   Neck-Supple Neck,No JVD,.  Lungs-  CTAB , fair symmetrical air movement CV- S1, S2 normal, regular  Abd-  +ve B.Sounds, Abd Soft, No tenderness,    Extremity/Skin:- No  edema, pedal pulses present  Psych--Confusion is slightly better according to patient's wife who is at bedside Neuro-generalized weakness, no new focal deficits, no tremors  Data Reviewed: I have personally reviewed following labs and imaging studies  CBC: Recent Labs  Lab 02/14/21 0305 02/19/21 1508 02/20/21 0348  WBC 3.5* 2.7* 3.9*  NEUTROABS  --  1.7  --   HGB 9.3* 9.6* 9.0*  HCT 30.6* 30.7* 28.6*  MCV 98.7 97.5 97.6  PLT 107* 73* 77*   Basic Metabolic Panel: Recent Labs  Lab 02/14/21 0305 02/19/21 1508 02/20/21 0348  NA 137 137 139  K 5.1 4.7 4.2  CL 106 106 112*  CO2 24 22 19*  GLUCOSE 139* 129* 124*  BUN 20 33* 30*  CREATININE 1.25* 2.26* 1.97*  CALCIUM 8.8* 8.6* 8.5*  MG 1.6*  --   --     GFR: Estimated Creatinine Clearance: 29.9 mL/min (A) (by C-G formula based on SCr of 1.97 mg/dL (H)). Liver Function Tests: Recent Labs  Lab 02/19/21 1508  AST 26  ALT 19  ALKPHOS 94  BILITOT 1.2  PROT 7.8  ALBUMIN 3.2*   No results for input(s): LIPASE, AMYLASE in the last 168 hours. Recent Labs  Lab 02/19/21 1509 02/20/21 0348  AMMONIA 120* 66*   Coagulation Profile: Recent Labs  Lab 02/19/21 1508  INR 1.4*   Cardiac Enzymes: No results for input(s): CKTOTAL, CKMB, CKMBINDEX, TROPONINI in the last 168 hours. BNP (last 3 results) No results for input(s): PROBNP in the last 8760 hours. HbA1C: No results for input(s): HGBA1C in the last 72  hours. CBG: Recent Labs  Lab 02/14/21 0726 02/14/21 1126 02/20/21 0220 02/20/21 1213 02/20/21 1656  GLUCAP 147* 192* 122* 150* 160*   Lipid Profile: No results for input(s): CHOL, HDL, LDLCALC, TRIG, CHOLHDL, LDLDIRECT in the last 72 hours. Thyroid Function Tests: No results for input(s): TSH, T4TOTAL, FREET4, T3FREE, THYROIDAB in the last 72 hours. Anemia Panel: No results for input(s): VITAMINB12, FOLATE, FERRITIN, TIBC, IRON, RETICCTPCT in the last 72 hours. Urine analysis:    Component Value Date/Time   COLORURINE YELLOW 02/19/2021 1945   APPEARANCEUR CLEAR 02/19/2021 1945   APPEARANCEUR Hazy (A) 11/19/2020 1342   LABSPEC 1.020 02/19/2021 1945   PHURINE 6.0 02/19/2021 1945   GLUCOSEU NEGATIVE 02/19/2021 1945   HGBUR NEGATIVE 02/19/2021 1945   BILIRUBINUR NEGATIVE 02/19/2021 1945   BILIRUBINUR Negative 11/19/2020 1342   [place] 02/19/2021 1945   PROTEINUR NEGATIVE 02/19/2021 1945   NITRITE NEGATIVE 02/19/2021 1945   LEUKOCYTESUR NEGATIVE 02/19/2021 1945   Sepsis Labs: @LABRCNTIP (procalcitonin:4,lacticidven:4)  ) Recent Results (from the past 240 hour(s))  Resp Panel by RT-PCR (Flu A&B, Covid) Nasopharyngeal Swab     Status: None   Collection Time: 02/12/21  3:16 PM   Specimen: Nasopharyngeal  Swab; Nasopharyngeal(NP) swabs in vial transport medium  Result Value Ref Range Status   SARS Coronavirus 2 by RT PCR NEGATIVE NEGATIVE Final    Comment: (NOTE) SARS-CoV-2 target nucleic acids are NOT DETECTED.  The SARS-CoV-2 RNA is generally detectable in upper respiratory specimens during the acute phase of infection. The lowest concentration of SARS-CoV-2 viral copies this assay can detect is 138 copies/mL. A negative result does not preclude SARS-Cov-2 infection and should not be used as the sole basis for treatment or other patient management decisions. A negative result may occur with  improper specimen collection/handling, submission of specimen other than nasopharyngeal swab, presence of viral mutation(s) within the areas targeted by this assay, and inadequate number of viral copies(<138 copies/mL). A negative result must be combined with clinical observations, patient history, and epidemiological information. The expected result is Negative.  Fact Sheet for Patients:  EntrepreneurPulse.com.au  Fact Sheet for Healthcare Providers:  IncredibleEmployment.be  This test is no t yet approved or cleared by the [place] FDA and  has been authorized for detection and/or diagnosis of SARS-CoV-2 by FDA under an Emergency Use Authorization (EUA). This EUA will remain  in effect (meaning this test can be used) for the duration of the COVID-19 declaration under Section 564(b)(1) of the Act, 21 U.S.C.section 360bbb-3(b)(1), unless the authorization is terminated  or revoked sooner.       Influenza A by PCR NEGATIVE NEGATIVE Final   Influenza B by PCR NEGATIVE NEGATIVE Final    Comment: (NOTE) The Xpert Xpress SARS-CoV-2/FLU/RSV plus assay is intended as an aid in the diagnosis of influenza from Nasopharyngeal swab specimens and should not be used as a sole basis for treatment. Nasal washings and aspirates are unacceptable for Xpert Xpress  SARS-CoV-2/FLU/RSV testing.  Fact Sheet for Patients: EntrepreneurPulse.com.au  Fact Sheet for Healthcare Providers: IncredibleEmployment.be  This test is not yet approved or cleared by the [place] FDA and has been authorized for detection and/or diagnosis of SARS-CoV-2 by FDA under an Emergency Use Authorization (EUA). This EUA will remain in effect (meaning this test can be used) for the duration of the COVID-19 declaration under Section 564(b)(1) of the Act, 21 U.S.C. section 360bbb-3(b)(1), unless the authorization is terminated or revoked.  Performed at [facility] [address] [place], [place]  [address]   Resp Panel by RT-PCR (Flu A&B, Covid) Nasopharyngeal Swab     Status: None   Collection Time: 02/19/21  4:40 PM   Specimen: Nasopharyngeal Swab; Nasopharyngeal(NP) swabs in vial transport medium  Result Value Ref Range Status   SARS Coronavirus 2 by RT PCR NEGATIVE NEGATIVE Final    Comment: (NOTE) SARS-CoV-2 target nucleic acids are NOT DETECTED.  The SARS-CoV-2 RNA is generally detectable in upper respiratory specimens during the acute phase of infection. The lowest concentration of SARS-CoV-2 viral copies this assay can detect is 138 copies/mL. A negative result does not preclude SARS-Cov-2 infection and should not be used as the sole basis for treatment or other patient management decisions. A negative result may occur with  improper specimen collection/handling, submission of specimen other than nasopharyngeal swab, presence of viral mutation(s) within the areas targeted by this assay, and inadequate number of viral copies(<138 copies/mL). A negative result must be combined with clinical observations, patient history, and epidemiological information. The expected result is Negative.  Fact Sheet for Patients:  EntrepreneurPulse.com.au  Fact Sheet for Healthcare Providers:   IncredibleEmployment.be  This test is no t yet approved or cleared by the [place] FDA and  has been authorized for detection and/or diagnosis of SARS-CoV-2 by FDA under an Emergency Use Authorization (EUA). This EUA will remain  in effect (meaning this test can be used) for the duration of the COVID-19 declaration under Section 564(b)(1) of the Act, 21 U.S.C.section 360bbb-3(b)(1), unless the authorization is terminated  or revoked sooner.       Influenza A by PCR NEGATIVE NEGATIVE Final   Influenza B by PCR NEGATIVE NEGATIVE Final    Comment: (NOTE) The Xpert Xpress SARS-CoV-2/FLU/RSV plus assay is intended as an aid in the diagnosis of influenza from Nasopharyngeal swab specimens and should not be used as a sole basis for treatment. Nasal washings and aspirates are unacceptable for Xpert Xpress SARS-CoV-2/FLU/RSV testing.  Fact Sheet for Patients: EntrepreneurPulse.com.au  Fact Sheet for Healthcare Providers: IncredibleEmployment.be  This test is not yet approved or cleared by the [place] FDA and has been authorized for detection and/or diagnosis of SARS-CoV-2 by FDA under an Emergency Use Authorization (EUA). This EUA will remain in effect (meaning this test can be used) for the duration of the COVID-19 declaration under Section 564(b)(1) of the Act, 21 U.S.C. section 360bbb-3(b)(1), unless the authorization is terminated or revoked.  Performed at [facility], [address], [place], [place] [address]       Radiology Studies: CT Head Wo Contrast  Result Date: 02/19/2021 CLINICAL DATA:  Poly trauma with head and cervical spine injury suspected. EXAM: CT HEAD WITHOUT CONTRAST CT CERVICAL SPINE WITHOUT CONTRAST TECHNIQUE: Multidetector CT imaging of the head and cervical spine was performed following the standard protocol without intravenous contrast. Multiplanar CT image reconstructions of the cervical  spine were also generated. COMPARISON:  None. FINDINGS: CT HEAD FINDINGS Brain: There is mild-to-moderate atrophy, small vessel disease and atrophic ventriculomegaly, without midline shift. There is no asymmetry concerning for acute infarct, hemorrhage or mass, or old territorial infarct. Vascular: There are calcifications in the siphons and distal left vertebral artery but no hyperdense central vessels. Skull: Normal. Negative for fracture or focal lesion. Sinuses/Orbits: Unremarkable orbital contents. S shaped nasal septum with right-sided spurring. There is mild membrane disease in the ethmoid and right maxillary sinuses without fluid levels or other significant sinus findings. There is mild patchy fluid in the lower right mastoid air cells. Left mastoids  are clear. Other: None. CT CERVICAL SPINE FINDINGS Alignment: There is a minimal grade 1 anterolisthesis at C7-T1 most likely due to facet hypertrophy. No traumatic or further listhesis is seen. Skull base and vertebrae: There is osteopenia without evidence of fractures. There is cervical spondylosis. Marginal osteophytes are most prominent at C5-6 and C6-7, with only minimal marginal osteophytes at the remaining levels. There is narrowing and osteophytosis of the anterior atlantodental joint . Soft tissues and spinal canal: No prevertebral fluid or swelling. No visible canal hematoma.There are patchy calcifications at the carotid bifurcations. Disc levels: Degenerative disc collapse is seen at C5-6 and C6-7, with posterior disc osteophyte complex causing mild ventral thecal sac encroachment at these levels without cord compression. Other levels do not show significant disc space loss or soft tissue or bony encroachment on the thecal sac. Upper chest: There are emphysematous and scarring changes in the lung apices. Atrophic thyroid. Other: None. IMPRESSION: 1. no acute intracranial CT findings or depressed skull fractures. 2. Atrophy and small vessel disease. 3.  Sinus membrane disease and fluid in the lower right mastoid air cells. 4. Cervical osteopenia and degenerative changes without evidence of fractures. Electronically Signed   By: Telford Nab M.D.   On: 02/19/2021 21:36   CT Cervical Spine Wo Contrast  Result Date: 02/19/2021 CLINICAL DATA:  Poly trauma with head and cervical spine injury suspected. EXAM: CT HEAD WITHOUT CONTRAST CT CERVICAL SPINE WITHOUT CONTRAST TECHNIQUE: Multidetector CT imaging of the head and cervical spine was performed following the standard protocol without intravenous contrast. Multiplanar CT image reconstructions of the cervical spine were also generated. COMPARISON:  None. FINDINGS: CT HEAD FINDINGS Brain: There is mild-to-moderate atrophy, small vessel disease and atrophic ventriculomegaly, without midline shift. There is no asymmetry concerning for acute infarct, hemorrhage or mass, or old territorial infarct. Vascular: There are calcifications in the siphons and distal left vertebral artery but no hyperdense central vessels. Skull: Normal. Negative for fracture or focal lesion. Sinuses/Orbits: Unremarkable orbital contents. S shaped nasal septum with right-sided spurring. There is mild membrane disease in the ethmoid and right maxillary sinuses without fluid levels or other significant sinus findings. There is mild patchy fluid in the lower right mastoid air cells. Left mastoids are clear. Other: None. CT CERVICAL SPINE FINDINGS Alignment: There is a minimal grade 1 anterolisthesis at C7-T1 most likely due to facet hypertrophy. No traumatic or further listhesis is seen. Skull base and vertebrae: There is osteopenia without evidence of fractures. There is cervical spondylosis. Marginal osteophytes are most prominent at C5-6 and C6-7, with only minimal marginal osteophytes at the remaining levels. There is narrowing and osteophytosis of the anterior atlantodental joint . Soft tissues and spinal canal: No prevertebral fluid or  swelling. No visible canal hematoma.There are patchy calcifications at the carotid bifurcations. Disc levels: Degenerative disc collapse is seen at C5-6 and C6-7, with posterior disc osteophyte complex causing mild ventral thecal sac encroachment at these levels without cord compression. Other levels do not show significant disc space loss or soft tissue or bony encroachment on the thecal sac. Upper chest: There are emphysematous and scarring changes in the lung apices. Atrophic thyroid. Other: None. IMPRESSION: 1. no acute intracranial CT findings or depressed skull fractures. 2. Atrophy and small vessel disease. 3. Sinus membrane disease and fluid in the lower right mastoid air cells. 4. Cervical osteopenia and degenerative changes without evidence of fractures. Electronically Signed   By: Telford Nab M.D.   On: 02/19/2021 21:36   DG  CHEST PORT 1 VIEW  Result Date: 02/20/2021 CLINICAL DATA:  Altered mental status EXAM: PORTABLE CHEST 1 VIEW COMPARISON:  01/27/2021 FINDINGS: Persistent interstitial prominence similar to the prior study. No new consolidation. No pleural effusion. Stable cardiomediastinal contours. IMPRESSION: Stable nonspecific interstitial changes. Electronically Signed   By: Macy Mis M.D.   On: 02/20/2021 10:18     Scheduled Meds:  carvedilol  12.5 mg Oral BID   ferrous sulfate  325 mg Oral Q breakfast   glipiZIDE  10 mg Oral BID AC   insulin aspart  0-5 Units Subcutaneous QHS   insulin aspart  0-9 Units Subcutaneous TID WC   lactulose  30 g Oral TID   levothyroxine  137 mcg Oral Q0600   magnesium oxide  400 mg Oral Daily   pantoprazole  40 mg Oral BID   simvastatin  40 mg Oral QHS   Continuous Infusions:  sodium chloride 75 mL/hr at 02/20/21 1739     LOS: 1 day    Roxan Hockey M.D on 02/20/2021 at 7:00 PM  Go to [email or web address] - for contact info  [facility] - Office  [phone]  If 7PM-7AM, please contact night-coverage [email or web address] Password  [email or web address] 02/20/2021, 7:00 PM

## 2021-02-20 NOTE — Progress Notes (Signed)
Patient had 16Fr straight cath in from ED for urine retention. Patient has removed. No current order to place another. Provider notified.

## 2021-02-20 NOTE — Consult Note (Signed)
[name], M.D. Gastroenterology & Hepatology                                           Patient Name: Ferguson Gertner Pembroke Account #: @FLAACCTNO @   MRN: [id number] Admission Date: 02/19/2021 Date of Evaluation:  02/20/2021 Time of Evaluation: 9:14 AM   Referring Physician: Daleen Bo, MD  Chief Complaint:  hepatic encephalopathy  HPI:  Raford Brissett Nand is a 80 y.o. male with with a complex past medical history of [name] cirrhosis complicated by nonbleeding grade 2 esophageal varices status post banding, ascites, history of bleeding gastric AVM, peptic ulcer disease status post Billroth II gastrectomy, atrial fibrillation, diabetes, GERD, hyperlipidemia, hypertension, prostate cancer, acute portal vein thrombosis recent evidence of bleeding from gastric ulcer clipped x2, who was admitted to the hospital after presenting worsening hepatic encephalopathy.  Per the wife, the patient had worsening mentation since he was discharged from the hospital after recent hospitalization where he was being evaluated for acute portal vein thrombosis.  I actually had a telephone conversation 2 days ago with the wife and the patient and they endorsed these symptoms.  He denied any other symptoms such as respiratory complaints or urinary complaints.  Due to this I started him on lactulose but they could not pick up the medication from the pharmacy.  He was having an average of 1 bowel movement per day sometimes he was having 2 bowel movements every day.  He was doing well otherwise after being discharged from the hospital as recommended in the previous progress note.  However, he was seen by his PCP yesterday who recommended coming to the hospital for further evaluation of worsening mental status.  The wife reports that he was disoriented yesterday and was not making any sense. The patient denies having any nausea, vomiting, fever, chills, hematochezia, melena, hematemesis, abdominal distention, abdominal pain,  diarrhea, jaundice, pruritus or weight loss.  Recently, I had a discussion with the wife regarding the possible management of his portal vein thrombosis.  His high risk patient for gastrointestinal bleeding given his esophageal varices and gastric ulcers.  I had a discussion with interventional radiology in the past and it was considered that he could benefit potentially from TIPS and partial thrombectomy although he could have worsening encephalopathy and is due to concern for significant liver injury.  The wife expressed that he was interested in pursuing the TIPS placement.  However I have not discussed that yet with interventional radiology given his acute clinical condition.  In the ED, he was HD stable and afebrile. Labs were remarkable for increased creatinine of 2.26, BUN of 33, normal electrolytes, normal liver enzymes with alkaline phosphatase 94, bilirubin 1.2, ALT 19, AST 26, CBC with white blood cell count of 2.7, hemoglobin of 9.6, platelets of 73,000, INR was 1.4. His ammonia was 120.  Urinalysis was within normal limits and alcohol in blood was negative.  Had negative testing for influenza and SARS COVID-19.  The patient was admitted to the hospital for management of hepatic encephalopathy.   Past Medical History: SEE CHRONIC ISSSUES: Past Medical History:  Diagnosis Date   Arthritis    Hands/Fingers   Back pain    Cirrhosis of liver ([place])    Diabetes mellitus ([place])    Enlarged prostate    Gastric ulcer    GERD (gastroesophageal reflux disease)    H/O bladder problems  Hypercholesteremia    Hypertension    Kidney stone    Prostate cancer ([place])    Thyroid disease    UGI bleed 10/26/2016   Vitamin B12 deficiency 10/26/2016   Past Surgical History:  Past Surgical History:  Procedure Laterality Date   CIRCUMCISION     at age 62   COLONOSCOPY N/A 11/13/2015   Procedure: COLONOSCOPY;  Surgeon: Rogene Houston, MD;  [facility];  Service: Endoscopy;  Laterality:  N/A;  2:10   COLONOSCOPY WITH PROPOFOL N/A 06/17/2020   Procedure: COLONOSCOPY WITH PROPOFOL;  Surgeon: Rogene Houston, MD;  [facility];  Service: Endoscopy;  Laterality: N/A;  AM / patient had positive covid 05/25/20   ESOPHAGEAL BANDING N/A 02/13/2021   Procedure: ESOPHAGEAL BANDING;  Surgeon: Harvel Quale, MD;  [facility];  Service: Gastroenterology;  Laterality: N/A;   ESOPHAGOGASTRODUODENOSCOPY N/A 06/03/2014   Procedure: ESOPHAGOGASTRODUODENOSCOPY (EGD);  Surgeon: Rogene Houston, MD;  [facility];  Service: Endoscopy;  Laterality: N/A;  730   ESOPHAGOGASTRODUODENOSCOPY N/A 10/26/2016   Procedure: ESOPHAGOGASTRODUODENOSCOPY (EGD);  Surgeon: Rogene Houston, MD;  [facility];  Service: Endoscopy;  Laterality: N/A;   ESOPHAGOGASTRODUODENOSCOPY (EGD) WITH PROPOFOL N/A 06/17/2020   Procedure: ESOPHAGOGASTRODUODENOSCOPY (EGD) WITH PROPOFOL;  Surgeon: Rogene Houston, MD;  [facility];  Service: Endoscopy;  Laterality: N/A;   ESOPHAGOGASTRODUODENOSCOPY (EGD) WITH PROPOFOL N/A 02/13/2021   Procedure: ESOPHAGOGASTRODUODENOSCOPY (EGD) WITH PROPOFOL;  Surgeon: Harvel Quale, MD;  [facility];  Service: Gastroenterology;  Laterality: N/A;   Gastric Ulcer  1993   GIVENS CAPSULE STUDY N/A 10/28/2016   Procedure: GIVENS CAPSULE STUDY;  Surgeon: Rogene Houston, MD;  [facility];  Service: Endoscopy;  Laterality: N/A;   HERNIA REPAIR     POLYPECTOMY  11/13/2015   Procedure: POLYPECTOMY;  Surgeon: Rogene Houston, MD;  [facility];  Service: Endoscopy;;  colon   POLYPECTOMY  06/17/2020   Procedure: POLYPECTOMY;  Surgeon: Rogene Houston, MD;  [facility];  Service: Endoscopy;;  sigmoid   Right Elbow Right    A pin was put in   Family History:  Family History  Problem Relation Age of Onset   Emphysema Mother    Cerebrovascular Accident Father    Heart disease Father     Diabetes Sister    Heart disease Brother    Heart disease Sister    Heart disease Brother    Diabetes Brother    Social History:  Social History   Tobacco Use   Smoking status: Former    Types: Cigarettes    Quit date: 05/25/1989    Years since quitting: 31.7   Smokeless tobacco: Former    Quit date: 01/16/1990  Vaping Use   Vaping Use: Never used  Substance Use Topics   Alcohol use: No    Alcohol/week: 0.0 standard drinks    Comment: Drank many years - 30 years ago   Drug use: No    Home Medications:  Prior to Admission medications   Medication Sig Start Date End Date Taking? Authorizing Provider  acetaminophen (TYLENOL) 500 MG tablet Take 500 mg by mouth every 6 (six) hours as needed.   Yes [provider]  aspirin EC 81 MG tablet Take 81 mg by mouth daily. Swallow whole.   Yes [provider]  carvedilol (COREG) 12.5 MG tablet Take 1 tablet (12.5 mg total) by mouth 2 (  two) times daily. 02/14/21 02/14/22 Yes Shah, Pratik D, DO  Cranberry 500 MG TABS Take 500 mg by mouth daily.   Yes [provider]  dicyclomine (BENTYL) 10 MG capsule Take 10 mg by mouth 2 (two) times daily as needed for spasms. 07/14/20  Yes Rehman, Mechele Dawley, MD  ferrous sulfate 325 (65 FE) MG tablet Take 325 mg by mouth daily with breakfast.   Yes [provider]  furosemide (LASIX) 20 MG tablet Take 1 tablet by mouth once daily Patient taking differently: Take 40 mg by mouth daily. 10/19/20  Yes Branch, Alphonse Guild, MD  glipiZIDE (GLUCOTROL) 10 MG tablet Take 10 mg by mouth 2 (two) times daily before a meal.   Yes [provider]  lactulose (CEPHULAC) 10 g packet Take 1 packet (10 g total) by mouth 2 (two) times daily. Titrate to achieve 2-4 bowel movements per day 02/18/21  Yes Montez Morita, Quillian Quince, MD  levothyroxine (SYNTHROID) 150 MCG tablet Take 137 mcg by mouth daily before breakfast.   Yes [provider]  magnesium oxide (MAG-OX) 400 MG tablet  Take 400 mg by mouth daily.   Yes [provider]  pantoprazole (PROTONIX) 40 MG tablet Take 1 tablet (40 mg total) by mouth 2 (two) times daily. 02/14/21 03/16/21 Yes Shah, Pratik D, DO  simvastatin (ZOCOR) 40 MG tablet Take 40 mg by mouth at bedtime.    Yes [provider]  spironolactone (ALDACTONE) 50 MG tablet Take 1 tablet (50 mg total) by mouth daily. 05/10/20  Yes Rehman, Mechele Dawley, MD    Inpatient Medications:  Current Facility-Administered Medications:    0.9 %  sodium chloride infusion, , Intravenous, Continuous, Truett Mainland, DO, Last Rate: 75 mL/hr at 02/20/21 0325, New Bag at 02/20/21 0325   carvedilol (COREG) tablet 12.5 mg, 12.5 mg, Oral, BID, Truett Mainland, DO, 12.5 mg at 02/20/21 8937   dicyclomine (BENTYL) capsule 10 mg, 10 mg, Oral, BID PRN, Truett Mainland, DO   ferrous sulfate tablet 325 mg, 325 mg, Oral, Q breakfast, Truett Mainland, DO, 325 mg at 02/20/21 0820   furosemide (LASIX) tablet 40 mg, 40 mg, Oral, Daily, Truett Mainland, DO, 40 mg at 02/20/21 0820   glipiZIDE (GLUCOTROL) tablet 10 mg, 10 mg, Oral, BID AC, Stinson, Jacob J, DO, 10 mg at 02/20/21 0820   insulin aspart (novoLOG) injection 0-5 Units, 0-5 Units, Subcutaneous, QHS, Stinson, Jacob J, DO   insulin aspart (novoLOG) injection 0-9 Units, 0-9 Units, Subcutaneous, TID WC, Stinson, Jacob J, DO, 1 Units at 02/20/21 0820   lactulose (CHRONULAC) 10 GM/15ML solution 30 g, 30 g, Oral, TID, Truett Mainland, DO, 30 g at 02/20/21 3428   levothyroxine (SYNTHROID) tablet 137 mcg, 137 mcg, Oral, Q0600, Truett Mainland, DO, 137 mcg at 02/20/21 7681   magnesium oxide (MAG-OX) tablet 400 mg, 400 mg, Oral, Daily, Truett Mainland, DO, 400 mg at 02/20/21 0820   ondansetron (ZOFRAN) tablet 4 mg, 4 mg, Oral, Q6H PRN **OR** ondansetron (ZOFRAN) injection 4 mg, 4 mg, Intravenous, Q6H PRN, Stinson, Jacob J, DO   pantoprazole (PROTONIX) EC tablet 40 mg, 40 mg, Oral, BID, Stinson, Jacob J, DO, 40 mg at  02/20/21 0820   simvastatin (ZOCOR) tablet 40 mg, 40 mg, Oral, QHS, Stinson, Jacob J, DO, 40 mg at 02/20/21 1572   spironolactone (ALDACTONE) tablet 50 mg, 50 mg, Oral, Daily, Truett Mainland, DO, 50 mg at 02/20/21 6203 Allergies: Penicillins  Complete Review of Systems:  GENERAL: negative for malaise, night sweats HEENT: No changes in hearing or vision, no nose bleeds or other nasal problems. NECK: Negative for lumps, goiter, pain and significant neck swelling RESPIRATORY: Negative for cough, wheezing CARDIOVASCULAR: Negative for chest pain, leg swelling, palpitations, orthopnea GI: SEE HPI MUSCULOSKELETAL: Negative for joint pain or swelling, back pain, and muscle pain. SKIN: Negative for lesions, rash PSYCH: Negative for sleep disturbance, mood disorder and recent psychosocial stressors. HEMATOLOGY Negative for prolonged bleeding, bruising easily, and swollen nodes. ENDOCRINE: Negative for cold or heat intolerance, polyuria, polydipsia and goiter. NEURO: negative for tremor, gait imbalance, syncope and seizures. The remainder of the review of systems is noncontributory.  Physical Exam: BP (!) 104/58 (BP Location: Right Arm)   Pulse 73   Temp 97.8 F (36.6 C)   Resp 18   Ht 5' 9"  (1.753 m)   Wt 84.2 kg   SpO2 99%   BMI 27.41 kg/m  GENERAL: The patient is AO X2, in no acute distress. HEENT: Head is normocephalic and atraumatic. EOMI are intact. Mouth is well hydrated and without lesions. NECK: Supple. No masses LUNGS: Clear to auscultation. No presence of rhonchi/wheezing/rales. Adequate chest expansion HEART: RRR, normal s1 and s2. ABDOMEN: Soft, nontender, no guarding, no peritoneal signs, and nondistended. BS +. No masses. RECTAL EXAM: no external lesions, normal tone, no masses, brown stool without blood. EXTREMITIES: Without any cyanosis, clubbing, rash, lesions or edema. NEUROLOGIC: AOx2, no focal motor deficit. Has mild asterixis. SKIN: no jaundice, no  rashes  Laboratory Data CBC:     Component Value Date/Time   WBC 3.9 (L) 02/20/2021 0348   RBC 2.93 (L) 02/20/2021 0348   HGB 9.0 (L) 02/20/2021 0348   HCT 28.6 (L) 02/20/2021 0348   PLT 77 (L) 02/20/2021 0348   MCV 97.6 02/20/2021 0348   MCH 30.7 02/20/2021 0348   MCHC 31.5 02/20/2021 0348   RDW 16.4 (H) 02/20/2021 0348   LYMPHSABS 0.6 (L) 02/19/2021 1508   MONOABS 0.3 02/19/2021 1508   EOSABS 0.0 02/19/2021 1508   BASOSABS 0.0 02/19/2021 1508   COAG:  Lab Results  Component Value Date   INR 1.4 (H) 02/19/2021   INR 1.4 (H) 02/13/2021   INR 1.3 (H) 02/12/2021    BMP:  BMP Latest Ref Rng & Units 02/20/2021 02/19/2021 02/14/2021  Glucose 70 - 99 mg/dL 124(H) 129(H) 139(H)  BUN 8 - 23 mg/dL 30(H) 33(H) 20  Creatinine 0.61 - 1.24 mg/dL 1.97(H) 2.26(H) 1.25(H)  BUN/Creat Ratio 6 - 22 (calc) - - -  Sodium 135 - 145 mmol/L 139 137 137  Potassium 3.5 - 5.1 mmol/L 4.2 4.7 5.1  Chloride 98 - 111 mmol/L 112(H) 106 106  CO2 22 - 32 mmol/L 19(L) 22 24  Calcium 8.9 - 10.3 mg/dL 8.5(L) 8.6(L) 8.8(L)    HEPATIC:  Hepatic Function Latest Ref Rng & Units 02/19/2021 02/13/2021 02/12/2021  Total Protein 6.5 - 8.1 g/dL 7.8 6.7 7.7  Albumin 3.5 - 5.0 g/dL 3.2(L) 2.6(L) 3.1(L)  AST 15 - 41 U/L 26 25 29   ALT 0 - 44 U/L 19 17 21   Alk Phosphatase 38 - 126 U/L 94 92 113  Total Bilirubin 0.3 - 1.2 mg/dL 1.2 0.7 0.7    CARDIAC:  Lab Results  Component Value Date   TROPONINI <0.03 10/24/2016     Imaging: I personally reviewed and interpreted the available imaging.  Assessment & Plan: Hoy Fallert Walle is a 80 y.o. male with with a complex past medical history of  Nash cirrhosis complicated by nonbleeding grade 2 esophageal varices status post banding, ascites, history of bleeding gastric AVM, peptic ulcer disease status post Billroth II gastrectomy, atrial fibrillation, diabetes, GERD, hyperlipidemia, hypertension, prostate cancer, acute portal vein thrombosis recent evidence of bleeding  from gastric ulcer clipped x2, who was admitted to the hospital after presenting worsening hepatic encephalopathy.  The patient has responded to his initial doses of lactulose and is clinically doing better but is still mildly encephalopathic.  We may need to continue his current dosage of lactulose to achieve a goal of 2-4 bowel movements per day.  I discussed this with the wife who understood.  I do not consider he has a clear acute trigger for his encephalopathy, but the flow changes to the acute portal vein thrombosis.  We will need to rule out respiratory component leading to his encephalopathy with a chest x-ray.  I do not consider this is related to a gastrointestinal bleeding.  Recently, I had a discussion with the wife regarding the possible management of his portal vein thrombosis.  His high risk patient for gastrointestinal bleeding given his esophageal varices and gastric ulcers.  I had a discussion with interventional radiology in the past and it was considered that he could benefit potentially from TIPS and partial thrombectomy although he could have worsening encephalopathy and is due to concern for significant liver injury.  The wife expressed that he was interested in pursuing the TIPS placement.  However I have not discussed that yet with interventional radiology given his acute clinical condition.  This will be further discussed based on how he recovers from his acute event but also if his MELD decreases back to less than 15.  Possibly he will need Xifaxan if he has a TIPS placed given his new episodes of encephalopathy.  Patient has also developed AKI, possibly related to poor oral intake of fluids.  He has responded to IV fluid hydration which he should continue as well as advancing his diet.  We will recommend holding his diuretics for now and trending his MELD labs daily.  #Decompensated NASH cirrhosis - Daily MELD labs  # Hepatic encephalopathy - Titrate lactulose to achieve 2-4  Bms per day  - Avoid opiates or benzodiazepines - Check CXR  # Acute portal vein thrombosis - Will reevaluate if he is candidate for TIPS + partial thrombectomy depending of MELD improvement  # Ascites - Low sodium diet - Diuretics - hold for now  # Esophageal varices - Last EGD 02/13/2021 - grade III esophageal varices s/p banding x3 - Repeat EGD possibly in 3 month to assess ulcer healing vs in 4 weeks if not undergoing TIPS placement  # FEN - Can take Tylenol max of 2 g per day (650 mg q8h) for pain - Avoid NSAIDs for pain - Protein shake (Boost, Ensure or  every night before going to sleep  Harvel Quale, MD Gastroenterology and Hepatology [facility]

## 2021-02-21 ENCOUNTER — Other Ambulatory Visit: Payer: Self-pay

## 2021-02-21 DIAGNOSIS — K7682 Hepatic encephalopathy: Secondary | ICD-10-CM | POA: Diagnosis not present

## 2021-02-21 LAB — COMPREHENSIVE METABOLIC PANEL
ALT: 17 U/L (ref 0–44)
AST: 22 U/L (ref 15–41)
Albumin: 2.9 g/dL — ABNORMAL LOW (ref 3.5–5.0)
Alkaline Phosphatase: 85 U/L (ref 38–126)
Anion gap: 7 (ref 5–15)
BUN: 23 mg/dL (ref 8–23)
CO2: 20 mmol/L — ABNORMAL LOW (ref 22–32)
Calcium: 8.4 mg/dL — ABNORMAL LOW (ref 8.9–10.3)
Chloride: 111 mmol/L (ref 98–111)
Creatinine, Ser: 1.61 mg/dL — ABNORMAL HIGH (ref 0.61–1.24)
GFR, Estimated: 43 mL/min — ABNORMAL LOW (ref >60)
Glucose, Bld: 107 mg/dL — ABNORMAL HIGH (ref 70–99)
Potassium: 3.6 mmol/L (ref 3.5–5.1)
Sodium: 138 mmol/L (ref 135–145)
Total Bilirubin: 1.8 mg/dL — ABNORMAL HIGH (ref 0.3–1.2)
Total Protein: 7 g/dL (ref 6.5–8.1)

## 2021-02-21 LAB — CBC
HCT: 29.2 % — ABNORMAL LOW (ref 39.0–52.0)
Hemoglobin: 9.1 g/dL — ABNORMAL LOW (ref 13.0–17.0)
MCH: 30.1 pg (ref 26.0–34.0)
MCHC: 31.2 g/dL (ref 30.0–36.0)
MCV: 96.7 fL (ref 80.0–100.0)
Platelets: 63 10*3/uL — ABNORMAL LOW (ref 150–400)
RBC: 3.02 MIL/uL — ABNORMAL LOW (ref 4.22–5.81)
RDW: 16.2 % — ABNORMAL HIGH (ref 11.5–15.5)
WBC: 2.6 10*3/uL — ABNORMAL LOW (ref 4.0–10.5)
nRBC: 0 % (ref 0.0–0.2)

## 2021-02-21 LAB — RENAL FUNCTION PANEL
Albumin: 2.9 g/dL — ABNORMAL LOW (ref 3.5–5.0)
Anion gap: 7 (ref 5–15)
BUN: 24 mg/dL — ABNORMAL HIGH (ref 8–23)
CO2: 20 mmol/L — ABNORMAL LOW (ref 22–32)
Calcium: 8.5 mg/dL — ABNORMAL LOW (ref 8.9–10.3)
Chloride: 110 mmol/L (ref 98–111)
Creatinine, Ser: 1.63 mg/dL — ABNORMAL HIGH (ref 0.61–1.24)
GFR, Estimated: 42 mL/min — ABNORMAL LOW (ref >60)
Glucose, Bld: 105 mg/dL — ABNORMAL HIGH (ref 70–99)
Phosphorus: 3 mg/dL (ref 2.5–4.6)
Potassium: 3.6 mmol/L (ref 3.5–5.1)
Sodium: 137 mmol/L (ref 135–145)

## 2021-02-21 LAB — PROTIME-INR
INR: 1.6 — ABNORMAL HIGH (ref 0.8–1.2)
Prothrombin Time: 18.7 seconds — ABNORMAL HIGH (ref 11.4–15.2)

## 2021-02-21 LAB — GLUCOSE, CAPILLARY: Glucose-Capillary: 85 mg/dL (ref 70–99)

## 2021-02-21 MED ORDER — ASPIRIN EC 81 MG PO TBEC: 81.0000 mg | DELAYED_RELEASE_TABLET | Freq: Every day | ORAL | 11 refills | Status: AC

## 2021-02-21 MED ORDER — LACTULOSE 10 GM/15ML PO SOLN: 30.0000 g | Freq: Two times a day (BID) | ORAL | 1 refills | Status: DC

## 2021-02-21 NOTE — Progress Notes (Signed)
Tyler Farmer, M.D. Gastroenterology & Hepatology   Interval History:  No acute events overnight. Patient and wife report that he has improved substantially after having multiple bowel movements yesterday.  Had 2 bowel movements today.  Denies any nausea, vomiting, fever, chills, abdominal distention or abdominal pain. No melena or hematochezia. Labs today showed progressive improvement of his AKI with creatinine of 1.63 but total bilirubin today was 1.8, CBC remains stable with hemoglobin of 9.1.  Inpatient Medications:  Current Facility-Administered Medications:    0.9 %  sodium chloride infusion, , Intravenous, Continuous, Truett Mainland, DO, Last Rate: 75 mL/hr at 02/20/21 1739, New Bag at 02/20/21 1739   carvedilol (COREG) tablet 12.5 mg, 12.5 mg, Oral, BID, Truett Mainland, DO, 12.5 mg at 02/21/21 0831   dicyclomine (BENTYL) capsule 10 mg, 10 mg, Oral, BID PRN, Truett Mainland, DO   ferrous sulfate tablet 325 mg, 325 mg, Oral, Q breakfast, Truett Mainland, DO, 325 mg at 02/21/21 0831   glipiZIDE (GLUCOTROL) tablet 10 mg, 10 mg, Oral, BID AC, Stinson, Jacob J, DO, 10 mg at 02/21/21 0830   insulin aspart (novoLOG) injection 0-5 Units, 0-5 Units, Subcutaneous, QHS, Stinson, Jacob J, DO   insulin aspart (novoLOG) injection 0-9 Units, 0-9 Units, Subcutaneous, TID WC, Stinson, Jacob J, DO, 2 Units at 02/20/21 1841   lactulose (CHRONULAC) 10 GM/15ML solution 30 g, 30 g, Oral, TID, Stinson, Jacob J, DO, 30 g at 02/21/21 0831   levothyroxine (SYNTHROID) tablet 137 mcg, 137 mcg, Oral, Q0600, Truett Mainland, DO, 137 mcg at 02/21/21 0545   magnesium oxide (MAG-OX) tablet 400 mg, 400 mg, Oral, Daily, Truett Mainland, DO, 400 mg at 02/21/21 0831   ondansetron (ZOFRAN) tablet 4 mg, 4 mg, Oral, Q6H PRN **OR** ondansetron (ZOFRAN) injection 4 mg, 4 mg, Intravenous, Q6H PRN, Stinson, Jacob J, DO   pantoprazole (PROTONIX) EC tablet 40 mg, 40 mg, Oral, BID, Truett Mainland, DO, 40 mg at 02/21/21  0831   simvastatin (ZOCOR) tablet 40 mg, 40 mg, Oral, QHS, Truett Mainland, DO, 40 mg at 02/20/21 2232   I/O    Intake/Output Summary (Last 24 hours) at 02/21/2021 1003 Last data filed at 02/20/2021 1300 Gross per 24 hour  Intake 878.45 ml  Output --  Net 878.45 ml     Physical Exam: Temp:  [97.8 F (36.6 C)-98.2 F (36.8 C)] 98.1 F (36.7 C) (12/04 0529) Pulse Rate:  [76-85] 76 (12/04 0529) Resp:  [17-18] 18 (12/04 0529) BP: (105-119)/(53-67) 105/53 (12/04 0529) SpO2:  [97 %-100 %] 99 % (12/04 0529)  Temp (24hrs), Avg:98 F (36.7 C), Min:97.8 F (36.6 C), Max:98.2 F (36.8 C) GENERAL: The patient is AO x3, in no acute distress. HEENT: Head is normocephalic and atraumatic. EOMI are intact. Mouth is well hydrated and without lesions. NECK: Supple. No masses LUNGS: Clear to auscultation. No presence of rhonchi/wheezing/rales. Adequate chest expansion HEART: RRR, normal s1 and s2. ABDOMEN: Soft, nontender, no guarding, no peritoneal signs, and nondistended. BS +. No masses. EXTREMITIES: Without any cyanosis, clubbing, rash, lesions or edema. NEUROLOGIC: AOx3, no focal motor deficit. SKIN: no jaundice, no rashes  Laboratory Data: CBC:     Component Value Date/Time   WBC 2.6 (L) 02/21/2021 0503   RBC 3.02 (L) 02/21/2021 0503   HGB 9.1 (L) 02/21/2021 0503   HCT 29.2 (L) 02/21/2021 0503   PLT 63 (L) 02/21/2021 0503   MCV 96.7 02/21/2021 0503   MCH 30.1 02/21/2021 0503   MCHC  31.2 02/21/2021 0503   RDW 16.2 (H) 02/21/2021 0503   LYMPHSABS 0.6 (L) 02/19/2021 1508   MONOABS 0.3 02/19/2021 1508   EOSABS 0.0 02/19/2021 1508   BASOSABS 0.0 02/19/2021 1508   COAG:  Lab Results  Component Value Date   INR 1.6 (H) 02/21/2021   INR 1.4 (H) 02/19/2021   INR 1.4 (H) 02/13/2021    BMP:  BMP Latest Ref Rng & Units 02/21/2021 02/21/2021 02/20/2021  Glucose 70 - 99 mg/dL 105(H) 107(H) 124(H)  BUN 8 - 23 mg/dL 24(H) 23 30(H)  Creatinine 0.61 - 1.24 mg/dL 1.63(H) 1.61(H) 1.97(H)   BUN/Creat Ratio 6 - 22 (calc) - - -  Sodium 135 - 145 mmol/L 137 138 139  Potassium 3.5 - 5.1 mmol/L 3.6 3.6 4.2  Chloride 98 - 111 mmol/L 110 111 112(H)  CO2 22 - 32 mmol/L 20(L) 20(L) 19(L)  Calcium 8.9 - 10.3 mg/dL 8.5(L) 8.4(L) 8.5(L)    HEPATIC:  Hepatic Function Latest Ref Rng & Units 02/21/2021 02/21/2021 02/19/2021  Total Protein 6.5 - 8.1 g/dL - 7.0 7.8  Albumin 3.5 - 5.0 g/dL 2.9(L) 2.9(L) 3.2(L)  AST 15 - 41 U/L - 22 26  ALT 0 - 44 U/L - 17 19  Alk Phosphatase 38 - 126 U/L - 85 94  Total Bilirubin 0.3 - 1.2 mg/dL - 1.8(H) 1.2    CARDIAC:  Lab Results  Component Value Date   TROPONINI <0.03 10/24/2016      Imaging: I personally reviewed and interpreted the available labs, imaging and endoscopic files.   Assessment/Plan: Tyler Farmer is a 80 y.o. male with with a complex past medical history of Tyler Farmer cirrhosis complicated by nonbleeding grade 2 esophageal varices status post banding, ascites, history of bleeding gastric AVM, peptic ulcer disease status post Billroth II gastrectomy, atrial fibrillation, diabetes, GERD, hyperlipidemia, hypertension, prostate cancer, acute portal vein thrombosis recent evidence of bleeding from gastric ulcer clipped x2, who was admitted to the hospital after presenting worsening hepatic encephalopathy.   The patient presented new onset of hepatic encephalopathy that has clearly responded to the use of lactulose.  He should continue this medication for goal of 2-4 bowel movements per day.  He did not have any infectious or bleeding triggers for his episode.  He can be discharged home with follow-up in the GI clinic for management of his encephalopathy.   Recently, I had a discussion with the wife regarding the possible management of his portal vein thrombosis.  His high risk patient for gastrointestinal bleeding given his esophageal varices and gastric ulcers.  I had a discussion with interventional radiology in the past and it was considered  that he could benefit potentially from TIPS and partial thrombectomy although he could have worsening encephalopathy and is due to concern for significant liver injury.  The wife expressed and the patient stated they would like to pursue the TIPS placement.  However, the patient had worsening MELD score during this admission about 15.  I explained to the patient and the wife that this makes him at high risk of further decompensation if a TIPS is placed and I would not recommend this at this moment.  Both understood and agreed.  He will need to have repeat MELD labs in a week and will discuss his clinical condition with interventional radiology.   Patient has also developed AKI, possibly related to poor oral intake of fluids.  He has responded to IV fluid hydration which he should continue as well as advancing  his diet.     #Decompensated NASH cirrhosis - Daily MELD labs   # Hepatic encephalopathy - Titrate lactulose to achieve 2-4 Bms per day  - Avoid opiates or benzodiazepines   # Acute portal vein thrombosis - Hold off on TIPS evaluation given worsening MELD - Will update Dr. Pascal Lux and Dr. Serafina Royals about this -Will follow repeat CT of the abdomen and pelvis with IV contrast in 3 weeks   # Ascites - Low sodium diet - Diuretics - can restart home dosage upon discharge   # Esophageal varices - Last EGD 02/13/2021 - grade III esophageal varices s/p banding x3 -We will repeat EGD in 3 weeks given the fact that he is not a candidate for TIPS placement at the moment   # FEN - Can take Tylenol max of 2 g per day (650 mg q8h) for pain - Avoid NSAIDs for pain - Protein shake (Boost, Ensure or  every night before going to sleep   Tyler Quale, MD Gastroenterology and Hepatology [facility]

## 2021-02-21 NOTE — Progress Notes (Signed)
Went over discharge instructions with pt and wife. Pt and wife verbalized understanding. Pt discharged and was taken to main entrance via wheelchair by this nurse.

## 2021-02-21 NOTE — Discharge Instructions (Signed)
1)Please take Lactulose as prescribed--goal is to have 2-4 soft/mushy bowel movements daily 2)Avoid ibuprofen/Advil/Aleve/Motrin/Goody Powders/Naproxen/BC powders/Meloxicam/Diclofenac/Indomethacin and other Nonsteroidal anti-inflammatory medications as these will make you more likely to bleed and can cause stomach ulcers, can also cause Kidney problems.  3)Repeat CBC, CMP and PT/INR blood test with Dr. Jenetta Downer in 1 week around 02/25/21 4)Follow up with Dr. Jenetta Downer-- address [address], [place] [address],,[employer] Number [phone] ----you will need repeat upper endoscopy/EGD in about 3 weeks

## 2021-02-21 NOTE — Discharge Summary (Signed)
Tyler Farmer, is a 80 y.o. male  DOB 11/03/1940  MRN [id number].  Admission date:  02/19/2021  Admitting Physician  Truett Mainland, DO  Discharge Date:  02/21/2021   Primary MD  [name], MD  Recommendations for primary care physician for things to follow:   1)Please take Lactulose as prescribed--goal is to have 2-4 soft/mushy bowel movements daily 2)Avoid ibuprofen/Advil/Aleve/Motrin/Goody Powders/Naproxen/BC powders/Meloxicam/Diclofenac/Indomethacin and other Nonsteroidal anti-inflammatory medications as these will make you more likely to bleed and can cause stomach ulcers, can also cause Kidney problems.  3)Repeat CBC, CMP and PT/INR blood test with Dr. Jenetta Downer in 1 week around 02/25/21 4)Follow up with Dr. Jenetta Downer-- address [address], [address], [place] Number [phone] ----you will need repeat upper endoscopy/EGD in about 3 weeks   Admission Diagnosis  Hepatic encephalopathy [K76.82] AKI (acute kidney injury) ([place]) [N17.9] Acute hepatic encephalopathy [K76.82]   Discharge Diagnosis  Hepatic encephalopathy [K76.82] AKI (acute kidney injury) ([place]) [N17.9] Acute hepatic encephalopathy [K76.82]    Principal Problem:   Acute hepatic encephalopathy Active Problems:   NASH Liver Cirrhosis ([place])   Portal vein thrombosis   HTN (hypertension)   DM (diabetes mellitus) (H[facility])   COPD (chronic obstructive pulmonary disease) (H[facility])   UGI bleed      Past Medical History:  Diagnosis Date   Arthritis    Hands/Fingers   Back pain    Cirrhosis of liver (H[facility])    Diabetes mellitus ([place])    Enlarged prostate    Gastric ulcer    GERD (gastroesophageal reflux disease)    H/O bladder problems    Hypercholesteremia    Hypertension    Kidney stone    Prostate cancer ([place])    Thyroid disease    UGI bleed 10/26/2016   Vitamin B12 deficiency 10/26/2016    Past  Surgical History:  Procedure Laterality Date   CIRCUMCISION     at age 47   COLONOSCOPY N/A 11/13/2015   Procedure: COLONOSCOPY;  Surgeon: Rogene Houston, MD;  [facility];  Service: Endoscopy;  Laterality: N/A;  2:10   COLONOSCOPY WITH PROPOFOL N/A 06/17/2020   Procedure: COLONOSCOPY WITH PROPOFOL;  Surgeon: Rogene Houston, MD;  [facility];  Service: Endoscopy;  Laterality: N/A;  AM / patient had positive covid 05/25/20   ESOPHAGEAL BANDING N/A 02/13/2021   Procedure: ESOPHAGEAL BANDING;  Surgeon: Harvel Quale, MD;  [facility];  Service: Gastroenterology;  Laterality: N/A;   ESOPHAGOGASTRODUODENOSCOPY N/A 06/03/2014   Procedure: ESOPHAGOGASTRODUODENOSCOPY (EGD);  Surgeon: Rogene Houston, MD;  [facility];  Service: Endoscopy;  Laterality: N/A;  730   ESOPHAGOGASTRODUODENOSCOPY N/A 10/26/2016   Procedure: ESOPHAGOGASTRODUODENOSCOPY (EGD);  Surgeon: Rogene Houston, MD;  [facility];  Service: Endoscopy;  Laterality: N/A;   ESOPHAGOGASTRODUODENOSCOPY (EGD) WITH PROPOFOL N/A 06/17/2020   Procedure: ESOPHAGOGASTRODUODENOSCOPY (EGD) WITH PROPOFOL;  Surgeon: Rogene Houston, MD;  [facility];  Service: Endoscopy;  Laterality: N/A;   ESOPHAGOGASTRODUODENOSCOPY (  EGD) WITH PROPOFOL N/A 02/13/2021   Procedure: ESOPHAGOGASTRODUODENOSCOPY (EGD) WITH PROPOFOL;  Surgeon: Harvel Quale, MD;  [facility];  Service: Gastroenterology;  Laterality: N/A;   Gastric Ulcer  1993   GIVENS CAPSULE STUDY N/A 10/28/2016   Procedure: GIVENS CAPSULE STUDY;  Surgeon: Rogene Houston, MD;  [facility];  Service: Endoscopy;  Laterality: N/A;   HERNIA REPAIR     POLYPECTOMY  11/13/2015   Procedure: POLYPECTOMY;  Surgeon: Rogene Houston, MD;  [facility];  Service: Endoscopy;;  colon   POLYPECTOMY  06/17/2020   Procedure: POLYPECTOMY;  Surgeon: Rogene Houston, MD;  [facility];   Service: Endoscopy;;  sigmoid   Right Elbow Right    A pin was put in     HPI  from the history and physical done on the day of admission:   .Patient Coming From: home   Chief Complaint: confusion   HPI: Tyler Farmer is a 80 y.o. male with a history of GERD, diabetes, hypertension, cirrhosis of the liver, thyroid disease, paroxysmal atrial fibrillation with prior use of Eliquis.  Patient recently diagnosed with portal vein thrombosis and was admitted from 11/25 until 11/27.  As the patient had a upper GI bleed, the patient was considered a poor candidate for anticoagulation and is being considered for TIPS procedure.  At the patient is confused, history is obtained by the patient's wife.  Patient has been confused since Wednesday and has been increasingly confused.  No palliating or provoking factors.  Patient had been prescribed lactulose and was instructed to titrate the medication.  They picked up the prescription, but the wife is uncertain as to the actual medication that the patient has been taking.  The patient confusion has extended to being confused about where he is events, etc.   Emergency Department Course: Ammonia 120, creatinine 2.26, UA normal, alcohol normal.  CT head and neck pending     Hospital Course:    Brief Narrative:  80 y.o. male with with a complex past medical history of DM2, HTN, GERD, PAFIB-previously on Eliquis, NASH Liver cirrhosis complicated by nonbleeding grade 2 esophageal varices status post banding, ascites, history of bleeding gastric AVM, peptic ulcer disease status post Billroth II gastrectomy, atrial fibrillation, diabetes, GERD, hyperlipidemia, hypertension, prostate cancer, acute portal vein thrombosis recent evidence of bleeding from gastric ulcer clipped x2, admitted on 02/19/2021 with worsening hepatic encephalopathy    A/p 1)NASH liver cirrhosis with hepatic encephalopathy--ammonia 120 on admission, down to 66 with lactulose -Confusion has  resolved and pt is back to baseline according to patient's wife who is at bedside -GI consult appreciated -Continue lactulose -May need rifaximin down the road -History of recurrent ascites, restart Lasix and Aldactone  -INR is up to 1.6   2)Portal vein thrombosis----Dr. Jenetta Downer  intends to talk to IR about possible  TIPS and partial thrombectomy which unfortunately can lead to worsening encephalopathy and also there is concern for significant liver injury with this procedure MELD score will have to be < 15 to have this procedure -MELD score currently too high to allow for above procedure   3) History of esophageal varices--EGD with banding for grade 3 esophageal varices on 02/13/2021 -GI plans to repeat EGD in about 3 weeks   4)Paroxysmal atrial fibrillation--- not on anticoagulation due to recurrent GI bleed -Continue Coreg,  ?? If switch to   to nadolol or propranolol for rate control given portal hypertension   5)DM2-continue  glipizide Use Novolog/Humalog Sliding scale insulin with Accu-Cheks/Fingersticks as ordered    6)Hypothyroidism--- continue levothyroxine   7)GERD-Protonix as ordered   8) pancytopenia--due to underlying liver cirrhosis, Hgb and platelets appear to be close to recent baseline - no obvious bleeding at this time -Repeat CBC in 1 week with Dr. Jenetta Downer   9)AKI----acute kidney injury -suspect due to dehydration -Improved with IV fluids -Admission creatinine was 2.26 currently down to 1.97>>1.61 - baseline is usually around 1.2-1.3 -Repeat CMP in 1 week with Dr. Jenetta Downer     Disposition--Home with wife     Disposition: The patient is from: Home              Anticipated d/c is to: Home                Code Status :  -  Code Status: Full Code    Family Communication:  Discussed with wife at bedside   Consults  :  Gi  Discharge Condition: stable  Follow UP   Follow-up Information     Harvel Quale, MD. Schedule an appointment as  soon as possible for a visit in 1 week(s).   Specialty: Gastroenterology Contact information: [address] [place] [address] [phone]                  Diet and Activity recommendation:  As advised  Discharge Instructions    Discharge Instructions     Call MD for:  difficulty breathing, headache or visual disturbances   Complete by: As directed    Call MD for:  persistant dizziness or light-headedness   Complete by: As directed    Call MD for:  persistant nausea and vomiting   Complete by: As directed    Call MD for:  temperature >100.4   Complete by: As directed    Diet - low sodium heart healthy   Complete by: As directed    Discharge instructions   Complete by: As directed    1)Please take Lactulose as prescribed--goal is to have 2-4 soft/mushy bowel movements daily 2)Avoid ibuprofen/Advil/Aleve/Motrin/Goody Powders/Naproxen/BC powders/Meloxicam/Diclofenac/Indomethacin and other Nonsteroidal anti-inflammatory medications as these will make you more likely to bleed and can cause stomach ulcers, can also cause Kidney problems.  3)Repeat CBC, CMP and PT/INR blood test with Dr. Jenetta Downer in 1 week around 02/25/21 4)Follow up with Dr. Jenetta Downer-- address [address], [place] [address],,[facility] Number [phone] ----you will need repeat upper endoscopy/EGD in about 3 weeks   Increase activity slowly   Complete by: As directed          Discharge Medications     Allergies as of 02/21/2021       Reactions   Penicillins Nausea And Vomiting   Has patient had a PCN reaction causing immediate rash, facial/tongue/throat swelling, SOB or lightheadedness with hypotension: Yes Has patient had a PCN reaction causing severe rash involving mucus membranes or skin necrosis: No Has patient had a PCN reaction that required hospitalization: No Has patient had a PCN reaction occurring within the last 10 years: No If all of the above answers  are "NO", then may proceed with Cephalosporin use. Patient states that it also caused pain in his sides.        Medication List     STOP taking these medications    lactulose 10 g packet Commonly known as: Plover Replaced by: lactulose 10 GM/15ML solution       TAKE these medications  acetaminophen 500 MG tablet Commonly known as: TYLENOL Take 500 mg by mouth every 6 (six) hours as needed.   aspirin EC 81 MG tablet Take 1 tablet (81 mg total) by mouth daily with breakfast. Swallow whole. What changed: when to take this   carvedilol 12.5 MG tablet Commonly known as: Coreg Take 1 tablet (12.5 mg total) by mouth 2 (two) times daily.   Cranberry 500 MG Tabs Take 500 mg by mouth daily.   dicyclomine 10 MG capsule Commonly known as: BENTYL Take 10 mg by mouth 2 (two) times daily as needed for spasms.   ferrous sulfate 325 (65 FE) MG tablet Take 325 mg by mouth daily with breakfast.   furosemide 20 MG tablet Commonly known as: LASIX Take 1 tablet by mouth once daily What changed: how much to take   glipiZIDE 10 MG tablet Commonly known as: GLUCOTROL Take 10 mg by mouth 2 (two) times daily before a meal.   lactulose 10 GM/15ML solution Commonly known as: CHRONULAC Take 45 mLs (30 g total) by mouth 2 (two) times daily. Replaces: lactulose 10 g packet   levothyroxine 150 MCG tablet Commonly known as: SYNTHROID Take 137 mcg by mouth daily before breakfast.   magnesium oxide 400 MG tablet Commonly known as: MAG-OX Take 400 mg by mouth daily.   pantoprazole 40 MG tablet Commonly known as: PROTONIX Take 1 tablet (40 mg total) by mouth 2 (two) times daily.   simvastatin 40 MG tablet Commonly known as: ZOCOR Take 40 mg by mouth at bedtime.   spironolactone 50 MG tablet Commonly known as: ALDACTONE Take 1 tablet (50 mg total) by mouth daily.        Major procedures and Radiology Reports - PLEASE review detailed and final reports for all details, in  brief -    CT Head Wo Contrast  Result Date: 02/19/2021 CLINICAL DATA:  Poly trauma with head and cervical spine injury suspected. EXAM: CT HEAD WITHOUT CONTRAST CT CERVICAL SPINE WITHOUT CONTRAST TECHNIQUE: Multidetector CT imaging of the head and cervical spine was performed following the standard protocol without intravenous contrast. Multiplanar CT image reconstructions of the cervical spine were also generated. COMPARISON:  None. FINDINGS: CT HEAD FINDINGS Brain: There is mild-to-moderate atrophy, small vessel disease and atrophic ventriculomegaly, without midline shift. There is no asymmetry concerning for acute infarct, hemorrhage or mass, or old territorial infarct. Vascular: There are calcifications in the siphons and distal left vertebral artery but no hyperdense central vessels. Skull: Normal. Negative for fracture or focal lesion. Sinuses/Orbits: Unremarkable orbital contents. S shaped nasal septum with right-sided spurring. There is mild membrane disease in the ethmoid and right maxillary sinuses without fluid levels or other significant sinus findings. There is mild patchy fluid in the lower right mastoid air cells. Left mastoids are clear. Other: None. CT CERVICAL SPINE FINDINGS Alignment: There is a minimal grade 1 anterolisthesis at C7-T1 most likely due to facet hypertrophy. No traumatic or further listhesis is seen. Skull base and vertebrae: There is osteopenia without evidence of fractures. There is cervical spondylosis. Marginal osteophytes are most prominent at C5-6 and C6-7, with only minimal marginal osteophytes at the remaining levels. There is narrowing and osteophytosis of the anterior atlantodental joint . Soft tissues and spinal canal: No prevertebral fluid or swelling. No visible canal hematoma.There are patchy calcifications at the carotid bifurcations. Disc levels: Degenerative disc collapse is seen at C5-6 and C6-7, with posterior disc osteophyte complex causing mild ventral  thecal sac encroachment at these  levels without cord compression. Other levels do not show significant disc space loss or soft tissue or bony encroachment on the thecal sac. Upper chest: There are emphysematous and scarring changes in the lung apices. Atrophic thyroid. Other: None. IMPRESSION: 1. no acute intracranial CT findings or depressed skull fractures. 2. Atrophy and small vessel disease. 3. Sinus membrane disease and fluid in the lower right mastoid air cells. 4. Cervical osteopenia and degenerative changes without evidence of fractures. Electronically Signed   By: Telford Nab M.D.   On: 02/19/2021 21:36   CT Cervical Spine Wo Contrast  Result Date: 02/19/2021 CLINICAL DATA:  Poly trauma with head and cervical spine injury suspected. EXAM: CT HEAD WITHOUT CONTRAST CT CERVICAL SPINE WITHOUT CONTRAST TECHNIQUE: Multidetector CT imaging of the head and cervical spine was performed following the standard protocol without intravenous contrast. Multiplanar CT image reconstructions of the cervical spine were also generated. COMPARISON:  None. FINDINGS: CT HEAD FINDINGS Brain: There is mild-to-moderate atrophy, small vessel disease and atrophic ventriculomegaly, without midline shift. There is no asymmetry concerning for acute infarct, hemorrhage or mass, or old territorial infarct. Vascular: There are calcifications in the siphons and distal left vertebral artery but no hyperdense central vessels. Skull: Normal. Negative for fracture or focal lesion. Sinuses/Orbits: Unremarkable orbital contents. S shaped nasal septum with right-sided spurring. There is mild membrane disease in the ethmoid and right maxillary sinuses without fluid levels or other significant sinus findings. There is mild patchy fluid in the lower right mastoid air cells. Left mastoids are clear. Other: None. CT CERVICAL SPINE FINDINGS Alignment: There is a minimal grade 1 anterolisthesis at C7-T1 most likely due to facet hypertrophy. No  traumatic or further listhesis is seen. Skull base and vertebrae: There is osteopenia without evidence of fractures. There is cervical spondylosis. Marginal osteophytes are most prominent at C5-6 and C6-7, with only minimal marginal osteophytes at the remaining levels. There is narrowing and osteophytosis of the anterior atlantodental joint . Soft tissues and spinal canal: No prevertebral fluid or swelling. No visible canal hematoma.There are patchy calcifications at the carotid bifurcations. Disc levels: Degenerative disc collapse is seen at C5-6 and C6-7, with posterior disc osteophyte complex causing mild ventral thecal sac encroachment at these levels without cord compression. Other levels do not show significant disc space loss or soft tissue or bony encroachment on the thecal sac. Upper chest: There are emphysematous and scarring changes in the lung apices. Atrophic thyroid. Other: None. IMPRESSION: 1. no acute intracranial CT findings or depressed skull fractures. 2. Atrophy and small vessel disease. 3. Sinus membrane disease and fluid in the lower right mastoid air cells. 4. Cervical osteopenia and degenerative changes without evidence of fractures. Electronically Signed   By: Telford Nab M.D.   On: 02/19/2021 21:36   MR 3D Recon At Scanner  Result Date: 02/12/2021 CLINICAL DATA:  Right upper quadrant abdominal pain. Recent abnormal abdominal CT and ultrasound with indeterminate gallbladder wall thickening. EXAM: MRI ABDOMEN WITHOUT AND WITH CONTRAST (INCLUDING MRCP) TECHNIQUE: Multiplanar multisequence MR imaging of the abdomen was performed both before and after the administration of intravenous contrast. Dynamic post-contrast sequences were inadvertently obtained in the coronal plane. Heavily T2-weighted images of the biliary and pancreatic ducts were obtained, and three-dimensional MRCP images were rendered by post processing. CONTRAST:  32m GADAVIST GADOBUTROL 1 MMOL/ML IV SOLN COMPARISON:   01/27/2021 right upper quadrant abdominal sonogram and CT abdomen/pelvis. 06/02/2014 MRI abdomen. FINDINGS: Motion degraded scan, limiting assessment. Lower chest: No acute abnormality at  the lung bases. Hepatobiliary: Diffusely mildly irregular liver surface, compatible with cirrhosis. No hepatic steatosis. No liver mass. No gallstones. Gallbladder is nondistended. Mild diffuse gallbladder wall thickening with the suggestion of mild pericholecystic varices along the gallbladder wall adjacent to the liver, unchanged from recent CT. No biliary ductal dilatation. Common bile duct diameter 5-6 mm. No choledocholithiasis. No biliary masses, strictures or beading. Pancreas: Generalized mild pancreatic parenchymal and peripancreatic edema, most prominent in the pancreatic head and neck region. No peripancreatic fluid collections. Preserved pancreatic parenchymal enhancement. No pancreatic mass or duct dilation. No pancreas divisum. Spleen: Mild splenomegaly. Craniocaudal splenic length 14.3 cm. No splenic masses. Adrenals/Urinary Tract: No discrete adrenal nodules. No hydronephrosis. Numerous simple bilateral renal cortical cysts, largest 6.2 cm in the posterior lower left kidney. No suspicious renal masses. Stomach/Bowel: Small hiatal hernia. Stable postsurgical changes from distal gastrectomy with gastrojejunostomy. No acute gastric abnormality. Visualized small and large bowel is normal caliber, with no bowel wall thickening. Vascular/Lymphatic: Atherosclerotic nonaneurysmal abdominal aorta. Acute near occlusive thrombosis of the main portal vein extending into the proximal splenic vein, portosplenic venous confluence and adjacent SMV. Retroaortic left renal vein. Patent renal and hepatic veins. Ill-defined high left para-aortic lymphadenopathy measuring up to 2.5 cm short axis diameter (series 25/image 44), not appreciably changed since 05/08/2020 CT abdomen study. Mild aortocaval adenopathy up to 1.2 cm (series  25/image 72), stable since 05/08/2020 CT. Other: No abdominal ascites or focal fluid collection. Musculoskeletal: No aggressive appearing focal osseous lesions. IMPRESSION: 1. Acute near occlusive thrombosis of the main portal vein extending into the proximal splenic vein, portosplenic venous confluence and adjacent SMV. 2. Generalized mild pancreatic and peripancreatic edema, most prominent in the pancreatic head and neck region, nonspecific, probably secondary to the acute portal vein thrombosis, although acute non-necrotizing pancreatitis is on the differential. No peripancreatic fluid collections. Suggest correlation with serum lipase. 3. Cirrhosis. No liver masses. 4. Mild diffuse gallbladder wall thickening is unchanged and favored to be due to pericholecystic varices. 5. Mild splenomegaly. No ascites. 6. Small hiatal hernia. These results will be called to the ordering clinician or representative by the Radiologist Assistant, and communication documented in the PACS or [employer]. Electronically Signed   By: Ilona Sorrel M.D.   On: 02/12/2021 11:50   DG CHEST PORT 1 VIEW  Result Date: 02/20/2021 CLINICAL DATA:  Altered mental status EXAM: PORTABLE CHEST 1 VIEW COMPARISON:  01/27/2021 FINDINGS: Persistent interstitial prominence similar to the prior study. No new consolidation. No pleural effusion. Stable cardiomediastinal contours. IMPRESSION: Stable nonspecific interstitial changes. Electronically Signed   By: Macy Mis M.D.   On: 02/20/2021 10:18   MR ABDOMEN MRCP W WO CONTAST  Result Date: 02/12/2021 CLINICAL DATA:  Right upper quadrant abdominal pain. Recent abnormal abdominal CT and ultrasound with indeterminate gallbladder wall thickening. EXAM: MRI ABDOMEN WITHOUT AND WITH CONTRAST (INCLUDING MRCP) TECHNIQUE: Multiplanar multisequence MR imaging of the abdomen was performed both before and after the administration of intravenous contrast. Dynamic post-contrast sequences were  inadvertently obtained in the coronal plane. Heavily T2-weighted images of the biliary and pancreatic ducts were obtained, and three-dimensional MRCP images were rendered by post processing. CONTRAST:  76m GADAVIST GADOBUTROL 1 MMOL/ML IV SOLN COMPARISON:  01/27/2021 right upper quadrant abdominal sonogram and CT abdomen/pelvis. 06/02/2014 MRI abdomen. FINDINGS: Motion degraded scan, limiting assessment. Lower chest: No acute abnormality at the lung bases. Hepatobiliary: Diffusely mildly irregular liver surface, compatible with cirrhosis. No hepatic steatosis. No liver mass. No gallstones. Gallbladder is nondistended. Mild diffuse  gallbladder wall thickening with the suggestion of mild pericholecystic varices along the gallbladder wall adjacent to the liver, unchanged from recent CT. No biliary ductal dilatation. Common bile duct diameter 5-6 mm. No choledocholithiasis. No biliary masses, strictures or beading. Pancreas: Generalized mild pancreatic parenchymal and peripancreatic edema, most prominent in the pancreatic head and neck region. No peripancreatic fluid collections. Preserved pancreatic parenchymal enhancement. No pancreatic mass or duct dilation. No pancreas divisum. Spleen: Mild splenomegaly. Craniocaudal splenic length 14.3 cm. No splenic masses. Adrenals/Urinary Tract: No discrete adrenal nodules. No hydronephrosis. Numerous simple bilateral renal cortical cysts, largest 6.2 cm in the posterior lower left kidney. No suspicious renal masses. Stomach/Bowel: Small hiatal hernia. Stable postsurgical changes from distal gastrectomy with gastrojejunostomy. No acute gastric abnormality. Visualized small and large bowel is normal caliber, with no bowel wall thickening. Vascular/Lymphatic: Atherosclerotic nonaneurysmal abdominal aorta. Acute near occlusive thrombosis of the main portal vein extending into the proximal splenic vein, portosplenic venous confluence and adjacent SMV. Retroaortic left renal vein.  Patent renal and hepatic veins. Ill-defined high left para-aortic lymphadenopathy measuring up to 2.5 cm short axis diameter (series 25/image 44), not appreciably changed since 05/08/2020 CT abdomen study. Mild aortocaval adenopathy up to 1.2 cm (series 25/image 72), stable since 05/08/2020 CT. Other: No abdominal ascites or focal fluid collection. Musculoskeletal: No aggressive appearing focal osseous lesions. IMPRESSION: 1. Acute near occlusive thrombosis of the main portal vein extending into the proximal splenic vein, portosplenic venous confluence and adjacent SMV. 2. Generalized mild pancreatic and peripancreatic edema, most prominent in the pancreatic head and neck region, nonspecific, probably secondary to the acute portal vein thrombosis, although acute non-necrotizing pancreatitis is on the differential. No peripancreatic fluid collections. Suggest correlation with serum lipase. 3. Cirrhosis. No liver masses. 4. Mild diffuse gallbladder wall thickening is unchanged and favored to be due to pericholecystic varices. 5. Mild splenomegaly. No ascites. 6. Small hiatal hernia. These results will be called to the ordering clinician or representative by the Radiologist Assistant, and communication documented in the PACS or [employer]. Electronically Signed   By: Ilona Sorrel M.D.   On: 02/12/2021 11:50   CT Angio Abd/Pel W and/or Wo Contrast  Result Date: 02/12/2021 CLINICAL DATA:  Acute mesenteric ischemia, diabetes, cirrhosis, hypertension EXAM: CTA ABDOMEN AND PELVIS WITH CONTRAST TECHNIQUE: Multidetector CT imaging of the abdomen and pelvis was performed using the standard protocol during bolus administration of intravenous contrast. Multiplanar reconstructed images and MIPs were obtained and reviewed to evaluate the vascular anatomy. CONTRAST:  44m OMNIPAQUE IOHEXOL 350 MG/ML SOLN COMPARISON:  MR from earlier the same day, CT 05/08/2020 FINDINGS: VASCULAR Coronary calcifications. Aorta: Moderate  calcified plaque. No aneurysm, dissection, or stenosis. Celiac: Calcified ostial plaque with only mild stenosis, patent distally. SMA: Calcified ostial plaque extending over length of 2 cm resulting in mild stenosis, patent distally. Replaced hepatic arterial supply, an anatomic variant. Renals: Single left with calcified ostial plaque extending over length of at least 1.9 cm resulting in at least mild stenosis, patent distally. Single right, with calcified ostial plaque resulting in short segment stenosis of at least mild severity, patent distally. IMA: Patent without evidence of aneurysm, dissection, vasculitis or significant stenosis. Inflow: Scattered calcified plaque. No aneurysm, dissection, or stenosis. Proximal Outflow: Atheromatous, patent. Veins: Partially occlusive intraluminal thrombus at the portosplenic confluence extending through the length of the main portal vein and into the left portal vein, as was described on earlier MR of the same day, and new since CT 01/27/2021. The splenic vein is  patent. Inferior mesenteric vein is patent. Tributaries of the SMV appear patent. Hepatic veins are patent. Moderate esophageal varices. Chronic large left retroperitoneal splenorenal venous shunt. Retroaortic left renal vein, an anatomic variant. Right renal vein patent. Iliac venous system and IVC unremarkable. Review of the MIP images confirms the above findings. NON-VASCULAR Lower chest: No pleural or pericardial effusion. Visualized lung bases clear. Hepatobiliary: Nodular hepatic contour. No focal liver lesion. Symmetric parenchymal enhancement. Subcentimeter hyperdensities in the dependent aspect of the nondistended gallbladder possibly small partially calcified gallstones. Pancreas: Unremarkable. No pancreatic ductal dilatation or surrounding inflammatory changes. Spleen: Splenomegaly, 15.1 cm in length.  No focal lesion. Adrenals/Urinary Tract: Adrenal glands unremarkable. Bilateral renal cysts, largest  6.5 from the mid left kidney. No urolithiasis or hydronephrosis. Urinary bladder is physiologically distended. Stomach/Bowel: Esophageal varices. No significant gastric varices. Gastrojejunostomy and apparent distal partial gastrectomy. Small bowel and colon are nondilated. Lymphatic: Increased number of borderline enlarged left para-aortic and aortocaval lymph nodes as before. Borderline central mesenteric adenopathy. No pelvic adenopathy. Reproductive: Prostate is unremarkable. Other: Persistent inflammatory/edematous changes in the retroperitoneum posterior to the pancreas. No ascites. No free air. Musculoskeletal: Advanced facet DJD L4-5, allowing grade 1 anterolisthesis. Negative for fracture or worrisome bone lesion. IMPRESSION: 1. Partially occlusive acute/subacute venous thrombus extending from the portosplenic confluence through the length of the main portal vein and into the left portal vein, as seen on earlier MRI, and new since earlier study of 01/27/2021. 2. No new bowel or mesenteric changes to suggest ischemia. 3. Chronic large splenorenal shunt and smaller esophageal varices provide collateral decompression of the mesenteric venous system. 4. Morphologic changes of cirrhosis. 5. Chronic central mesenteric and retroperitoneal adenopathy, nonspecific. 6. Coronary and Aortic Atherosclerosis (ICD10-170.0). Electronically Signed   By: Lucrezia Europe M.D.   On: 02/12/2021 16:43    Micro Results   Recent Results (from the past 240 hour(s))  Resp Panel by RT-PCR (Flu A&B, Covid) Nasopharyngeal Swab     Status: None   Collection Time: 02/12/21  3:16 PM   Specimen: Nasopharyngeal Swab; Nasopharyngeal(NP) swabs in vial transport medium  Result Value Ref Range Status   SARS Coronavirus 2 by RT PCR NEGATIVE NEGATIVE Final    Comment: (NOTE) SARS-CoV-2 target nucleic acids are NOT DETECTED.  The SARS-CoV-2 RNA is generally detectable in upper respiratory specimens during the acute phase of infection. The  lowest concentration of SARS-CoV-2 viral copies this assay can detect is 138 copies/mL. A negative result does not preclude SARS-Cov-2 infection and should not be used as the sole basis for treatment or other patient management decisions. A negative result may occur with  improper specimen collection/handling, submission of specimen other than nasopharyngeal swab, presence of viral mutation(s) within the areas targeted by this assay, and inadequate number of viral copies(<138 copies/mL). A negative result must be combined with clinical observations, patient history, and epidemiological information. The expected result is Negative.  Fact Sheet for Patients:  EntrepreneurPulse.com.au  Fact Sheet for Healthcare Providers:  IncredibleEmployment.be  This test is no t yet approved or cleared by the [place] FDA and  has been authorized for detection and/or diagnosis of SARS-CoV-2 by FDA under an Emergency Use Authorization (EUA). This EUA will remain  in effect (meaning this test can be used) for the duration of the COVID-19 declaration under Section 564(b)(1) of the Act, 21 U.S.C.section 360bbb-3(b)(1), unless the authorization is terminated  or revoked sooner.       Influenza A by PCR NEGATIVE NEGATIVE Final   Influenza  B by PCR NEGATIVE NEGATIVE Final    Comment: (NOTE) The Xpert Xpress SARS-CoV-2/FLU/RSV plus assay is intended as an aid in the diagnosis of influenza from Nasopharyngeal swab specimens and should not be used as a sole basis for treatment. Nasal washings and aspirates are unacceptable for Xpert Xpress SARS-CoV-2/FLU/RSV testing.  Fact Sheet for Patients: EntrepreneurPulse.com.au  Fact Sheet for Healthcare Providers: IncredibleEmployment.be  This test is not yet approved or cleared by the [place] FDA and has been authorized for detection and/or diagnosis of SARS-CoV-2 by FDA under  an Emergency Use Authorization (EUA). This EUA will remain in effect (meaning this test can be used) for the duration of the COVID-19 declaration under Section 564(b)(1) of the Act, 21 U.S.C. section 360bbb-3(b)(1), unless the authorization is terminated or revoked.  Performed at [facility], [address], [place], [place] [address]   Resp Panel by RT-PCR (Flu A&B, Covid) Nasopharyngeal Swab     Status: None   Collection Time: 02/19/21  4:40 PM   Specimen: Nasopharyngeal Swab; Nasopharyngeal(NP) swabs in vial transport medium  Result Value Ref Range Status   SARS Coronavirus 2 by RT PCR NEGATIVE NEGATIVE Final    Comment: (NOTE) SARS-CoV-2 target nucleic acids are NOT DETECTED.  The SARS-CoV-2 RNA is generally detectable in upper respiratory specimens during the acute phase of infection. The lowest concentration of SARS-CoV-2 viral copies this assay can detect is 138 copies/mL. A negative result does not preclude SARS-Cov-2 infection and should not be used as the sole basis for treatment or other patient management decisions. A negative result may occur with  improper specimen collection/handling, submission of specimen other than nasopharyngeal swab, presence of viral mutation(s) within the areas targeted by this assay, and inadequate number of viral copies(<138 copies/mL). A negative result must be combined with clinical observations, patient history, and epidemiological information. The expected result is Negative.  Fact Sheet for Patients:  EntrepreneurPulse.com.au  Fact Sheet for Healthcare Providers:  IncredibleEmployment.be  This test is no t yet approved or cleared by the [place] FDA and  has been authorized for detection and/or diagnosis of SARS-CoV-2 by FDA under an Emergency Use Authorization (EUA). This EUA will remain  in effect (meaning this test can be used) for the duration of the COVID-19 declaration under Section  564(b)(1) of the Act, 21 U.S.C.section 360bbb-3(b)(1), unless the authorization is terminated  or revoked sooner.       Influenza A by PCR NEGATIVE NEGATIVE Final   Influenza B by PCR NEGATIVE NEGATIVE Final    Comment: (NOTE) The Xpert Xpress SARS-CoV-2/FLU/RSV plus assay is intended as an aid in the diagnosis of influenza from Nasopharyngeal swab specimens and should not be used as a sole basis for treatment. Nasal washings and aspirates are unacceptable for Xpert Xpress SARS-CoV-2/FLU/RSV testing.  Fact Sheet for Patients: EntrepreneurPulse.com.au  Fact Sheet for Healthcare Providers: IncredibleEmployment.be  This test is not yet approved or cleared by the [place] FDA and has been authorized for detection and/or diagnosis of SARS-CoV-2 by FDA under an Emergency Use Authorization (EUA). This EUA will remain in effect (meaning this test can be used) for the duration of the COVID-19 declaration under Section 564(b)(1) of the Act, 21 U.S.C. section 360bbb-3(b)(1), unless the authorization is terminated or revoked.  Performed at [facility], [address] [place],  [address]        Today   Subjective    Tyler Farmer today has no new complaints        -Confusion  has resolved and pt is back to baseline according to patient's wife who is at bedside -Eating and drinking well, voiding well, patient already had 2 BMs today   Patient has been seen and examined prior to discharge   Objective   Blood pressure (!) 105/53, pulse 76, temperature 98.1 F (36.7 C), temperature source Oral, resp. rate 18, height 5' 9"  (1.753 m), weight 84.2 kg, SpO2 99 %.   Intake/Output Summary (Last 24 hours) at 02/21/2021 1229 Last data filed at 02/21/2021 0900 Gross per 24 hour  Intake 920 ml  Output --  Net 920 ml    Exam Gen:- Awake Alert, no acute distress , coherent and cooperative HEENT:- [place].AT, No sclera icterus Neck-Supple  Neck,No JVD,.  Lungs-  CTAB , good air movement bilaterally  CV- S1, S2 normal, regular Abd-  +ve B.Sounds, Abd Soft, No tenderness,    Extremity/Skin:- No  edema,   good pulses Psych-affect is appropriate, oriented x3 Neuro-no new focal deficits, no tremors    Data Review   CBC w Diff:  Lab Results  Component Value Date   WBC 2.6 (L) 02/21/2021   HGB 9.1 (L) 02/21/2021   HCT 29.2 (L) 02/21/2021   PLT 63 (L) 02/21/2021   LYMPHOPCT 23 02/19/2021   MONOPCT 12 02/19/2021   EOSPCT 0 02/19/2021   BASOPCT 1 02/19/2021    CMP:  Lab Results  Component Value Date   NA 137 02/21/2021   NA 138 02/21/2021   K 3.6 02/21/2021   K 3.6 02/21/2021   CL 110 02/21/2021   CL 111 02/21/2021   CO2 20 (L) 02/21/2021   CO2 20 (L) 02/21/2021   BUN 24 (H) 02/21/2021   BUN 23 02/21/2021   CREATININE 1.63 (H) 02/21/2021   CREATININE 1.61 (H) 02/21/2021   CREATININE 1.02 08/03/2020   PROT 7.0 02/21/2021   ALBUMIN 2.9 (L) 02/21/2021   ALBUMIN 2.9 (L) 02/21/2021   BILITOT 1.8 (H) 02/21/2021   ALKPHOS 85 02/21/2021   AST 22 02/21/2021   ALT 17 02/21/2021  . Total Discharge time is about 33 minutes  Roxan Hockey M.D on 02/21/2021 at 12:29 PM  Go to [email or web address] -  for contact info  [facility] - Office  [phone]

## 2021-02-22 ENCOUNTER — Telehealth (INDEPENDENT_AMBULATORY_CARE_PROVIDER_SITE_OTHER): Payer: Self-pay | Admitting: *Deleted

## 2021-02-22 ENCOUNTER — Telehealth (INDEPENDENT_AMBULATORY_CARE_PROVIDER_SITE_OTHER): Payer: Self-pay | Admitting: Gastroenterology

## 2021-02-22 DIAGNOSIS — R188 Other ascites: Secondary | ICD-10-CM

## 2021-02-22 NOTE — Telephone Encounter (Signed)
PA required for kristalose 10gm pak. PA was submitted on 12/2 and received fax needing more information. Pt went to ED on 12/2 and med was changed from kistalose 10 gm pak to lactulose solution. Called pharm to make sure pt was able to pick up and pharmacist states he did pick up med.

## 2021-02-22 NOTE — Telephone Encounter (Signed)
Spoke with the patient and wife today regarding discussion held with Dr. Serafina Royals and Pascal Lux.  He may still benefit from TIPS placement and possible SBRO.  We will need to see if his meld score improves.  I asked the wife to have a repeat MELD testing on Wednesday.  She will come to the office and pick up the orders so he can have the blood draw the same day.

## 2021-02-22 NOTE — Telephone Encounter (Signed)
Orders placed at front.

## 2021-02-23 ENCOUNTER — Other Ambulatory Visit (INDEPENDENT_AMBULATORY_CARE_PROVIDER_SITE_OTHER): Payer: Self-pay

## 2021-02-23 DIAGNOSIS — I81 Portal vein thrombosis: Secondary | ICD-10-CM

## 2021-02-24 DIAGNOSIS — R188 Other ascites: Secondary | ICD-10-CM | POA: Diagnosis not present

## 2021-02-24 DIAGNOSIS — K7460 Unspecified cirrhosis of liver: Secondary | ICD-10-CM | POA: Diagnosis not present

## 2021-02-24 NOTE — Telephone Encounter (Signed)
error 

## 2021-02-25 ENCOUNTER — Other Ambulatory Visit: Payer: Self-pay | Admitting: Internal Medicine

## 2021-02-25 ENCOUNTER — Encounter (INDEPENDENT_AMBULATORY_CARE_PROVIDER_SITE_OTHER): Payer: Self-pay

## 2021-02-25 ENCOUNTER — Other Ambulatory Visit (INDEPENDENT_AMBULATORY_CARE_PROVIDER_SITE_OTHER): Payer: Self-pay

## 2021-02-25 DIAGNOSIS — K7581 Nonalcoholic steatohepatitis (NASH): Secondary | ICD-10-CM

## 2021-02-25 DIAGNOSIS — K7460 Unspecified cirrhosis of liver: Secondary | ICD-10-CM

## 2021-02-25 DIAGNOSIS — I10 Essential (primary) hypertension: Secondary | ICD-10-CM

## 2021-02-25 LAB — COMPREHENSIVE METABOLIC PANEL
AG Ratio: 0.7 (calc) — ABNORMAL LOW (ref 1.0–2.5)
ALT: 12 U/L (ref 9–46)
AST: 15 U/L (ref 10–35)
Albumin: 2.9 g/dL — ABNORMAL LOW (ref 3.6–5.1)
Alkaline phosphatase (APISO): 91 U/L (ref 35–144)
BUN/Creatinine Ratio: 14 (calc) (ref 6–22)
BUN: 25 mg/dL (ref 7–25)
CO2: 18 mmol/L — ABNORMAL LOW (ref 20–32)
Calcium: 8.2 mg/dL — ABNORMAL LOW (ref 8.6–10.3)
Chloride: 106 mmol/L (ref 98–110)
Creat: 1.85 mg/dL — ABNORMAL HIGH (ref 0.70–1.22)
Globulin: 4.2 g/dL (calc) — ABNORMAL HIGH (ref 1.9–3.7)
Glucose, Bld: 176 mg/dL — ABNORMAL HIGH (ref 65–139)
Potassium: 4.6 mmol/L (ref 3.5–5.3)
Sodium: 137 mmol/L (ref 135–146)
Total Bilirubin: 1.3 mg/dL — ABNORMAL HIGH (ref 0.2–1.2)
Total Protein: 7.1 g/dL (ref 6.1–8.1)

## 2021-02-25 LAB — PROTIME-INR
INR: 1.3 — ABNORMAL HIGH
Prothrombin Time: 12.9 s — ABNORMAL HIGH (ref 9.0–11.5)

## 2021-02-26 ENCOUNTER — Other Ambulatory Visit: Payer: Self-pay

## 2021-02-26 ENCOUNTER — Ambulatory Visit
Admission: RE | Admit: 2021-02-26 | Discharge: 2021-02-26 | Disposition: A | Discharge status: Home / Self Care | Payer: Medicare Other | Source: Ambulatory Visit | Attending: Internal Medicine | Admitting: Internal Medicine

## 2021-02-26 ENCOUNTER — Encounter: Payer: Self-pay | Admitting: *Deleted

## 2021-02-26 DIAGNOSIS — K7460 Unspecified cirrhosis of liver: Secondary | ICD-10-CM

## 2021-02-26 DIAGNOSIS — K766 Portal hypertension: Secondary | ICD-10-CM | POA: Diagnosis not present

## 2021-02-26 DIAGNOSIS — R531 Weakness: Secondary | ICD-10-CM | POA: Diagnosis not present

## 2021-02-26 DIAGNOSIS — I81 Portal vein thrombosis: Secondary | ICD-10-CM | POA: Diagnosis not present

## 2021-02-26 HISTORY — PX: IR RADIOLOGIST EVAL & MGMT: IMG5224

## 2021-02-26 NOTE — Consult Note (Signed)
Chief Complaint: Portal vein thrombosis  Referring Physician(s): Dr. [name] [place]  History of Present Illness: Tyler Farmer is a 80 y.o. male with history of non-alcoholic steatohepatitis cirrhosis complicated by esophageal varices and portal vein thrombus.  He presents today as a virtual telephone visit as a gracious referral from Dr. Jenetta Downer for evaluation and management of portal thrombus.  Tyler Farmer was first diagnosed with cirrhosis approximately 8-10 years ago.  He has never consumed large quantities of alcohol.  He denies any prior episodes of hematemesis, melena, or hematochezia.  He has a history of minimal ascites which is controlled with diuretics, not requiring any prior paracentesis.   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    He presented to Dr. Jenetta Downer on 01/28/21 with complaints of nausea and epigastric pain.  CT was then ordered which on 01/27/21 which showed a patent portal vein but irregular appearance of the gallbladder prompted an abdominal MRI which on 02/12/21 demonstrated acute portal thrombus.  This prompted a CTA abdomen/pelvis which further characterized the extent of thrombus from the portal confluence to the hilar bifurcation, non-occlusive.  A prominent splenorenal shunt was identified, draining via a retroaortic renal vein.  Repeat EGD was performed by Dr. Jenetta Downer on 02/13/21 which demonstrated grade 3 varices in the lower third of the esophagus which were successfully banded.  A bleeding gastric ulcer was also identified which was successfully clipped.    The patient was then admitted to [facility] on 02/19/21 with hepatic encephalopathy which responded well to lactulose.   He has remained on lactulose since that time with 2-3 bowel movements per day.  He has persistent upper abdominal pain which has worsened over the past 2-3 weeks, though he is not taking any pain medication.  His wife states that he has had less energy over the past 2-3 weeks, but continues to have a good appetite.      Past Medical History:  Diagnosis Date   Arthritis    Hands/Fingers   Back pain    Cirrhosis of liver ([place])    Diabetes mellitus ([place])    Enlarged prostate    Gastric ulcer    GERD (gastroesophageal reflux disease)    H/O bladder problems    Hypercholesteremia    Hypertension    Kidney stone    Prostate cancer ([place])    Thyroid disease    UGI bleed 10/26/2016   Vitamin B12 deficiency 10/26/2016    Past Surgical History:  Procedure Laterality Date   CIRCUMCISION     at age 23   COLONOSCOPY N/A 11/13/2015   Procedure: COLONOSCOPY;  Surgeon: Rogene Houston, MD;  [facility];  Service: Endoscopy;  Laterality: N/A;  2:10   COLONOSCOPY WITH PROPOFOL N/A 06/17/2020   Procedure: COLONOSCOPY WITH PROPOFOL;  Surgeon: Rogene Houston, MD;  [facility];  Service: Endoscopy;  Laterality: N/A;  AM / patient had positive covid 05/25/20   ESOPHAGEAL BANDING N/A 02/13/2021   Procedure: ESOPHAGEAL BANDING;  Surgeon: Harvel Quale, MD;  [facility];  Service: Gastroenterology;  Laterality: N/A;   ESOPHAGOGASTRODUODENOSCOPY N/A 06/03/2014   Procedure: ESOPHAGOGASTRODUODENOSCOPY (EGD);  Surgeon: Rogene Houston, MD;  [facility];  Service: Endoscopy;  Laterality: N/A;  730   ESOPHAGOGASTRODUODENOSCOPY N/A 10/26/2016   Procedure: ESOPHAGOGASTRODUODENOSCOPY (EGD);  Surgeon: Rogene Houston, MD;  [facility];  Service: Endoscopy;  Laterality: N/A;   ESOPHAGOGASTRODUODENOSCOPY (EGD) WITH PROPOFOL N/A 06/17/2020   Procedure: ESOPHAGOGASTRODUODENOSCOPY (EGD) WITH PROPOFOL;  Surgeon: Rogene Houston, MD;  [facility];  Service: Endoscopy;  Laterality: N/A;   ESOPHAGOGASTRODUODENOSCOPY (EGD) WITH PROPOFOL N/A 02/13/2021   Procedure: ESOPHAGOGASTRODUODENOSCOPY (EGD) WITH PROPOFOL;  Surgeon: Harvel Quale, MD;  [facility];  Service: Gastroenterology;  Laterality: N/A;   Gastric Ulcer  1993   GIVENS CAPSULE STUDY N/A 10/28/2016   Procedure: GIVENS CAPSULE STUDY;  Surgeon: Rogene Houston, MD;  [facility];  Service: Endoscopy;  Laterality: N/A;   HERNIA REPAIR     POLYPECTOMY  11/13/2015   Procedure: POLYPECTOMY;  Surgeon: Rogene Houston, MD;  [facility];  Service: Endoscopy;;  colon   POLYPECTOMY  06/17/2020   Procedure: POLYPECTOMY;  Surgeon: Rogene Houston, MD;  [facility];  Service: Endoscopy;;  sigmoid   Right Elbow Right    A pin was put in    Allergies: Penicillins  Medications: Prior to Admission medications   Medication Sig Start Date End Date Taking? Authorizing Provider  acetaminophen (TYLENOL) 500 MG tablet Take 500 mg by mouth every 6 (six) hours as needed.    [provider]  aspirin EC 81 MG tablet Take 1 tablet (81 mg total) by mouth daily with breakfast. Swallow whole. 02/21/21   Roxan Hockey, MD  carvedilol (COREG) 12.5 MG tablet Take 1 tablet (12.5 mg total) by mouth 2 (two) times daily. 02/14/21 02/14/22  Manuella Ghazi, Pratik D, DO  Cranberry 500 MG TABS Take 500 mg by mouth daily.    [provider]  dicyclomine (BENTYL) 10 MG capsule Take 10 mg by mouth 2 (two) times daily as needed for spasms. 07/14/20   Rogene Houston, MD  ferrous sulfate 325 (65 FE) MG tablet Take 325 mg by mouth daily with breakfast.    [provider]  furosemide (LASIX) 20 MG tablet Take 1 tablet by mouth once daily Patient taking differently: Take 40 mg by mouth daily. 10/19/20   Arnoldo Lenis, MD  glipiZIDE (GLUCOTROL) 10 MG tablet Take 10 mg by mouth 2 (two) times daily before a meal.    [provider]   lactulose (CHRONULAC) 10 GM/15ML solution Take 45 mLs (30 g total) by mouth 2 (two) times daily. 02/21/21   Roxan Hockey, MD  levothyroxine (SYNTHROID) 150 MCG tablet Take 137 mcg by mouth daily before breakfast.    [provider]  magnesium oxide (MAG-OX) 400 MG tablet Take 400 mg by mouth daily.    [provider]  pantoprazole (PROTONIX) 40 MG tablet Take 1 tablet (40 mg total) by mouth 2 (two) times daily. 02/14/21 03/16/21  Manuella Ghazi, Pratik D, DO  simvastatin (ZOCOR) 40 MG tablet Take 40 mg by mouth at bedtime.     [provider]  spironolactone (ALDACTONE) 50 MG tablet Take 1 tablet (50 mg total) by mouth daily. 05/10/20   Rogene Houston, MD     Family History  Problem Relation Age of Onset   Emphysema Mother    Cerebrovascular Accident Father    Heart disease Father    Diabetes Sister    Heart disease Brother    Heart disease Sister    Heart disease Brother    Diabetes Brother  Social History   Socioeconomic History   Marital status: Married    Spouse name: Not on file   Number of children: Not on file   Years of education: Not on file   Highest education level: Not on file  Occupational History   Not on file  Tobacco Use   Smoking status: Former    Types: Cigarettes    Quit date: 05/25/1989    Years since quitting: 31.7   Smokeless tobacco: Former    Quit date: 01/16/1990  Vaping Use   Vaping Use: Never used  Substance and Sexual Activity   Alcohol use: No    Alcohol/week: 0.0 standard drinks    Comment: Drank many years - 30 years ago   Drug use: No   Sexual activity: Not on file  Other Topics Concern   Not on file  Social History Narrative   Not on file   Social Determinants of Health   Financial Resource Strain: Not on file  Food Insecurity: Not on file  Transportation Needs: Not on file  Physical Activity: Not on file  Stress: Not on file  Social Connections: Not on file    Review of Systems: A 12 point ROS  discussed and pertinent positives are indicated in the HPI above.  All other systems are negative.   Vital Signs: There were no vitals taken for this visit.  No physical examination in lieu of telephone visit.  Imaging: CTA [facility] 02/12/21     Echocardiogram (04/14/20) Left ventricular ejection fraction, by estimation, is 60 to 65%. The left ventricle has normal function. The left ventricle has no regional wall motion abnormalities. There is mild left ventricular hypertrophy. Left ventricular diastolic parameters are indeterminate. 2. Right ventricular systolic function is normal. The right ventricular size is normal. There is mildly elevated pulmonary artery systolic pressure. 3. Left atrial size was severely dilated. 4. The mitral valve is normal in structure. Mild mitral valve regurgitation. No evidence of mitral stenosis. 5. The aortic valve is tricuspid. Aortic valve regurgitation is not visualized. No aortic stenosis is present. 6. The inferior vena cava is dilated in size with >50% respiratory variability, suggesting right atrial pressure of 8 mmHg.    Labs:  CBC: Recent Labs    02/14/21 0305 02/19/21 1508 02/20/21 0348 02/21/21 0503  WBC 3.5* 2.7* 3.9* 2.6*  HGB 9.3* 9.6* 9.0* 9.1*  HCT 30.6* 30.7* 28.6* 29.2*  PLT 107* 73* 77* 63*    COAGS: Recent Labs    02/13/21 0442 02/19/21 1508 02/21/21 0503 02/24/21 0956  INR 1.4* 1.4* 1.6* 1.3*    BMP: Recent Labs    02/14/21 0305 02/19/21 1508 02/20/21 0348 02/21/21 0503 02/24/21 0956  NA 137 137 139 137  138 137  K 5.1 4.7 4.2 3.6  3.6 4.6  CL 106 106 112* 110  111 106  CO2 24 22 19* 20*  20* 18*  GLUCOSE 139* 129* 124* 105*  107* 176*  BUN 20 33* 30* 24*  23 25  CALCIUM 8.8* 8.6* 8.5* 8.5*  8.4* 8.2*  CREATININE 1.25* 2.26* 1.97* 1.63*  1.61* 1.85*  GFRNONAA 58* 29* 34* 42*  43*  --     LIVER FUNCTION TESTS: Recent Labs    02/12/21 1439 02/13/21 0442 02/19/21 1508 02/21/21 0503  02/24/21 0956  BILITOT 0.7 0.7 1.2 1.8* 1.3*  AST 29 25 26 22 15   ALT 21 17 19 17 12   ALKPHOS 113 92 94 85  --   PROT 7.7 6.7  7.8 7.0 7.1  ALBUMIN 3.1* 2.6* 3.2* 2.9*  2.9*  --     TUMOR MARKERS: Recent Labs    05/05/20 1043 01/28/21 1441  AFPTM 1.0 0.6   MELD-Na = 16 Child Class B (7 pts)   Assessment and Plan: 80 year old male with history of Childs Pugh B, NASH cirrhosis and portal hypertension complicated by esophageal varices, splenorenal shunt, and acute, non-occlusive portal vein thrombus.  His recent hepatic encephalopathy is like secondary to patent splenorenal shunt with increasing retrograde/stagnant portal venous flow due to enlarging acute thrombus.  Given evidence of recent bleeding from grade III esophageal varices, he has been taken off anticoagulation and only taking aspirin.    I discussed in depth the concept and procedural aspects of TIPS creation as well as variceal coil embolization and aspiration thrombectomy.  Risks and benefits of TIPS and/or additional variceal embolization were discussed with the patient and/or the patient's family including, but not limited to, infection, bleeding, damage to adjacent structures, worsening hepatic and/or cardiac function, worsening and/or the development of altered mental status/encephalopathy, non-target embolization and death.   At this point, Tyler Farmer and his wife would like to proceed with TIPS creation with possible portal vein aspiration thrombectomy and splenorenal shunt embolization.  This will be with general anesthesia at [facility] with expected overnight observation.      Electronically Signed: Suzette Battiest, MD 02/26/2021, 12:35 PM   I spent a total of  60 Minutes  in telephone clinical consultation, greater than 50% of which was counseling/coordinating care for portal hypertension.

## 2021-03-02 ENCOUNTER — Encounter (INDEPENDENT_AMBULATORY_CARE_PROVIDER_SITE_OTHER): Payer: Self-pay

## 2021-03-02 ENCOUNTER — Other Ambulatory Visit (HOSPITAL_COMMUNITY): Payer: Self-pay | Admitting: Interventional Radiology

## 2021-03-02 DIAGNOSIS — K7581 Nonalcoholic steatohepatitis (NASH): Secondary | ICD-10-CM

## 2021-03-02 NOTE — Telephone Encounter (Signed)
error 

## 2021-03-03 ENCOUNTER — Telehealth (INDEPENDENT_AMBULATORY_CARE_PROVIDER_SITE_OTHER): Payer: Self-pay

## 2021-03-03 NOTE — Telephone Encounter (Signed)
I called and left a message for the patient to return call.  Patient wife called today stating she has some question regarding the Lactulose. She said on her vm the patient was taking 30 g in the am and was having 2 bm per day on average. He is still confused at times, but getting better. She wants to know if the patient needs to go more than two times per day.   For now I will prescribe him lactulose to achieve a goal of 2-3 bowel movements per day.  He has not present any worsening ascites so he should continue on his same dose of furosemide 20 mg every day and spironolactone 50 mg every day and he should continue with carvedilol 12.5 mg twice a day.   We will cancel his esophagogastroduodenospy as he has not present any other symptoms of recurrent bleeding clinically and he will proceed with TIPS so he will not need any further banding.   - Continue carvedilol 12.5 mg twice a day for now - Start lactulose, titrate to achieve 2-4 bowel movements per day - Continue furosemide 20 mg qday - Continue spironolactone 50 mg qday - Will reach interventional radiology to schedule TIPS placement a possible partial thrombectomy - Cancel EGD  es per day?

## 2021-03-04 ENCOUNTER — Other Ambulatory Visit (HOSPITAL_COMMUNITY): Payer: Self-pay | Admitting: *Deleted

## 2021-03-04 ENCOUNTER — Other Ambulatory Visit: Payer: Self-pay | Admitting: Radiology

## 2021-03-04 ENCOUNTER — Encounter (HOSPITAL_COMMUNITY): Payer: Self-pay | Admitting: Interventional Radiology

## 2021-03-04 ENCOUNTER — Other Ambulatory Visit: Payer: Self-pay

## 2021-03-04 NOTE — Anesthesia Preprocedure Evaluation (Addendum)
Anesthesia Evaluation  Patient identified by MRN, date of birth, ID band Patient awake    Reviewed: Allergy & Precautions, NPO status , Patient's Chart, lab work & pertinent test results  History of Anesthesia Complications Negative for: history of anesthetic complications  Airway Mallampati: II  TM Distance: >3 FB Neck ROM: Full    Dental  (+) Edentulous Upper, Missing   Pulmonary asthma , COPD, former smoker,    Pulmonary exam normal        Cardiovascular hypertension, Pt. on medications and Pt. on home beta blockers Normal cardiovascular exam+ dysrhythmias Atrial Fibrillation    Echo 04/14/20:  LVEF 60-65%, mild LVH, mildly elevated PASP, severely dilated LA, mild MR   Neuro/Psych negative neurological ROS     GI/Hepatic PUD, GERD  ,(+) Cirrhosis   Esophageal Varices    ,   Endo/Other  diabetes, Type 2Hypothyroidism   Renal/GU Renal InsufficiencyRenal disease  negative genitourinary   Musculoskeletal  (+) Arthritis ,   Abdominal   Peds  Hematology  (+) anemia ,   Anesthesia Other Findings   Reproductive/Obstetrics                           Anesthesia Physical Anesthesia Plan  ASA: 4  Anesthesia Plan: General   Post-op Pain Management: Minimal or no pain anticipated   Induction: Intravenous  PONV Risk Score and Plan: 2 and Ondansetron, Dexamethasone, Treatment may vary due to age or medical condition and Midazolam  Airway Management Planned: Oral ETT  Additional Equipment: Arterial line  Intra-op Plan:   Post-operative Plan: Extubation in OR  Informed Consent: I have reviewed the patients History and Physical, chart, labs and discussed the procedure including the risks, benefits and alternatives for the proposed anesthesia with the patient or authorized representative who has indicated his/her understanding and acceptance.     Dental advisory given  Plan Discussed  with:   Anesthesia Plan Comments: (PAT note written 03/04/2021 by Myra Gianotti, PA-C. )       Anesthesia Quick Evaluation

## 2021-03-04 NOTE — Telephone Encounter (Signed)
I called and left a message asked that patient wife please call the office.

## 2021-03-04 NOTE — Telephone Encounter (Signed)
Please advise 

## 2021-03-04 NOTE — Telephone Encounter (Signed)
That is acceptable as long as he has 2 Bms every day, if less than that she needs to give him the dose twice a day

## 2021-03-04 NOTE — Progress Notes (Signed)
Spoke with pt's wife, Tyler Farmer for pre-op call. Pt was in same room with her. Pt has hx of A-fib and is on Aspirin 81 mg. He was on Eliquis but had a GI bleed and was taken off of it. Pt's cardiologist is Dr. Harl Bowie, last office visit was 01/23/21.   Pt is diabetic. Last A1C was 6.8 on 02/12/21. Tyler Farmer states pt does not check his blood sugar at home.  Instructed her to hold Glipizide tonight and in the AM.   Will need Covid test on arrival.

## 2021-03-04 NOTE — Progress Notes (Signed)
Anesthesia Chart Review: SAME DAY WORK-UP]  Case: [id number] Date/Time: 03/05/21 0945   Procedure: RADIOLOGY WITH ANESTHESIA    I.R. Rise Paganini   Anesthesia type: General   Pre-op diagnosis: NASH   Location: [facility] OR RADIOLOGY ROOM / [place]   Surgeons: Suzette Battiest, MD       DISCUSSION: Patient is an 80 year old male scheduled for the above procedure.  History includes former smoker (quit 05/25/89), HTN, afib/PAF, DM2, hypercholesterolemia, GERD, liver cirrhosis (likely from NAFLD, diagnosed 2016; splenomegaly, pancytopenia with portal hypertension, acute near occlusive thrombosis of the main portal vein extending into the proximal splenic vein 02/12/21, chronic large splenorenal shunt 02/12/21; esophageal varies s/p banding 02/13/21), GI bleed (1991 s/p Billroth II gastrectomy for PUD; 10/2016 likely due to duodenal AVM; 01/2021 s/p clips for oozing gastric ulcer), anemia, prostate cancer (s/p radiation ~ 2011), back pain.  - Admission [facility] 02/19/21-02/21/21 for acute hepatic encephalopathy, AKI.  Ammonia 120 on admission. Started on lactulose and down to 66.  Confusion resolved.   Creatinine up to 2.26, down to 1.61 by discharge (Cr 1.25-1.63 the month prior). Restarted on Lasix and Aldactone for recurrent ascites. GI Dr. Jenetta Downer to talk with IR regarding possibility of TIPS procedure and partial thrombectomy given recent diagnosis of portal vein thrombosis.  He saw IR Dr. Serafina Royals on 02/26/21. He felt recent hepatic encephalopathy likely secondary to patent splenorenal shunt with increasing retrograde/stagnant portal venous flow due to enlarging acute thrombus. Because of recent GI bleed with known grade III esophageal varices, he had been taken off anticoagulation and placed on ASA. He "discussed in depth the concept and procedural aspects of TIPS creation as well as variceal coil embolization and aspiration thrombectomy." Patient and his wife desired to proceed. Overnight stay is anticipated.   Last  visit with cardiologist Dr. Harl Bowie 02/02/2021. Initially saw 04/10/20 for dyspnea with abdominal distention and LE edema in setting of known cirrhosis. PVCs on EKG. Had echo and 14 day Zio monitor. Echo showed LVEF 60-65%, mild LVH, mildly elevated PASP, severely dilated LA, mild MR. Ziopatch showed "multiple episodes, 6768 episodes, of SVT. Two episodes of high grade AV block were seen. One episode of VT lasting 4 beats. Frequent isolated supraventricular ectopy were noted. At 5.3%. Ventricular bigeminy and trigeminy were present." His PCP had started him on for Eliquis, but given GI bleed and esophageal varies and thrombocytopenia, he had been switched to ASA. He was in afib at 02/02/21 visit with Dr. Harl Bowie. Occasional/fairly infrequent dull chest pain for 1 second, thought likely associated with RVR episodes. Started Coreg for better rate control, and may consider further evaluation if worsening symptoms. Dr. Harl Bowie planned to follow-up with GI to see when/if patient considered safe to resume anticoagulation. Patient was also scheduled for EGD at that time, so Dr. Harl Bowie did advise, "careful dosing of av nodal agents, had some mild pauses on zio patch".   He has known thrombocytopenia (PLT 63-113 since 02/12/21). HGB 8.6-9.6 range. PT/INR 12.9/1.3 on 02/24/21. He is for updated labs on arrival per IR.    VS: BP 105/2, HR 76 02/21/21   PROVIDERS: Sasser, Silvestre Moment, MD is PCP Irine Seal, MD is urologist Carlyle Dolly, MD is cardiologist Montez Morita, MD is GI. Had seen Hildred Laser, MD previously.    LABS: For day of procedure as ordered. Last lab results include:  Lab Results  Component Value Date   WBC 2.6 (L) 02/21/2021   HGB 9.1 (L) 02/21/2021   HCT 29.2 (L) 02/21/2021  PLT 63 (L) 02/21/2021   GLUCOSE 176 (H) 02/24/2021   ALT 12 02/24/2021   AST 15 02/24/2021   NA 137 02/24/2021   K 4.6 02/24/2021   CL 106 02/24/2021   CREATININE 1.85 (H) 02/24/2021   BUN 25 02/24/2021    CO2 18 (L) 02/24/2021   INR 1.3 (H) 02/24/2021   HGBA1C 6.8 (H) 02/12/2021    OTHER: EGD 02/13/21: Impression: - Grade III esophageal varices. Completely eradicated. Banded. - Oozing gastric ulcer with oozing hemorrhage (Forrest Class Ib). Clips were placed. Biopsied. [Gastric oxyntic mucosa with features of reactive gastropathy. Negative for H. pylori, dysplasia, malignancy] - Patent Billroth II gastrojejunostomy was found, characterized by healthy appearing mucosa. - Normal examined duodenum. - Recommend: Pantoprazole 40 mg po BID. Repeat upper endoscopy in 4 weeks for surveillance.   Colonoscopy 06/17/20: Impression: - Congested mucosa at the hepatic flexure, in the ascending colon and in the cecum. - Diverticulosis in the sigmoid colon. - One 3 mm polyp in the sigmoid colon, removed with a cold snare. Complete resection. Polyp tissue not retrieved. - External hemorrhoids.   IMAGES: 1V PCXR 02/20/21: FINDINGS: Persistent interstitial prominence similar to the prior study. No new consolidation. No pleural effusion. Stable cardiomediastinal contours. IMPRESSION: Stable nonspecific interstitial changes.   CT head/c-spine 02/19/21: IMPRESSION: 1. no acute intracranial CT findings or depressed skull fractures. 2. Atrophy and small vessel disease. 3. Sinus membrane disease and fluid in the lower right mastoid air cells. 4. Cervical osteopenia and degenerative changes without evidence of fractures.   CTA abd/pelvis 02/12/21: IMPRESSION: 1. Partially occlusive acute/subacute venous thrombus extending from the portosplenic confluence through the length of the main portal vein and into the left portal vein, as seen on earlier MRI, and new since earlier study of 01/27/2021. 2. No new bowel or mesenteric changes to suggest ischemia. 3. Chronic large splenorenal shunt and smaller esophageal varices provide collateral decompression of the mesenteric venous system. 4. Morphologic  changes of cirrhosis. 5. Chronic central mesenteric and retroperitoneal adenopathy, nonspecific. 6. Coronary and Aortic Atherosclerosis (ICD10-170.0).   MR Abd 02/12/21: IMPRESSION: 1. Acute near occlusive thrombosis of the main portal vein extending into the proximal splenic vein, portosplenic venous confluence and adjacent SMV. 2. Generalized mild pancreatic and peripancreatic edema, most prominent in the pancreatic head and neck region, nonspecific, probably secondary to the acute portal vein thrombosis, although acute non-necrotizing pancreatitis is on the differential. No peripancreatic fluid collections. Suggest correlation with serum lipase. 3. Cirrhosis. No liver masses. 4. Mild diffuse gallbladder wall thickening is unchanged and favored to be due to pericholecystic varices. 5. Mild splenomegaly. No ascites. 6. Small hiatal hernia.   EKG: 02/19/21: Atrial fibrillation at 86 bpm Borderline T abnormalities, inferior leads since last tracing no significant change Confirmed by Daleen Bo [phone]) on 02/19/2021 4:10:03 PM   CV: Long term event monitor 05/09/20-05/23/20: Patch Wear Time:  14 days and 0 hours (2022-02-19T10:42:58-0500 to 2022-03-05T10:43:04-498) Patient had a min HR of 21 bpm, max HR of 210 bpm, and avg HR of 105 bpm. Predominant underlying rhythm was Sinus Rhythm. 1 run of Ventricular Tachycardia occurred lasting 4 beats with a max rate of 197 bpm (avg 155 bpm). 6768 Supraventricular  Tachycardia runs occurred, the run with the fastest interval lasting 6 beats with a max rate of 210 bpm, the longest lasting 20 beats with an avg rate of 165 bpm. 2 episode(s) of AV Block (High Grade) occurred, lasting a total of 5 secs. Difficulty  discerning atrial activity making definitive  diagnosis difficult to ascertain. Supraventricular Tachycardia was detected within +/- 45 seconds of symptomatic patient event(s). Isolated SVEs were frequent (5.3%, [id number]), SVE Couplets were  occasional  (2.3%, 23466), and SVE Triplets were occasional (1.4%, 9517). Isolated VEs were rare (<1.0%, 11028), VE Couplets were rare (<1.0%, 435), and VE Triplets were rare (<1.0%, 2). Ventricular Bigeminy and Trigeminy were present.   Echo 04/14/20: IMPRESSIONS   1. Left ventricular ejection fraction, by estimation, is 60 to 65%. The  left ventricle has normal function. The left ventricle has no regional  wall motion abnormalities. There is mild left ventricular hypertrophy.  Left ventricular diastolic parameters  are indeterminate.   2. Right ventricular systolic function is normal. The right ventricular  size is normal. There is mildly elevated pulmonary artery systolic  pressure.   3. Left atrial size was severely dilated.   4. The mitral valve is normal in structure. Mild mitral valve  regurgitation. No evidence of mitral stenosis.   5. The aortic valve is tricuspid. Aortic valve regurgitation is not  visualized. No aortic stenosis is present.   6. The inferior vena cava is dilated in size with >50% respiratory  variability, suggesting right atrial pressure of 8 mmHg.    Past Medical History:  Diagnosis Date   Arthritis    Hands/Fingers   Back pain    Cirrhosis of liver ([place])    Diabetes mellitus ([place])    Enlarged prostate    Gastric ulcer    GERD (gastroesophageal reflux disease)    H/O bladder problems    Hypercholesteremia    Hypertension    Kidney stone    Prostate cancer ([place])    Thyroid disease    UGI bleed 10/26/2016   Vitamin B12 deficiency 10/26/2016    Past Surgical History:  Procedure Laterality Date   CIRCUMCISION     at age 26   COLONOSCOPY N/A 11/13/2015   Procedure: COLONOSCOPY;  Surgeon: Rogene Houston, MD;  [facility];  Service: Endoscopy;  Laterality: N/A;  2:10   COLONOSCOPY WITH PROPOFOL N/A 06/17/2020   Procedure: COLONOSCOPY WITH PROPOFOL;  Surgeon: Rogene Houston, MD;  Location: [facility];  Service: Endoscopy;   Laterality: N/A;  AM / patient had positive covid 05/25/20   ESOPHAGEAL BANDING N/A 02/13/2021   Procedure: ESOPHAGEAL BANDING;  Surgeon: Harvel Quale, MD;  Location: [facility];  Service: Gastroenterology;  Laterality: N/A;   ESOPHAGOGASTRODUODENOSCOPY N/A 06/03/2014   Procedure: ESOPHAGOGASTRODUODENOSCOPY (EGD);  Surgeon: Rogene Houston, MD;  [facility];  Service: Endoscopy;  Laterality: N/A;  730   ESOPHAGOGASTRODUODENOSCOPY N/A 10/26/2016   Procedure: ESOPHAGOGASTRODUODENOSCOPY (EGD);  Surgeon: Rogene Houston, MD;  [facility];  Service: Endoscopy;  Laterality: N/A;   ESOPHAGOGASTRODUODENOSCOPY (EGD) WITH PROPOFOL N/A 06/17/2020   Procedure: ESOPHAGOGASTRODUODENOSCOPY (EGD) WITH PROPOFOL;  Surgeon: Rogene Houston, MD;  [facility];  Service: Endoscopy;  Laterality: N/A;   ESOPHAGOGASTRODUODENOSCOPY (EGD) WITH PROPOFOL N/A 02/13/2021   Procedure: ESOPHAGOGASTRODUODENOSCOPY (EGD) WITH PROPOFOL;  Surgeon: Harvel Quale, MD;  [facility];  Service: Gastroenterology;  Laterality: N/A;   Gastric Ulcer  1993   GIVENS CAPSULE STUDY N/A 10/28/2016   Procedure: GIVENS CAPSULE STUDY;  Surgeon: Rogene Houston, MD;  [facility];  Service: Endoscopy;  Laterality: N/A;   HERNIA REPAIR     IR RADIOLOGIST EVAL & MGMT  02/26/2021   POLYPECTOMY  11/13/2015   Procedure: POLYPECTOMY;  Surgeon: Rogene Houston,  MD;  [facility];  Service: Endoscopy;;  colon   POLYPECTOMY  06/17/2020   Procedure: POLYPECTOMY;  Surgeon: Rogene Houston, MD;  [facility];  Service: Endoscopy;;  sigmoid   Right Elbow Right    A pin was put in    MEDICATIONS: No current facility-administered medications for this encounter.    acetaminophen (TYLENOL) 500 MG tablet   aspirin EC 81 MG tablet   carvedilol (COREG) 12.5 MG tablet   Cranberry 500 MG TABS   dicyclomine (BENTYL) 10 MG capsule   ferrous sulfate 325 (65 FE)  MG tablet   furosemide (LASIX) 20 MG tablet   glipiZIDE (GLUCOTROL) 10 MG tablet   lactulose (CHRONULAC) 10 GM/15ML solution   levothyroxine (SYNTHROID) 150 MCG tablet   magnesium oxide (MAG-OX) 400 MG tablet   pantoprazole (PROTONIX) 40 MG tablet   simvastatin (ZOCOR) 40 MG tablet   spironolactone (ALDACTONE) 50 MG tablet    Myra Gianotti, PA-C Surgical Short Stay/Anesthesiology [facility] Phone [phone] [facility] Phone [phone] 03/04/2021 2:57 PM

## 2021-03-04 NOTE — Telephone Encounter (Signed)
Patient wife called back and states the patient is only taking the lactulose 30 g once in the am daily, she states he is having around two bm's every am  also. Would like to know if this is acceptable or does he need to go more. Please advise.

## 2021-03-05 ENCOUNTER — Inpatient Hospital Stay (HOSPITAL_COMMUNITY): Payer: Medicare Other

## 2021-03-05 ENCOUNTER — Encounter (HOSPITAL_COMMUNITY)
Admission: RE | Disposition: A | Discharge status: Home / Self Care | Payer: Self-pay | Source: Home / Self Care | Attending: Interventional Radiology

## 2021-03-05 ENCOUNTER — Other Ambulatory Visit: Payer: Self-pay

## 2021-03-05 ENCOUNTER — Inpatient Hospital Stay (HOSPITAL_COMMUNITY): Payer: Medicare Other | Admitting: Vascular Surgery

## 2021-03-05 ENCOUNTER — Encounter (HOSPITAL_COMMUNITY): Payer: Self-pay | Admitting: Interventional Radiology

## 2021-03-05 ENCOUNTER — Inpatient Hospital Stay (HOSPITAL_COMMUNITY)
Admission: RE | Admit: 2021-03-05 | Discharge: 2021-03-06 | DRG: 405 | Disposition: A | Discharge status: Home / Self Care | Payer: Medicare Other | Attending: Interventional Radiology | Admitting: Interventional Radiology

## 2021-03-05 DIAGNOSIS — Z79890 Hormone replacement therapy (postmenopausal): Secondary | ICD-10-CM | POA: Diagnosis not present

## 2021-03-05 DIAGNOSIS — E7800 Pure hypercholesterolemia, unspecified: Secondary | ICD-10-CM | POA: Diagnosis present

## 2021-03-05 DIAGNOSIS — Z7982 Long term (current) use of aspirin: Secondary | ICD-10-CM | POA: Diagnosis not present

## 2021-03-05 DIAGNOSIS — Z880 Allergy status to penicillin: Secondary | ICD-10-CM

## 2021-03-05 DIAGNOSIS — D6959 Other secondary thrombocytopenia: Secondary | ICD-10-CM | POA: Diagnosis not present

## 2021-03-05 DIAGNOSIS — Z87891 Personal history of nicotine dependence: Secondary | ICD-10-CM

## 2021-03-05 DIAGNOSIS — K219 Gastro-esophageal reflux disease without esophagitis: Secondary | ICD-10-CM | POA: Diagnosis present

## 2021-03-05 DIAGNOSIS — N400 Enlarged prostate without lower urinary tract symptoms: Secondary | ICD-10-CM | POA: Diagnosis present

## 2021-03-05 DIAGNOSIS — Z79899 Other long term (current) drug therapy: Secondary | ICD-10-CM | POA: Diagnosis not present

## 2021-03-05 DIAGNOSIS — I10 Essential (primary) hypertension: Secondary | ICD-10-CM | POA: Diagnosis present

## 2021-03-05 DIAGNOSIS — Z95828 Presence of other vascular implants and grafts: Secondary | ICD-10-CM

## 2021-03-05 DIAGNOSIS — K766 Portal hypertension: Secondary | ICD-10-CM | POA: Diagnosis not present

## 2021-03-05 DIAGNOSIS — I8510 Secondary esophageal varices without bleeding: Secondary | ICD-10-CM | POA: Diagnosis present

## 2021-03-05 DIAGNOSIS — I878 Other specified disorders of veins: Secondary | ICD-10-CM | POA: Diagnosis not present

## 2021-03-05 DIAGNOSIS — K7581 Nonalcoholic steatohepatitis (NASH): Principal | ICD-10-CM | POA: Diagnosis present

## 2021-03-05 DIAGNOSIS — Z833 Family history of diabetes mellitus: Secondary | ICD-10-CM | POA: Diagnosis not present

## 2021-03-05 DIAGNOSIS — Z7984 Long term (current) use of oral hypoglycemic drugs: Secondary | ICD-10-CM

## 2021-03-05 DIAGNOSIS — Z8546 Personal history of malignant neoplasm of prostate: Secondary | ICD-10-CM

## 2021-03-05 DIAGNOSIS — Z823 Family history of stroke: Secondary | ICD-10-CM | POA: Diagnosis not present

## 2021-03-05 DIAGNOSIS — J45909 Unspecified asthma, uncomplicated: Secondary | ICD-10-CM | POA: Diagnosis not present

## 2021-03-05 DIAGNOSIS — I81 Portal vein thrombosis: Secondary | ICD-10-CM | POA: Diagnosis not present

## 2021-03-05 DIAGNOSIS — E538 Deficiency of other specified B group vitamins: Secondary | ICD-10-CM | POA: Diagnosis not present

## 2021-03-05 DIAGNOSIS — Z7901 Long term (current) use of anticoagulants: Secondary | ICD-10-CM

## 2021-03-05 DIAGNOSIS — Z8249 Family history of ischemic heart disease and other diseases of the circulatory system: Secondary | ICD-10-CM

## 2021-03-05 DIAGNOSIS — E119 Type 2 diabetes mellitus without complications: Secondary | ICD-10-CM | POA: Diagnosis present

## 2021-03-05 DIAGNOSIS — Z20822 Contact with and (suspected) exposure to covid-19: Secondary | ICD-10-CM | POA: Diagnosis present

## 2021-03-05 DIAGNOSIS — I8500 Esophageal varices without bleeding: Secondary | ICD-10-CM | POA: Diagnosis not present

## 2021-03-05 DIAGNOSIS — K7682 Hepatic encephalopathy: Secondary | ICD-10-CM | POA: Diagnosis present

## 2021-03-05 DIAGNOSIS — Z923 Personal history of irradiation: Secondary | ICD-10-CM

## 2021-03-05 DIAGNOSIS — K7469 Other cirrhosis of liver: Secondary | ICD-10-CM | POA: Diagnosis not present

## 2021-03-05 DIAGNOSIS — R188 Other ascites: Secondary | ICD-10-CM | POA: Diagnosis not present

## 2021-03-05 DIAGNOSIS — Z9689 Presence of other specified functional implants: Secondary | ICD-10-CM | POA: Diagnosis not present

## 2021-03-05 DIAGNOSIS — D509 Iron deficiency anemia, unspecified: Secondary | ICD-10-CM | POA: Diagnosis not present

## 2021-03-05 HISTORY — PX: IR US GUIDE VASC ACCESS RIGHT: IMG2390

## 2021-03-05 HISTORY — DX: Unspecified atrial fibrillation: I48.91

## 2021-03-05 HISTORY — PX: IR THROMBECT VENO MECH MOD SED: IMG2300

## 2021-03-05 HISTORY — PX: IR EMBO VENOUS NOT HEMORR HEMANG  INC GUIDE ROADMAPPING: IMG5447

## 2021-03-05 HISTORY — PX: IR INTRAVASCULAR ULTRASOUND NON CORONARY: IMG6085

## 2021-03-05 HISTORY — PX: IR TIPS: IMG2295

## 2021-03-05 HISTORY — DX: Personal history of urinary calculi: Z87.442

## 2021-03-05 HISTORY — PX: RADIOLOGY WITH ANESTHESIA: SHX6223

## 2021-03-05 HISTORY — DX: Unspecified asthma, uncomplicated: J45.909

## 2021-03-05 LAB — POCT I-STAT 7, (LYTES, BLD GAS, ICA,H+H)
Acid-base deficit: 2 mmol/L (ref 0.0–2.0)
Bicarbonate: 24 mmol/L (ref 20.0–28.0)
Calcium, Ion: 1.22 mmol/L (ref 1.15–1.40)
HCT: 34 % — ABNORMAL LOW (ref 39.0–52.0)
Hemoglobin: 11.6 g/dL — ABNORMAL LOW (ref 13.0–17.0)
O2 Saturation: 100 %
Patient temperature: 35
Potassium: 4.2 mmol/L (ref 3.5–5.1)
Sodium: 138 mmol/L (ref 135–145)
TCO2: 25 mmol/L (ref 22–32)
pCO2 arterial: 43.4 mmHg (ref 32.0–48.0)
pH, Arterial: 7.341 — ABNORMAL LOW (ref 7.350–7.450)
pO2, Arterial: 252 mmHg — ABNORMAL HIGH (ref 83.0–108.0)

## 2021-03-05 LAB — PROTIME-INR
INR: 1.4 — ABNORMAL HIGH (ref 0.8–1.2)
Prothrombin Time: 17.2 seconds — ABNORMAL HIGH (ref 11.4–15.2)

## 2021-03-05 LAB — GLUCOSE, CAPILLARY
Glucose-Capillary: 173 mg/dL — ABNORMAL HIGH (ref 70–99)
Glucose-Capillary: 158 mg/dL — ABNORMAL HIGH (ref 70–99)
Glucose-Capillary: 195 mg/dL — ABNORMAL HIGH (ref 70–99)
Glucose-Capillary: 303 mg/dL — ABNORMAL HIGH (ref 70–99)

## 2021-03-05 LAB — CBC
HCT: 34.9 % — ABNORMAL LOW (ref 39.0–52.0)
Hemoglobin: 10.9 g/dL — ABNORMAL LOW (ref 13.0–17.0)
MCH: 29.2 pg (ref 26.0–34.0)
MCHC: 31.2 g/dL (ref 30.0–36.0)
MCV: 93.6 fL (ref 80.0–100.0)
Platelets: 136 10*3/uL — ABNORMAL LOW (ref 150–400)
RBC: 3.73 MIL/uL — ABNORMAL LOW (ref 4.22–5.81)
RDW: 15.9 % — ABNORMAL HIGH (ref 11.5–15.5)
WBC: 5.9 10*3/uL (ref 4.0–10.5)
nRBC: 0 % (ref 0.0–0.2)

## 2021-03-05 LAB — COMPREHENSIVE METABOLIC PANEL
ALT: 21 U/L (ref 0–44)
AST: 28 U/L (ref 15–41)
Albumin: 3 g/dL — ABNORMAL LOW (ref 3.5–5.0)
Alkaline Phosphatase: 82 U/L (ref 38–126)
Anion gap: 8 (ref 5–15)
BUN: 26 mg/dL — ABNORMAL HIGH (ref 8–23)
CO2: 23 mmol/L (ref 22–32)
Calcium: 9 mg/dL (ref 8.9–10.3)
Chloride: 104 mmol/L (ref 98–111)
Creatinine, Ser: 1.93 mg/dL — ABNORMAL HIGH (ref 0.61–1.24)
GFR, Estimated: 35 mL/min — ABNORMAL LOW (ref >60)
Glucose, Bld: 166 mg/dL — ABNORMAL HIGH (ref 70–99)
Potassium: 4.2 mmol/L (ref 3.5–5.1)
Sodium: 135 mmol/L (ref 135–145)
Total Bilirubin: 1.2 mg/dL (ref 0.3–1.2)
Total Protein: 7.3 g/dL (ref 6.5–8.1)

## 2021-03-05 LAB — SARS CORONAVIRUS 2 BY RT PCR (HOSPITAL ORDER, PERFORMED IN ~~LOC~~ HOSPITAL LAB): SARS Coronavirus 2: NEGATIVE

## 2021-03-05 LAB — PREPARE RBC (CROSSMATCH)

## 2021-03-05 SURGERY — RADIOLOGY WITH ANESTHESIA
Anesthesia: General

## 2021-03-05 MED ORDER — ROCURONIUM BROMIDE 10 MG/ML (PF) SYRINGE
PREFILLED_SYRINGE | INTRAVENOUS | Status: DC | PRN
  Administered 2021-03-05: 100 mg via INTRAVENOUS
  Administered 2021-03-05: 50 mg via INTRAVENOUS
  Administered 2021-03-05: 30 mg via INTRAVENOUS

## 2021-03-05 MED ORDER — PANTOPRAZOLE SODIUM 40 MG PO TBEC
40.0000 mg | DELAYED_RELEASE_TABLET | Freq: Two times a day (BID) | ORAL | Status: DC
  Administered 2021-03-05 – 2021-03-06 (×2): 40 mg via ORAL
  Filled 2021-03-05 (×2): qty 1

## 2021-03-05 MED ORDER — LIDOCAINE HCL (PF) 1 % IJ SOLN
INTRAMUSCULAR | Status: AC
  Filled 2021-03-05: qty 30

## 2021-03-05 MED ORDER — OXYCODONE HCL 5 MG PO TABS: 5.0000 mg | ORAL_TABLET | Freq: Once | ORAL | Status: DC | PRN

## 2021-03-05 MED ORDER — FENTANYL CITRATE (PF) 250 MCG/5ML IJ SOLN
INTRAMUSCULAR | Status: DC | PRN
  Administered 2021-03-05: 100 ug via INTRAVENOUS

## 2021-03-05 MED ORDER — ALBUMIN HUMAN 5 % IV SOLN: INTRAVENOUS | Status: DC | PRN

## 2021-03-05 MED ORDER — ACETAMINOPHEN 325 MG PO TABS: 650.0000 mg | ORAL_TABLET | Freq: Four times a day (QID) | ORAL | Status: DC | PRN

## 2021-03-05 MED ORDER — ONDANSETRON HCL 4 MG/2ML IJ SOLN
INTRAMUSCULAR | Status: DC | PRN
  Administered 2021-03-05: 4 mg via INTRAVENOUS

## 2021-03-05 MED ORDER — FERROUS SULFATE 325 (65 FE) MG PO TABS
325.0000 mg | ORAL_TABLET | Freq: Every day | ORAL | Status: DC
  Administered 2021-03-06: 325 mg via ORAL
  Filled 2021-03-05: qty 1

## 2021-03-05 MED ORDER — SODIUM CHLORIDE 0.9 % IV SOLN: INTRAVENOUS | Status: DC

## 2021-03-05 MED ORDER — IOHEXOL 300 MG/ML  SOLN: 100.0000 mL | Freq: Once | INTRAMUSCULAR | Status: DC | PRN

## 2021-03-05 MED ORDER — ONDANSETRON HCL 4 MG/2ML IJ SOLN: 4.0000 mg | Freq: Four times a day (QID) | INTRAMUSCULAR | Status: DC | PRN

## 2021-03-05 MED ORDER — AMISULPRIDE (ANTIEMETIC) 5 MG/2ML IV SOLN
10.0000 mg | Freq: Once | INTRAVENOUS | Status: AC | PRN
  Administered 2021-03-05: 10 mg via INTRAVENOUS

## 2021-03-05 MED ORDER — INSULIN ASPART 100 UNIT/ML IJ SOLN
0.0000 [IU] | Freq: Every day | INTRAMUSCULAR | Status: DC
  Administered 2021-03-05: 4 [IU] via SUBCUTANEOUS

## 2021-03-05 MED ORDER — VASOPRESSIN 20 UNIT/ML IV SOLN
INTRAVENOUS | Status: DC | PRN
  Administered 2021-03-05: 2 [IU] via INTRAVENOUS
  Administered 2021-03-05 (×3): 1 [IU] via INTRAVENOUS

## 2021-03-05 MED ORDER — SODIUM CHLORIDE 0.9 % IV SOLN: 10.0000 mL/h | Freq: Once | INTRAVENOUS | Status: DC

## 2021-03-05 MED ORDER — CHLORHEXIDINE GLUCONATE 0.12 % MT SOLN
OROMUCOSAL | Status: AC
  Administered 2021-03-05: 15 mL via OROMUCOSAL
  Filled 2021-03-05: qty 15

## 2021-03-05 MED ORDER — SIMVASTATIN 20 MG PO TABS
40.0000 mg | ORAL_TABLET | Freq: Every day | ORAL | Status: DC
  Administered 2021-03-05: 40 mg via ORAL
  Filled 2021-03-05: qty 2

## 2021-03-05 MED ORDER — OXYCODONE HCL 5 MG PO TABS: 10.0000 mg | ORAL_TABLET | ORAL | Status: DC | PRN

## 2021-03-05 MED ORDER — PHENYLEPHRINE 40 MCG/ML (10ML) SYRINGE FOR IV PUSH (FOR BLOOD PRESSURE SUPPORT)
PREFILLED_SYRINGE | INTRAVENOUS | Status: DC | PRN
  Administered 2021-03-05: 80 ug via INTRAVENOUS
  Administered 2021-03-05 (×2): 160 ug via INTRAVENOUS

## 2021-03-05 MED ORDER — DICYCLOMINE HCL 10 MG PO CAPS: 10.0000 mg | ORAL_CAPSULE | Freq: Two times a day (BID) | ORAL | Status: DC | PRN

## 2021-03-05 MED ORDER — LEVOFLOXACIN IN D5W 500 MG/100ML IV SOLN
500.0000 mg | INTRAVENOUS | Status: AC
  Administered 2021-03-05: 500 mg via INTRAVENOUS
  Filled 2021-03-05: qty 100

## 2021-03-05 MED ORDER — INSULIN ASPART 100 UNIT/ML IJ SOLN
0.0000 [IU] | Freq: Three times a day (TID) | INTRAMUSCULAR | Status: DC
  Administered 2021-03-06: 2 [IU] via SUBCUTANEOUS

## 2021-03-05 MED ORDER — DEXAMETHASONE SODIUM PHOSPHATE 10 MG/ML IJ SOLN
INTRAMUSCULAR | Status: DC | PRN
  Administered 2021-03-05: 5 mg via INTRAVENOUS

## 2021-03-05 MED ORDER — FENTANYL CITRATE (PF) 100 MCG/2ML IJ SOLN
INTRAMUSCULAR | Status: AC
  Filled 2021-03-05: qty 2

## 2021-03-05 MED ORDER — IOHEXOL 300 MG/ML  SOLN
100.0000 mL | Freq: Once | INTRAMUSCULAR | Status: AC | PRN
  Administered 2021-03-05: 95 mL via INTRAVENOUS

## 2021-03-05 MED ORDER — CHLORHEXIDINE GLUCONATE 0.12 % MT SOLN
15.0000 mL | Freq: Once | OROMUCOSAL | Status: AC
  Filled 2021-03-05: qty 15

## 2021-03-05 MED ORDER — EPHEDRINE SULFATE-NACL 50-0.9 MG/10ML-% IV SOSY
PREFILLED_SYRINGE | INTRAVENOUS | Status: DC | PRN
  Administered 2021-03-05: 15 mg via INTRAVENOUS
  Administered 2021-03-05: 10 mg via INTRAVENOUS

## 2021-03-05 MED ORDER — CARVEDILOL 12.5 MG PO TABS
12.5000 mg | ORAL_TABLET | Freq: Two times a day (BID) | ORAL | Status: DC
  Administered 2021-03-05 – 2021-03-06 (×2): 12.5 mg via ORAL
  Filled 2021-03-05 (×2): qty 1

## 2021-03-05 MED ORDER — LACTATED RINGERS IV SOLN: INTRAVENOUS | Status: DC | PRN

## 2021-03-05 MED ORDER — HYDRALAZINE HCL 20 MG/ML IJ SOLN: 10.0000 mg | Freq: Four times a day (QID) | INTRAMUSCULAR | Status: DC | PRN

## 2021-03-05 MED ORDER — FUROSEMIDE 40 MG PO TABS
40.0000 mg | ORAL_TABLET | Freq: Every day | ORAL | Status: DC
  Administered 2021-03-05 – 2021-03-06 (×2): 40 mg via ORAL
  Filled 2021-03-05 (×2): qty 1

## 2021-03-05 MED ORDER — CRANBERRY 500 MG PO TABS: 500.0000 mg | ORAL_TABLET | Freq: Every day | ORAL | Status: DC

## 2021-03-05 MED ORDER — AMISULPRIDE (ANTIEMETIC) 5 MG/2ML IV SOLN
INTRAVENOUS | Status: AC
  Filled 2021-03-05: qty 4

## 2021-03-05 MED ORDER — PHENYLEPHRINE HCL-NACL 20-0.9 MG/250ML-% IV SOLN
INTRAVENOUS | Status: DC | PRN
  Administered 2021-03-05: 40 ug/min via INTRAVENOUS

## 2021-03-05 MED ORDER — SPIRONOLACTONE 25 MG PO TABS
50.0000 mg | ORAL_TABLET | Freq: Every day | ORAL | Status: DC
  Administered 2021-03-05 – 2021-03-06 (×2): 50 mg via ORAL
  Filled 2021-03-05 (×2): qty 2

## 2021-03-05 MED ORDER — PROPOFOL 10 MG/ML IV BOLUS
INTRAVENOUS | Status: DC | PRN
  Administered 2021-03-05: 100 mg via INTRAVENOUS

## 2021-03-05 MED ORDER — FENTANYL CITRATE (PF) 100 MCG/2ML IJ SOLN: 25.0000 ug | INTRAMUSCULAR | Status: DC | PRN

## 2021-03-05 MED ORDER — ONDANSETRON HCL 4 MG/2ML IJ SOLN: 4.0000 mg | Freq: Once | INTRAMUSCULAR | Status: DC | PRN

## 2021-03-05 MED ORDER — MAGNESIUM OXIDE -MG SUPPLEMENT 400 (240 MG) MG PO TABS
400.0000 mg | ORAL_TABLET | Freq: Every day | ORAL | Status: DC
  Administered 2021-03-05 – 2021-03-06 (×2): 400 mg via ORAL
  Filled 2021-03-05 (×4): qty 1

## 2021-03-05 MED ORDER — LEVOTHYROXINE SODIUM 25 MCG PO TABS
137.0000 ug | ORAL_TABLET | Freq: Every day | ORAL | Status: DC
  Administered 2021-03-06: 137 ug via ORAL
  Filled 2021-03-05: qty 1

## 2021-03-05 MED ORDER — SUGAMMADEX SODIUM 200 MG/2ML IV SOLN
INTRAVENOUS | Status: DC | PRN
  Administered 2021-03-05 (×2): 200 mg via INTRAVENOUS

## 2021-03-05 MED ORDER — HEPARIN SODIUM (PORCINE) 1000 UNIT/ML IJ SOLN
INTRAMUSCULAR | Status: AC
  Filled 2021-03-05: qty 10

## 2021-03-05 MED ORDER — OXYCODONE HCL 5 MG PO TABS: 5.0000 mg | ORAL_TABLET | ORAL | Status: DC | PRN

## 2021-03-05 MED ORDER — OXYCODONE HCL 5 MG/5ML PO SOLN: 5.0000 mg | Freq: Once | ORAL | Status: DC | PRN

## 2021-03-05 MED ORDER — LACTULOSE 10 GM/15ML PO SOLN
30.0000 g | Freq: Three times a day (TID) | ORAL | Status: DC
  Administered 2021-03-05 – 2021-03-06 (×2): 30 g via ORAL
  Filled 2021-03-05 (×2): qty 45

## 2021-03-05 NOTE — Telephone Encounter (Signed)
I called and left a message asked that they return call to the office.  

## 2021-03-05 NOTE — Anesthesia Procedure Notes (Signed)
Procedure Name: Intubation Date/Time: 03/05/2021 10:14 AM Performed by: Erick Colace, CRNA Pre-anesthesia Checklist: Patient identified, Emergency Drugs available, Suction available and Patient being monitored Patient Re-evaluated:Patient Re-evaluated prior to induction Oxygen Delivery Method: Circle system utilized Preoxygenation: Pre-oxygenation with 100% oxygen Induction Type: IV induction Ventilation: Mask ventilation without difficulty and Oral airway inserted - appropriate to patient size Laryngoscope Size: Mac and 4 Grade View: Grade I Tube type: Oral Tube size: 7.5 mm Number of attempts: 1 Airway Equipment and Method: Stylet and Oral airway Placement Confirmation: ETT inserted through vocal cords under direct vision, positive ETCO2 and breath sounds checked- equal and bilateral Secured at: 22 cm Tube secured with: Tape Dental Injury: Teeth and Oropharynx as per pre-operative assessment

## 2021-03-05 NOTE — Transfer of Care (Signed)
Immediate Anesthesia Transfer of Care Note  Patient: Dashon Mcintire Prosperi  Procedure(s) Performed: RADIOLOGY WITH ANESTHESIA    I.R. TIPPS  Patient Location: PACU  Anesthesia Type:General  Level of Consciousness: drowsy  Airway & Oxygen Therapy: Patient Spontanous Breathing and Patient connected to face mask oxygen  Post-op Assessment: Report given to RN and Post -op Vital signs reviewed and stable  Post vital signs: Reviewed and stable  Last Vitals:  Vitals Value Taken Time  BP 96/59 03/05/21 1401  Temp    Pulse 98 03/05/21 1402  Resp 17 03/05/21 1402  SpO2 100 % 03/05/21 1402  Vitals shown include unvalidated device data.  Last Pain:  Vitals:   03/05/21 0819  TempSrc:   PainSc: 0-No pain         Complications: No notable events documented.

## 2021-03-05 NOTE — Sedation Documentation (Signed)
Pt transported to PACU bay 5 via stretcher accompanied by RN and CRNA. Danielle RN at bedside to receive pt. Handoff completed. Right neck and right groin site clean dry and intact. Pt drowsy but easily arousable and responds appropriately. No s/s of distress at this time.

## 2021-03-05 NOTE — Anesthesia Procedure Notes (Signed)
Arterial Line Insertion Start/End12/16/2022 9:15 AM, 03/05/2021 9:20 AM Performed by: CRNA  Patient location: Pre-op. Preanesthetic checklist: patient identified, IV checked, site marked, risks and benefits discussed, surgical consent, monitors and equipment checked, pre-op evaluation, timeout performed and anesthesia consent Lidocaine 1% used for infiltration Left, radial was placed Catheter size: 20 G Hand hygiene performed  and maximum sterile barriers used   Attempts: 1 Procedure performed without using ultrasound guided technique. Following insertion, dressing applied. Post procedure assessment: normal and unchanged

## 2021-03-05 NOTE — Procedures (Signed)
Interventional Radiology Procedure Note  Procedure:  1) TIPS creation 2) Portal vein thrombectomy 3) Coil embolization of splenorenal shunt  Findings: Please refer to procedural dictation for full description. 7+2 Viatorr placed from right hepatic vein to right portal vein.  Aspiration thrombectomy of portal vein followed by balloon maceration up to 10 mm.  Coil embolization of splenorenal shunt.    Complications: None immediate  Estimated Blood Loss: 10 mL   Recommendations: Head of bed no greater than 30 degrees for 2 hours post procedure. Please remove Foley once bedrest complete. Lactulose 30 mL TID.   Continue home meds. Resume prior home Eliquis (5 mg BID) given small residual portal thrombus and well-controlled varices, decreased portal hypertension.  Stop aspirin. Advance diet as tolerated. MELD labs in the morning.    Ruthann Cancer, MD Pager: [phone]

## 2021-03-05 NOTE — Anesthesia Postprocedure Evaluation (Signed)
Anesthesia Post Note  Patient: Azarias Chiou Nordin  Procedure(s) Performed: RADIOLOGY WITH ANESTHESIA    I.R. TIPPS     Patient location during evaluation: PACU Anesthesia Type: General Level of consciousness: awake and alert Pain management: pain level controlled Vital Signs Assessment: post-procedure vital signs reviewed and stable Respiratory status: spontaneous breathing, nonlabored ventilation and respiratory function stable Cardiovascular status: blood pressure returned to baseline and stable Postop Assessment: no apparent nausea or vomiting Anesthetic complications: no   No notable events documented.  Last Vitals:  Vitals:   03/05/21 1445 03/05/21 1500  BP: 94/64 (!) 98/55  Pulse: (!) 112 (!) 106  Resp: 13 14  Temp:    SpO2: 93% 92%    Last Pain:  Vitals:   03/05/21 0819  TempSrc:   PainSc: 0-No pain                 Lidia Collum

## 2021-03-05 NOTE — H&P (Signed)
Chief Complaint: Patient was seen in consultation today for non-alcoholic steatohepatitis  Supervising Physician: Ruthann Cancer  Patient Status: [facility] - Out-pt  History of Present Illness: Tyler Farmer is a 80 y.o. male outpatient. History of DM, HTN, HLD, prostate cancer,  a fib, NASH cirrhosis, grade 3 esophageal varices  (banded during EGD on 11.26.22), and portal view thrombus (MRI from 11.25.22). Echo from 1.25.22.  The patient was seen for consultation in the Interventional Radiology Clinic  via Telehealth on 12.9.22 with Dr. Serafina Royals.  The patient was given several different treatment options and the patient decided to pursue TIPS. Patient presents with wife for TIPS, possible portal vein aspiration thrombectomy and splenorenal shunt embolization.   He is NPO, endorses feeling well this morning, though wife says he has been a bit confused.  Denies h/a, dizziness, blurred vision, SOB, chest pain, cough, hematemesis, N/V, or abdominal pain.    Past Medical History:  Diagnosis Date   A-fib ([place])    Arthritis    Hands/Fingers   Asthma    Back pain    Cirrhosis of liver (HCC)    Diabetes mellitus ([place])    Enlarged prostate    Gastric ulcer    GERD (gastroesophageal reflux disease)    H/O bladder problems    History of kidney stones    Hypercholesteremia    Hypertension    Prostate cancer ([place])    Thyroid disease    UGI bleed 10/26/2016   Vitamin B12 deficiency 10/26/2016    Past Surgical History:  Procedure Laterality Date   CIRCUMCISION     at age 48   COLONOSCOPY N/A 11/13/2015   Procedure: COLONOSCOPY;  Surgeon: Rogene Houston, MD;  [facility];  Service: Endoscopy;  Laterality: N/A;  2:10   COLONOSCOPY WITH PROPOFOL N/A 06/17/2020   Procedure: COLONOSCOPY WITH PROPOFOL;  Surgeon: Rogene Houston, MD;  [facility]: [facility];  Service: Endoscopy;  Laterality: N/A;  AM / patient had positive covid 05/25/20   ESOPHAGEAL BANDING N/A 02/13/2021    Procedure: ESOPHAGEAL BANDING;  Surgeon: Harvel Quale, MD;  [facility]: [facility];  Service: Gastroenterology;  Laterality: N/A;   ESOPHAGOGASTRODUODENOSCOPY N/A 06/03/2014   Procedure: ESOPHAGOGASTRODUODENOSCOPY (EGD);  Surgeon: Rogene Houston, MD;  [facility];  Service: Endoscopy;  Laterality: N/A;  730   ESOPHAGOGASTRODUODENOSCOPY N/A 10/26/2016   Procedure: ESOPHAGOGASTRODUODENOSCOPY (EGD);  Surgeon: Rogene Houston, MD;  [facility];  Service: Endoscopy;  Laterality: N/A;   ESOPHAGOGASTRODUODENOSCOPY (EGD) WITH PROPOFOL N/A 06/17/2020   Procedure: ESOPHAGOGASTRODUODENOSCOPY (EGD) WITH PROPOFOL;  Surgeon: Rogene Houston, MD;  [facility];  Service: Endoscopy;  Laterality: N/A;   ESOPHAGOGASTRODUODENOSCOPY (EGD) WITH PROPOFOL N/A 02/13/2021   Procedure: ESOPHAGOGASTRODUODENOSCOPY (EGD) WITH PROPOFOL;  Surgeon: Harvel Quale, MD;  [facility];  Service: Gastroenterology;  Laterality: N/A;   Gastric Ulcer  1993   GIVENS CAPSULE STUDY N/A 10/28/2016   Procedure: GIVENS CAPSULE STUDY;  Surgeon: Rogene Houston, MD;  [facility];  Service: Endoscopy;  Laterality: N/A;   HERNIA REPAIR     IR RADIOLOGIST EVAL & MGMT  02/26/2021   POLYPECTOMY  11/13/2015   Procedure: POLYPECTOMY;  Surgeon: Rogene Houston, MD;  [facility];  Service: Endoscopy;;  colon   POLYPECTOMY  06/17/2020   Procedure: POLYPECTOMY;  Surgeon: Rogene Houston, MD;  [facility];  Service: Endoscopy;;  sigmoid   Right Elbow Right  A pin was put in    Allergies: Penicillins  Medications: Prior to Admission medications   Medication Sig Start Date End Date Taking? Authorizing Provider  acetaminophen (TYLENOL) 500 MG tablet Take 500 mg by mouth every 6 (six) hours as needed.   Yes [provider]  aspirin EC 81 MG tablet Take 1 tablet (81 mg total) by mouth daily with breakfast. Swallow whole. 02/21/21  Yes  Emokpae, Courage, MD  carvedilol (COREG) 12.5 MG tablet Take 1 tablet (12.5 mg total) by mouth 2 (two) times daily. 02/14/21 02/14/22 Yes Shah, Pratik D, DO  Cranberry 500 MG TABS Take 500 mg by mouth daily.   Yes [provider]  dicyclomine (BENTYL) 10 MG capsule Take 10 mg by mouth 2 (two) times daily as needed for spasms. 07/14/20  Yes Rehman, Mechele Dawley, MD  ferrous sulfate 325 (65 FE) MG tablet Take 325 mg by mouth daily with breakfast.   Yes [provider]  furosemide (LASIX) 20 MG tablet Take 1 tablet by mouth once daily Patient taking differently: Take 40 mg by mouth daily. 10/19/20  Yes Branch, Alphonse Guild, MD  glipiZIDE (GLUCOTROL) 10 MG tablet Take 10 mg by mouth 2 (two) times daily before a meal.   Yes [provider]  lactulose (CHRONULAC) 10 GM/15ML solution Take 45 mLs (30 g total) by mouth 2 (two) times daily. 02/21/21  Yes Emokpae, Courage, MD  levothyroxine (SYNTHROID) 150 MCG tablet Take 137 mcg by mouth daily before breakfast.   Yes [provider]  magnesium oxide (MAG-OX) 400 MG tablet Take 400 mg by mouth daily.   Yes [provider]  pantoprazole (PROTONIX) 40 MG tablet Take 1 tablet (40 mg total) by mouth 2 (two) times daily. 02/14/21 03/16/21 Yes Shah, Pratik D, DO  simvastatin (ZOCOR) 40 MG tablet Take 40 mg by mouth at bedtime.    Yes [provider]  spironolactone (ALDACTONE) 50 MG tablet Take 1 tablet (50 mg total) by mouth daily. 05/10/20  Yes Rehman, Mechele Dawley, MD     Family History  Problem Relation Age of Onset   Emphysema Mother    Cerebrovascular Accident Father    Heart disease Father    Diabetes Sister    Heart disease Brother    Heart disease Sister    Heart disease Brother    Diabetes Brother     Social History   Socioeconomic History   Marital status: Married    Spouse name: Not on file   Number of children: Not on file   Years of education: Not on file   Highest education level: Not on file   Occupational History   Not on file  Tobacco Use   Smoking status: Former    Types: Cigarettes    Quit date: 05/25/1989    Years since quitting: 31.8   Smokeless tobacco: Former    Quit date: 01/16/1990  Vaping Use   Vaping Use: Never used  Substance and Sexual Activity   Alcohol use: No    Alcohol/week: 0.0 standard drinks    Comment: Drank many years - 30 years ago   Drug use: No   Sexual activity: Not on file  Other Topics Concern   Not on file  Social History Narrative   Not on file   Social Determinants of Health   Financial Resource Strain: Not on file  Food Insecurity: Not on file  Transportation Needs: Not on file  Physical Activity: Not on file  Stress: Not  on file  Social Connections: Not on file    Review of Systems: A 12 point ROS discussed and pertinent positives are indicated in the HPI above.  All other systems are negative.  Vital Signs: BP 119/68    Pulse 71    Temp 97.6 F (36.4 C) (Oral)    Resp 18    Ht 5\' 9"  (1.753 m)    Wt 185 lb 10 oz (84.2 kg)    SpO2 100%    BMI 27.41 kg/m   Physical Exam Constitutional:      General: He is not in acute distress. HENT:     Head: Normocephalic.     Mouth/Throat:     Pharynx: Oropharynx is clear.  Eyes:     Extraocular Movements: Extraocular movements intact.  Cardiovascular:     Rate and Rhythm: Normal rate and regular rhythm.     Pulses: Normal pulses.     Heart sounds: Normal heart sounds.  Pulmonary:     Effort: Pulmonary effort is normal.     Breath sounds: Normal breath sounds.  Abdominal:     Palpations: Abdomen is soft.  Skin:    General: Skin is dry.     Capillary Refill: Capillary refill takes 2 to 3 seconds.  Neurological:     General: No focal deficit present.     Mental Status: He is alert.  Psychiatric:        Mood and Affect: Mood normal.        Behavior: Behavior normal.    Imaging: CT Head Wo Contrast  Result Date: 02/19/2021 CLINICAL DATA:  Poly trauma with head and  cervical spine injury suspected. EXAM: CT HEAD WITHOUT CONTRAST CT CERVICAL SPINE WITHOUT CONTRAST TECHNIQUE: Multidetector CT imaging of the head and cervical spine was performed following the standard protocol without intravenous contrast. Multiplanar CT image reconstructions of the cervical spine were also generated. COMPARISON:  None. FINDINGS: CT HEAD FINDINGS Brain: There is mild-to-moderate atrophy, small vessel disease and atrophic ventriculomegaly, without midline shift. There is no asymmetry concerning for acute infarct, hemorrhage or mass, or old territorial infarct. Vascular: There are calcifications in the siphons and distal left vertebral artery but no hyperdense central vessels. Skull: Normal. Negative for fracture or focal lesion. Sinuses/Orbits: Unremarkable orbital contents. S shaped nasal septum with right-sided spurring. There is mild membrane disease in the ethmoid and right maxillary sinuses without fluid levels or other significant sinus findings. There is mild patchy fluid in the lower right mastoid air cells. Left mastoids are clear. Other: None. CT CERVICAL SPINE FINDINGS Alignment: There is a minimal grade 1 anterolisthesis at C7-T1 most likely due to facet hypertrophy. No traumatic or further listhesis is seen. Skull base and vertebrae: There is osteopenia without evidence of fractures. There is cervical spondylosis. Marginal osteophytes are most prominent at C5-6 and C6-7, with only minimal marginal osteophytes at the remaining levels. There is narrowing and osteophytosis of the anterior atlantodental joint . Soft tissues and spinal canal: No prevertebral fluid or swelling. No visible canal hematoma.There are patchy calcifications at the carotid bifurcations. Disc levels: Degenerative disc collapse is seen at C5-6 and C6-7, with posterior disc osteophyte complex causing mild ventral thecal sac encroachment at these levels without cord compression. Other levels do not show significant  disc space loss or soft tissue or bony encroachment on the thecal sac. Upper chest: There are emphysematous and scarring changes in the lung apices. Atrophic thyroid. Other: None. IMPRESSION: 1. no acute intracranial CT findings or  depressed skull fractures. 2. Atrophy and small vessel disease. 3. Sinus membrane disease and fluid in the lower right mastoid air cells. 4. Cervical osteopenia and degenerative changes without evidence of fractures. Electronically Signed   By: Telford Nab M.D.   On: 02/19/2021 21:36   CT Cervical Spine Wo Contrast  Result Date: 02/19/2021 CLINICAL DATA:  Poly trauma with head and cervical spine injury suspected. EXAM: CT HEAD WITHOUT CONTRAST CT CERVICAL SPINE WITHOUT CONTRAST TECHNIQUE: Multidetector CT imaging of the head and cervical spine was performed following the standard protocol without intravenous contrast. Multiplanar CT image reconstructions of the cervical spine were also generated. COMPARISON:  None. FINDINGS: CT HEAD FINDINGS Brain: There is mild-to-moderate atrophy, small vessel disease and atrophic ventriculomegaly, without midline shift. There is no asymmetry concerning for acute infarct, hemorrhage or mass, or old territorial infarct. Vascular: There are calcifications in the siphons and distal left vertebral artery but no hyperdense central vessels. Skull: Normal. Negative for fracture or focal lesion. Sinuses/Orbits: Unremarkable orbital contents. S shaped nasal septum with right-sided spurring. There is mild membrane disease in the ethmoid and right maxillary sinuses without fluid levels or other significant sinus findings. There is mild patchy fluid in the lower right mastoid air cells. Left mastoids are clear. Other: None. CT CERVICAL SPINE FINDINGS Alignment: There is a minimal grade 1 anterolisthesis at C7-T1 most likely due to facet hypertrophy. No traumatic or further listhesis is seen. Skull base and vertebrae: There is osteopenia without evidence of  fractures. There is cervical spondylosis. Marginal osteophytes are most prominent at C5-6 and C6-7, with only minimal marginal osteophytes at the remaining levels. There is narrowing and osteophytosis of the anterior atlantodental joint . Soft tissues and spinal canal: No prevertebral fluid or swelling. No visible canal hematoma.There are patchy calcifications at the carotid bifurcations. Disc levels: Degenerative disc collapse is seen at C5-6 and C6-7, with posterior disc osteophyte complex causing mild ventral thecal sac encroachment at these levels without cord compression. Other levels do not show significant disc space loss or soft tissue or bony encroachment on the thecal sac. Upper chest: There are emphysematous and scarring changes in the lung apices. Atrophic thyroid. Other: None. IMPRESSION: 1. no acute intracranial CT findings or depressed skull fractures. 2. Atrophy and small vessel disease. 3. Sinus membrane disease and fluid in the lower right mastoid air cells. 4. Cervical osteopenia and degenerative changes without evidence of fractures. Electronically Signed   By: Telford Nab M.D.   On: 02/19/2021 21:36   MR 3D Recon At Scanner  Result Date: 02/12/2021 CLINICAL DATA:  Right upper quadrant abdominal pain. Recent abnormal abdominal CT and ultrasound with indeterminate gallbladder wall thickening. EXAM: MRI ABDOMEN WITHOUT AND WITH CONTRAST (INCLUDING MRCP) TECHNIQUE: Multiplanar multisequence MR imaging of the abdomen was performed both before and after the administration of intravenous contrast. Dynamic post-contrast sequences were inadvertently obtained in the coronal plane. Heavily T2-weighted images of the biliary and pancreatic ducts were obtained, and three-dimensional MRCP images were rendered by post processing. CONTRAST:  61mL GADAVIST GADOBUTROL 1 MMOL/ML IV SOLN COMPARISON:  01/27/2021 right upper quadrant abdominal sonogram and CT abdomen/pelvis. 06/02/2014 MRI abdomen. FINDINGS:  Motion degraded scan, limiting assessment. Lower chest: No acute abnormality at the lung bases. Hepatobiliary: Diffusely mildly irregular liver surface, compatible with cirrhosis. No hepatic steatosis. No liver mass. No gallstones. Gallbladder is nondistended. Mild diffuse gallbladder wall thickening with the suggestion of mild pericholecystic varices along the gallbladder wall adjacent to the liver, unchanged from recent CT.  No biliary ductal dilatation. Common bile duct diameter 5-6 mm. No choledocholithiasis. No biliary masses, strictures or beading. Pancreas: Generalized mild pancreatic parenchymal and peripancreatic edema, most prominent in the pancreatic head and neck region. No peripancreatic fluid collections. Preserved pancreatic parenchymal enhancement. No pancreatic mass or duct dilation. No pancreas divisum. Spleen: Mild splenomegaly. Craniocaudal splenic length 14.3 cm. No splenic masses. Adrenals/Urinary Tract: No discrete adrenal nodules. No hydronephrosis. Numerous simple bilateral renal cortical cysts, largest 6.2 cm in the posterior lower left kidney. No suspicious renal masses. Stomach/Bowel: Small hiatal hernia. Stable postsurgical changes from distal gastrectomy with gastrojejunostomy. No acute gastric abnormality. Visualized small and large bowel is normal caliber, with no bowel wall thickening. Vascular/Lymphatic: Atherosclerotic nonaneurysmal abdominal aorta. Acute near occlusive thrombosis of the main portal vein extending into the proximal splenic vein, portosplenic venous confluence and adjacent SMV. Retroaortic left renal vein. Patent renal and hepatic veins. Ill-defined high left para-aortic lymphadenopathy measuring up to 2.5 cm short axis diameter (series 25/image 44), not appreciably changed since 05/08/2020 CT abdomen study. Mild aortocaval adenopathy up to 1.2 cm (series 25/image 72), stable since 05/08/2020 CT. Other: No abdominal ascites or focal fluid collection. Musculoskeletal:  No aggressive appearing focal osseous lesions. IMPRESSION: 1. Acute near occlusive thrombosis of the main portal vein extending into the proximal splenic vein, portosplenic venous confluence and adjacent SMV. 2. Generalized mild pancreatic and peripancreatic edema, most prominent in the pancreatic head and neck region, nonspecific, probably secondary to the acute portal vein thrombosis, although acute non-necrotizing pancreatitis is on the differential. No peripancreatic fluid collections. Suggest correlation with serum lipase. 3. Cirrhosis. No liver masses. 4. Mild diffuse gallbladder wall thickening is unchanged and favored to be due to pericholecystic varices. 5. Mild splenomegaly. No ascites. 6. Small hiatal hernia. These results will be called to the ordering clinician or representative by the Radiologist Assistant, and communication documented in the PACS or [employer]. Electronically Signed   By: Ilona Sorrel M.D.   On: 02/12/2021 11:50   DG CHEST PORT 1 VIEW  Result Date: 02/20/2021 CLINICAL DATA:  Altered mental status EXAM: PORTABLE CHEST 1 VIEW COMPARISON:  01/27/2021 FINDINGS: Persistent interstitial prominence similar to the prior study. No new consolidation. No pleural effusion. Stable cardiomediastinal contours. IMPRESSION: Stable nonspecific interstitial changes. Electronically Signed   By: Macy Mis M.D.   On: 02/20/2021 10:18   MR ABDOMEN MRCP W WO CONTAST  Result Date: 02/12/2021 CLINICAL DATA:  Right upper quadrant abdominal pain. Recent abnormal abdominal CT and ultrasound with indeterminate gallbladder wall thickening. EXAM: MRI ABDOMEN WITHOUT AND WITH CONTRAST (INCLUDING MRCP) TECHNIQUE: Multiplanar multisequence MR imaging of the abdomen was performed both before and after the administration of intravenous contrast. Dynamic post-contrast sequences were inadvertently obtained in the coronal plane. Heavily T2-weighted images of the biliary and pancreatic ducts were  obtained, and three-dimensional MRCP images were rendered by post processing. CONTRAST:  35mL GADAVIST GADOBUTROL 1 MMOL/ML IV SOLN COMPARISON:  01/27/2021 right upper quadrant abdominal sonogram and CT abdomen/pelvis. 06/02/2014 MRI abdomen. FINDINGS: Motion degraded scan, limiting assessment. Lower chest: No acute abnormality at the lung bases. Hepatobiliary: Diffusely mildly irregular liver surface, compatible with cirrhosis. No hepatic steatosis. No liver mass. No gallstones. Gallbladder is nondistended. Mild diffuse gallbladder wall thickening with the suggestion of mild pericholecystic varices along the gallbladder wall adjacent to the liver, unchanged from recent CT. No biliary ductal dilatation. Common bile duct diameter 5-6 mm. No choledocholithiasis. No biliary masses, strictures or beading. Pancreas: Generalized mild pancreatic parenchymal and peripancreatic  edema, most prominent in the pancreatic head and neck region. No peripancreatic fluid collections. Preserved pancreatic parenchymal enhancement. No pancreatic mass or duct dilation. No pancreas divisum. Spleen: Mild splenomegaly. Craniocaudal splenic length 14.3 cm. No splenic masses. Adrenals/Urinary Tract: No discrete adrenal nodules. No hydronephrosis. Numerous simple bilateral renal cortical cysts, largest 6.2 cm in the posterior lower left kidney. No suspicious renal masses. Stomach/Bowel: Small hiatal hernia. Stable postsurgical changes from distal gastrectomy with gastrojejunostomy. No acute gastric abnormality. Visualized small and large bowel is normal caliber, with no bowel wall thickening. Vascular/Lymphatic: Atherosclerotic nonaneurysmal abdominal aorta. Acute near occlusive thrombosis of the main portal vein extending into the proximal splenic vein, portosplenic venous confluence and adjacent SMV. Retroaortic left renal vein. Patent renal and hepatic veins. Ill-defined high left para-aortic lymphadenopathy measuring up to 2.5 cm short axis  diameter (series 25/image 44), not appreciably changed since 05/08/2020 CT abdomen study. Mild aortocaval adenopathy up to 1.2 cm (series 25/image 72), stable since 05/08/2020 CT. Other: No abdominal ascites or focal fluid collection. Musculoskeletal: No aggressive appearing focal osseous lesions. IMPRESSION: 1. Acute near occlusive thrombosis of the main portal vein extending into the proximal splenic vein, portosplenic venous confluence and adjacent SMV. 2. Generalized mild pancreatic and peripancreatic edema, most prominent in the pancreatic head and neck region, nonspecific, probably secondary to the acute portal vein thrombosis, although acute non-necrotizing pancreatitis is on the differential. No peripancreatic fluid collections. Suggest correlation with serum lipase. 3. Cirrhosis. No liver masses. 4. Mild diffuse gallbladder wall thickening is unchanged and favored to be due to pericholecystic varices. 5. Mild splenomegaly. No ascites. 6. Small hiatal hernia. These results will be called to the ordering clinician or representative by the Radiologist Assistant, and communication documented in the PACS or [employer]. Electronically Signed   By: Ilona Sorrel M.D.   On: 02/12/2021 11:50   IR Radiologist Eval & Mgmt  Result Date: 02/26/2021 Please refer to notes tab for details about interventional procedure. (Op Note)  CT Angio Abd/Pel W and/or Wo Contrast  Result Date: 02/12/2021 CLINICAL DATA:  Acute mesenteric ischemia, diabetes, cirrhosis, hypertension EXAM: CTA ABDOMEN AND PELVIS WITH CONTRAST TECHNIQUE: Multidetector CT imaging of the abdomen and pelvis was performed using the standard protocol during bolus administration of intravenous contrast. Multiplanar reconstructed images and MIPs were obtained and reviewed to evaluate the vascular anatomy. CONTRAST:  37mL OMNIPAQUE IOHEXOL 350 MG/ML SOLN COMPARISON:  MR from earlier the same day, CT 05/08/2020 FINDINGS: VASCULAR Coronary  calcifications. Aorta: Moderate calcified plaque. No aneurysm, dissection, or stenosis. Celiac: Calcified ostial plaque with only mild stenosis, patent distally. SMA: Calcified ostial plaque extending over length of 2 cm resulting in mild stenosis, patent distally. Replaced hepatic arterial supply, an anatomic variant. Renals: Single left with calcified ostial plaque extending over length of at least 1.9 cm resulting in at least mild stenosis, patent distally. Single right, with calcified ostial plaque resulting in short segment stenosis of at least mild severity, patent distally. IMA: Patent without evidence of aneurysm, dissection, vasculitis or significant stenosis. Inflow: Scattered calcified plaque. No aneurysm, dissection, or stenosis. Proximal Outflow: Atheromatous, patent. Veins: Partially occlusive intraluminal thrombus at the portosplenic confluence extending through the length of the main portal vein and into the left portal vein, as was described on earlier MR of the same day, and new since CT 01/27/2021. The splenic vein is patent. Inferior mesenteric vein is patent. Tributaries of the SMV appear patent. Hepatic veins are patent. Moderate esophageal varices. Chronic large left retroperitoneal splenorenal venous  shunt. Retroaortic left renal vein, an anatomic variant. Right renal vein patent. Iliac venous system and IVC unremarkable. Review of the MIP images confirms the above findings. NON-VASCULAR Lower chest: No pleural or pericardial effusion. Visualized lung bases clear. Hepatobiliary: Nodular hepatic contour. No focal liver lesion. Symmetric parenchymal enhancement. Subcentimeter hyperdensities in the dependent aspect of the nondistended gallbladder possibly small partially calcified gallstones. Pancreas: Unremarkable. No pancreatic ductal dilatation or surrounding inflammatory changes. Spleen: Splenomegaly, 15.1 cm in length.  No focal lesion. Adrenals/Urinary Tract: Adrenal glands unremarkable.  Bilateral renal cysts, largest 6.5 from the mid left kidney. No urolithiasis or hydronephrosis. Urinary bladder is physiologically distended. Stomach/Bowel: Esophageal varices. No significant gastric varices. Gastrojejunostomy and apparent distal partial gastrectomy. Small bowel and colon are nondilated. Lymphatic: Increased number of borderline enlarged left para-aortic and aortocaval lymph nodes as before. Borderline central mesenteric adenopathy. No pelvic adenopathy. Reproductive: Prostate is unremarkable. Other: Persistent inflammatory/edematous changes in the retroperitoneum posterior to the pancreas. No ascites. No free air. Musculoskeletal: Advanced facet DJD L4-5, allowing grade 1 anterolisthesis. Negative for fracture or worrisome bone lesion. IMPRESSION: 1. Partially occlusive acute/subacute venous thrombus extending from the portosplenic confluence through the length of the main portal vein and into the left portal vein, as seen on earlier MRI, and new since earlier study of 01/27/2021. 2. No new bowel or mesenteric changes to suggest ischemia. 3. Chronic large splenorenal shunt and smaller esophageal varices provide collateral decompression of the mesenteric venous system. 4. Morphologic changes of cirrhosis. 5. Chronic central mesenteric and retroperitoneal adenopathy, nonspecific. 6. Coronary and Aortic Atherosclerosis (ICD10-170.0). Electronically Signed   By: Lucrezia Europe M.D.   On: 02/12/2021 16:43    Labs:  CBC: Recent Labs    02/19/21 1508 02/20/21 0348 02/21/21 0503 03/05/21 0752  WBC 2.7* 3.9* 2.6* 5.9  HGB 9.6* 9.0* 9.1* 10.9*  HCT 30.7* 28.6* 29.2* 34.9*  PLT 73* 77* 63* 136*    COAGS: Recent Labs    02/19/21 1508 02/21/21 0503 02/24/21 0956 03/05/21 0752  INR 1.4* 1.6* 1.3* 1.4*    BMP: Recent Labs    02/19/21 1508 02/20/21 0348 02/21/21 0503 02/24/21 0956 03/05/21 0752  NA 137 139 137   138 137 135  K 4.7 4.2 3.6   3.6 4.6 4.2  CL 106 112* 110   111 106  104  CO2 22 19* 20*   20* 18* 23  GLUCOSE 129* 124* 105*   107* 176* 166*  BUN 33* 30* 24*   23 25 26*  CALCIUM 8.6* 8.5* 8.5*   8.4* 8.2* 9.0  CREATININE 2.26* 1.97* 1.63*   1.61* 1.85* 1.93*  GFRNONAA 29* 34* 42*   43*  --  35*    LIVER FUNCTION TESTS: Recent Labs    02/13/21 0442 02/19/21 1508 02/21/21 0503 02/24/21 0956 03/05/21 0752  BILITOT 0.7 1.2 1.8* 1.3* 1.2  AST 25 26 22 15 28   ALT 17 19 17 12 21   ALKPHOS 92 94 85  --  82  PROT 6.7 7.8 7.0 7.1 7.3  ALBUMIN 2.6* 3.2* 2.9*   2.9*  --  3.0*    TUMOR MARKERS: Recent Labs    05/05/20 1043 01/28/21 1441  AFPTM 1.0 0.6    Assessment and Plan:  80 year old male with history of Childs Pugh B, MELD 19, NASH cirrhosis and portal hypertension complicated by esophageal varices, splenorenal shunt, and acute, non-occlusive portal vein thrombus.    He is no longer on anticoagulation but taking aspirin.  He  is NPO.  He and wife are in agreement to proceed.    Labs, imaging, history, and chart reviewed.  OK to proceed with planned TIPS with possible portal vein aspiration thrombectomy and splenorenal shunt embolization with expected overnight observation.   Risks and benefits of TIPS, BRTO and/or additional variceal embolization were discussed with the patient and/or the patient's family including, but not limited to, infection, bleeding, damage to adjacent structures, worsening hepatic and/or cardiac function, worsening and/or the development of altered mental status/encephalopathy, non-target embolization and death.   This interventional procedure involves the use of X-rays and because of the nature of the planned procedure, it is possible that we will have prolonged use of X-ray fluoroscopy.  Potential radiation risks to you include (but are not limited to) the following: - A slightly elevated risk for cancer  several years later in life. This risk is typically less than 0.5% percent. This risk is low in comparison to the  normal incidence of human cancer, which is 33% for women and 50% for men according to the [employer]. - Radiation induced injury can include skin redness, resembling a rash, tissue breakdown / ulcers and hair loss (which can be temporary or permanent).   The likelihood of either of these occurring depends on the difficulty of the procedure and whether you are sensitive to radiation due to previous procedures, disease, or genetic conditions.   IF your procedure requires a prolonged use of radiation, you will be notified and given written instructions for further action.  It is your responsibility to monitor the irradiated area for the 2 weeks following the procedure and to notify your physician if you are concerned that you have suffered a radiation induced injury.    All of the patient's questions were answered, patient is agreeable to proceed.  Consent signed and in chart.    Thank you for this interesting consult.  I greatly enjoyed meeting Tyler Farmer and look forward to participating in their care.  A copy of this report was sent to the requesting provider on this date.  Electronically Signed: Pasty Spillers, PA 03/05/2021, 9:07 AM   I spent a total of 25 Minutes in face to face in clinical consultation, greater than 50% of which was counseling/coordinating care for TIPS

## 2021-03-06 DIAGNOSIS — K766 Portal hypertension: Secondary | ICD-10-CM | POA: Diagnosis not present

## 2021-03-06 DIAGNOSIS — K7581 Nonalcoholic steatohepatitis (NASH): Secondary | ICD-10-CM | POA: Diagnosis not present

## 2021-03-06 DIAGNOSIS — Z9689 Presence of other specified functional implants: Secondary | ICD-10-CM | POA: Diagnosis not present

## 2021-03-06 DIAGNOSIS — I81 Portal vein thrombosis: Secondary | ICD-10-CM | POA: Diagnosis not present

## 2021-03-06 LAB — COMPREHENSIVE METABOLIC PANEL
ALT: 40 U/L (ref 0–44)
AST: 66 U/L — ABNORMAL HIGH (ref 15–41)
Albumin: 2.9 g/dL — ABNORMAL LOW (ref 3.5–5.0)
Alkaline Phosphatase: 95 U/L (ref 38–126)
Anion gap: 10 (ref 5–15)
BUN: 26 mg/dL — ABNORMAL HIGH (ref 8–23)
CO2: 21 mmol/L — ABNORMAL LOW (ref 22–32)
Calcium: 9 mg/dL (ref 8.9–10.3)
Chloride: 102 mmol/L (ref 98–111)
Creatinine, Ser: 1.84 mg/dL — ABNORMAL HIGH (ref 0.61–1.24)
GFR, Estimated: 37 mL/min — ABNORMAL LOW (ref >60)
Glucose, Bld: 251 mg/dL — ABNORMAL HIGH (ref 70–99)
Potassium: 4.3 mmol/L (ref 3.5–5.1)
Sodium: 133 mmol/L — ABNORMAL LOW (ref 135–145)
Total Bilirubin: 1.9 mg/dL — ABNORMAL HIGH (ref 0.3–1.2)
Total Protein: 6.8 g/dL (ref 6.5–8.1)

## 2021-03-06 LAB — CBC
HCT: 33.9 % — ABNORMAL LOW (ref 39.0–52.0)
Hemoglobin: 10.6 g/dL — ABNORMAL LOW (ref 13.0–17.0)
MCH: 29.4 pg (ref 26.0–34.0)
MCHC: 31.3 g/dL (ref 30.0–36.0)
MCV: 93.9 fL (ref 80.0–100.0)
Platelets: 107 10*3/uL — ABNORMAL LOW (ref 150–400)
RBC: 3.61 MIL/uL — ABNORMAL LOW (ref 4.22–5.81)
RDW: 16.3 % — ABNORMAL HIGH (ref 11.5–15.5)
WBC: 7.6 10*3/uL (ref 4.0–10.5)
nRBC: 0 % (ref 0.0–0.2)

## 2021-03-06 LAB — PROTIME-INR
INR: 1.6 — ABNORMAL HIGH (ref 0.8–1.2)
Prothrombin Time: 19.1 seconds — ABNORMAL HIGH (ref 11.4–15.2)

## 2021-03-06 LAB — GLUCOSE, CAPILLARY: Glucose-Capillary: 127 mg/dL — ABNORMAL HIGH (ref 70–99)

## 2021-03-06 MED ORDER — ASPIRIN EC 81 MG PO TBEC: 81.0000 mg | DELAYED_RELEASE_TABLET | Freq: Every day | ORAL | Status: DC

## 2021-03-06 NOTE — Discharge Summary (Signed)
Patient ID: Tyler Farmer MRN: [id number] DOB/AGE: 08/29/1940 80 y.o.  Admit date: 03/05/2021 Discharge date: 03/06/2021  Supervising Physician: Sandi Mariscal  Patient Status: [facility] - In-pt  Admission Diagnoses:  NASH cirrhosis Secondary portal hypertension resulting in esophageal varices and splenorenal shunt.    Discharge Diagnoses:  Principal Problem:   S/P TIPS (transjugular intrahepatic portosystemic shunt)   Discharged Condition: good  Hospital Course:  Tyler Farmer is a 80 y.o. male outpatient. History of DM, HTN, HLD, prostate cancer,  a fib, NASH cirrhosis, grade 3 esophageal varices  (banded during EGD on 11.26.22), and portal vein thrombus (MRI from 11.25.22).    He was seen by Dr. Serafina Royals on 02/26/21 for evaluation for TIPS, possible portal vein aspiration thrombectomy and splenorenal shunt embolization.   The procedure was done yesterday.  Mr. Sortino has done well overnight.  He is tolerating a diet. He has ambulated. He is ready for D/C home.  Consults: None  Significant Diagnostic Studies:  Lab Results  Component Value Date   WBC 7.6 03/06/2021   HGB 10.6 (L) 03/06/2021   HCT 33.9 (L) 03/06/2021   MCV 93.9 03/06/2021   PLT 107 (L) 03/06/2021   Lab Results  Component Value Date   INR 1.6 (H) 03/06/2021   INR 1.4 (H) 03/05/2021   INR 1.3 (H) 02/24/2021   Lab Results  Component Value Date   ALT 40 03/06/2021   AST 66 (H) 03/06/2021   ALKPHOS 95 03/06/2021   BILITOT 1.9 (H) 03/06/2021   Lab Results  Component Value Date   CREATININE 1.84 (H) 03/06/2021   BUN 26 (H) 03/06/2021   NA 133 (L) 03/06/2021   K 4.3 03/06/2021   CL 102 03/06/2021   CO2 21 (L) 03/06/2021     Treatments:  1) TIPS creation 2) Portal vein thrombectomy 3) Coil embolization of splenorenal shunt    Discharge Exam: Blood pressure 100/62, pulse 80, temperature 97.8 F (36.6 C), temperature source Oral, resp. rate 19, height 5\' 9"  (1.753 m), weight 84.2  kg, SpO2 97 %. Awake and alert NAD Lungs clear No respiratory distress Heart RRR Access sites ok, no bleeding   Disposition: Discharge disposition: 01-Home or [facility] will call patient with follow up appointment instructions.  Discharge Instructions     Call MD for:  persistant nausea and vomiting   Complete by: As directed    Call MD for:  redness, tenderness, or signs of infection (pain, swelling, redness, odor or green/yellow discharge around incision site)   Complete by: As directed    Call MD for:  severe uncontrolled pain   Complete by: As directed    Call MD for:  temperature >100.4   Complete by: As directed    Diet general   Complete by: As directed    Increase activity slowly   Complete by: As directed    No wound care   Complete by: As directed       Allergies as of 03/06/2021       Reactions   Penicillins Nausea And Vomiting   Has patient had a PCN reaction causing immediate rash, facial/tongue/throat swelling, SOB or lightheadedness with hypotension: Yes Has patient had a PCN reaction causing severe rash involving mucus membranes or skin necrosis: No Has patient had a PCN reaction that required hospitalization: No Has patient had a PCN reaction occurring within the last 10 years: No If all of the above answers are "NO",  then may proceed with Cephalosporin use. Patient states that it also caused pain in his sides.        Medication List     STOP taking these medications    carvedilol 12.5 MG tablet Commonly known as: Coreg       TAKE these medications    acetaminophen 500 MG tablet Commonly known as: TYLENOL Take 500 mg by mouth every 6 (six) hours as needed.   aspirin EC 81 MG tablet Take 1 tablet (81 mg total) by mouth daily with breakfast. Swallow whole.   Cranberry 500 MG Tabs Take 500 mg by mouth daily.   dicyclomine 10 MG capsule Commonly known as: BENTYL Take 10 mg by mouth 2 (two) times daily as needed  for spasms.   ferrous sulfate 325 (65 FE) MG tablet Take 325 mg by mouth daily with breakfast.   furosemide 20 MG tablet Commonly known as: LASIX Take 1 tablet by mouth once daily What changed: how much to take   glipiZIDE 10 MG tablet Commonly known as: GLUCOTROL Take 10 mg by mouth 2 (two) times daily before a meal.   lactulose 10 GM/15ML solution Commonly known as: CHRONULAC Take 45 mLs (30 g total) by mouth 2 (two) times daily.   levothyroxine 150 MCG tablet Commonly known as: SYNTHROID Take 137 mcg by mouth daily before breakfast.   magnesium oxide 400 MG tablet Commonly known as: MAG-OX Take 400 mg by mouth daily.   pantoprazole 40 MG tablet Commonly known as: PROTONIX Take 1 tablet (40 mg total) by mouth 2 (two) times daily.   simvastatin 40 MG tablet Commonly known as: ZOCOR Take 40 mg by mouth at bedtime.   spironolactone 50 MG tablet Commonly known as: ALDACTONE Take 1 tablet (50 mg total) by mouth daily.          Electronically Signed: Murrell Redden, PA-C 03/06/2021, 10:19 AM     I have spent Less Than 30 Minutes discharging [place].

## 2021-03-07 NOTE — Progress Notes (Signed)
Discharge instructions reviewed and given to pt/wife/daughter, pt/family acknowledge understanding

## 2021-03-08 ENCOUNTER — Ambulatory Visit (INDEPENDENT_AMBULATORY_CARE_PROVIDER_SITE_OTHER): Payer: Medicare Other | Admitting: Gastroenterology

## 2021-03-08 ENCOUNTER — Telehealth (INDEPENDENT_AMBULATORY_CARE_PROVIDER_SITE_OTHER): Payer: Self-pay

## 2021-03-08 ENCOUNTER — Encounter (INDEPENDENT_AMBULATORY_CARE_PROVIDER_SITE_OTHER): Payer: Self-pay | Admitting: Gastroenterology

## 2021-03-08 ENCOUNTER — Other Ambulatory Visit: Payer: Self-pay

## 2021-03-08 VITALS — BP 99/65 | HR 120 | Temp 97.3°F | Ht 69.0 in | Wt 175.4 lb

## 2021-03-08 DIAGNOSIS — I81 Portal vein thrombosis: Secondary | ICD-10-CM

## 2021-03-08 DIAGNOSIS — Z95828 Presence of other vascular implants and grafts: Secondary | ICD-10-CM | POA: Diagnosis not present

## 2021-03-08 DIAGNOSIS — K7682 Hepatic encephalopathy: Secondary | ICD-10-CM

## 2021-03-08 DIAGNOSIS — K7460 Unspecified cirrhosis of liver: Secondary | ICD-10-CM | POA: Diagnosis not present

## 2021-03-08 DIAGNOSIS — R188 Other ascites: Secondary | ICD-10-CM | POA: Diagnosis not present

## 2021-03-08 DIAGNOSIS — K259 Gastric ulcer, unspecified as acute or chronic, without hemorrhage or perforation: Secondary | ICD-10-CM | POA: Diagnosis not present

## 2021-03-08 LAB — BPAM RBC
Blood Product Expiration Date: 202301022359
Blood Product Expiration Date: 202301062359
ISSUE DATE / TIME: 202212091140
Unit Type and Rh: 5100
Unit Type and Rh: 5100

## 2021-03-08 LAB — TYPE AND SCREEN
ABO/RH(D): O POS
Antibody Screen: NEGATIVE
Unit division: 0
Unit division: 0

## 2021-03-08 NOTE — H&P (View-Only) (Signed)
[name], M.D. Gastroenterology & Hepatology [facility] [address] [place], [place] [address]  Primary Care Physician: [name], MD [place] [address]  I will communicate my assessment and recommendations to the referring MD via EMR.  Problems: NASH cirrhosis Esophageal varices Ascites History of gastric AV malformation status post APC ablation Acute portal vein thrombosis S/p TIPS, partial thrombectomy and coil embolization of splenorenal shunt Gastric ulcer  History of Present Illness: Quantae Martel Magill is a 80 y.o. male with past medical history of [name] cirrhosis complicated by nonbleeding grade 2 esophageal varices status post banding, ascites, history of bleeding gastric AVM, peptic ulcer disease status post Billroth II gastrectomy, atrial fibrillation, diabetes, GERD, hyperlipidemia, hypertension, prostate cancer, and acute portal vein thrombosis S/p TIPS, partial thrombectomy and coil embolization of splenorenal shunt, who comes for follow-up after recent management of portal vein thrombosis.  The patient was last seen on 02/18/2021.  After being seen in the office he had an admission for worsening hepatic encephalopathy for which he is lactulose was uptitrated.  He eventually underwent a TIPS, partial thrombectomy and coil embolization of splenorenal shunt by Dr. Serafina Royals on 03/05/2021 as his MELD score improved down to 16.  Patient was kept in observation for 1 day after his procedure and he was doing well, was discharged on lactulose and his Coreg was held.  He was only kept on aspirin as after discussion with Dr. Santa Lighter, I consider that he was high risk of rebleeding given his history of recent gastric ulcer.  The patient comes to the office with his wife.  She states that he has been doing well although he is a little bit tired recently and is "not 100% with it".  Both deny any nausea, vomiting, fever,  chills, hematochezia, melena, hematemesis, abdominal distention, abdominal pain, diarrhea, jaundice, pruritus or weight loss.  Cirrhosis related questions: Hematemesis/coffee ground emesis: No History of variceal bleeding: No Abdominal pain: No Abdominal distention/worsening ascitesNo Fever/chills: No Episodes of confusion/disorientation: Has been slightly forgetful and less active than usual Number of daily bowel movements:2-3 on lactulose Taking diuretics?: Yes Prior history of banding?: Yes Prior episodes of SBP: No Last time liver imaging was performed:MRCP 02/12/2021 - no liver or GB masses, possible gallbladder varices, had presence near occlusive thrombosis of the main portal vein  MELD score: 03/06/2021 - 22  Last EGD: 02/13/21 Grade III varices were found in the lower third of the esophagus. Three bands were successfully placed with complete eradication, resulting in deflation of varices. One oozing superficial gastric ulcer with oozing hemorrhage (Forrest Class Ib) was found in the gastric body. The lesion was 4 mm in largest dimension. For hemostasis, two hemostatic clips were successfully placed.  Biopsies of the healthy stomach were taken with a cold forceps for Helicobacter pylori testing. Evidence of a patent Billroth II gastrojejunostomy was found. The gastrojejunal anastomosis was characterized by healthy appearing mucosa. This was traversed. The efferent limb was examined. The afferent limb was examined.  Pathology showed reactive gastropathy but negative for H. pylori or dysplasia.  Patient is scheduled for repeat EGD in January 2022.  Last Colonoscopy: 06/17/2020, congestion in the cecum, diverticulosis in the sigmoid colon, 3 mm polyp in the sigmoid colon, external hemorrhoids.  Polyp was resected but not retrieved.  Past Medical History: Past Medical History:  Diagnosis Date   A-fib ([place])    Arthritis    Hands/Fingers   Asthma    Back  pain    Cirrhosis of liver  (HCC)    Diabetes mellitus ([place])    Enlarged prostate    Gastric ulcer    GERD (gastroesophageal reflux disease)    H/O bladder problems    History of kidney stones    Hypercholesteremia    Hypertension    Prostate cancer ([place])    Thyroid disease    UGI bleed 10/26/2016   Vitamin B12 deficiency 10/26/2016    Past Surgical History: Past Surgical History:  Procedure Laterality Date   CIRCUMCISION     at age 13   COLONOSCOPY N/A 11/13/2015   Procedure: COLONOSCOPY;  Surgeon: Rogene Houston, MD;  [facility];  Service: Endoscopy;  Laterality: N/A;  2:10   COLONOSCOPY WITH PROPOFOL N/A 06/17/2020   Procedure: COLONOSCOPY WITH PROPOFOL;  Surgeon: Rogene Houston, MD;  [facility];  Service: Endoscopy;  Laterality: N/A;  AM / patient had positive covid 05/25/20   ESOPHAGEAL BANDING N/A 02/13/2021   Procedure: ESOPHAGEAL BANDING;  Surgeon: Harvel Quale, MD;  [facility];  Service: Gastroenterology;  Laterality: N/A;   ESOPHAGOGASTRODUODENOSCOPY N/A 06/03/2014   Procedure: ESOPHAGOGASTRODUODENOSCOPY (EGD);  Surgeon: Rogene Houston, MD;  [facility];  Service: Endoscopy;  Laterality: N/A;  730   ESOPHAGOGASTRODUODENOSCOPY N/A 10/26/2016   Procedure: ESOPHAGOGASTRODUODENOSCOPY (EGD);  Surgeon: Rogene Houston, MD;  [facility];  Service: Endoscopy;  Laterality: N/A;   ESOPHAGOGASTRODUODENOSCOPY (EGD) WITH PROPOFOL N/A 06/17/2020   Procedure: ESOPHAGOGASTRODUODENOSCOPY (EGD) WITH PROPOFOL;  Surgeon: Rogene Houston, MD;  [facility];  Service: Endoscopy;  Laterality: N/A;   ESOPHAGOGASTRODUODENOSCOPY (EGD) WITH PROPOFOL N/A 02/13/2021   Procedure: ESOPHAGOGASTRODUODENOSCOPY (EGD) WITH PROPOFOL;  Surgeon: Harvel Quale, MD;  [facility];  Service: Gastroenterology;  Laterality: N/A;   Gastric Ulcer  1993   GIVENS CAPSULE STUDY N/A 10/28/2016   Procedure: GIVENS CAPSULE STUDY;  Surgeon:  Rogene Houston, MD;  [facility];  Service: Endoscopy;  Laterality: N/A;   HERNIA REPAIR     IR EMBO VENOUS NOT HEMORR HEMANG  INC GUIDE ROADMAPPING  03/05/2021   IR INTRAVASCULAR ULTRASOUND NON CORONARY  03/05/2021   IR RADIOLOGIST EVAL & MGMT  02/26/2021   IR THROMBECT VENO MECH MOD SED  03/05/2021   IR TIPS  03/05/2021   IR US GUIDE VASC ACCESS RIGHT  03/05/2021   IR US GUIDE VASC ACCESS RIGHT  03/05/2021   POLYPECTOMY  11/13/2015   Procedure: POLYPECTOMY;  Surgeon: Rogene Houston, MD;  [facility];  Service: Endoscopy;;  colon   POLYPECTOMY  06/17/2020   Procedure: POLYPECTOMY;  Surgeon: Rogene Houston, MD;  [facility];  Service: Endoscopy;;  sigmoid   Right Elbow Right    A pin was put in    Family History: Family History  Problem Relation Age of Onset   Emphysema Mother    Cerebrovascular Accident Father    Heart disease Father    Diabetes Sister    Heart disease Brother    Heart disease Sister    Heart disease Brother    Diabetes Brother     Social History: Social History   Tobacco Use  Smoking Status Former   Types: Cigarettes   Quit date: 05/25/1989   Years since quitting: 31.8  Smokeless Tobacco Former   Quit date: 01/16/1990   Social History   Substance and Sexual Activity  Alcohol Use No  Alcohol/week: 0.0 standard drinks   Comment: Drank many years - 30 years ago   Social History   Substance and Sexual Activity  Drug Use No    Allergies: Allergies  Allergen Reactions   Penicillins Nausea And Vomiting    Has patient had a PCN reaction causing immediate rash, facial/tongue/throat swelling, SOB or lightheadedness with hypotension: Yes Has patient had a PCN reaction causing severe rash involving mucus membranes or skin necrosis: No Has patient had a PCN reaction that required hospitalization: No Has patient had a PCN reaction occurring within the last 10 years: No If all of the above answers are "NO", then  may proceed with Cephalosporin use.   Patient states that it also caused pain in his sides.    Medications: Current Outpatient Medications  Medication Sig Dispense Refill   acetaminophen (TYLENOL) 500 MG tablet Take 500 mg by mouth every 6 (six) hours as needed.     aspirin EC 81 MG tablet Take 1 tablet (81 mg total) by mouth daily with breakfast. Swallow whole. 30 tablet 11   Cranberry 500 MG TABS Take 500 mg by mouth daily.     dicyclomine (BENTYL) 10 MG capsule Take 10 mg by mouth 2 (two) times daily as needed for spasms.     ferrous sulfate 325 (65 FE) MG tablet Take 325 mg by mouth daily with breakfast.     furosemide (LASIX) 20 MG tablet Take 1 tablet by mouth once daily (Patient taking differently: Take 40 mg by mouth daily.) 90 tablet 1   glipiZIDE (GLUCOTROL) 10 MG tablet Take 10 mg by mouth 2 (two) times daily before a meal.     lactulose (CHRONULAC) 10 GM/15ML solution Take 45 mLs (30 g total) by mouth 2 (two) times daily. 473 mL 1   levothyroxine (SYNTHROID) 150 MCG tablet Take 137 mcg by mouth daily before breakfast.     magnesium oxide (MAG-OX) 400 MG tablet Take 400 mg by mouth daily.     pantoprazole (PROTONIX) 40 MG tablet Take 1 tablet (40 mg total) by mouth 2 (two) times daily. 60 tablet 2   simvastatin (ZOCOR) 40 MG tablet Take 40 mg by mouth at bedtime.      spironolactone (ALDACTONE) 50 MG tablet Take 1 tablet (50 mg total) by mouth daily. 30 tablet 5   No current facility-administered medications for this visit.    Review of Systems: GENERAL: negative for malaise, night sweats HEENT: No changes in hearing or vision, no nose bleeds or other nasal problems. NECK: Negative for lumps, goiter, pain and significant neck swelling RESPIRATORY: Negative for cough, wheezing CARDIOVASCULAR: Negative for chest pain, leg swelling, palpitations, orthopnea GI: SEE HPI MUSCULOSKELETAL: Negative for joint pain or swelling, back pain, and muscle pain. SKIN: Negative for  lesions, rash PSYCH: Negative for sleep disturbance, mood disorder and recent psychosocial stressors. HEMATOLOGY Negative for prolonged bleeding, bruising easily, and swollen nodes. ENDOCRINE: Negative for cold or heat intolerance, polyuria, polydipsia and goiter. NEURO: negative for tremor, gait imbalance, syncope and seizures. The remainder of the review of systems is noncontributory.   Physical Exam: BP 99/65 (BP Location: Left Arm, Patient Position: Sitting, Cuff Size: Large)    Pulse (!) 120    Temp (!) 97.3 F (36.3 C) (Oral)    Ht 5' 9"  (1.753 m)    Wt 175 lb 6.4 oz (79.6 kg)    BMI 25.90 kg/m  GENERAL: The patient is AO x2-3, in no acute distress. Looks frail HEENT: Head is  normocephalic and atraumatic. EOMI are intact. Mouth is well hydrated and without lesions. NECK: Supple. No masses LUNGS: Clear to auscultation. No presence of rhonchi/wheezing/rales. Adequate chest expansion HEART: RRR, normal s1 and s2. ABDOMEN: Soft, nontender, no guarding, no peritoneal signs, and nondistended. BS +. No masses. EXTREMITIES: Without any cyanosis, clubbing, rash, lesions or edema. NEUROLOGIC: Aox2-3, no focal motor deficit. No asterixis. SKIN: no jaundice, no rashes  Imaging/Labs: as above  I personally reviewed and interpreted the available labs, imaging and endoscopic files.  Impression and Plan: Hosam Mcfetridge Stice is a 80 y.o. male with past medical history of [name] cirrhosis complicated by nonbleeding grade 2 esophageal varices status post banding, ascites, history of bleeding gastric AVM, peptic ulcer disease status post Billroth II gastrectomy, atrial fibrillation, diabetes, GERD, hyperlipidemia, hypertension, prostate cancer, and acute portal vein thrombosis S/p TIPS, partial thrombectomy and coil embolization of splenorenal shunt, who comes for follow-up after recent management of portal vein thrombosis.  The patient has a complex clinical picture as he developed new onset acute  thrombosis of portal vein and has been managed recently by interventional radiology with  TIPS, partial thrombectomy and coil embolization of splenorenal shunt.  He has done well after this intervention was performed although he had an increase in his MELD score after this procedure was performed.  Neurologically he has been doing well, even though I consider that he may have some degree of overt encephalopathy.  I reinforced to his wife importance of taking the lactulose compliantly but if he presents any worsening neurological symptoms concerning for encephalopathy, we will need to start him on Xifaxan.  We will also need to check a repeat set of MELD labs a week after his most recent procedure to make sure his liver is stable.  Overall, his increasing meld score is related to his kidney function.  The patient is up-to-date in terms of [place] screening.  As he has a patent TIPS, he does not need available currently moment or any repeat esophagogastroduodenospy for surveillance of esophageal varices.  We will need to repeat 1 esophagogastroduodenospy next month to determine if his gastric ulcers healed.  I will also like to stop his diuretics as his blood pressure is in the low side and his TIPS should help avoid ascites accumulation.  - Proceed with scheduled EGD in January - Stop spironolactone and furosemide - Continue lisinopril 5 mg qday - continue lactulose, titrate to achieve 2-4 bowel movements per day - Please notify us if worsening mental status - may need to start Xifaxan twice a day - Check MELD labs on Friday - Reduce salt intake to <2 g per day - Can take Tylenol max of 2 g per day (650 mg q8h) for pain - Avoid NSAIDs for pain - Avoid eating raw oysters/shellfish - Ensure every night before going to sleep - RTC 1 month  All questions were answered.      Harvel Quale, MD Gastroenterology and Hepatology [facility]

## 2021-03-08 NOTE — Patient Instructions (Addendum)
-   Proceed with scheduled EGD in January - Stop spironolactone and furosemide - Continue lisinopril 5 mg qday - continue lactulose, titrate to achieve 2-4 bowel movements per day - Please notify us if worsening mental status - may need to start Xifaxan twice a day - Perform blood workup on Friday - Reduce salt intake to <2 g per day - Can take Tylenol max of 2 g per day (650 mg q8h) for pain - Avoid NSAIDs for pain - Avoid eating raw oysters/shellfish - Ensure every night before going to sleep

## 2021-03-08 NOTE — Progress Notes (Signed)
Tyler Farmer, M.D. Gastroenterology & Hepatology [facility] [address] [place], [place] [address]  Primary Care Physician: [name], MD [place] [address]  I will communicate my assessment and recommendations to the referring MD via EMR.  Problems: NASH cirrhosis Esophageal varices Ascites History of gastric AV malformation status post APC ablation Acute portal vein thrombosis S/p TIPS, partial thrombectomy and coil embolization of splenorenal shunt Gastric ulcer  History of Present Illness: Tyler Farmer is a 80 y.o. male with past medical history of [name] cirrhosis complicated by nonbleeding grade 2 esophageal varices status post banding, ascites, history of bleeding gastric AVM, peptic ulcer disease status post Billroth II gastrectomy, atrial fibrillation, diabetes, GERD, hyperlipidemia, hypertension, prostate cancer, and acute portal vein thrombosis S/p TIPS, partial thrombectomy and coil embolization of splenorenal shunt, who comes for follow-up after recent management of portal vein thrombosis.  The patient was last seen on 02/18/2021.  After being seen in the office he had an admission for worsening hepatic encephalopathy for which he is lactulose was uptitrated.  He eventually underwent a TIPS, partial thrombectomy and coil embolization of splenorenal shunt by Dr. Serafina Royals on 03/05/2021 as his MELD score improved down to 16.  Patient was kept in observation for 1 day after his procedure and he was doing well, was discharged on lactulose and his Coreg was held.  He was only kept on aspirin as after discussion with Dr. Santa Lighter, I consider that he was high risk of rebleeding given his history of recent gastric ulcer.  The patient comes to the office with his wife.  She states that he has been doing well although he is a little bit tired recently and is "not 100% with it".  Both deny any nausea, vomiting, fever,  chills, hematochezia, melena, hematemesis, abdominal distention, abdominal pain, diarrhea, jaundice, pruritus or weight loss.  Cirrhosis related questions: Hematemesis/coffee ground emesis: No History of variceal bleeding: No Abdominal pain: No Abdominal distention/worsening ascitesNo Fever/chills: No Episodes of confusion/disorientation: Has been slightly forgetful and less active than usual Number of daily bowel movements:2-3 on lactulose Taking diuretics?: Yes Prior history of banding?: Yes Prior episodes of SBP: No Last time liver imaging was performed:MRCP 02/12/2021 - no liver or GB masses, possible gallbladder varices, had presence near occlusive thrombosis of the main portal vein  MELD score: 03/06/2021 - 22  Last EGD: 02/13/21 Grade III varices were found in the lower third of the esophagus. Three bands were successfully placed with complete eradication, resulting in deflation of varices. One oozing superficial gastric ulcer with oozing hemorrhage (Forrest Class Ib) was found in the gastric body. The lesion was 4 mm in largest dimension. For hemostasis, two hemostatic clips were successfully placed.  Biopsies of the healthy stomach were taken with a cold forceps for Helicobacter pylori testing. Evidence of a patent Billroth II gastrojejunostomy was found. The gastrojejunal anastomosis was characterized by healthy appearing mucosa. This was traversed. The efferent limb was examined. The afferent limb was examined.  Pathology showed reactive gastropathy but negative for H. pylori or dysplasia.  Patient is scheduled for repeat EGD in January 2022.  Last Colonoscopy: 06/17/2020, congestion in the cecum, diverticulosis in the sigmoid colon, 3 mm polyp in the sigmoid colon, external hemorrhoids.  Polyp was resected but not retrieved.  Past Medical History: Past Medical History:  Diagnosis Date   A-fib ([place])    Arthritis    Hands/Fingers   Asthma    Back  pain    Cirrhosis of liver  (HCC)    Diabetes mellitus ([place])    Enlarged prostate    Gastric ulcer    GERD (gastroesophageal reflux disease)    H/O bladder problems    History of kidney stones    Hypercholesteremia    Hypertension    Prostate cancer ([place])    Thyroid disease    UGI bleed 10/26/2016   Vitamin B12 deficiency 10/26/2016    Past Surgical History: Past Surgical History:  Procedure Laterality Date   CIRCUMCISION     at age 15   COLONOSCOPY N/A 11/13/2015   Procedure: COLONOSCOPY;  Surgeon: Rogene Houston, MD;  [facility];  Service: Endoscopy;  Laterality: N/A;  2:10   COLONOSCOPY WITH PROPOFOL N/A 06/17/2020   Procedure: COLONOSCOPY WITH PROPOFOL;  Surgeon: Rogene Houston, MD;  [facility];  Service: Endoscopy;  Laterality: N/A;  AM / patient had positive covid 05/25/20   ESOPHAGEAL BANDING N/A 02/13/2021   Procedure: ESOPHAGEAL BANDING;  Surgeon: Harvel Quale, MD;  [facility];  Service: Gastroenterology;  Laterality: N/A;   ESOPHAGOGASTRODUODENOSCOPY N/A 06/03/2014   Procedure: ESOPHAGOGASTRODUODENOSCOPY (EGD);  Surgeon: Rogene Houston, MD;  [facility];  Service: Endoscopy;  Laterality: N/A;  730   ESOPHAGOGASTRODUODENOSCOPY N/A 10/26/2016   Procedure: ESOPHAGOGASTRODUODENOSCOPY (EGD);  Surgeon: Rogene Houston, MD;  [facility];  Service: Endoscopy;  Laterality: N/A;   ESOPHAGOGASTRODUODENOSCOPY (EGD) WITH PROPOFOL N/A 06/17/2020   Procedure: ESOPHAGOGASTRODUODENOSCOPY (EGD) WITH PROPOFOL;  Surgeon: Rogene Houston, MD;  [facility];  Service: Endoscopy;  Laterality: N/A;   ESOPHAGOGASTRODUODENOSCOPY (EGD) WITH PROPOFOL N/A 02/13/2021   Procedure: ESOPHAGOGASTRODUODENOSCOPY (EGD) WITH PROPOFOL;  Surgeon: Harvel Quale, MD;  [facility];  Service: Gastroenterology;  Laterality: N/A;   Gastric Ulcer  1993   GIVENS CAPSULE STUDY N/A 10/28/2016   Procedure: GIVENS CAPSULE STUDY;  Surgeon:  Rogene Houston, MD;  [facility];  Service: Endoscopy;  Laterality: N/A;   HERNIA REPAIR     IR EMBO VENOUS NOT HEMORR HEMANG  INC GUIDE ROADMAPPING  03/05/2021   IR INTRAVASCULAR ULTRASOUND NON CORONARY  03/05/2021   IR RADIOLOGIST EVAL & MGMT  02/26/2021   IR THROMBECT VENO MECH MOD SED  03/05/2021   IR TIPS  03/05/2021   IR US GUIDE VASC ACCESS RIGHT  03/05/2021   IR US GUIDE VASC ACCESS RIGHT  03/05/2021   POLYPECTOMY  11/13/2015   Procedure: POLYPECTOMY;  Surgeon: Rogene Houston, MD;  [facility];  Service: Endoscopy;;  colon   POLYPECTOMY  06/17/2020   Procedure: POLYPECTOMY;  Surgeon: Rogene Houston, MD;  [facility];  Service: Endoscopy;;  sigmoid   Right Elbow Right    A pin was put in    Family History: Family History  Problem Relation Age of Onset   Emphysema Mother    Cerebrovascular Accident Father    Heart disease Father    Diabetes Sister    Heart disease Brother    Heart disease Sister    Heart disease Brother    Diabetes Brother     Social History: Social History   Tobacco Use  Smoking Status Former   Types: Cigarettes   Quit date: 05/25/1989   Years since quitting: 31.8  Smokeless Tobacco Former   Quit date: 01/16/1990   Social History   Substance and Sexual Activity  Alcohol Use No  Alcohol/week: 0.0 standard drinks   Comment: Drank many years - 30 years ago   Social History   Substance and Sexual Activity  Drug Use No    Allergies: Allergies  Allergen Reactions   Penicillins Nausea And Vomiting    Has patient had a PCN reaction causing immediate rash, facial/tongue/throat swelling, SOB or lightheadedness with hypotension: Yes Has patient had a PCN reaction causing severe rash involving mucus membranes or skin necrosis: No Has patient had a PCN reaction that required hospitalization: No Has patient had a PCN reaction occurring within the last 10 years: No If all of the above answers are "NO", then  may proceed with Cephalosporin use.   Patient states that it also caused pain in his sides.    Medications: Current Outpatient Medications  Medication Sig Dispense Refill   acetaminophen (TYLENOL) 500 MG tablet Take 500 mg by mouth every 6 (six) hours as needed.     aspirin EC 81 MG tablet Take 1 tablet (81 mg total) by mouth daily with breakfast. Swallow whole. 30 tablet 11   Cranberry 500 MG TABS Take 500 mg by mouth daily.     dicyclomine (BENTYL) 10 MG capsule Take 10 mg by mouth 2 (two) times daily as needed for spasms.     ferrous sulfate 325 (65 FE) MG tablet Take 325 mg by mouth daily with breakfast.     furosemide (LASIX) 20 MG tablet Take 1 tablet by mouth once daily (Patient taking differently: Take 40 mg by mouth daily.) 90 tablet 1   glipiZIDE (GLUCOTROL) 10 MG tablet Take 10 mg by mouth 2 (two) times daily before a meal.     lactulose (CHRONULAC) 10 GM/15ML solution Take 45 mLs (30 g total) by mouth 2 (two) times daily. 473 mL 1   levothyroxine (SYNTHROID) 150 MCG tablet Take 137 mcg by mouth daily before breakfast.     magnesium oxide (MAG-OX) 400 MG tablet Take 400 mg by mouth daily.     pantoprazole (PROTONIX) 40 MG tablet Take 1 tablet (40 mg total) by mouth 2 (two) times daily. 60 tablet 2   simvastatin (ZOCOR) 40 MG tablet Take 40 mg by mouth at bedtime.      spironolactone (ALDACTONE) 50 MG tablet Take 1 tablet (50 mg total) by mouth daily. 30 tablet 5   No current facility-administered medications for this visit.    Review of Systems: GENERAL: negative for malaise, night sweats HEENT: No changes in hearing or vision, no nose bleeds or other nasal problems. NECK: Negative for lumps, goiter, pain and significant neck swelling RESPIRATORY: Negative for cough, wheezing CARDIOVASCULAR: Negative for chest pain, leg swelling, palpitations, orthopnea GI: SEE HPI MUSCULOSKELETAL: Negative for joint pain or swelling, back pain, and muscle pain. SKIN: Negative for  lesions, rash PSYCH: Negative for sleep disturbance, mood disorder and recent psychosocial stressors. HEMATOLOGY Negative for prolonged bleeding, bruising easily, and swollen nodes. ENDOCRINE: Negative for cold or heat intolerance, polyuria, polydipsia and goiter. NEURO: negative for tremor, gait imbalance, syncope and seizures. The remainder of the review of systems is noncontributory.   Physical Exam: BP 99/65 (BP Location: Left Arm, Patient Position: Sitting, Cuff Size: Large)    Pulse (!) 120    Temp (!) 97.3 F (36.3 C) (Oral)    Ht 5' 9"  (1.753 m)    Wt 175 lb 6.4 oz (79.6 kg)    BMI 25.90 kg/m  GENERAL: The patient is AO x2-3, in no acute distress. Looks frail HEENT: Head is  normocephalic and atraumatic. EOMI are intact. Mouth is well hydrated and without lesions. NECK: Supple. No masses LUNGS: Clear to auscultation. No presence of rhonchi/wheezing/rales. Adequate chest expansion HEART: RRR, normal s1 and s2. ABDOMEN: Soft, nontender, no guarding, no peritoneal signs, and nondistended. BS +. No masses. EXTREMITIES: Without any cyanosis, clubbing, rash, lesions or edema. NEUROLOGIC: Aox2-3, no focal motor deficit. No asterixis. SKIN: no jaundice, no rashes  Imaging/Labs: as above  I personally reviewed and interpreted the available labs, imaging and endoscopic files.  Impression and Plan: Tyler Farmer is a 80 y.o. male with past medical history of [name] cirrhosis complicated by nonbleeding grade 2 esophageal varices status post banding, ascites, history of bleeding gastric AVM, peptic ulcer disease status post Billroth II gastrectomy, atrial fibrillation, diabetes, GERD, hyperlipidemia, hypertension, prostate cancer, and acute portal vein thrombosis S/p TIPS, partial thrombectomy and coil embolization of splenorenal shunt, who comes for follow-up after recent management of portal vein thrombosis.  The patient has a complex clinical picture as he developed new onset acute  thrombosis of portal vein and has been managed recently by interventional radiology with  TIPS, partial thrombectomy and coil embolization of splenorenal shunt.  He has done well after this intervention was performed although he had an increase in his MELD score after this procedure was performed.  Neurologically he has been doing well, even though I consider that he may have some degree of overt encephalopathy.  I reinforced to his wife importance of taking the lactulose compliantly but if he presents any worsening neurological symptoms concerning for encephalopathy, we will need to start him on Xifaxan.  We will also need to check a repeat set of MELD labs a week after his most recent procedure to make sure his liver is stable.  Overall, his increasing meld score is related to his kidney function.  The patient is up-to-date in terms of [place] screening.  As he has a patent TIPS, he does not need available currently moment or any repeat esophagogastroduodenospy for surveillance of esophageal varices.  We will need to repeat 1 esophagogastroduodenospy next month to determine if his gastric ulcers healed.  I will also like to stop his diuretics as his blood pressure is in the low side and his TIPS should help avoid ascites accumulation.  - Proceed with scheduled EGD in January - Stop spironolactone and furosemide - Continue lisinopril 5 mg qday - continue lactulose, titrate to achieve 2-4 bowel movements per day - Please notify us if worsening mental status - may need to start Xifaxan twice a day - Check MELD labs on Friday - Reduce salt intake to <2 g per day - Can take Tylenol max of 2 g per day (650 mg q8h) for pain - Avoid NSAIDs for pain - Avoid eating raw oysters/shellfish - Ensure every night before going to sleep - RTC 1 month  All questions were answered.      Harvel Quale, MD Gastroenterology and Hepatology [facility]

## 2021-03-08 NOTE — Telephone Encounter (Signed)
Patient wife aware patient had an appointment today 03/08/2021 with Dr. Jenetta Downer.

## 2021-03-08 NOTE — Telephone Encounter (Signed)
Patient was seen today 03/08/2021 in the office by Dr. Jenetta Downer, he gave the patient the orders for the blood work to have done.

## 2021-03-08 NOTE — Telephone Encounter (Signed)
Tyler Quale, MD  [place], [name], CMA; [name], [name]; [name], CMA Good morning,  Hope this finds you well.  Please do the following:  1. Cancel CT abdomen  2. Schedule him for follow up appt in 1-2 weeks - can put him as an overbook with me  3. Please check CMP and INR in 1 week   Thanks   [name], MD  Gastroenterology and Hepatology  [facility]

## 2021-03-10 ENCOUNTER — Other Ambulatory Visit (INDEPENDENT_AMBULATORY_CARE_PROVIDER_SITE_OTHER): Payer: Self-pay | Admitting: Gastroenterology

## 2021-03-10 ENCOUNTER — Telehealth (INDEPENDENT_AMBULATORY_CARE_PROVIDER_SITE_OTHER): Payer: Self-pay | Admitting: *Deleted

## 2021-03-10 DIAGNOSIS — R112 Nausea with vomiting, unspecified: Secondary | ICD-10-CM

## 2021-03-10 DIAGNOSIS — K7682 Hepatic encephalopathy: Secondary | ICD-10-CM

## 2021-03-10 MED ORDER — RIFAXIMIN 550 MG PO TABS: 550.0000 mg | ORAL_TABLET | Freq: Two times a day (BID) | ORAL | 3 refills | Status: DC

## 2021-03-10 MED ORDER — ONDANSETRON HCL 4 MG PO TABS: 4.0000 mg | ORAL_TABLET | Freq: Three times a day (TID) | ORAL | 1 refills | Status: AC | PRN

## 2021-03-10 NOTE — Telephone Encounter (Signed)
Prior Authorization done on Xifaxan 550 mg, It was approved by the insurance but co pay on the medication was [phone] for a thirty day supply. I have given samples # 63 to the patient to pick up from the office on 03/11/2021. I spoke with patient wife and I advised that we needed to try to get patient assistance for this medication through [facility] and she would need to come by the office with her financial information and sign the forms. I had [facility] to fax me the cost of the medication as this is required to be submitted to [facility] when I submit the application and financial information. Patient wife to come to office 03/11/2021 to sign forms and bring financial information and pick up samples. I asked that she try to get this done as soon as possible before the end of the year 2021,so we do not delay patient treatment.

## 2021-03-10 NOTE — Telephone Encounter (Signed)
Wife Inez Catalina called and left voicemail that pt was having nausea and weakness and she wanted to know what time he had to go do bloodwork this Friday. I called back and spoke to [place]. I let her know [facility] is open regular hours on Friday and she states he vomited once this morning which is first time he vomited since his surgery. He ate breaksfast and kept it down and states he is feeling better now and nausea is better. She states he seemed more weak yesterday and more confused. States today he seems better but still a little confused. She is giving lactulose 45 cc's twice daily and states he had one good BM last night.   [phone]

## 2021-03-10 NOTE — Telephone Encounter (Signed)
Thanks for the update.  I'll send Xifaxan 550 mg twice a day, which he needs to take indefinitely, and some Zofran for the nausea. She need to increase the dose of lactulose if he is moving his bowels less than one time a day.

## 2021-03-10 NOTE — Telephone Encounter (Signed)
Called and discussed with pt's wife Inez Catalina per Dr. Jenetta Downer - I'll send Xifaxan 550 mg twice a day, which he needs to take indefinitely, and some Zofran for the nausea. She need to increase the dose of lactulose if he is moving his bowels less than one time a day.  She verbalized understanding of all.

## 2021-03-11 ENCOUNTER — Other Ambulatory Visit (INDEPENDENT_AMBULATORY_CARE_PROVIDER_SITE_OTHER): Payer: Self-pay | Admitting: Gastroenterology

## 2021-03-11 DIAGNOSIS — K7682 Hepatic encephalopathy: Secondary | ICD-10-CM

## 2021-03-11 MED ORDER — LACTULOSE 10 GM/15ML PO SOLN: 30.0000 g | Freq: Two times a day (BID) | ORAL | 5 refills | Status: DC

## 2021-03-11 NOTE — Telephone Encounter (Signed)
Patient brought income information and insurance information to the office and she completed the forms. I have faxed the income,insurance and patient assistance forms to [facility]. Will await determination. Patient wife also picked up the samples of the Xifaxan # 63 and I advised patient will need to take one bid.

## 2021-03-12 ENCOUNTER — Encounter (HOSPITAL_COMMUNITY): Payer: Medicare Other

## 2021-03-12 DIAGNOSIS — R188 Other ascites: Secondary | ICD-10-CM | POA: Diagnosis not present

## 2021-03-12 DIAGNOSIS — Z95828 Presence of other vascular implants and grafts: Secondary | ICD-10-CM | POA: Diagnosis not present

## 2021-03-12 DIAGNOSIS — K7460 Unspecified cirrhosis of liver: Secondary | ICD-10-CM | POA: Diagnosis not present

## 2021-03-12 DIAGNOSIS — K259 Gastric ulcer, unspecified as acute or chronic, without hemorrhage or perforation: Secondary | ICD-10-CM | POA: Diagnosis not present

## 2021-03-13 LAB — CBC WITH DIFFERENTIAL/PLATELET
Absolute Monocytes: 958 cells/uL — ABNORMAL HIGH (ref 200–950)
Basophils Absolute: 68 cells/uL (ref 0–200)
Basophils Relative: 0.9 %
Eosinophils Absolute: 646 cells/uL — ABNORMAL HIGH (ref 15–500)
Eosinophils Relative: 8.5 %
HCT: 38.6 % (ref 38.5–50.0)
Hemoglobin: 12.9 g/dL — ABNORMAL LOW (ref 13.2–17.1)
Lymphs Abs: 1216 cells/uL (ref 850–3900)
MCH: 30 pg (ref 27.0–33.0)
MCHC: 33.4 g/dL (ref 32.0–36.0)
MCV: 89.8 fL (ref 80.0–100.0)
MPV: 10.1 fL (ref 7.5–12.5)
Monocytes Relative: 12.6 %
Neutro Abs: 4712 cells/uL (ref 1500–7800)
Neutrophils Relative %: 62 %
Platelets: 188 10*3/uL (ref 140–400)
RBC: 4.3 10*6/uL (ref 4.20–5.80)
RDW: 15.9 % — ABNORMAL HIGH (ref 11.0–15.0)
Total Lymphocyte: 16 %
WBC: 7.6 10*3/uL (ref 3.8–10.8)

## 2021-03-13 LAB — PROTIME-INR
INR: 1.2 — ABNORMAL HIGH
Prothrombin Time: 12 s — ABNORMAL HIGH (ref 9.0–11.5)

## 2021-03-13 LAB — COMPREHENSIVE METABOLIC PANEL
AG Ratio: 0.9 (calc) — ABNORMAL LOW (ref 1.0–2.5)
ALT: 26 U/L (ref 9–46)
AST: 39 U/L — ABNORMAL HIGH (ref 10–35)
Albumin: 3.4 g/dL — ABNORMAL LOW (ref 3.6–5.1)
Alkaline phosphatase (APISO): 132 U/L (ref 35–144)
BUN/Creatinine Ratio: 15 (calc) (ref 6–22)
BUN: 34 mg/dL — ABNORMAL HIGH (ref 7–25)
CO2: 26 mmol/L (ref 20–32)
Calcium: 9.1 mg/dL (ref 8.6–10.3)
Chloride: 102 mmol/L (ref 98–110)
Creat: 2.25 mg/dL — ABNORMAL HIGH (ref 0.70–1.22)
Globulin: 4 g/dL (calc) — ABNORMAL HIGH (ref 1.9–3.7)
Glucose, Bld: 188 mg/dL — ABNORMAL HIGH (ref 65–139)
Potassium: 4.5 mmol/L (ref 3.5–5.3)
Sodium: 138 mmol/L (ref 135–146)
Total Bilirubin: 2.1 mg/dL — ABNORMAL HIGH (ref 0.2–1.2)
Total Protein: 7.4 g/dL (ref 6.1–8.1)

## 2021-03-16 ENCOUNTER — Other Ambulatory Visit: Payer: Self-pay | Admitting: Interventional Radiology

## 2021-03-16 DIAGNOSIS — I8500 Esophageal varices without bleeding: Secondary | ICD-10-CM

## 2021-03-16 NOTE — Telephone Encounter (Signed)
I spoke with Nilda Simmer with [employer] they have received the patient's Application and will make a determination in the next few days and let the office know the outcome.

## 2021-03-17 ENCOUNTER — Ambulatory Visit (HOSPITAL_COMMUNITY): Admit: 2021-03-17 | Payer: Medicare Other | Admitting: Gastroenterology

## 2021-03-17 ENCOUNTER — Encounter (INDEPENDENT_AMBULATORY_CARE_PROVIDER_SITE_OTHER): Payer: Self-pay

## 2021-03-17 ENCOUNTER — Encounter (HOSPITAL_COMMUNITY): Payer: Self-pay

## 2021-03-17 SURGERY — ESOPHAGOGASTRODUODENOSCOPY (EGD) WITH PROPOFOL
Anesthesia: Monitor Anesthesia Care

## 2021-03-17 NOTE — Telephone Encounter (Signed)
Thanks so much Crystal, I know Tyler Farmer and his wife really appreciate these efforts

## 2021-03-17 NOTE — Telephone Encounter (Signed)
03/17/2021: I received a denial letter today from [facility] for assistance with Xifaxan, stating the patient had prescription coverage with his insurance and they would not take the print out from [place] stating the cost out of pocket to the patient. I spoke with Remo Lipps at [facility] he says the patient will need a letter from the Doctor stating the patient can not afford his portion of the out of pocket expense. I have typed up an appeal letter to [facility] and faxed it back to [place] today 03/17/2021, stating we realize the patient has insurance coverage,but this is still too expensive for the patient as a thirty day supply will cost the patient out of pocket [phone]. I expressed that the patient was in need of the medication and was doing well on the samples we have provided. I asked that they reconsider this denial. Awaiting a re determination w the appeal letter.

## 2021-03-18 NOTE — Telephone Encounter (Signed)
Excellent, thanks so much!

## 2021-03-18 NOTE — Telephone Encounter (Signed)
I received a letter from [place] stating the patient is approved for free medication Xifaxan through Dec 31,2023. Patient wife aware she will need to call [facility] at [phone] to set up shipment of medication. I have place a reminder in patient file to re submit a new enrollment form for 2024.

## 2021-03-23 ENCOUNTER — Telehealth (INDEPENDENT_AMBULATORY_CARE_PROVIDER_SITE_OTHER): Payer: Self-pay

## 2021-03-23 ENCOUNTER — Other Ambulatory Visit (INDEPENDENT_AMBULATORY_CARE_PROVIDER_SITE_OTHER): Payer: Self-pay | Admitting: Gastroenterology

## 2021-03-23 DIAGNOSIS — I81 Portal vein thrombosis: Secondary | ICD-10-CM | POA: Diagnosis not present

## 2021-03-23 DIAGNOSIS — J438 Other emphysema: Secondary | ICD-10-CM | POA: Diagnosis not present

## 2021-03-23 DIAGNOSIS — R410 Disorientation, unspecified: Secondary | ICD-10-CM | POA: Diagnosis not present

## 2021-03-23 DIAGNOSIS — I10 Essential (primary) hypertension: Secondary | ICD-10-CM | POA: Diagnosis not present

## 2021-03-23 DIAGNOSIS — I8500 Esophageal varices without bleeding: Secondary | ICD-10-CM | POA: Diagnosis not present

## 2021-03-23 DIAGNOSIS — R531 Weakness: Secondary | ICD-10-CM | POA: Diagnosis not present

## 2021-03-23 DIAGNOSIS — K7682 Hepatic encephalopathy: Secondary | ICD-10-CM | POA: Diagnosis not present

## 2021-03-23 DIAGNOSIS — N179 Acute kidney failure, unspecified: Secondary | ICD-10-CM | POA: Diagnosis not present

## 2021-03-23 MED ORDER — LACTULOSE 10 GM/15ML PO SOLN: 30.0000 g | Freq: Two times a day (BID) | ORAL | 3 refills | Status: DC

## 2021-03-23 NOTE — Telephone Encounter (Signed)
Sent refill that will last 30 days to pharmacy. Thanks Yes, he should take both Xifaxan and lactulose as indicated

## 2021-03-23 NOTE — Patient Instructions (Signed)
Tyler Farmer  03/23/2021     @PREFPERIOPPHARMACY @   Your procedure is scheduled on  03/30/2021.   Report to [facility] at  0830  A.M.   Call this number if you have problems the morning of surgery:  [phone]   Remember:  Follow the diet instructions given to you by the office.    Take these medicines the morning of surgery with A SIP OF WATER                       levothyroxine, zofran (if needed), protonix.      Do not wear jewelry, make-up or nail polish.  Do not wear lotions, powders, or perfumes, or deodorant.  Do not shave 48 hours prior to surgery.  Men may shave face and neck.  Do not bring valuables to the hospital.  [facility] is not responsible for any belongings or valuables.  Contacts, dentures or bridgework may not be worn into surgery.  Leave your suitcase in the car.  After surgery it may be brought to your room.  For patients admitted to the hospital, discharge time will be determined by your treatment team.  Patients discharged the day of surgery will not be allowed to drive home and must have someone with them for 24 hours.    Special instructions:   DO NOT smoke tobacco or vape fore 24 hours before your procedure.  Please read over the following fact sheets that you were given. Anesthesia Post-op Instructions and Care and Recovery After Surgery      Upper Endoscopy, Adult, Care After This sheet gives you information about how to care for yourself after your procedure. Your health care provider may also give you more specific instructions. If you have problems or questions, contact your health care provider. What can I expect after the procedure? After the procedure, it is common to have: A sore throat. Mild stomach pain or discomfort. Bloating. Nausea. Follow these instructions at home:  Follow instructions from your health care provider about what to eat or drink after your procedure. Return to your normal activities as told  by your health care provider. Ask your health care provider what activities are safe for you. Take over-the-counter and prescription medicines only as told by your health care provider. If you were given a sedative during the procedure, it can affect you for several hours. Do not drive or operate machinery until your health care provider says that it is safe. Keep all follow-up visits as told by your health care provider. This is important. Contact a health care provider if you have: A sore throat that lasts longer than one day. Trouble swallowing. Get help right away if: You vomit blood or your vomit looks like coffee grounds. You have: A fever. Bloody, black, or tarry stools. A severe sore throat or you cannot swallow. Difficulty breathing. Severe pain in your chest or abdomen. Summary After the procedure, it is common to have a sore throat, mild stomach discomfort, bloating, and nausea. If you were given a sedative during the procedure, it can affect you for several hours. Do not drive or operate machinery until your health care provider says that it is safe. Follow instructions from your health care provider about what to eat or drink after your procedure. Return to your normal activities as told by your health care provider. This information is not intended to replace advice given to you by your health  care provider. Make sure you discuss any questions you have with your health care provider. Document Revised: 01/11/2019 Document Reviewed: 08/07/2017 Elsevier Patient Education  2022 [employer] After This sheet gives you information about how to care for yourself after your procedure. Your health care provider may also give you more specific instructions. If you have problems or questions, contact your health care provider. What can I expect after the procedure? After the procedure, it is common to have: Tiredness. Forgetfulness about what happened  after the procedure. Impaired judgment for important decisions. Nausea or vomiting. Some difficulty with balance. Follow these instructions at home: For the time period you were told by your health care provider:   Rest as needed. Do not participate in activities where you could fall or become injured. Do not drive or use machinery. Do not drink alcohol. Do not take sleeping pills or medicines that cause drowsiness. Do not make important decisions or sign legal documents. Do not take care of children on your own. Eating and drinking Follow the diet that is recommended by your health care provider. Drink enough fluid to keep your urine pale yellow. If you vomit: Drink water, juice, or soup when you can drink without vomiting. Make sure you have little or no nausea before eating solid foods. General instructions Have a responsible adult stay with you for the time you are told. It is important to have someone help care for you until you are awake and alert. Take over-the-counter and prescription medicines only as told by your health care provider. If you have sleep apnea, surgery and certain medicines can increase your risk for breathing problems. Follow instructions from your health care provider about wearing your sleep device: Anytime you are sleeping, including during daytime naps. While taking prescription pain medicines, sleeping medicines, or medicines that make you drowsy. Avoid smoking. Keep all follow-up visits as told by your health care provider. This is important. Contact a health care provider if: You keep feeling nauseous or you keep vomiting. You feel light-headed. You are still sleepy or having trouble with balance after 24 hours. You develop a rash. You have a fever. You have redness or swelling around the IV site. Get help right away if: You have trouble breathing. You have new-onset confusion at home. Summary For several hours after your procedure, you may feel  tired. You may also be forgetful and have poor judgment. Have a responsible adult stay with you for the time you are told. It is important to have someone help care for you until you are awake and alert. Rest as told. Do not drive or operate machinery. Do not drink alcohol or take sleeping pills. Get help right away if you have trouble breathing, or if you suddenly become confused. This information is not intended to replace advice given to you by your health care provider. Make sure you discuss any questions you have with your health care provider. Document Revised: 11/21/2019 Document Reviewed: 02/07/2019 Elsevier Patient Education  2022 [employer].

## 2021-03-23 NOTE — Telephone Encounter (Signed)
Patient wife called stating the Lactulose is not lasting the patient the five days. She states the patient takes 45 ml bid. She would like the quantity to be increased so they will not have to fill the medication so frequently. Need new rx sent to [facility].   They also want to know if the patient will have to stay on the Lactulose and take the Xifaxan also. Please advise.

## 2021-03-23 NOTE — Telephone Encounter (Signed)
Tried calling-no answer.  

## 2021-03-24 ENCOUNTER — Other Ambulatory Visit (INDEPENDENT_AMBULATORY_CARE_PROVIDER_SITE_OTHER): Payer: Self-pay

## 2021-03-24 ENCOUNTER — Telehealth (INDEPENDENT_AMBULATORY_CARE_PROVIDER_SITE_OTHER): Payer: Self-pay

## 2021-03-24 DIAGNOSIS — I8500 Esophageal varices without bleeding: Secondary | ICD-10-CM

## 2021-03-24 NOTE — Telephone Encounter (Signed)
Patient wife aware of all. I also advise for her to follow up with [facility] to make sure the shipment on Xifaxan is on its way to their home.

## 2021-03-24 NOTE — Telephone Encounter (Signed)
Xifaxan first shipment to be delivered to [place], subsequent shipments will be sent to patient home.   Patient wife aware of all and also aware she will have to call the [facility] patient assistance program for all future shipment refills.

## 2021-03-25 ENCOUNTER — Ambulatory Visit (INDEPENDENT_AMBULATORY_CARE_PROVIDER_SITE_OTHER): Payer: Medicare Other | Admitting: Gastroenterology

## 2021-03-26 ENCOUNTER — Other Ambulatory Visit: Payer: Self-pay

## 2021-03-26 ENCOUNTER — Encounter (HOSPITAL_COMMUNITY)
Admission: RE | Admit: 2021-03-26 | Discharge: 2021-03-26 | Disposition: A | Discharge status: Home / Self Care | Payer: Medicare Other | Source: Ambulatory Visit | Attending: Gastroenterology | Admitting: Gastroenterology

## 2021-03-26 ENCOUNTER — Encounter (HOSPITAL_COMMUNITY): Payer: Self-pay

## 2021-03-29 ENCOUNTER — Ambulatory Visit (HOSPITAL_COMMUNITY): Payer: Medicare Other

## 2021-03-29 NOTE — Telephone Encounter (Signed)
I had a message from Mrs.Consiglio on vm 03/29/2021. I called and left a message that if she still needs our assistance to please call our office.

## 2021-03-29 NOTE — Telephone Encounter (Signed)
Patient wife aware the medication should arrive at their home in the next 1-2 days and I advised that she please let me know if they get it or not.

## 2021-03-29 NOTE — Telephone Encounter (Signed)
Spoke with patient wife she had questions regarding patient procedure tomorrow at [facility]. I transferred her to [facility] to answer the patient questions.

## 2021-03-29 NOTE — Telephone Encounter (Signed)
I spoke with Juliann Pulse with [facility]. She states the medication was returned to them, for reasons she does not know. I advised that the patient was running low on samples,could she please expedite the order. She states she will and the patient should receive at his address in the next 1-2 days.

## 2021-03-30 ENCOUNTER — Other Ambulatory Visit: Payer: Self-pay

## 2021-03-30 ENCOUNTER — Encounter (HOSPITAL_COMMUNITY): Admission: RE | Disposition: A | Discharge status: Home / Self Care | Payer: Self-pay | Source: Home / Self Care

## 2021-03-30 ENCOUNTER — Emergency Department (HOSPITAL_COMMUNITY)
Admission: EM | Admit: 2021-03-30 | Discharge: 2021-03-30 | Disposition: A | Discharge status: Home / Self Care | Payer: Medicare Other | Source: Home / Self Care | Attending: Student | Admitting: Student

## 2021-03-30 ENCOUNTER — Ambulatory Visit (HOSPITAL_COMMUNITY)
Admission: RE | Admit: 2021-03-30 | Discharge: 2021-03-30 | Disposition: A | Discharge status: Home / Self Care | Payer: Medicare Other | Attending: Gastroenterology | Admitting: Gastroenterology

## 2021-03-30 ENCOUNTER — Ambulatory Visit (HOSPITAL_COMMUNITY): Payer: Medicare Other | Admitting: Anesthesiology

## 2021-03-30 ENCOUNTER — Encounter (HOSPITAL_COMMUNITY): Payer: Self-pay | Admitting: Gastroenterology

## 2021-03-30 ENCOUNTER — Encounter (HOSPITAL_COMMUNITY): Payer: Self-pay

## 2021-03-30 ENCOUNTER — Telehealth: Payer: Medicare Other

## 2021-03-30 DIAGNOSIS — Z538 Procedure and treatment not carried out for other reasons: Secondary | ICD-10-CM | POA: Diagnosis not present

## 2021-03-30 DIAGNOSIS — Z8546 Personal history of malignant neoplasm of prostate: Secondary | ICD-10-CM | POA: Insufficient documentation

## 2021-03-30 DIAGNOSIS — Z79899 Other long term (current) drug therapy: Secondary | ICD-10-CM | POA: Insufficient documentation

## 2021-03-30 DIAGNOSIS — Z79891 Long term (current) use of opiate analgesic: Secondary | ICD-10-CM | POA: Diagnosis not present

## 2021-03-30 DIAGNOSIS — Z87891 Personal history of nicotine dependence: Secondary | ICD-10-CM | POA: Insufficient documentation

## 2021-03-30 DIAGNOSIS — Z7984 Long term (current) use of oral hypoglycemic drugs: Secondary | ICD-10-CM | POA: Insufficient documentation

## 2021-03-30 DIAGNOSIS — J449 Chronic obstructive pulmonary disease, unspecified: Secondary | ICD-10-CM | POA: Insufficient documentation

## 2021-03-30 DIAGNOSIS — Z7982 Long term (current) use of aspirin: Secondary | ICD-10-CM | POA: Insufficient documentation

## 2021-03-30 DIAGNOSIS — I8500 Esophageal varices without bleeding: Secondary | ICD-10-CM | POA: Insufficient documentation

## 2021-03-30 DIAGNOSIS — I10 Essential (primary) hypertension: Secondary | ICD-10-CM | POA: Insufficient documentation

## 2021-03-30 DIAGNOSIS — J45909 Unspecified asthma, uncomplicated: Secondary | ICD-10-CM | POA: Insufficient documentation

## 2021-03-30 DIAGNOSIS — E119 Type 2 diabetes mellitus without complications: Secondary | ICD-10-CM | POA: Insufficient documentation

## 2021-03-30 DIAGNOSIS — I4891 Unspecified atrial fibrillation: Secondary | ICD-10-CM | POA: Insufficient documentation

## 2021-03-30 LAB — TROPONIN I (HIGH SENSITIVITY)
Troponin I (High Sensitivity): 13 ng/L (ref <18)
Troponin I (High Sensitivity): 14 ng/L (ref <18)

## 2021-03-30 LAB — CBC WITH DIFFERENTIAL/PLATELET
Abs Immature Granulocytes: 0.02 10*3/uL (ref 0.00–0.07)
Basophils Absolute: 0.1 10*3/uL (ref 0.0–0.1)
Basophils Relative: 2 %
Eosinophils Absolute: 0.5 10*3/uL (ref 0.0–0.5)
Eosinophils Relative: 12 %
HCT: 38.6 % — ABNORMAL LOW (ref 39.0–52.0)
Hemoglobin: 11.9 g/dL — ABNORMAL LOW (ref 13.0–17.0)
Immature Granulocytes: 1 %
Lymphocytes Relative: 19 %
Lymphs Abs: 0.7 10*3/uL (ref 0.7–4.0)
MCH: 29.5 pg (ref 26.0–34.0)
MCHC: 30.8 g/dL (ref 30.0–36.0)
MCV: 95.5 fL (ref 80.0–100.0)
Monocytes Absolute: 0.4 10*3/uL (ref 0.1–1.0)
Monocytes Relative: 10 %
Neutro Abs: 2.3 10*3/uL (ref 1.7–7.7)
Neutrophils Relative %: 56 %
Platelets: 89 10*3/uL — ABNORMAL LOW (ref 150–400)
RBC: 4.04 MIL/uL — ABNORMAL LOW (ref 4.22–5.81)
RDW: 17.5 % — ABNORMAL HIGH (ref 11.5–15.5)
WBC: 3.9 10*3/uL — ABNORMAL LOW (ref 4.0–10.5)
nRBC: 0 % (ref 0.0–0.2)

## 2021-03-30 LAB — I-STAT CHEM 8, ED
BUN: 13 mg/dL (ref 8–23)
Calcium, Ion: 1.14 mmol/L — ABNORMAL LOW (ref 1.15–1.40)
Chloride: 107 mmol/L (ref 98–111)
Creatinine, Ser: 1.4 mg/dL — ABNORMAL HIGH (ref 0.61–1.24)
Glucose, Bld: 111 mg/dL — ABNORMAL HIGH (ref 70–99)
HCT: 36 % — ABNORMAL LOW (ref 39.0–52.0)
Hemoglobin: 12.2 g/dL — ABNORMAL LOW (ref 13.0–17.0)
Potassium: 4.2 mmol/L (ref 3.5–5.1)
Sodium: 140 mmol/L (ref 135–145)
TCO2: 24 mmol/L (ref 22–32)

## 2021-03-30 LAB — CBG MONITORING, ED: Glucose-Capillary: 110 mg/dL — ABNORMAL HIGH (ref 70–99)

## 2021-03-30 LAB — GLUCOSE, CAPILLARY
Glucose-Capillary: 52 mg/dL — ABNORMAL LOW (ref 70–99)
Glucose-Capillary: 98 mg/dL (ref 70–99)

## 2021-03-30 SURGERY — ESOPHAGOGASTRODUODENOSCOPY (EGD) WITH PROPOFOL
Anesthesia: General

## 2021-03-30 MED ORDER — DEXTROSE 50 % IV SOLN
INTRAVENOUS | Status: AC
  Filled 2021-03-30: qty 50

## 2021-03-30 MED ORDER — DEXTROSE 50 % IV SOLN
25.0000 mL | Freq: Once | INTRAVENOUS | Status: AC
  Administered 2021-03-30: 25 mL via INTRAVENOUS

## 2021-03-30 MED ORDER — CARVEDILOL 3.125 MG PO TABS: 3.1250 mg | ORAL_TABLET | Freq: Two times a day (BID) | ORAL | 0 refills | Status: DC

## 2021-03-30 MED ORDER — LACTATED RINGERS IV BOLUS
1000.0000 mL | Freq: Once | INTRAVENOUS | Status: AC
Start: 2021-03-30 — End: 2021-03-30
  Administered 2021-03-30: 1000 mL via INTRAVENOUS

## 2021-03-30 MED ORDER — LACTATED RINGERS IV SOLN: INTRAVENOUS | Status: DC

## 2021-03-30 MED ORDER — DILTIAZEM HCL 25 MG/5ML IV SOLN
10.0000 mg | Freq: Once | INTRAVENOUS | Status: DC
  Filled 2021-03-30: qty 5

## 2021-03-30 MED ORDER — METOPROLOL TARTRATE 5 MG/5ML IV SOLN
10.0000 mg | Freq: Once | INTRAVENOUS | Status: AC
Start: 2021-03-30 — End: 2021-03-30
  Administered 2021-03-30: 10 mg via INTRAVENOUS
  Filled 2021-03-30: qty 10

## 2021-03-30 MED ORDER — DILTIAZEM HCL-DEXTROSE 125-5 MG/125ML-% IV SOLN (PREMIX): 5.0000 mg/h | INTRAVENOUS | Status: DC

## 2021-03-30 NOTE — ED Provider Notes (Signed)
[facility] Provider Note  CSN: [id number] Arrival date & time: 03/30/21 8182  Chief Complaint(s) Atrial Fibrillation  HPI Krystopher Kuenzel Tessmer is a 81 y.o. male with PMH A. fib, cirrhosis status post TIPS who presents the emergency department for evaluation of rapid heart rate.  Patient was in the endoscopy suite today for an EGD for esophageal varices when he was found to be in A. fib with RVR.  Patient has a history of A. fib and is currently not anticoagulated due to his esophageal varices and from chart review it appears he has been taken off of any rate control medications.  Patient's blood sugar was in the 50s in the endoscopy suite and he was given a half amp of D50.  He denies chest pain, shortness of breath, Donnell pain, nausea, vomiting or other systemic symptoms.   Atrial Fibrillation   Past Medical History Past Medical History:  Diagnosis Date   A-fib ([place])    Arthritis    Hands/Fingers   Asthma    Back pain    Cirrhosis of liver (HCC)    Diabetes mellitus ([place])    Enlarged prostate    Gastric ulcer    GERD (gastroesophageal reflux disease)    H/O bladder problems    History of kidney stones    Hypercholesteremia    Hypertension    Prostate cancer ([place])    Thyroid disease    UGI bleed 10/26/2016   Vitamin B12 deficiency 10/26/2016   Patient Active Problem List   Diagnosis Date Noted   S/P TIPS (transjugular intrahepatic portosystemic shunt) 03/05/2021   Encephalopathy, hepatic 02/19/2021   Portal vein thrombosis 02/12/2021   UGI bleed 10/26/2016   Vitamin B12 deficiency 10/26/2016   AVM (arteriovenous malformation) 10/25/2016   Gastric ulcer    Symptomatic anemia    IDA (iron deficiency anemia) 10/23/2015   Heme positive stool 10/23/2015   Hyperlipidemia 05/27/2014   HTN (hypertension) 05/27/2014   DM (diabetes mellitus) ([place]) 05/27/2014   NASH Liver Cirrhosis ([place]) 05/27/2014   COPD (chronic obstructive pulmonary disease) ([place])  05/27/2014   Home Medication(s) Prior to Admission medications   Medication Sig Start Date End Date Taking? Authorizing Provider  carvedilol (COREG) 3.125 MG tablet Take 1 tablet (3.125 mg total) by mouth 2 (two) times daily with a meal. 03/30/21 04/29/21 Yes Rehema Muffley, MD  acetaminophen (TYLENOL) 500 MG tablet Take 500 mg by mouth every 6 (six) hours as needed.    [provider]  aspirin EC 81 MG tablet Take 1 tablet (81 mg total) by mouth daily with breakfast. Swallow whole. 02/21/21   Roxan Hockey, MD  Cranberry 500 MG TABS Take 500 mg by mouth daily.    [provider]  dicyclomine (BENTYL) 10 MG capsule Take 10 mg by mouth 2 (two) times daily as needed for spasms. 07/14/20   Rogene Houston, MD  ferrous sulfate 325 (65 FE) MG tablet Take 325 mg by mouth daily with breakfast.    [provider]  glipiZIDE (GLUCOTROL) 10 MG tablet Take 10 mg by mouth 2 (two) times daily before a meal.    [provider]  lactulose (CHRONULAC) 10 GM/15ML solution Take 45 mLs (30 g total) by mouth 2 (two) times daily. 03/23/21 04/22/21  Harvel Quale, MD  levothyroxine (SYNTHROID) 137 MCG tablet Take 137 mcg by mouth daily before breakfast.    [provider]  lisinopril (ZESTRIL) 5 MG tablet Take 5 mg by mouth daily. 02/25/21  [provider]  magnesium oxide (MAG-OX) 400 MG tablet Take 400 mg by mouth daily.    [provider]  ondansetron (ZOFRAN) 4 MG tablet Take 1 tablet (4 mg total) by mouth every 8 (eight) hours as needed for nausea or vomiting. 03/10/21   Montez Morita, Quillian Quince, MD  pantoprazole (PROTONIX) 40 MG tablet Take 1 tablet (40 mg total) by mouth 2 (two) times daily. 02/14/21 03/17/21  Manuella Ghazi, Pratik D, DO  rifaximin (XIFAXAN) 550 MG TABS tablet Take 1 tablet (550 mg total) by mouth 2 (two) times daily. Patient not taking: Reported on 03/17/2021 03/10/21   Montez Morita, Quillian Quince, MD  simvastatin (ZOCOR) 40 MG  tablet Take 40 mg by mouth at bedtime.     [provider]                                                                                                                                    Past Surgical History Past Surgical History:  Procedure Laterality Date   CIRCUMCISION     at age 63   COLONOSCOPY N/A 11/13/2015   Procedure: COLONOSCOPY;  Surgeon: Rogene Houston, MD;  [facility];  Service: Endoscopy;  Laterality: N/A;  2:10   COLONOSCOPY WITH PROPOFOL N/A 06/17/2020   Procedure: COLONOSCOPY WITH PROPOFOL;  Surgeon: Rogene Houston, MD;  [facility];  Service: Endoscopy;  Laterality: N/A;  AM / patient had positive covid 05/25/20   ESOPHAGEAL BANDING N/A 02/13/2021   Procedure: ESOPHAGEAL BANDING;  Surgeon: Harvel Quale, MD;  [facility];  Service: Gastroenterology;  Laterality: N/A;   ESOPHAGOGASTRODUODENOSCOPY N/A 06/03/2014   Procedure: ESOPHAGOGASTRODUODENOSCOPY (EGD);  Surgeon: Rogene Houston, MD;  [facility];  Service: Endoscopy;  Laterality: N/A;  730   ESOPHAGOGASTRODUODENOSCOPY N/A 10/26/2016   Procedure: ESOPHAGOGASTRODUODENOSCOPY (EGD);  Surgeon: Rogene Houston, MD;  [facility];  Service: Endoscopy;  Laterality: N/A;   ESOPHAGOGASTRODUODENOSCOPY (EGD) WITH PROPOFOL N/A 06/17/2020   Procedure: ESOPHAGOGASTRODUODENOSCOPY (EGD) WITH PROPOFOL;  Surgeon: Rogene Houston, MD;  [facility];  Service: Endoscopy;  Laterality: N/A;   ESOPHAGOGASTRODUODENOSCOPY (EGD) WITH PROPOFOL N/A 02/13/2021   Procedure: ESOPHAGOGASTRODUODENOSCOPY (EGD) WITH PROPOFOL;  Surgeon: Harvel Quale, MD;  [facility];  Service: Gastroenterology;  Laterality: N/A;   Gastric Ulcer  1993   GIVENS CAPSULE STUDY N/A 10/28/2016   Procedure: GIVENS CAPSULE STUDY;  Surgeon: Rogene Houston, MD;  [facility];  Service: Endoscopy;  Laterality: N/A;   HERNIA REPAIR     IR EMBO VENOUS NOT  HEMORR HEMANG  INC GUIDE ROADMAPPING  03/05/2021   IR INTRAVASCULAR ULTRASOUND NON CORONARY  03/05/2021   IR RADIOLOGIST EVAL & MGMT  02/26/2021   IR THROMBECT VENO MECH MOD SED  03/05/2021   IR TIPS  03/05/2021   IR US GUIDE VASC ACCESS RIGHT  03/05/2021   IR  US GUIDE VASC ACCESS RIGHT  03/05/2021   POLYPECTOMY  11/13/2015   Procedure: POLYPECTOMY;  Surgeon: Rogene Houston, MD;  [facility];  Service: Endoscopy;;  colon   POLYPECTOMY  06/17/2020   Procedure: POLYPECTOMY;  Surgeon: Rogene Houston, MD;  [facility];  Service: Endoscopy;;  sigmoid   RADIOLOGY WITH ANESTHESIA N/A 03/05/2021   Procedure: RADIOLOGY WITH ANESTHESIA    I.R. Rise Paganini;  Surgeon: Suzette Battiest, MD;  Location: [facility];  Service: Radiology;  Laterality: N/A;   Right Elbow Right    A pin was put in   Family History Family History  Problem Relation Age of Onset   Emphysema Mother    Cerebrovascular Accident Father    Heart disease Father    Diabetes Sister    Heart disease Brother    Heart disease Sister    Heart disease Brother    Diabetes Brother     Social History Social History   Tobacco Use   Smoking status: Former    Types: Cigarettes    Quit date: 05/25/1989    Years since quitting: 31.8   Smokeless tobacco: Former    Quit date: 01/16/1990  Vaping Use   Vaping Use: Never used  Substance Use Topics   Alcohol use: No    Alcohol/week: 0.0 standard drinks    Comment: Drank many years - 30 years ago   Drug use: No   Allergies Penicillins  Review of Systems Review of Systems  Cardiovascular:  Positive for palpitations.   Physical Exam Vital Signs  I have reviewed the triage vital signs BP 96/65    Pulse (!) 105    Temp 97.8 F (36.6 C) (Oral)    Resp 20    Ht 5\' 9"  (1.753 m)    Wt 80 kg    SpO2 98%    BMI 26.05 kg/m   Physical Exam Vitals and nursing note reviewed.  Constitutional:      General: He is not in acute distress.    Appearance: He is well-developed.   HENT:     Head: Normocephalic and atraumatic.  Eyes:     Conjunctiva/sclera: Conjunctivae normal.  Cardiovascular:     Rate and Rhythm: Tachycardia present. Rhythm irregular.     Heart sounds: No murmur heard. Pulmonary:     Effort: Pulmonary effort is normal. No respiratory distress.     Breath sounds: Normal breath sounds.  Abdominal:     Palpations: Abdomen is soft.     Tenderness: There is no abdominal tenderness.  Musculoskeletal:        General: No swelling.     Cervical back: Neck supple.  Skin:    General: Skin is warm and dry.     Capillary Refill: Capillary refill takes less than 2 seconds.  Neurological:     Mental Status: He is alert.  Psychiatric:        Mood and Affect: Mood normal.    ED Results and Treatments Labs (all labs ordered are listed, but only abnormal results are displayed) Labs Reviewed  CBC WITH DIFFERENTIAL/PLATELET - Abnormal; Notable for the following components:      Result Value   WBC 3.9 (*)    RBC 4.04 (*)    Hemoglobin 11.9 (*)    HCT 38.6 (*)    RDW 17.5 (*)    Platelets 89 (*)    All other components within normal limits  CBG MONITORING, ED - Abnormal; Notable for the  following components:   Glucose-Capillary 110 (*)    All other components within normal limits  I-STAT CHEM 8, ED  TROPONIN I (HIGH SENSITIVITY)  TROPONIN I (HIGH SENSITIVITY)                                                                                                                          Radiology No results found.  Pertinent labs & imaging results that were available during my care of the patient were reviewed by me and considered in my medical decision making (see MDM for details).  Medications Ordered in ED Medications  diltiazem (CARDIZEM) injection 10 mg (10 mg Intravenous Not Given 03/30/21 1211)  lactated ringers bolus 1,000 mL (0 mLs Intravenous Stopped 03/30/21 1207)  metoprolol tartrate (LOPRESSOR) injection 10 mg (10 mg Intravenous Given  03/30/21 1222)                                                                                                                                     Procedures .Critical Care Performed by: Teressa Lower, MD Authorized by: Teressa Lower, MD   Critical care provider statement:    Critical care time (minutes):  30   Critical care was time spent personally by me on the following activities:  Development of treatment plan with patient or surrogate, discussions with consultants, evaluation of patient's response to treatment, examination of patient, ordering and review of laboratory studies, ordering and review of radiographic studies, ordering and performing treatments and interventions, pulse oximetry, re-evaluation of patient's condition and review of old charts  (including critical care time)  Medical Decision Making / ED Course   This patient presents to the ED for concern of rapid heart rate, this involves an extensive number of treatment options, and is a complaint that carries with it a high risk of complications and morbidity.  The differential diagnosis includes atrial fibrillation, a flutter, dehydration  MDM: Patient seen emergency department for evaluation of rapid heart rate.  Physical exam is unremarkable outside of a irregular tachycardia.  Laboratory evaluation with a mild leukopenia to 3.9, hemoglobin 11.9, repeat glucose 110, thrombocytopenia to 89 likely decreased in setting of his advanced liver disease.  ECG with A. fib with RVR and I spoke with the cardiologist on-call who states that the patient has likely been in chronic A. fib and because he is off of his rate control medications he just  needs to be rate controlled here in the emergency department and can follow-up outpatient while restarting his 3.25 mg Coreg.  Patient was given 10 of Lopressor which appropriately rate controlled the patient.  He was then discharged with outpatient cardiology follow-up.   Additional  history obtained: -Additional history obtained from wife -External records from outside source obtained and reviewed including: Chart review including previous notes, labs, imaging, consultation notes   Lab Tests: -I ordered, reviewed, and interpreted labs.  The pertinent results include: White blood cell count 3.9, hemoglobin 11.9, glucose 110   EKG  EKG Interpretation  Date/Time:  Tuesday March 30 2021 09:53:11 EST Ventricular Rate:  138 PR Interval:    QRS Duration: 82 QT Interval:  291 QTC Calculation: 441 R Axis:   53 Text Interpretation: Atrial fibrillation with rvr Confirmed by Tramond Slinker ([id number]) on 03/30/2021 4:35:32 PM         Medicines ordered and prescription drug management: Meds ordered this encounter  Medications   lactated ringers bolus 1,000 mL   diltiazem (CARDIZEM) injection 10 mg   DISCONTD: diltiazem (CARDIZEM) 125 mg in dextrose 5% 125 mL (1 mg/mL) infusion   metoprolol tartrate (LOPRESSOR) injection 10 mg   carvedilol (COREG) 3.125 MG tablet    Sig: Take 1 tablet (3.125 mg total) by mouth 2 (two) times daily with a meal.    Dispense:  60 tablet    Refill:  0   -Reevaluation of the patient after these medicines showed that the patient improved -I have reviewed the patients home medicines and have made adjustments as needed  Critical interventions Lopressor for rate control  Consultations Obtained: I requested consultation with the cardiology,  and discussed lab and imaging findings as well as pertinent plan - they recommend: Rate control and discharge on home Coreg   Cardiac Monitoring: The patient was maintained on a cardiac monitor.  I personally viewed and interpreted the cardiac monitored which showed an underlying rhythm of: A. fib with RVR   Reevaluation: After the interventions noted above, I reevaluated the patient and found that they have :improved  Co morbidities that complicate the patient evaluation  Past Medical History:   Diagnosis Date   A-fib ([place])    Arthritis    Hands/Fingers   Asthma    Back pain    Cirrhosis of liver ([place])    Diabetes mellitus ([place])    Enlarged prostate    Gastric ulcer    GERD (gastroesophageal reflux disease)    H/O bladder problems    History of kidney stones    Hypercholesteremia    Hypertension    Prostate cancer ([place])    Thyroid disease    UGI bleed 10/26/2016   Vitamin B12 deficiency 10/26/2016      Dispostion: Discharge     Final Clinical Impression(s) / ED Diagnoses Final diagnoses:  Atrial fibrillation, unspecified type [facility])     @PCDICTATION @    Teressa Lower, MD 03/30/21 1636

## 2021-03-30 NOTE — Progress Notes (Signed)
Per the [facility], [facility], to discharge Tyler Farmer to home, in the computer, due to patient being transferred and put in [place] room 1 at 0945am and ED unable to transfer patient over and having to create a new chart for patient.

## 2021-03-30 NOTE — Interval H&P Note (Signed)
History and Physical Interval Note:  03/30/2021 9:15 AM  Tyler Farmer  has presented today for surgery, with the diagnosis of Esophageal Varices.  The various methods of treatment have been discussed with the patient and family. After consideration of risks, benefits and other options for treatment, the patient has consented to  Procedure(s) with comments: ESOPHAGOGASTRODUODENOSCOPY (EGD) WITH PROPOFOL (N/A) - 830 as a surgical intervention.  The patient's history has been reviewed, patient examined, no change in status, stable for surgery.  I have reviewed the patient's chart and labs.  Questions were answered to the patient's satisfaction.     Tyler Farmer

## 2021-03-30 NOTE — Anesthesia Preprocedure Evaluation (Deleted)
Anesthesia Evaluation  Patient identified by MRN, date of birth, ID band Patient awake    Reviewed: Allergy & Precautions, H&P , NPO status , Patient's Chart, lab work & pertinent test results, reviewed documented beta blocker date and time   Airway Mallampati: II  TM Distance: >3 FB Neck ROM: full    Dental no notable dental hx.    Pulmonary asthma , COPD, former smoker,    Pulmonary exam normal breath sounds clear to auscultation       Cardiovascular Exercise Tolerance: Good hypertension, negative cardio ROS   Rhythm:regular Rate:Normal     Neuro/Psych negative neurological ROS  negative psych ROS   GI/Hepatic PUD, Bowel prep,GERD  Medicated,(+) Cirrhosis   Esophageal Varices    ,   Endo/Other  negative endocrine ROSdiabetes  Renal/GU negative Renal ROS  negative genitourinary   Musculoskeletal   Abdominal   Peds  Hematology  (+) Blood dyscrasia, anemia ,   Anesthesia Other Findings   Reproductive/Obstetrics negative OB ROS                             Anesthesia Physical Anesthesia Plan  ASA: 3  Anesthesia Plan: General   Post-op Pain Management:    Induction:   PONV Risk Score and Plan: Propofol infusion  Airway Management Planned:   Additional Equipment:   Intra-op Plan:   Post-operative Plan:   Informed Consent: I have reviewed the patients History and Physical, chart, labs and discussed the procedure including the risks, benefits and alternatives for the proposed anesthesia with the patient or authorized representative who has indicated his/her understanding and acceptance.     Dental Advisory Given  Plan Discussed with: CRNA  Anesthesia Plan Comments:         Anesthesia Quick Evaluation

## 2021-03-30 NOTE — Progress Notes (Signed)
Made Dr.Kiel aware of patients tachycardia, with heart rate of 120-150s and a-fib rhythm. EKG 12 lead ordered and performed with impression showing atrial fibrillation with RVR, Dr.Kiel made aware. Patient assessed by Briant Cedar, who spoke with patients wife and recommended for patient to be seen in ER. Called and gave report to [facility], charge nurse in ED today, who provided room number 1 in ER for transfer. Patient transported by stretcher to ER Room 1 and gave bedside report to [facility]

## 2021-03-30 NOTE — Progress Notes (Signed)
Patient found to have A. fib with RVR in the preop area, case was discussed with anesthesiology today, we will take the patient to the ER for further management and the procedure will be canceled.  Tyler Peppers, MD Gastroenterology and Hepatology [facility]

## 2021-03-30 NOTE — Progress Notes (Signed)
Hypoglycemic Event  CBG: 52  Treatment: D50 25 mL (12.5 gm)  Symptoms: None  Follow-up CBG: [id number] CBG Result:98  Possible Reasons for Event: Inadequate meal intake  Comments/MD notified:Brendan Northbrook

## 2021-03-30 NOTE — ED Triage Notes (Signed)
Pt arrived from endoscopy with complaints of AFIB with RVR and hypoglycemia. Endo nurse reports CBG 52. Endo nurse gave 0.5 amp of D50. CBG 98 follow administration.

## 2021-03-31 ENCOUNTER — Telehealth: Payer: Self-pay | Admitting: Cardiology

## 2021-03-31 NOTE — Telephone Encounter (Signed)
Please schedule ER f/u with PA or NP in [place] office in the next few weeks.

## 2021-03-31 NOTE — Telephone Encounter (Signed)
Patient's spouse call he was in the emergency room yesterday for A-Fib, they want him to follow up with [place].  The first available I found was April. She would like for him to be seen sooner than that.

## 2021-04-05 DIAGNOSIS — E039 Hypothyroidism, unspecified: Secondary | ICD-10-CM | POA: Diagnosis not present

## 2021-04-05 DIAGNOSIS — I10 Essential (primary) hypertension: Secondary | ICD-10-CM | POA: Diagnosis not present

## 2021-04-05 DIAGNOSIS — I129 Hypertensive chronic kidney disease with stage 1 through stage 4 chronic kidney disease, or unspecified chronic kidney disease: Secondary | ICD-10-CM | POA: Diagnosis not present

## 2021-04-05 DIAGNOSIS — E1129 Type 2 diabetes mellitus with other diabetic kidney complication: Secondary | ICD-10-CM | POA: Diagnosis not present

## 2021-04-05 DIAGNOSIS — Z1329 Encounter for screening for other suspected endocrine disorder: Secondary | ICD-10-CM | POA: Diagnosis not present

## 2021-04-05 DIAGNOSIS — E11628 Type 2 diabetes mellitus with other skin complications: Secondary | ICD-10-CM | POA: Diagnosis not present

## 2021-04-05 DIAGNOSIS — E7800 Pure hypercholesterolemia, unspecified: Secondary | ICD-10-CM | POA: Diagnosis not present

## 2021-04-05 DIAGNOSIS — E1165 Type 2 diabetes mellitus with hyperglycemia: Secondary | ICD-10-CM | POA: Diagnosis not present

## 2021-04-05 NOTE — Telephone Encounter (Signed)
Per Patient wife they received the medication on 04/03/2021. # [phone] .

## 2021-04-08 ENCOUNTER — Ambulatory Visit (INDEPENDENT_AMBULATORY_CARE_PROVIDER_SITE_OTHER): Payer: Medicare Other | Admitting: Gastroenterology

## 2021-04-08 ENCOUNTER — Other Ambulatory Visit: Payer: Self-pay

## 2021-04-08 ENCOUNTER — Encounter (INDEPENDENT_AMBULATORY_CARE_PROVIDER_SITE_OTHER): Payer: Self-pay | Admitting: Gastroenterology

## 2021-04-08 VITALS — BP 114/67 | HR 134 | Temp 98.2°F | Ht 69.0 in | Wt 196.8 lb

## 2021-04-08 DIAGNOSIS — K7460 Unspecified cirrhosis of liver: Secondary | ICD-10-CM

## 2021-04-08 DIAGNOSIS — K7581 Nonalcoholic steatohepatitis (NASH): Secondary | ICD-10-CM

## 2021-04-08 DIAGNOSIS — K254 Chronic or unspecified gastric ulcer with hemorrhage: Secondary | ICD-10-CM

## 2021-04-08 NOTE — Progress Notes (Signed)
Referring Provider: [name], MD Primary Care Physician:  [name], MD Primary GI Physician: castaneda  Chief Complaint  Patient presents with   Follow-up    1 month follow up on cirrhosis. Patients weight today 196.8 lb. Last weight in system one week ago was 176. Pt's wife Inez Catalina states Dr. Milagros Reap stopped lasix.    HPI:   Tyler Farmer is a 81 y.o. male with past medical history of NASH cirrhosis complicated by nonbleeding grade 2 esophageal varices s/p banding, ascites, hx of bleeding gastric AVM, PUD s/p Billroth II gastrectomy, atrial fib, DM, GERD, HLD, HTN, prostate cancer, and acute portal vein thrombosis s/p TIPS, partial thrombectomy and coil embolization of splenorenal shunt.   Patient presenting today for follow up prior to rescheduling EGD for re evaluation of gastric ulcer.  Patient last seen in office 03/08/21 with plans for EGD on 03/30/21 to evaluate for healing of previously noted gastric ulcer, lasix and spironolactone were stopped at that time as patient is s/p TIPS and was felt to be overall well compensated. On presentation for EGD on 1/10, patient was found to have A fib with RVR. EGD was postponed and patient taken to the ED. He was given medication in the ER with resolution of RVR, did not undergo cardioversion at that time. Was discharged and advised to follow up with cardiology, has appt with cards on 04/27/21.   Currently on lactulose 30g BID and xifaxan 550 BID, 2-3 BMs per day that are looser in consistency. Patient and wife deny any episodes of confusion, though patient is forgetful at times, he has had no disorientation.  Currently maintained on carvedilol 3.125mg  daily as well as lisinopril 5mg  daily, HR today is 134 today, rechecked via radial pulse by this Provider with HR 100 but irregular. Patient has gained approx 20 pounds since last visit in December. Pt reports that he has been more short of breath recently though denies any swelling to  abdomen or lower extremities. He has had no shortness of breath, though has not noticed any palpitations or felt tachycardic.  He is not currently on any diuretic therapy. Has occasional cough. Denies fatigue or dizziness.  He denies any jaundice pruritus, rectal bleeding, melena, abdominal pain, nausea, vomiting, or dizziness. Appetite is good.   Cirrhosis related questions: Hematemesis/coffee ground emesis: no History of variceal bleeding: no Abdominal pain: no Abdominal distention/worsening ascites: yes Fever/chills: no Episodes of confusion/disorientation: no confusion, somewhat forgetful but this is not new Taking diuretics?:no Prior history of banding?: no Prior episodes of SBP: no Last liver imaging: MRCP 02/12/21  with acute near occlusive thrombosis of main portal vein extending to proximal splenic vein, portosplenic venous confluence and adjacent SMV, cirrhosis, mild splenomegaly, no ascites, mild diffuse gallbladder wall thickening is unchanged and favored to be due to pericholecystic varices. Gen mild pancreatic and peripancreatic edema, most prominent in pancreatic head, neck, non specific, probably secondary to the acute portal vein thrombosis, although acute, non necrotizing pancreatitis differential. No peripancreatic fluid collections.   Last Colonoscopy:3/30/22congestion in the cecum, diverticulosis in the sigmoid colon, 3 mm polyp in the sigmoid colon, external hemorrhoids.  Polyp was resected but not retrieved.  Last Endoscopy:11/26/22Grade III varices were found in the lower third of the esophagus. Three bands were successfully placed with complete eradication, resulting in deflation of varices. One oozing superficial gastric ulcer with oozing hemorrhage (Forrest Class Ib) was found in the gastric body. The lesion was 4 mm in largest dimension. For hemostasis,  two hemostatic clips were successfully placed.   Evidence of a patent Billroth II gastrojejunostomy was found. The  gastrojejunal anastomosis was characterized by healthy appearing mucosa. This was traversed. The efferent limb was examined. The afferent limb was examined.   Past Medical History:  Diagnosis Date   A-fib ([place])    Arthritis    Hands/Fingers   Asthma    Back pain    Cirrhosis of liver (HCC)    Diabetes mellitus ([place])    Enlarged prostate    Gastric ulcer    GERD (gastroesophageal reflux disease)    H/O bladder problems    History of kidney stones    Hypercholesteremia    Hypertension    Prostate cancer ([place])    Thyroid disease    UGI bleed 10/26/2016   Vitamin B12 deficiency 10/26/2016    Past Surgical History:  Procedure Laterality Date   CIRCUMCISION     at age 6   COLONOSCOPY N/A 11/13/2015   Procedure: COLONOSCOPY;  Surgeon: Rogene Houston, MD;  [facility];  Service: Endoscopy;  Laterality: N/A;  2:10   COLONOSCOPY WITH PROPOFOL N/A 06/17/2020   Procedure: COLONOSCOPY WITH PROPOFOL;  Surgeon: Rogene Houston, MD;  [facility];  Service: Endoscopy;  Laterality: N/A;  AM / patient had positive covid 05/25/20   ESOPHAGEAL BANDING N/A 02/13/2021   Procedure: ESOPHAGEAL BANDING;  Surgeon: Harvel Quale, MD;  [facility];  Service: Gastroenterology;  Laterality: N/A;   ESOPHAGOGASTRODUODENOSCOPY N/A 06/03/2014   Procedure: ESOPHAGOGASTRODUODENOSCOPY (EGD);  Surgeon: Rogene Houston, MD;  [facility];  Service: Endoscopy;  Laterality: N/A;  730   ESOPHAGOGASTRODUODENOSCOPY N/A 10/26/2016   Procedure: ESOPHAGOGASTRODUODENOSCOPY (EGD);  Surgeon: Rogene Houston, MD;  [facility];  Service: Endoscopy;  Laterality: N/A;   ESOPHAGOGASTRODUODENOSCOPY (EGD) WITH PROPOFOL N/A 06/17/2020   Procedure: ESOPHAGOGASTRODUODENOSCOPY (EGD) WITH PROPOFOL;  Surgeon: Rogene Houston, MD;  [facility];  Service: Endoscopy;  Laterality: N/A;   ESOPHAGOGASTRODUODENOSCOPY (EGD) WITH PROPOFOL N/A 02/13/2021   Procedure:  ESOPHAGOGASTRODUODENOSCOPY (EGD) WITH PROPOFOL;  Surgeon: Harvel Quale, MD;  [facility];  Service: Gastroenterology;  Laterality: N/A;   Gastric Ulcer  1993   GIVENS CAPSULE STUDY N/A 10/28/2016   Procedure: GIVENS CAPSULE STUDY;  Surgeon: Rogene Houston, MD;  [facility];  Service: Endoscopy;  Laterality: N/A;   HERNIA REPAIR     IR EMBO VENOUS NOT HEMORR HEMANG  INC GUIDE ROADMAPPING  03/05/2021   IR INTRAVASCULAR ULTRASOUND NON CORONARY  03/05/2021   IR RADIOLOGIST EVAL & MGMT  02/26/2021   IR THROMBECT VENO MECH MOD SED  03/05/2021   IR TIPS  03/05/2021   IR US GUIDE VASC ACCESS RIGHT  03/05/2021   IR US GUIDE VASC ACCESS RIGHT  03/05/2021   POLYPECTOMY  11/13/2015   Procedure: POLYPECTOMY;  Surgeon: Rogene Houston, MD;  [facility];  Service: Endoscopy;;  colon   POLYPECTOMY  06/17/2020   Procedure: POLYPECTOMY;  Surgeon: Rogene Houston, MD;  [facility];  Service: Endoscopy;;  sigmoid   RADIOLOGY WITH ANESTHESIA N/A 03/05/2021   Procedure: RADIOLOGY WITH ANESTHESIA    I.R. Rise Paganini;  Surgeon: Suzette Battiest, MD;  Location: [place];  Service: Radiology;  Laterality: N/A;   Right Elbow Right    A pin was put in    Current Outpatient Medications  Medication Sig Dispense Refill   acetaminophen (TYLENOL) 500  MG tablet Take 500 mg by mouth every 6 (six) hours as needed.     aspirin EC 81 MG tablet Take 1 tablet (81 mg total) by mouth daily with breakfast. Swallow whole. 30 tablet 11   carvedilol (COREG) 3.125 MG tablet Take 1 tablet (3.125 mg total) by mouth 2 (two) times daily with a meal. 60 tablet 0   Cranberry 500 MG TABS Take 500 mg by mouth daily.     dicyclomine (BENTYL) 10 MG capsule Take 10 mg by mouth 2 (two) times daily as needed for spasms.     ferrous sulfate 325 (65 FE) MG tablet Take 325 mg by mouth daily with breakfast.     glipiZIDE (GLUCOTROL) 10 MG tablet Take 10 mg by mouth 2 (two) times daily before a meal.      lactulose (CHRONULAC) 10 GM/15ML solution Take 45 mLs (30 g total) by mouth 2 (two) times daily. 5000 mL 3   levothyroxine (SYNTHROID) 137 MCG tablet Take 137 mcg by mouth daily before breakfast.     lisinopril (ZESTRIL) 5 MG tablet Take 5 mg by mouth daily.     magnesium oxide (MAG-OX) 400 MG tablet Take 400 mg by mouth daily.     ondansetron (ZOFRAN) 4 MG tablet Take 1 tablet (4 mg total) by mouth every 8 (eight) hours as needed for nausea or vomiting. 30 tablet 1   pantoprazole (PROTONIX) 40 MG tablet Take 1 tablet (40 mg total) by mouth 2 (two) times daily. 60 tablet 2   simvastatin (ZOCOR) 40 MG tablet Take 40 mg by mouth at bedtime.      No current facility-administered medications for this visit.    Allergies as of 04/08/2021 - Review Complete 04/08/2021  Allergen Reaction Noted   Penicillins Nausea And Vomiting 05/26/2014    Family History  Problem Relation Age of Onset   Emphysema Mother    Cerebrovascular Accident Father    Heart disease Father    Diabetes Sister    Heart disease Brother    Heart disease Sister    Heart disease Brother    Diabetes Brother     Social History   Socioeconomic History   Marital status: Married    Spouse name: Not on file   Number of children: Not on file   Years of education: Not on file   Highest education level: Not on file  Occupational History   Not on file  Tobacco Use   Smoking status: Former    Types: Cigarettes    Quit date: 05/25/1989    Years since quitting: 31.8   Smokeless tobacco: Former    Quit date: 01/16/1990  Vaping Use   Vaping Use: Never used  Substance and Sexual Activity   Alcohol use: No    Alcohol/week: 0.0 standard drinks    Comment: Drank many years - 30 years ago   Drug use: No   Sexual activity: Not on file  Other Topics Concern   Not on file  Social History Narrative   Not on file   Social Determinants of Health   Financial Resource Strain: Not on file  Food Insecurity: Not on file   Transportation Needs: Not on file  Physical Activity: Not on file  Stress: Not on file  Social Connections: Not on file   Review of systems General: negative for malaise, night sweats, fever, chills, weight loss +weight gain Neck: Negative for lumps, goiter, pain and significant neck swelling Resp: Negative for wheezing, dyspnea at rest +  dyspnea with exertion +cough CV: Negative for chest pain, leg swelling, palpitations, orthopnea GI: denies melena, hematochezia, nausea, vomiting, diarrhea, constipation, dysphagia, odyonophagia, early satiety or unintentional weight loss. Denies confusion, jaundice, pruritus, edema to legs or abdomen MSK: Negative for joint pain or swelling, back pain, and muscle pain. Derm: Negative for itching or rash Psych: Denies depression, anxiety, memory loss, confusion. No homicidal or suicidal ideation.  Heme: Negative for prolonged bleeding, bruising easily, and swollen nodes. Endocrine: Negative for cold or heat intolerance, polyuria, polydipsia and goiter. Neuro: negative for tremor, gait imbalance, syncope and seizures. The remainder of the review of systems is noncontributory.  Physical Exam: BP 114/67 (BP Location: Left Arm, Patient Position: Sitting, Cuff Size: Normal)    Pulse (!) 134    Temp 98.2 F (36.8 C) (Oral)    Ht 5\' 9"  (1.753 m)    Wt 196 lb 12.8 oz (89.3 kg)    BMI 29.06 kg/m  General:   Alert and oriented. No distress noted. Pleasant and cooperative.  Head:  Normocephalic and atraumatic. Eyes:  Conjuctiva clear without scleral icterus. Mouth:  Oral mucosa pink and moist. Good dentition. No lesions. Heart: irregular rhythm, tachycardic Lungs: Clear lung sounds in all lobes. Respirations equal and unlabored. Abdomen:  +BS, soft, non-tender and non-distended. No rebound or guarding. No HSM or masses noted. Derm: No palmar erythema or jaundice Msk:  Symmetrical without gross deformities. Normal posture. Extremities:  Without  edema. Neurologic:  Alert and  oriented x4 Psych:  Alert and cooperative. Normal mood and affect.  Invalid input(s): 6 MONTHS   ASSESSMENT: Tyler Farmer is a 81 y.o. male presenting today for follow up in preparation of EGD for re-evaluation of previously discovered bleeding gastric ulcer in Nov 2022.  EGD 1/10 postponed due to presence of Afib RVR on presentation to pre-op. HR remains elevated today 134 initially on dinamap and 100 and irregular on manual recheck of radial pulse by this provider. Pt endorses somewhat more sob than normal, though not noted to be in any distress at rest. No overt evidence of fluid overload as he is without ascites or lower extremity edema. Endorses mild cough though lungs without adventitious breath sounds on exam.  notably he has gained approx 20 pounds over the past few weeks. Diuretics were stopped at last GI visit as patient has had TIPS procedure and was doing well without evidence of fluid retention, though this amount of weight gain in such a short period of time is concerning, however, I suspect this is related more to his cardiac status than cirrhosis given his presentation. Reassuringly he is not in any acute distress during visit, though I encouraged patient and his wife to see if they could be seen sooner by cardiology for further evaluation of continued a fib/sob and weight gain. We will have to hold off on proceeding with repeat EGD until patient has been cleared by cardiology considering he remains in a fib and with worsening SOB. Patient given strict instruction to proceed to ED if sob worsens, he has swelling to abdomen or legs, chest pain, feels that his heart is racing, becomes dizzy or lightheaded or develops any new/concerning symptoms.   Patient denies melena, hematochezia, nausea, vomiting, diarrhea, constipation, dysphagia, odyonophagia, early satiety or weight loss.    PLAN:  Hold off on repeating EGD until cleared by cards 2. Continue  PPI BID 3. Continue lactulose and xifaxan at current doses 4. May need to reconsider diuretic use, though defer to cardiology  as no ascites present 5. Continue low sodium diet, <2g 6.Can take Tylenol max of 2 g per day (650 mg q8h) for pain 7. avoid NSAIDs for pain 8. Avoid eating raw oysters/shellfish 9. Ensure every night before going to sleep   Follow Up: Has follow up 04/22/21  Meoshia Billing L. Alver Sorrow, MSN, APRN, AGNP-C Adult-Gerontology Nurse Practitioner [facility]

## 2021-04-08 NOTE — Patient Instructions (Signed)
We will check some basic labs today. Please contact cardiology to see if they can see you any sooner. If your shortness of breath worsens, you have swelling in your abdomen or legs, you feel lightheaded or dizzy, please proceed to the ER for further evaluation as I am concerned that you are still in A fib and HR is elevated today.  We will need to hold off on proceeding with EGD until cardiology has seen you and feels you are stable from cardiac standpoint.

## 2021-04-09 LAB — COMPREHENSIVE METABOLIC PANEL
AG Ratio: 0.9 (calc) — ABNORMAL LOW (ref 1.0–2.5)
ALT: 18 U/L (ref 9–46)
AST: 24 U/L (ref 10–35)
Albumin: 2.9 g/dL — ABNORMAL LOW (ref 3.6–5.1)
Alkaline phosphatase (APISO): 146 U/L — ABNORMAL HIGH (ref 35–144)
BUN/Creatinine Ratio: 10 (calc) (ref 6–22)
BUN: 15 mg/dL (ref 7–25)
CO2: 26 mmol/L (ref 20–32)
Calcium: 8.5 mg/dL — ABNORMAL LOW (ref 8.6–10.3)
Chloride: 109 mmol/L (ref 98–110)
Creat: 1.43 mg/dL — ABNORMAL HIGH (ref 0.70–1.22)
Globulin: 3.3 g/dL (calc) (ref 1.9–3.7)
Glucose, Bld: 164 mg/dL — ABNORMAL HIGH (ref 65–139)
Potassium: 4.4 mmol/L (ref 3.5–5.3)
Sodium: 140 mmol/L (ref 135–146)
Total Bilirubin: 2.1 mg/dL — ABNORMAL HIGH (ref 0.2–1.2)
Total Protein: 6.2 g/dL (ref 6.1–8.1)

## 2021-04-12 ENCOUNTER — Telehealth (INDEPENDENT_AMBULATORY_CARE_PROVIDER_SITE_OTHER): Payer: Self-pay

## 2021-04-12 ENCOUNTER — Emergency Department (HOSPITAL_COMMUNITY): Payer: Medicare Other

## 2021-04-12 ENCOUNTER — Encounter (HOSPITAL_COMMUNITY): Payer: Self-pay | Admitting: *Deleted

## 2021-04-12 ENCOUNTER — Other Ambulatory Visit: Payer: Self-pay

## 2021-04-12 ENCOUNTER — Inpatient Hospital Stay (HOSPITAL_COMMUNITY)
Admission: EM | Admit: 2021-04-12 | Discharge: 2021-04-14 | DRG: 291 | Disposition: A | Discharge status: Home / Self Care | Payer: Medicare Other | Attending: Family Medicine | Admitting: Family Medicine

## 2021-04-12 DIAGNOSIS — Z8711 Personal history of peptic ulcer disease: Secondary | ICD-10-CM

## 2021-04-12 DIAGNOSIS — E039 Hypothyroidism, unspecified: Secondary | ICD-10-CM | POA: Diagnosis present

## 2021-04-12 DIAGNOSIS — I5033 Acute on chronic diastolic (congestive) heart failure: Secondary | ICD-10-CM | POA: Diagnosis not present

## 2021-04-12 DIAGNOSIS — I5031 Acute diastolic (congestive) heart failure: Secondary | ICD-10-CM | POA: Diagnosis not present

## 2021-04-12 DIAGNOSIS — Z87891 Personal history of nicotine dependence: Secondary | ICD-10-CM | POA: Diagnosis not present

## 2021-04-12 DIAGNOSIS — Z20822 Contact with and (suspected) exposure to covid-19: Secondary | ICD-10-CM | POA: Diagnosis present

## 2021-04-12 DIAGNOSIS — E1122 Type 2 diabetes mellitus with diabetic chronic kidney disease: Secondary | ICD-10-CM | POA: Diagnosis not present

## 2021-04-12 DIAGNOSIS — Z8249 Family history of ischemic heart disease and other diseases of the circulatory system: Secondary | ICD-10-CM

## 2021-04-12 DIAGNOSIS — I4819 Other persistent atrial fibrillation: Secondary | ICD-10-CM | POA: Diagnosis not present

## 2021-04-12 DIAGNOSIS — Z833 Family history of diabetes mellitus: Secondary | ICD-10-CM

## 2021-04-12 DIAGNOSIS — E7800 Pure hypercholesterolemia, unspecified: Secondary | ICD-10-CM | POA: Diagnosis not present

## 2021-04-12 DIAGNOSIS — I517 Cardiomegaly: Secondary | ICD-10-CM | POA: Diagnosis not present

## 2021-04-12 DIAGNOSIS — I130 Hypertensive heart and chronic kidney disease with heart failure and stage 1 through stage 4 chronic kidney disease, or unspecified chronic kidney disease: Secondary | ICD-10-CM | POA: Diagnosis not present

## 2021-04-12 DIAGNOSIS — Z8546 Personal history of malignant neoplasm of prostate: Secondary | ICD-10-CM

## 2021-04-12 DIAGNOSIS — K7682 Hepatic encephalopathy: Secondary | ICD-10-CM | POA: Diagnosis not present

## 2021-04-12 DIAGNOSIS — Z7982 Long term (current) use of aspirin: Secondary | ICD-10-CM

## 2021-04-12 DIAGNOSIS — I8510 Secondary esophageal varices without bleeding: Secondary | ICD-10-CM | POA: Diagnosis present

## 2021-04-12 DIAGNOSIS — N400 Enlarged prostate without lower urinary tract symptoms: Secondary | ICD-10-CM | POA: Diagnosis present

## 2021-04-12 DIAGNOSIS — I509 Heart failure, unspecified: Secondary | ICD-10-CM

## 2021-04-12 DIAGNOSIS — E119 Type 2 diabetes mellitus without complications: Secondary | ICD-10-CM

## 2021-04-12 DIAGNOSIS — K7581 Nonalcoholic steatohepatitis (NASH): Secondary | ICD-10-CM | POA: Diagnosis present

## 2021-04-12 DIAGNOSIS — Z7984 Long term (current) use of oral hypoglycemic drugs: Secondary | ICD-10-CM

## 2021-04-12 DIAGNOSIS — I10 Essential (primary) hypertension: Secondary | ICD-10-CM | POA: Diagnosis not present

## 2021-04-12 DIAGNOSIS — E876 Hypokalemia: Secondary | ICD-10-CM | POA: Diagnosis not present

## 2021-04-12 DIAGNOSIS — J9811 Atelectasis: Secondary | ICD-10-CM | POA: Diagnosis not present

## 2021-04-12 DIAGNOSIS — Z880 Allergy status to penicillin: Secondary | ICD-10-CM | POA: Diagnosis not present

## 2021-04-12 DIAGNOSIS — J90 Pleural effusion, not elsewhere classified: Secondary | ICD-10-CM | POA: Diagnosis not present

## 2021-04-12 DIAGNOSIS — K7469 Other cirrhosis of liver: Secondary | ICD-10-CM

## 2021-04-12 DIAGNOSIS — J439 Emphysema, unspecified: Secondary | ICD-10-CM | POA: Diagnosis not present

## 2021-04-12 DIAGNOSIS — I080 Rheumatic disorders of both mitral and aortic valves: Secondary | ICD-10-CM | POA: Diagnosis present

## 2021-04-12 DIAGNOSIS — E785 Hyperlipidemia, unspecified: Secondary | ICD-10-CM | POA: Diagnosis not present

## 2021-04-12 DIAGNOSIS — R0602 Shortness of breath: Secondary | ICD-10-CM

## 2021-04-12 DIAGNOSIS — E1169 Type 2 diabetes mellitus with other specified complication: Secondary | ICD-10-CM

## 2021-04-12 DIAGNOSIS — J449 Chronic obstructive pulmonary disease, unspecified: Secondary | ICD-10-CM | POA: Diagnosis present

## 2021-04-12 DIAGNOSIS — Z825 Family history of asthma and other chronic lower respiratory diseases: Secondary | ICD-10-CM

## 2021-04-12 DIAGNOSIS — Z79899 Other long term (current) drug therapy: Secondary | ICD-10-CM

## 2021-04-12 DIAGNOSIS — K219 Gastro-esophageal reflux disease without esophagitis: Secondary | ICD-10-CM | POA: Diagnosis not present

## 2021-04-12 DIAGNOSIS — K7460 Unspecified cirrhosis of liver: Secondary | ICD-10-CM | POA: Diagnosis present

## 2021-04-12 DIAGNOSIS — I480 Paroxysmal atrial fibrillation: Secondary | ICD-10-CM | POA: Diagnosis not present

## 2021-04-12 DIAGNOSIS — Z79890 Hormone replacement therapy (postmenopausal): Secondary | ICD-10-CM

## 2021-04-12 DIAGNOSIS — I700 Atherosclerosis of aorta: Secondary | ICD-10-CM | POA: Diagnosis not present

## 2021-04-12 DIAGNOSIS — N183 Chronic kidney disease, stage 3 unspecified: Secondary | ICD-10-CM | POA: Diagnosis present

## 2021-04-12 DIAGNOSIS — R079 Chest pain, unspecified: Secondary | ICD-10-CM | POA: Diagnosis not present

## 2021-04-12 DIAGNOSIS — I4891 Unspecified atrial fibrillation: Secondary | ICD-10-CM | POA: Diagnosis not present

## 2021-04-12 LAB — COMPREHENSIVE METABOLIC PANEL
ALT: 19 U/L (ref 0–44)
AST: 26 U/L (ref 15–41)
Albumin: 2.7 g/dL — ABNORMAL LOW (ref 3.5–5.0)
Alkaline Phosphatase: 126 U/L (ref 38–126)
Anion gap: 10 (ref 5–15)
BUN: 15 mg/dL (ref 8–23)
CO2: 22 mmol/L (ref 22–32)
Calcium: 8.7 mg/dL — ABNORMAL LOW (ref 8.9–10.3)
Chloride: 104 mmol/L (ref 98–111)
Creatinine, Ser: 1.34 mg/dL — ABNORMAL HIGH (ref 0.61–1.24)
GFR, Estimated: 54 mL/min — ABNORMAL LOW (ref >60)
Glucose, Bld: 163 mg/dL — ABNORMAL HIGH (ref 70–99)
Potassium: 4 mmol/L (ref 3.5–5.1)
Sodium: 136 mmol/L (ref 135–145)
Total Bilirubin: 2.8 mg/dL — ABNORMAL HIGH (ref 0.3–1.2)
Total Protein: 6.3 g/dL — ABNORMAL LOW (ref 6.5–8.1)

## 2021-04-12 LAB — TROPONIN I (HIGH SENSITIVITY)
Troponin I (High Sensitivity): 11 ng/L (ref <18)
Troponin I (High Sensitivity): 12 ng/L (ref <18)

## 2021-04-12 LAB — BRAIN NATRIURETIC PEPTIDE: B Natriuretic Peptide: 421 pg/mL — ABNORMAL HIGH (ref 0.0–100.0)

## 2021-04-12 LAB — CBC WITH DIFFERENTIAL/PLATELET
Abs Immature Granulocytes: 0.01 10*3/uL (ref 0.00–0.07)
Basophils Absolute: 0.1 10*3/uL (ref 0.0–0.1)
Basophils Relative: 1 %
Eosinophils Absolute: 0.4 10*3/uL (ref 0.0–0.5)
Eosinophils Relative: 9 %
HCT: 33.5 % — ABNORMAL LOW (ref 39.0–52.0)
Hemoglobin: 10.8 g/dL — ABNORMAL LOW (ref 13.0–17.0)
Immature Granulocytes: 0 %
Lymphocytes Relative: 16 %
Lymphs Abs: 0.8 10*3/uL (ref 0.7–4.0)
MCH: 31.3 pg (ref 26.0–34.0)
MCHC: 32.2 g/dL (ref 30.0–36.0)
MCV: 97.1 fL (ref 80.0–100.0)
Monocytes Absolute: 0.5 10*3/uL (ref 0.1–1.0)
Monocytes Relative: 11 %
Neutro Abs: 3 10*3/uL (ref 1.7–7.7)
Neutrophils Relative %: 63 %
Platelets: 121 10*3/uL — ABNORMAL LOW (ref 150–400)
RBC: 3.45 MIL/uL — ABNORMAL LOW (ref 4.22–5.81)
RDW: 19.4 % — ABNORMAL HIGH (ref 11.5–15.5)
WBC: 4.8 10*3/uL (ref 4.0–10.5)
nRBC: 0 % (ref 0.0–0.2)

## 2021-04-12 LAB — RESP PANEL BY RT-PCR (FLU A&B, COVID) ARPGX2
Influenza A by PCR: NEGATIVE
Influenza B by PCR: NEGATIVE
SARS Coronavirus 2 by RT PCR: NEGATIVE

## 2021-04-12 LAB — GLUCOSE, CAPILLARY: Glucose-Capillary: 175 mg/dL — ABNORMAL HIGH (ref 70–99)

## 2021-04-12 MED ORDER — LEVOTHYROXINE SODIUM 137 MCG PO TABS
137.0000 ug | ORAL_TABLET | Freq: Every day | ORAL | Status: DC
  Administered 2021-04-13 – 2021-04-14 (×2): 137 ug via ORAL
  Filled 2021-04-12 (×2): qty 1

## 2021-04-12 MED ORDER — ACETAMINOPHEN 650 MG RE SUPP: 650.0000 mg | Freq: Four times a day (QID) | RECTAL | Status: DC | PRN

## 2021-04-12 MED ORDER — LACTULOSE 10 GM/15ML PO SOLN
30.0000 g | Freq: Two times a day (BID) | ORAL | Status: DC
  Administered 2021-04-12 – 2021-04-14 (×4): 30 g via ORAL
  Filled 2021-04-12 (×4): qty 60

## 2021-04-12 MED ORDER — ONDANSETRON HCL 4 MG/2ML IJ SOLN: 4.0000 mg | Freq: Four times a day (QID) | INTRAMUSCULAR | Status: DC | PRN

## 2021-04-12 MED ORDER — FUROSEMIDE 10 MG/ML IJ SOLN
40.0000 mg | Freq: Two times a day (BID) | INTRAMUSCULAR | Status: DC
  Administered 2021-04-13 – 2021-04-14 (×3): 40 mg via INTRAVENOUS
  Filled 2021-04-12 (×3): qty 4

## 2021-04-12 MED ORDER — ONDANSETRON HCL 4 MG PO TABS: 4.0000 mg | ORAL_TABLET | Freq: Four times a day (QID) | ORAL | Status: DC | PRN

## 2021-04-12 MED ORDER — METOPROLOL TARTRATE 5 MG/5ML IV SOLN
2.5000 mg | Freq: Once | INTRAVENOUS | Status: AC
  Administered 2021-04-12: 2.5 mg via INTRAVENOUS
  Filled 2021-04-12: qty 5

## 2021-04-12 MED ORDER — IOHEXOL 350 MG/ML SOLN
80.0000 mL | Freq: Once | INTRAVENOUS | Status: AC | PRN
  Administered 2021-04-12: 80 mL via INTRAVENOUS

## 2021-04-12 MED ORDER — ENOXAPARIN SODIUM 40 MG/0.4ML IJ SOSY
40.0000 mg | PREFILLED_SYRINGE | INTRAMUSCULAR | Status: DC
  Administered 2021-04-12 – 2021-04-13 (×2): 40 mg via SUBCUTANEOUS
  Filled 2021-04-12 (×2): qty 0.4

## 2021-04-12 MED ORDER — FUROSEMIDE 10 MG/ML IJ SOLN
20.0000 mg | Freq: Once | INTRAMUSCULAR | Status: AC
  Administered 2021-04-12: 20 mg via INTRAVENOUS
  Filled 2021-04-12: qty 2

## 2021-04-12 MED ORDER — INSULIN ASPART 100 UNIT/ML IJ SOLN
0.0000 [IU] | Freq: Three times a day (TID) | INTRAMUSCULAR | Status: DC
  Administered 2021-04-13 (×2): 1 [IU] via SUBCUTANEOUS
  Administered 2021-04-13: 17:00:00 2 [IU] via SUBCUTANEOUS
  Administered 2021-04-14 (×2): 1 [IU] via SUBCUTANEOUS

## 2021-04-12 MED ORDER — INSULIN ASPART 100 UNIT/ML IJ SOLN: 0.0000 [IU] | Freq: Every day | INTRAMUSCULAR | Status: DC

## 2021-04-12 MED ORDER — PANTOPRAZOLE SODIUM 40 MG PO TBEC
40.0000 mg | DELAYED_RELEASE_TABLET | Freq: Two times a day (BID) | ORAL | Status: DC
  Administered 2021-04-12 – 2021-04-14 (×4): 40 mg via ORAL
  Filled 2021-04-12 (×4): qty 1

## 2021-04-12 MED ORDER — FUROSEMIDE 10 MG/ML IJ SOLN
20.0000 mg | Freq: Once | INTRAMUSCULAR | Status: AC
Start: 2021-04-12 — End: 2021-04-12
  Administered 2021-04-12: 20 mg via INTRAVENOUS
  Filled 2021-04-12: qty 2

## 2021-04-12 MED ORDER — ACETAMINOPHEN 325 MG PO TABS: 650.0000 mg | ORAL_TABLET | Freq: Four times a day (QID) | ORAL | Status: DC | PRN

## 2021-04-12 MED ORDER — ASPIRIN EC 81 MG PO TBEC
81.0000 mg | DELAYED_RELEASE_TABLET | Freq: Every day | ORAL | Status: DC
  Administered 2021-04-13 – 2021-04-14 (×2): 81 mg via ORAL
  Filled 2021-04-12 (×2): qty 1

## 2021-04-12 NOTE — ED Notes (Signed)
Wife reports pt was taken off lasix and spironolactone and started on lisinopril and carvedilol.

## 2021-04-12 NOTE — ED Provider Notes (Signed)
[facility] Provider Note   CSN: [id number] Arrival date & time: 04/12/21  1140     History  Chief Complaint  Patient presents with   Shortness of Breath    Tyler Farmer is a 81 y.o. male.  Patient with shortness of breath.  Patient has a history of atrial fib and diabetes and cirrhosis.  The history is provided by the patient and medical records. No language interpreter was used.  Shortness of Breath Severity:  Mild Onset quality:  Sudden Timing:  Constant Progression:  Waxing and waning Chronicity:  Recurrent Context: activity   Relieved by:  Nothing Worsened by:  Nothing Ineffective treatments:  None tried Associated symptoms: no abdominal pain, no chest pain, no cough, no headaches and no rash       Home Medications Prior to Admission medications   Medication Sig Start Date End Date Taking? Authorizing Provider  acetaminophen (TYLENOL) 500 MG tablet Take 500 mg by mouth every 6 (six) hours as needed for mild pain.   Yes [provider]  aspirin EC 81 MG tablet Take 1 tablet (81 mg total) by mouth daily with breakfast. Swallow whole. 02/21/21  Yes Roxan Hockey, MD  carvedilol (COREG) 3.125 MG tablet Take 1 tablet (3.125 mg total) by mouth 2 (two) times daily with a meal. 03/30/21 04/29/21 Yes Kommor, Madison, MD  Cranberry 500 MG TABS Take 500 mg by mouth daily.   Yes [provider]  dicyclomine (BENTYL) 10 MG capsule Take 10 mg by mouth 2 (two) times daily as needed for spasms. 07/14/20  Yes Rehman, Mechele Dawley, MD  ferrous sulfate 325 (65 FE) MG tablet Take 325 mg by mouth daily with breakfast.   Yes [provider]  glipiZIDE (GLUCOTROL) 10 MG tablet Take 10 mg by mouth 2 (two) times daily before a meal.   Yes [provider]  lactulose (CHRONULAC) 10 GM/15ML solution Take 45 mLs (30 g total) by mouth 2 (two) times daily. 03/23/21 04/22/21 Yes Harvel Quale, MD  levothyroxine (SYNTHROID) 137 MCG  tablet Take 137 mcg by mouth daily before breakfast.   Yes [provider]  lisinopril (ZESTRIL) 5 MG tablet Take 5 mg by mouth daily. 02/25/21  Yes [provider]  magnesium oxide (MAG-OX) 400 MG tablet Take 400 mg by mouth daily.   Yes [provider]  ondansetron (ZOFRAN) 4 MG tablet Take 1 tablet (4 mg total) by mouth every 8 (eight) hours as needed for nausea or vomiting. 03/10/21  Yes Monte[place] Morita, Quillian Quince, MD  pantoprazole (PROTONIX) 40 MG tablet Take 1 tablet (40 mg total) by mouth 2 (two) times daily. 02/14/21 04/12/21 Yes Shah, Pratik D, DO  simvastatin (ZOCOR) 40 MG tablet Take 40 mg by mouth at bedtime.    Yes [provider]      Allergies    Penicillins    Review of Systems   Review of Systems  Constitutional:  Negative for appetite change and fatigue.  HENT:  Negative for congestion, ear discharge and sinus pressure.   Eyes:  Negative for discharge.  Respiratory:  Positive for shortness of breath. Negative for cough.   Cardiovascular:  Negative for chest pain.  Gastrointestinal:  Negative for abdominal pain and diarrhea.  Genitourinary:  Negative for frequency and hematuria.  Musculoskeletal:  Negative for back pain.  Skin:  Negative for rash.  Neurological:  Negative for seizures and headaches.  Psychiatric/Behavioral:  Negative for hallucinations.    Physical Exam Updated Vital  Signs BP 108/75    Pulse (!) 102    Temp 97.6 F (36.4 C)    Resp (!) 26    SpO2 94%  Physical Exam Vitals and nursing note reviewed.  Constitutional:      Appearance: He is well-developed.  HENT:     Head: Normocephalic.     Right Ear: External ear normal.  Eyes:     General: No scleral icterus.    Conjunctiva/sclera: Conjunctivae normal.  Neck:     Thyroid: No thyromegaly.  Cardiovascular:     Rate and Rhythm: Normal rate. Rhythm irregular.     Heart sounds: No murmur heard.   No friction rub. No gallop.  Pulmonary:     Breath sounds: No  stridor. No wheezing or rales.  Chest:     Chest wall: No tenderness.  Abdominal:     General: There is no distension.     Tenderness: There is no abdominal tenderness. There is no rebound.  Musculoskeletal:        General: Normal range of motion.     Cervical back: Neck supple.  Lymphadenopathy:     Cervical: No cervical adenopathy.  Skin:    Findings: No erythema or rash.  Neurological:     Mental Status: He is alert and oriented to person, place, and time.     Motor: No abnormal muscle tone.     Coordination: Coordination normal.  Psychiatric:        Behavior: Behavior normal.    ED Results / Procedures / Treatments   Labs (all labs ordered are listed, but only abnormal results are displayed) Labs Reviewed  CBC WITH DIFFERENTIAL/PLATELET - Abnormal; Notable for the following components:      Result Value   RBC 3.45 (*)    Hemoglobin 10.8 (*)    HCT 33.5 (*)    RDW 19.4 (*)    Platelets 121 (*)    All other components within normal limits  COMPREHENSIVE METABOLIC PANEL - Abnormal; Notable for the following components:   Glucose, Bld 163 (*)    Creatinine, Ser 1.34 (*)    Calcium 8.7 (*)    Total Protein 6.3 (*)    Albumin 2.7 (*)    Total Bilirubin 2.8 (*)    GFR, Estimated 54 (*)    All other components within normal limits  BRAIN NATRIURETIC PEPTIDE - Abnormal; Notable for the following components:   B Natriuretic Peptide 421.0 (*)    All other components within normal limits  TROPONIN I (HIGH SENSITIVITY)    EKG None  Radiology DG Chest Port 1 View  Result Date: 04/12/2021 CLINICAL DATA:  Shortness of breath EXAM: PORTABLE CHEST 1 VIEW COMPARISON:  02/20/2021, 01/27/2021 FINDINGS: Mild cardiomegaly. Atherosclerotic calcification of the aortic knob. Persistently increased interstitial markings throughout both lungs. Interval development of a small left pleural effusion. Streaky left basilar opacity. No pneumothorax. IMPRESSION: 1. Interval development of a  small left pleural effusion with adjacent left basilar atelectasis/consolidation. 2. Persistently increased interstitial markings throughout both lungs may reflect underlying interstitial lung disease or edema. Electronically Signed   By: Davina Poke D.O.   On: 04/12/2021 12:42    Procedures Procedures    Medications Ordered in ED Medications  furosemide (LASIX) injection 20 mg (has no administration in time range)  metoprolol tartrate (LOPRESSOR) injection 2.5 mg (2.5 mg Intravenous Given 04/12/21 1249)    ED Course/ Medical Decision Making/ A&P  Medical Decision Making Amount and/or Complexity of Data Reviewed Labs: ordered. Radiology: ordered.  Risk Prescription drug management. Decision regarding hospitalization.   Patient with shortness of breath most likely secondary to congestive heart failure.  Patient has a CT angio of the chest pending and will will be admitted to medicine   This patient presents to the ED for concern of shortness of breath, this involves an extensive number of treatment options, and is a complaint that carries with it a high risk of complications and morbidity.  The differential diagnosis includes congestive heart failure pneumonia   Co morbidities that complicate the patient evaluation  Atrial fibs, cirrhosis   Additional history obtained:  Additional history obtained from wife and patient External records from outside source obtained and reviewed including hospital records   Lab Tests:  I Ordered, and personally interpreted labs.  The pertinent results include: CBC and chemistries show mild anemia 10.8 and mild elevated bilirubin 2.8   Imaging Studies ordered:  I ordered imaging studies including chest x-ray and CT chest I independently visualized and interpreted imaging which showed congestive heart failure I agree with the radiologist interpretation   Cardiac Monitoring:  The patient was maintained on  a cardiac monitor.  I personally viewed and interpreted the cardiac monitored which showed an underlying rhythm of: Rapid atrial fibs   Medicines ordered and prescription drug management:  I ordered medication including Lopressor for rapid rate and Lasix for heart failure Reevaluation of the patient after these medicines showed that the patient improved I have reviewed the patients home medicines and have made adjustments as needed   Test Considered:  CT abdomen   Critical Interventions:  IV Lasix and Lopressor   Consultations Obtained:  I requested consultation with the hospitalist,  and discussed lab and imaging findings as well as pertinent plan - they recommend: Admission for diuresis and cardiac consult   Problem List / ED Course:  Heart failure and atrial fibs   Reevaluation:  After the interventions noted above, I reevaluated the patient and found that they have :improved   Social Determinants of Health: None   Dispostion:  After consideration of the diagnostic results and the patients response to treatment, I feel that the patent would benefit from admission to medicine with cardiology consult.         Final Clinical Impression(s) / ED Diagnoses Final diagnoses:  SOB (shortness of breath)    Rx / DC Orders ED Discharge Orders     None         Milton Ferguson, MD 04/14/21 [id number]

## 2021-04-12 NOTE — Telephone Encounter (Signed)
Thanks for the update, agree with this. He has an appointment with Dr. Serafina Royals in 2 days to assess TIPS [facility], he is going to the ED today).

## 2021-04-12 NOTE — Telephone Encounter (Signed)
Patient wife called back and states she was told to take patient to Ed by pcp. I advised the same as she states the patient has shortness of breath and abdominal swelling, She thinks he has gained some fluid.

## 2021-04-12 NOTE — Plan of Care (Signed)
Patient arrived from ED alert and oriented x3-4. Forgetful ati times. Wife at bedside assisting with admission assessment. Patient settled into room. Will continue to monitor according to orders.

## 2021-04-12 NOTE — H&P (Signed)
History and Physical    Tyler Farmer NGE:[id number] DOB: May 04, 1940 DOA: 04/12/2021  PCP: [name], MD   Patient coming from: Home  I have personally briefly reviewed patient's old medical records in [place]  Chief Complaint: Difficulty breathing  HPI: Tyler Farmer is a 81 y.o. male with medical history significant for liver cirrhosis with hepatic encephalopathy, COPD, diabetes mellitus, hypertension, congestive heart failure, atrial fibrillation. Patient presented to the ED with complaints of difficulty breathing, abdominal swelling, and leg swelling started about the beginning of this month.  Patient used to be on Lasix 20 mg daily and spironolactone 50 mg daily, but this was discontinued - due to concerns for his kidney.  ED Course: Temperature 97.6.  Heart rate 92-111.  Respiratory rate 15-29.  O2 sats greater than 94% on room air.  BNP 421 no prior to compare.  Troponin 11 > 12.  CTA negative for PE, shows findings concerning for congestive heart failure.  EKG showing atrial fibrillation.  IV lasix 20 mg x 1 given, hospitalist to see.  Review of Systems: As per HPI all other systems reviewed and negative.  Past Medical History:  Diagnosis Date   A-fib [facility])    AKI (acute kidney injury) ([place]) 01/27/2021   Arthritis    Hands/Fingers   Asthma    Back pain    Cirrhosis of liver (HCC)    Diabetes mellitus ([place])    Enlarged prostate    Gastric ulcer    GERD (gastroesophageal reflux disease)    H/O bladder problems    History of kidney stones    Hypercholesteremia    Hypertension    Prostate cancer ([place])    Thyroid disease    UGI bleed 10/26/2016   Vitamin B12 deficiency 10/26/2016    Past Surgical History:  Procedure Laterality Date   CIRCUMCISION     at age 76   COLONOSCOPY N/A 11/13/2015   Procedure: COLONOSCOPY;  Surgeon: Rogene Houston, MD;  [facility];  Service: Endoscopy;  Laterality: N/A;  2:10   COLONOSCOPY WITH PROPOFOL  N/A 06/17/2020   Procedure: COLONOSCOPY WITH PROPOFOL;  Surgeon: Rogene Houston, MD;  [facility];  Service: Endoscopy;  Laterality: N/A;  AM / patient had positive covid 05/25/20   ESOPHAGEAL BANDING N/A 02/13/2021   Procedure: ESOPHAGEAL BANDING;  Surgeon: Harvel Quale, MD;  [facility];  Service: Gastroenterology;  Laterality: N/A;   ESOPHAGOGASTRODUODENOSCOPY N/A 06/03/2014   Procedure: ESOPHAGOGASTRODUODENOSCOPY (EGD);  Surgeon: Rogene Houston, MD;  [facility];  Service: Endoscopy;  Laterality: N/A;  730   ESOPHAGOGASTRODUODENOSCOPY N/A 10/26/2016   Procedure: ESOPHAGOGASTRODUODENOSCOPY (EGD);  Surgeon: Rogene Houston, MD;  [facility];  Service: Endoscopy;  Laterality: N/A;   ESOPHAGOGASTRODUODENOSCOPY (EGD) WITH PROPOFOL N/A 06/17/2020   Procedure: ESOPHAGOGASTRODUODENOSCOPY (EGD) WITH PROPOFOL;  Surgeon: Rogene Houston, MD;  [facility];  Service: Endoscopy;  Laterality: N/A;   ESOPHAGOGASTRODUODENOSCOPY (EGD) WITH PROPOFOL N/A 02/13/2021   Procedure: ESOPHAGOGASTRODUODENOSCOPY (EGD) WITH PROPOFOL;  Surgeon: Harvel Quale, MD;  [facility];  Service: Gastroenterology;  Laterality: N/A;   Gastric Ulcer  1993   GIVENS CAPSULE STUDY N/A 10/28/2016   Procedure: GIVENS CAPSULE STUDY;  Surgeon: Rogene Houston, MD;  [facility];  Service: Endoscopy;  Laterality: N/A;   HERNIA REPAIR     IR EMBO VENOUS NOT HEMORR HEMANG  INC GUIDE ROADMAPPING  03/05/2021   IR  INTRAVASCULAR ULTRASOUND NON CORONARY  03/05/2021   IR RADIOLOGIST EVAL & MGMT  02/26/2021   IR THROMBECT VENO MECH MOD SED  03/05/2021   IR TIPS  03/05/2021   IR US GUIDE VASC ACCESS RIGHT  03/05/2021   IR US GUIDE VASC ACCESS RIGHT  03/05/2021   POLYPECTOMY  11/13/2015   Procedure: POLYPECTOMY;  Surgeon: Rogene Houston, MD;  [facility];  Service: Endoscopy;;  colon   POLYPECTOMY  06/17/2020   Procedure:  POLYPECTOMY;  Surgeon: Rogene Houston, MD;  [facility];  Service: Endoscopy;;  sigmoid   RADIOLOGY WITH ANESTHESIA N/A 03/05/2021   Procedure: RADIOLOGY WITH ANESTHESIA    I.R. Rise Paganini;  Surgeon: Suzette Battiest, MD;  Location: [facility];  Service: Radiology;  Laterality: N/A;   Right Elbow Right    A pin was put in     reports that he quit smoking about 31 years ago. His smoking use included cigarettes. He quit smokeless tobacco use about 31 years ago. He reports that he does not drink alcohol and does not use drugs.  Allergies  Allergen Reactions   Penicillins Nausea And Vomiting    Has patient had a PCN reaction causing immediate rash, facial/tongue/throat swelling, SOB or lightheadedness with hypotension: Yes Has patient had a PCN reaction causing severe rash involving mucus membranes or skin necrosis: No Has patient had a PCN reaction that required hospitalization: No Has patient had a PCN reaction occurring within the last 10 years: No If all of the above answers are "NO", then may proceed with Cephalosporin use.   Patient states that it also caused pain in his sides.    Family History  Problem Relation Age of Onset   Emphysema Mother    Cerebrovascular Accident Father    Heart disease Father    Diabetes Sister    Heart disease Brother    Heart disease Sister    Heart disease Brother    Diabetes Brother    Prior to Admission medications   Medication Sig Start Date End Date Taking? Authorizing Provider  acetaminophen (TYLENOL) 500 MG tablet Take 500 mg by mouth every 6 (six) hours as needed for mild pain.   Yes [provider]  aspirin EC 81 MG tablet Take 1 tablet (81 mg total) by mouth daily with breakfast. Swallow whole. 02/21/21  Yes Roxan Hockey, MD  carvedilol (COREG) 3.125 MG tablet Take 1 tablet (3.125 mg total) by mouth 2 (two) times daily with a meal. 03/30/21 04/29/21 Yes Kommor, Madison, MD  Cranberry 500 MG TABS Take 500 mg by mouth daily.    Yes [provider]  dicyclomine (BENTYL) 10 MG capsule Take 10 mg by mouth 2 (two) times daily as needed for spasms. 07/14/20  Yes Rehman, Mechele Dawley, MD  ferrous sulfate 325 (65 FE) MG tablet Take 325 mg by mouth daily with breakfast.   Yes [provider]  glipiZIDE (GLUCOTROL) 10 MG tablet Take 10 mg by mouth 2 (two) times daily before a meal.   Yes [provider]  lactulose (CHRONULAC) 10 GM/15ML solution Take 45 mLs (30 g total) by mouth 2 (two) times daily. 03/23/21 04/22/21 Yes Harvel Quale, MD  levothyroxine (SYNTHROID) 137 MCG tablet Take 137 mcg by mouth daily before breakfast.   Yes [provider]  lisinopril (ZESTRIL) 5 MG tablet Take 5 mg by mouth daily. 02/25/21  Yes [provider]  magnesium oxide (MAG-OX) 400 MG tablet Take 400 mg by  mouth daily.   Yes [provider]  ondansetron (ZOFRAN) 4 MG tablet Take 1 tablet (4 mg total) by mouth every 8 (eight) hours as needed for nausea or vomiting. 03/10/21  Yes Montez Morita, Quillian Quince, MD  pantoprazole (PROTONIX) 40 MG tablet Take 1 tablet (40 mg total) by mouth 2 (two) times daily. 02/14/21 04/12/21 Yes Shah, Pratik D, DO  simvastatin (ZOCOR) 40 MG tablet Take 40 mg by mouth at bedtime.    Yes [provider]    Physical Exam: Vitals:   04/12/21 1530 04/12/21 1600 04/12/21 1630 04/12/21 1700  BP: 118/75 115/75 112/66 102/77  Pulse: (!) 107 (!) 111 (!) 101 (!) 102  Resp: (!) 22 (!) 25 (!) 29 20  Temp:      SpO2: 94% 96% 94% 96%    Constitutional: NAD, calm, comfortable Vitals:   04/12/21 1530 04/12/21 1600 04/12/21 1630 04/12/21 1700  BP: 118/75 115/75 112/66 102/77  Pulse: (!) 107 (!) 111 (!) 101 (!) 102  Resp: (!) 22 (!) 25 (!) 29 20  Temp:      SpO2: 94% 96% 94% 96%   Eyes: PERRL, lids and conjunctivae normal ENMT: Mucous membranes are moist.   Neck: normal, supple, no masses, no thyromegaly Respiratory: clear to auscultation bilaterally, no  wheezing, no crackles. Normal respiratory effort. No accessory muscle use.  Cardiovascular: Regular rate and rhythm, no murmurs / rubs / gallops.  1+ pitting extremity edema Abdomen: full, no tenderness, no masses palpated. No hepatosplenomegaly. Bowel sounds positive.  Musculoskeletal: no clubbing / cyanosis. No joint deformity upper and lower extremities. Good ROM, no contractures. Normal muscle tone.  Skin: no rashes, lesions, ulcers. No induration Neurologic: No apparent cranial nerve abnormality, moving extremities spontaneously. Psychiatric: Normal judgment and insight. Alert and oriented x 3. Normal mood.   Labs on Admission: I have personally reviewed following labs and imaging studies  CBC: Recent Labs  Lab 04/12/21 1234  WBC 4.8  NEUTROABS 3.0  HGB 10.8*  HCT 33.5*  MCV 97.1  PLT 622*   Basic Metabolic Panel: Recent Labs  Lab 04/08/21 1109 04/12/21 1234  NA 140 136  K 4.4 4.0  CL 109 104  CO2 26 22  GLUCOSE 164* 163*  BUN 15 15  CREATININE 1.43* 1.34*  CALCIUM 8.5* 8.7*    Liver Function Tests: Recent Labs  Lab 04/08/21 1109 04/12/21 1234  AST 24 26  ALT 18 19  ALKPHOS  --  126  BILITOT 2.1* 2.8*  PROT 6.2 6.3*  ALBUMIN  --  2.7*   Radiological Exams on Admission: CT Angio Chest PE W and/or Wo Contrast  Result Date: 04/12/2021 CLINICAL DATA:  Pulmonary embolism suspected, high probability. Shortness of breath since last night. EXAM: CT ANGIOGRAPHY CHEST WITH CONTRAST TECHNIQUE: Multidetector CT imaging of the chest was performed using the standard protocol during bolus administration of intravenous contrast. Multiplanar CT image reconstructions and MIPs were obtained to evaluate the vascular anatomy. RADIATION DOSE REDUCTION: This exam was performed according to the departmental dose-optimization program which includes automated exposure control, adjustment of the mA and/or kV according to patient size and/or use of iterative reconstruction technique.  CONTRAST:  56m OMNIPAQUE IOHEXOL 350 MG/ML SOLN COMPARISON:  Radiographs same date. Abdominopelvic CT 02/12/2021. No prior chest CTs are currently available for correlation. FINDINGS: Cardiovascular: The pulmonary arteries are well opacified with contrast to the level of the subsegmental branches. There is no evidence of acute pulmonary embolism. There is atherosclerosis of the aorta, great vessels and  coronary arteries. There is limited systemic arterial opacification without evidence of acute vascular abnormality. The heart size is normal. There is no pericardial effusion. Mediastinum/Nodes: There are no enlarged mediastinal, hilar or axillary lymph nodes.Mild nonspecific esophageal wall thickening without significant distension. The thyroid gland and trachea demonstrate no significant findings. Lungs/Pleura: Moderate-sized dependent pleural effusions bilaterally. Evidence of underlying at least moderate centrilobular emphysema with diffuse central airway thickening. There is scattered interstitial opacities throughout both lungs and mild dependent atelectasis, but no confluent airspace opacity or suspicious nodularity. Upper abdomen: TIPS shunt noted in the liver. Patency of this shunt cannot be confirmed on this examination due to limited portal vein opacification. Underlying morphologic changes of cirrhosis and vascular embolization clips are noted. Musculoskeletal/Chest wall: There is no chest wall mass or suspicious osseous finding. Mild degenerative changes in the spine. Bilateral gynecomastia. Review of the MIP images confirms the above findings. IMPRESSION: 1. No evidence of acute pulmonary embolism or other acute vascular findings in the chest. 2. Moderate-sized pleural effusions with diffuse interstitial opacities superimposed on emphysema, suspicious for congestive heart failure. Recommend chest radiographic follow-up. 3. Coronary and Aortic Atherosclerosis (ICD10-I70.0). Emphysema (ICD10-J43.9). 4.  Patency of the hepatic TIPS shunt cannot be addressed by this examination. Electronically Signed   By: Richardean Sale M.D.   On: 04/12/2021 16:16   DG Chest Port 1 View  Result Date: 04/12/2021 CLINICAL DATA:  Shortness of breath EXAM: PORTABLE CHEST 1 VIEW COMPARISON:  02/20/2021, 01/27/2021 FINDINGS: Mild cardiomegaly. Atherosclerotic calcification of the aortic knob. Persistently increased interstitial markings throughout both lungs. Interval development of a small left pleural effusion. Streaky left basilar opacity. No pneumothorax. IMPRESSION: 1. Interval development of a small left pleural effusion with adjacent left basilar atelectasis/consolidation. 2. Persistently increased interstitial markings throughout both lungs may reflect underlying interstitial lung disease or edema. Electronically Signed   By: Davina Poke D.O.   On: 04/12/2021 12:42    EKG: Independently reviewed.  Atrial fibrillation rate 118.  QTc 477.  No significant change from prior.  Assessment/Plan Principal Problem:   Acute diastolic CHF (congestive heart failure) (HCC) Active Problems:   Liver cirrhosis secondary to NASH (HCC)   COPD (chronic obstructive pulmonary disease) (HCC)   Encephalopathy, hepatic   Benign essential hypertension   Diabetes (HCC)   Cirrhosis of liver (HCC)   Acute diastolic CHF-dyspnea, abdominal bloating, leg swelling, CTA chest suggest congestive heart failure with moderate pleural effusion and interstitial opacities.  BNP 421, no prior to compare.  Not hypoxic.  Per chart patient has gained ~ 20 pounds of weight since December. Last echo - done 1 yr ago, 03/2020, EF 60 to 65% with mild LVH, indeterminate LV diastolic parameters. Troponin 11 > 12.  -IV Lasix 40 twice daily -Daily BMP, strict input output, daily weights  Liver cirrhosis hepatic encephalopathy-stable.  On lactulose twice daily with at least 2 bowel movements daily. - Resume lactulose -Resume spironolactone and  Lasix  Hypertension- blood pressure soft. -Hold carvedilol 3.189m BID for now to allow for diuresis. -Hold lisinopril with contrast exposure  Controlled diabetes mellitus-A1c 6.8 - SSI- S -Hold home glipizide  COPD-stable.   DVT prophylaxis: Lovenox Code Status: Full, confirmed with patient and spouse at bedside. Family Communication: Spouse at bedside Disposition Plan: ~ />  2 days Consults called: None Admission status: Inpt, tele I certify that at the point of admission it is my clinical judgment that the patient will require inpatient hospital care spanning beyond 2 midnights from the point of admission  due to high intensity of service, high risk for further deterioration and high frequency of surveillance required.    Bethena Roys MD [facility]  04/12/2021, 8:59 PM

## 2021-04-12 NOTE — Telephone Encounter (Signed)
I have called both numbers listed in the chart and I left a vm on both that if they still needed assistance to please return call.  Patient wife called and left a message on vm that the patient was having some issues and wanted advise. She states the patient has an appointment today with his pcp.

## 2021-04-12 NOTE — ED Notes (Signed)
Pt placed on cardiac monitor with BP to set cycle every 30 minutes. Continuous pulse oximeter applied. EKG completed.  Dr. Roderic Palau to bedside.

## 2021-04-12 NOTE — ED Triage Notes (Signed)
Shortness of breath, history of afib, referred by PCP

## 2021-04-13 ENCOUNTER — Other Ambulatory Visit (HOSPITAL_COMMUNITY): Payer: Self-pay | Admitting: *Deleted

## 2021-04-13 ENCOUNTER — Inpatient Hospital Stay (HOSPITAL_COMMUNITY): Payer: Medicare Other

## 2021-04-13 DIAGNOSIS — R079 Chest pain, unspecified: Secondary | ICD-10-CM

## 2021-04-13 DIAGNOSIS — I5033 Acute on chronic diastolic (congestive) heart failure: Secondary | ICD-10-CM

## 2021-04-13 DIAGNOSIS — I480 Paroxysmal atrial fibrillation: Secondary | ICD-10-CM

## 2021-04-13 DIAGNOSIS — E785 Hyperlipidemia, unspecified: Secondary | ICD-10-CM

## 2021-04-13 LAB — BASIC METABOLIC PANEL
Anion gap: 9 (ref 5–15)
BUN: 16 mg/dL (ref 8–23)
CO2: 26 mmol/L (ref 22–32)
Calcium: 9.2 mg/dL (ref 8.9–10.3)
Chloride: 104 mmol/L (ref 98–111)
Creatinine, Ser: 1.39 mg/dL — ABNORMAL HIGH (ref 0.61–1.24)
GFR, Estimated: 51 mL/min — ABNORMAL LOW (ref >60)
Glucose, Bld: 121 mg/dL — ABNORMAL HIGH (ref 70–99)
Potassium: 3.4 mmol/L — ABNORMAL LOW (ref 3.5–5.1)
Sodium: 139 mmol/L (ref 135–145)

## 2021-04-13 LAB — ECHOCARDIOGRAM COMPLETE
AR max vel: 2.26 cm2
AV Area VTI: 2.39 cm2
AV Area mean vel: 2.52 cm2
AV Mean grad: 4 mmHg
AV Peak grad: 6.7 mmHg
Ao pk vel: 1.29 m/s
Area-P 1/2: 4.21 cm2
Height: 69 in
MV M vel: 4.79 m/s
MV Peak grad: 91.8 mmHg
MV VTI: 2.63 cm2
S' Lateral: 2.7 cm
Weight: 3142.4 oz

## 2021-04-13 LAB — GLUCOSE, CAPILLARY
Glucose-Capillary: 127 mg/dL — ABNORMAL HIGH (ref 70–99)
Glucose-Capillary: 142 mg/dL — ABNORMAL HIGH (ref 70–99)
Glucose-Capillary: 182 mg/dL — ABNORMAL HIGH (ref 70–99)
Glucose-Capillary: 124 mg/dL — ABNORMAL HIGH (ref 70–99)

## 2021-04-13 MED ORDER — MELATONIN 3 MG PO TABS
3.0000 mg | ORAL_TABLET | Freq: Once | ORAL | Status: AC
  Administered 2021-04-13: 21:00:00 3 mg via ORAL
  Filled 2021-04-13: qty 1

## 2021-04-13 MED ORDER — POTASSIUM CHLORIDE 20 MEQ PO PACK
40.0000 meq | PACK | Freq: Once | ORAL | Status: AC
  Administered 2021-04-13: 10:00:00 40 meq via ORAL
  Filled 2021-04-13: qty 2

## 2021-04-13 MED ORDER — CARVEDILOL 3.125 MG PO TABS: 3.1250 mg | ORAL_TABLET | Freq: Two times a day (BID) | ORAL | Status: DC

## 2021-04-13 MED ORDER — CARVEDILOL 3.125 MG PO TABS
3.1250 mg | ORAL_TABLET | Freq: Two times a day (BID) | ORAL | Status: DC
  Administered 2021-04-13 – 2021-04-14 (×3): 3.125 mg via ORAL
  Filled 2021-04-13 (×3): qty 1

## 2021-04-13 MED ORDER — SIMVASTATIN 20 MG PO TABS
40.0000 mg | ORAL_TABLET | Freq: Every day | ORAL | Status: DC
  Administered 2021-04-13: 21:00:00 40 mg via ORAL
  Filled 2021-04-13: qty 2

## 2021-04-13 MED ORDER — LISINOPRIL 5 MG PO TABS: 5.0000 mg | ORAL_TABLET | Freq: Every day | ORAL | Status: DC

## 2021-04-13 NOTE — Progress Notes (Signed)
*  PRELIMINARY RESULTS* Echocardiogram 2D Echocardiogram has been performed.  Samuel Germany 04/13/2021, 3:07 PM

## 2021-04-13 NOTE — Evaluation (Signed)
Physical Therapy Evaluation Patient Details Name: Tyler Farmer MRN: [id number] DOB: 1940/10/21 Today's Date: 04/13/2021  History of Present Illness  Tyler Farmer is a 81 y.o. male with medical history significant for liver cirrhosis with hepatic encephalopathy, COPD, diabetes mellitus, hypertension, congestive heart failure, atrial fibrillation.  Patient presented to the ED with complaints of difficulty breathing, abdominal swelling, and leg swelling started about the beginning of this month.  Patient used to be on Lasix 20 mg daily and spironolactone 50 mg daily, but this was discontinued - due to concerns for his kidney.   Clinical Impression  Patient functioning at baseline for functional mobility and gait demonstrating good return for bed mobility, transfers and ambulation in room/hallway without loss of balance.  Patient encouraged to stay active and walk daily as tolerated when he returns home.  Plan:  Patient discharged from physical therapy to care of nursing for ambulation daily as tolerated for length of stay.         Recommendations for follow up therapy are one component of a multi-disciplinary discharge planning process, led by the attending physician.  Recommendations may be updated based on patient status, additional functional criteria and insurance authorization.  Follow Up Recommendations No PT follow up    Assistance Recommended at Discharge PRN  Patient can return home with the following  Help with stairs or ramp for entrance;Assistance with cooking/housework    Equipment Recommendations None recommended by PT  Recommendations for Other Services       Functional Status Assessment Patient has not had a recent decline in their functional status     Precautions / Restrictions Precautions Precautions: None Restrictions Weight Bearing Restrictions: No      Mobility  Bed Mobility Overal bed mobility: Independent                   Transfers Overall transfer level: Modified independent                      Ambulation/Gait Ambulation/Gait assistance: Modified independent (Device/Increase time) Gait Distance (Feet): 100 Feet Assistive device: None Gait Pattern/deviations: Drifts right/left, Decreased step length - right, Decreased step length - left, Decreased stride length Gait velocity: slightly decreased     General Gait Details: demonstrates good return for ambulation in room and hallway with occasional drifting left/right, able to self correct, no loss of balance  Stairs            Wheelchair Mobility    Modified Rankin (Stroke Patients Only)       Balance Overall balance assessment: Mild deficits observed, not formally tested                                           Pertinent Vitals/Pain Pain Assessment Pain Assessment: No/denies pain    Home Living Family/patient expects to be discharged to:: Private residence Living Arrangements: Spouse/significant other Available Help at Discharge: Family;Available 24 hours/day Type of Home: House Home Access: Stairs to enter Entrance Stairs-Rails: Right;Left;Can reach both Entrance Stairs-Number of Steps: 2   Home Layout: One level Home Equipment: Conservation officer, nature (2 wheels);Cane - single point;Shower seat      Prior Function Prior Level of Function : Independent/Modified Independent             Mobility Comments: Community ambulator without AD, occasionally drives ADLs Comments: Independent     Hand  Dominance        Extremity/Trunk Assessment   Upper Extremity Assessment Upper Extremity Assessment: Overall WFL for tasks assessed    Lower Extremity Assessment Lower Extremity Assessment: Overall WFL for tasks assessed    Cervical / Trunk Assessment Cervical / Trunk Assessment: Kyphotic  Communication   Communication: No difficulties  Cognition Arousal/Alertness: Awake/alert Behavior During  Therapy: WFL for tasks assessed/performed Overall Cognitive Status: Within Functional Limits for tasks assessed                                          General Comments      Exercises     Assessment/Plan    PT Assessment Patient does not need any further PT services  PT Problem List         PT Treatment Interventions      PT Goals (Current goals can be found in the Care Plan section)  Acute Rehab PT Goals Patient Stated Goal: return home with family to assist PT Goal Formulation: With patient/family Time For Goal Achievement: 04/13/21 Potential to Achieve Goals: Good    Frequency       Co-evaluation               AM-PAC PT "6 Clicks" Mobility  Outcome Measure Help needed turning from your back to your side while in a flat bed without using bedrails?: None Help needed moving from lying on your back to sitting on the side of a flat bed without using bedrails?: None Help needed moving to and from a bed to a chair (including a wheelchair)?: None Help needed standing up from a chair using your arms (e.g., wheelchair or bedside chair)?: None Help needed to walk in hospital room?: None Help needed climbing 3-5 steps with a railing? : A Little 6 Click Score: 23    End of Session   Activity Tolerance: Patient tolerated treatment well Patient left: in chair;with call bell/phone within reach;with family/visitor present Nurse Communication: Mobility status PT Visit Diagnosis: Unsteadiness on feet (R26.81);Other abnormalities of gait and mobility (R26.89);Muscle weakness (generalized) (M62.81)    Time: 1130-1150 PT Time Calculation (min) (ACUTE ONLY): 20 min   Charges:   PT Evaluation $PT Eval Moderate Complexity: 1 Mod PT Treatments $Therapeutic Activity: 8-22 mins        12:15 PM, 04/13/21 Lonell Grandchild, MPT Physical Therapist with [facility] [phone] office [phone] mobile phone

## 2021-04-13 NOTE — Progress Notes (Signed)
Pt requested something to help him sleep tonight since, according to pt, he did not get any sleep last night. Dr. Dwyane Dee notified. One time order given for melatonin. Order placed by this writer and pt made aware.

## 2021-04-13 NOTE — Consult Note (Signed)
Cardiology Consultation:   Patient ID: Tyler Farmer MRN: [id number]; DOB: [birth year]  Admit date: 04/12/2021 Date of Consult: 04/13/2021  PCP:  [name], MD   [facility] Cardiologist:  Carlyle Dolly, MD   {    Patient Profile:   Tyler Farmer is a 81 y.o. male with a hx of paroxysmal atrial fibrillation (not on anticoagulation due to GIB and history of varices and appears persistent since 01/2021), Stage 3 CKD, cirrhosis, GERD, hypothyroidism and history of GIB (due to AVM's and esophageal varices) who is being seen 04/13/2021 for the evaluation of CHF at the request of Dr. Dwyane Dee.  History of Present Illness:   Mr. Lamere was last examined by Dr. Harl Bowie in 01/2021 and had recently been diagnosed with atrial fibrillation and started on Coreg by GI due to his elevated heart rate and to help with his varices.  He was not started on anticoagulation given his history of GI bleed and prior varices.  He was planning to undergo EGD on 03/30/2021 but was found to be in atrial fibrillation with RVR and sent to the ED for further evaluation. He had been off his Coreg and was recommended to restart Coreg 3.125 mg twice daily.  He presented to [facility] ED on 04/12/2021 for evaluation of worsening dyspnea after having been found to be in atrial fibrillation with RVR while at his PCP's office. Also reported worsening abdominal distension and lower extremity edema. His wife did report his PCP had recently discontinued his Lasix and Spironolactone.  Initial labs showed WBC 4.8, Hgb 10.8, platelets 121, K+ 136, K+ 4.0 and creatinine 1.34. AST 26 and ALT 19. BNP 421. Initial and repeat Hs Troponin 11 and 12. Negative for COVID and Influenza. CXR showed a small left pleural effusion with adjacent left basilar atelectasis/consolidation. CTA showed no evidence of a PE but was noted to have moderate size pleural effusions and diffuse interstitial opacities superimposed on  emphysema. EKG showed atrial fibrillation with RVR, heart rate 118. Echocardiogram is pending.  He received 40 mg of IV Lasix while in the ED and has been started on IV Lasix 40 mg twice daily. I&O's not fully recorded. Weight at 196 lbs (previously 193 lbs in 01/2021).  Received IV Lopressor in the ED but no further rate controlling agents have been ordered. Rates currently in the 120's.   Currently, the patient states he feels better since receiving lasix. No chest pain or SOB. Continues to have LE edema but he reports this is improved. States that his dry weight is around 180-185lbs but does not look like he has been at this weight for several months.   Past Medical History:  Diagnosis Date   A-fib [facility])    AKI (acute kidney injury) ([place]) 01/27/2021   Arthritis    Hands/Fingers   Asthma    Back pain    Cirrhosis of liver (HCC)    Diabetes mellitus ([place])    Enlarged prostate    Gastric ulcer    GERD (gastroesophageal reflux disease)    H/O bladder problems    History of kidney stones    Hypercholesteremia    Hypertension    Prostate cancer ([place])    Thyroid disease    UGI bleed 10/26/2016   Vitamin B12 deficiency 10/26/2016    Past Surgical History:  Procedure Laterality Date   CIRCUMCISION     at age 68   COLONOSCOPY N/A 11/13/2015   Procedure: COLONOSCOPY;  Surgeon: Rogene Houston,  MD;  [facility];  Service: Endoscopy;  Laterality: N/A;  2:10   COLONOSCOPY WITH PROPOFOL N/A 06/17/2020   Procedure: COLONOSCOPY WITH PROPOFOL;  Surgeon: Rogene Houston, MD;  [facility];  Service: Endoscopy;  Laterality: N/A;  AM / patient had positive covid 05/25/20   ESOPHAGEAL BANDING N/A 02/13/2021   Procedure: ESOPHAGEAL BANDING;  Surgeon: Harvel Quale, MD;  [facility];  Service: Gastroenterology;  Laterality: N/A;   ESOPHAGOGASTRODUODENOSCOPY N/A 06/03/2014   Procedure: ESOPHAGOGASTRODUODENOSCOPY (EGD);  Surgeon: Rogene Houston, MD;   [facility];  Service: Endoscopy;  Laterality: N/A;  730   ESOPHAGOGASTRODUODENOSCOPY N/A 10/26/2016   Procedure: ESOPHAGOGASTRODUODENOSCOPY (EGD);  Surgeon: Rogene Houston, MD;  [facility];  Service: Endoscopy;  Laterality: N/A;   ESOPHAGOGASTRODUODENOSCOPY (EGD) WITH PROPOFOL N/A 06/17/2020   Procedure: ESOPHAGOGASTRODUODENOSCOPY (EGD) WITH PROPOFOL;  Surgeon: Rogene Houston, MD;  [facility];  Service: Endoscopy;  Laterality: N/A;   ESOPHAGOGASTRODUODENOSCOPY (EGD) WITH PROPOFOL N/A 02/13/2021   Procedure: ESOPHAGOGASTRODUODENOSCOPY (EGD) WITH PROPOFOL;  Surgeon: Harvel Quale, MD;  [facility];  Service: Gastroenterology;  Laterality: N/A;   Gastric Ulcer  1993   GIVENS CAPSULE STUDY N/A 10/28/2016   Procedure: GIVENS CAPSULE STUDY;  Surgeon: Rogene Houston, MD;  [facility];  Service: Endoscopy;  Laterality: N/A;   HERNIA REPAIR     IR EMBO VENOUS NOT HEMORR HEMANG  INC GUIDE ROADMAPPING  03/05/2021   IR INTRAVASCULAR ULTRASOUND NON CORONARY  03/05/2021   IR RADIOLOGIST EVAL & MGMT  02/26/2021   IR THROMBECT VENO MECH MOD SED  03/05/2021   IR TIPS  03/05/2021   IR US GUIDE VASC ACCESS RIGHT  03/05/2021   IR US GUIDE VASC ACCESS RIGHT  03/05/2021   POLYPECTOMY  11/13/2015   Procedure: POLYPECTOMY;  Surgeon: Rogene Houston, MD;  [facility];  Service: Endoscopy;;  colon   POLYPECTOMY  06/17/2020   Procedure: POLYPECTOMY;  Surgeon: Rogene Houston, MD;  [facility];  Service: Endoscopy;;  sigmoid   RADIOLOGY WITH ANESTHESIA N/A 03/05/2021   Procedure: RADIOLOGY WITH ANESTHESIA    I.R. Rise Paganini;  Surgeon: Suzette Battiest, MD;  Location: [place];  Service: Radiology;  Laterality: N/A;   Right Elbow Right    A pin was put in     Home Medications:  Prior to Admission medications   Medication Sig Start Date End Date Taking? Authorizing Provider  acetaminophen (TYLENOL) 500 MG tablet Take 500 mg by  mouth every 6 (six) hours as needed for mild pain.   Yes [provider]  aspirin EC 81 MG tablet Take 1 tablet (81 mg total) by mouth daily with breakfast. Swallow whole. 02/21/21  Yes Roxan Hockey, MD  carvedilol (COREG) 3.125 MG tablet Take 1 tablet (3.125 mg total) by mouth 2 (two) times daily with a meal. 03/30/21 04/29/21 Yes Kommor, [place], MD  Cranberry 500 MG TABS Take 500 mg by mouth daily.   Yes [provider]  dicyclomine (BENTYL) 10 MG capsule Take 10 mg by mouth 2 (two) times daily as needed for spasms. 07/14/20  Yes Rehman, Mechele Dawley, MD  ferrous sulfate 325 (65 FE) MG tablet Take 325 mg by mouth daily with breakfast.   Yes [provider]  glipiZIDE (GLUCOTROL) 10 MG tablet Take 10 mg by mouth 2 (two) times daily before a meal.   Yes [provider]  lactulose (  CHRONULAC) 10 GM/15ML solution Take 45 mLs (30 g total) by mouth 2 (two) times daily. 03/23/21 04/22/21 Yes Harvel Quale, MD  levothyroxine (SYNTHROID) 137 MCG tablet Take 137 mcg by mouth daily before breakfast.   Yes [provider]  lisinopril (ZESTRIL) 5 MG tablet Take 5 mg by mouth daily. 02/25/21  Yes [provider]  magnesium oxide (MAG-OX) 400 MG tablet Take 400 mg by mouth daily.   Yes [provider]  ondansetron (ZOFRAN) 4 MG tablet Take 1 tablet (4 mg total) by mouth every 8 (eight) hours as needed for nausea or vomiting. 03/10/21  Yes Montez Morita, Quillian Quince, MD  pantoprazole (PROTONIX) 40 MG tablet Take 1 tablet (40 mg total) by mouth 2 (two) times daily. 02/14/21 04/12/21 Yes Shah, Pratik D, DO  simvastatin (ZOCOR) 40 MG tablet Take 40 mg by mouth at bedtime.    Yes [provider]    Inpatient Medications: Scheduled Meds:  aspirin EC  81 mg Oral Q breakfast   carvedilol  3.125 mg Oral BID   enoxaparin (LOVENOX) injection  40 mg Subcutaneous Q24H   furosemide  40 mg Intravenous BID   insulin aspart  0-5 Units Subcutaneous QHS    insulin aspart  0-9 Units Subcutaneous TID WC   lactulose  30 g Oral BID   levothyroxine  137 mcg Oral QAC breakfast   pantoprazole  40 mg Oral BID   simvastatin  40 mg Oral QHS   Continuous Infusions:  PRN Meds: acetaminophen **OR** acetaminophen, ondansetron **OR** ondansetron (ZOFRAN) IV  Allergies:    Allergies  Allergen Reactions   Penicillins Nausea And Vomiting    Has patient had a PCN reaction causing immediate rash, facial/tongue/throat swelling, SOB or lightheadedness with hypotension: Yes Has patient had a PCN reaction causing severe rash involving mucus membranes or skin necrosis: No Has patient had a PCN reaction that required hospitalization: No Has patient had a PCN reaction occurring within the last 10 years: No If all of the above answers are "NO", then may proceed with Cephalosporin use.   Patient states that it also caused pain in his sides.    Social History:   Social History   Socioeconomic History   Marital status: Married    Spouse name: Not on file   Number of children: Not on file   Years of education: Not on file   Highest education level: Not on file  Occupational History   Not on file  Tobacco Use   Smoking status: Former    Types: Cigarettes    Quit date: 05/25/1989    Years since quitting: 31.9   Smokeless tobacco: Former    Quit date: 01/16/1990  Vaping Use   Vaping Use: Never used  Substance and Sexual Activity   Alcohol use: No    Alcohol/week: 0.0 standard drinks    Comment: Drank many years - 30 years ago   Drug use: No   Sexual activity: Not on file  Other Topics Concern   Not on file  Social History Narrative   Not on file   Social Determinants of Health   Financial Resource Strain: Not on file  Food Insecurity: Not on file  Transportation Needs: Not on file  Physical Activity: Not on file  Stress: Not on file  Social Connections: Not on file  Intimate Partner Violence: Not on file    Family History:    Family  History  Problem Relation Age of Onset   Emphysema Mother  Cerebrovascular Accident Father    Heart disease Father    Diabetes Sister    Heart disease Brother    Heart disease Sister    Heart disease Brother    Diabetes Brother      ROS:  Please see the history of present illness.   All other ROS reviewed and negative.     Physical Exam/Data:   Vitals:   04/13/21 0119 04/13/21 0423 04/13/21 0500 04/13/21 1334  BP: 106/70 119/66  (!) 101/55  Pulse: 99 (!) 104  (!) 104  Resp: 17 15  16   Temp: 97.8 F (36.6 C) 97.6 F (36.4 C)  98 F (36.7 C)  TempSrc: Oral Oral  Oral  SpO2: 96% 97%  99%  Weight:   89.1 kg   Height:        Intake/Output Summary (Last 24 hours) at 04/13/2021 1436 Last data filed at 04/13/2021 0400 Gross per 24 hour  Intake 120 ml  Output 400 ml  Net -280 ml   Last 3 Weights 04/13/2021 04/12/2021 04/08/2021  Weight (lbs) 196 lb 6.4 oz 196 lb 13.9 oz 196 lb 12.8 oz  Weight (kg) 89.086 kg 89.3 kg 89.268 kg     Body mass index is 29 kg/m.  General:  Elderly male, comfortable HEENT: normal Neck: JVD to base of the neck at 90 degrees Vascular: No carotid bruits; Distal pulses 2+ bilaterally Cardiac:  Irregularly irregular, tachycardic, no murmus Lungs:  Diminished at the bases (R>L); otherwise clear Abd: soft, mildly distended Ext: Warm, tense 1+ edema Musculoskeletal:  No deformities, BUE and BLE strength normal and equal Skin: warm and dry  Neuro:  CNs 2-12 intact, no focal abnormalities noted Psych:  Normal affect   EKG:  The EKG was personally reviewed and demonstrates: Atrial fibrillation with RVR, heart rate 118.  Telemetry:  Telemetry was personally reviewed and demonstrates:  Atrial fibrillation, HR in 120's with occasional PVC's.   Relevant CV Studies:  Echocardiogram: 03/2020 IMPRESSIONS     1. Left ventricular ejection fraction, by estimation, is 60 to 65%. The  left ventricle has normal function. The left ventricle has no regional   wall motion abnormalities. There is mild left ventricular hypertrophy.  Left ventricular diastolic parameters  are indeterminate.   2. Right ventricular systolic function is normal. The right ventricular  size is normal. There is mildly elevated pulmonary artery systolic  pressure.   3. Left atrial size was severely dilated.   4. The mitral valve is normal in structure. Mild mitral valve  regurgitation. No evidence of mitral stenosis.   5. The aortic valve is tricuspid. Aortic valve regurgitation is not  visualized. No aortic stenosis is present.   6. The inferior vena cava is dilated in size with >50% respiratory  variability, suggesting right atrial pressure of 8 mmHg.   Laboratory Data:  High Sensitivity Troponin:   Recent Labs  Lab 03/30/21 1039 03/30/21 1232 04/12/21 1234 04/12/21 1619  TROPONINIHS 13 14 11 12      Chemistry Recent Labs  Lab 04/08/21 1109 04/12/21 1234 04/13/21 0508  NA 140 136 139  K 4.4 4.0 3.4*  CL 109 104 104  CO2 26 22 26   GLUCOSE 164* 163* 121*  BUN 15 15 16   CREATININE 1.43* 1.34* 1.39*  CALCIUM 8.5* 8.7* 9.2  GFRNONAA  --  54* 51*  ANIONGAP  --  10 9    Recent Labs  Lab 04/08/21 1109 04/12/21 1234  PROT 6.2 6.3*  ALBUMIN  --  2.7*  AST 24 26  ALT 18 19  ALKPHOS  --  126  BILITOT 2.1* 2.8*   Lipids No results for input(s): CHOL, TRIG, HDL, LABVLDL, LDLCALC, CHOLHDL in the last 168 hours.  Hematology Recent Labs  Lab 04/12/21 1234  WBC 4.8  RBC 3.45*  HGB 10.8*  HCT 33.5*  MCV 97.1  MCH 31.3  MCHC 32.2  RDW 19.4*  PLT 121*   Thyroid No results for input(s): TSH, FREET4 in the last 168 hours.  BNP Recent Labs  Lab 04/12/21 1234  BNP 421.0*    DDimer No results for input(s): DDIMER in the last 168 hours.   Radiology/Studies:  CT Angio Chest PE W and/or Wo Contrast  Result Date: 04/12/2021 CLINICAL DATA:  Pulmonary embolism suspected, high probability. Shortness of breath since last night. EXAM: CT ANGIOGRAPHY  CHEST WITH CONTRAST TECHNIQUE: Multidetector CT imaging of the chest was performed using the standard protocol during bolus administration of intravenous contrast. Multiplanar CT image reconstructions and MIPs were obtained to evaluate the vascular anatomy. RADIATION DOSE REDUCTION: This exam was performed according to the departmental dose-optimization program which includes automated exposure control, adjustment of the mA and/or kV according to patient size and/or use of iterative reconstruction technique. CONTRAST:  57mL OMNIPAQUE IOHEXOL 350 MG/ML SOLN COMPARISON:  Radiographs same date. Abdominopelvic CT 02/12/2021. No prior chest CTs are currently available for correlation. FINDINGS: Cardiovascular: The pulmonary arteries are well opacified with contrast to the level of the subsegmental branches. There is no evidence of acute pulmonary embolism. There is atherosclerosis of the aorta, great vessels and coronary arteries. There is limited systemic arterial opacification without evidence of acute vascular abnormality. The heart size is normal. There is no pericardial effusion. Mediastinum/Nodes: There are no enlarged mediastinal, hilar or axillary lymph nodes.Mild nonspecific esophageal wall thickening without significant distension. The thyroid gland and trachea demonstrate no significant findings. Lungs/Pleura: Moderate-sized dependent pleural effusions bilaterally. Evidence of underlying at least moderate centrilobular emphysema with diffuse central airway thickening. There is scattered interstitial opacities throughout both lungs and mild dependent atelectasis, but no confluent airspace opacity or suspicious nodularity. Upper abdomen: TIPS shunt noted in the liver. Patency of this shunt cannot be confirmed on this examination due to limited portal vein opacification. Underlying morphologic changes of cirrhosis and vascular embolization clips are noted. Musculoskeletal/Chest wall: There is no chest wall mass  or suspicious osseous finding. Mild degenerative changes in the spine. Bilateral gynecomastia. Review of the MIP images confirms the above findings. IMPRESSION: 1. No evidence of acute pulmonary embolism or other acute vascular findings in the chest. 2. Moderate-sized pleural effusions with diffuse interstitial opacities superimposed on emphysema, suspicious for congestive heart failure. Recommend chest radiographic follow-up. 3. Coronary and Aortic Atherosclerosis (ICD10-I70.0). Emphysema (ICD10-J43.9). 4. Patency of the hepatic TIPS shunt cannot be addressed by this examination. Electronically Signed   By: Richardean Sale M.D.   On: 04/12/2021 16:16   DG Chest Port 1 View  Result Date: 04/12/2021 CLINICAL DATA:  Shortness of breath EXAM: PORTABLE CHEST 1 VIEW COMPARISON:  02/20/2021, 01/27/2021 FINDINGS: Mild cardiomegaly. Atherosclerotic calcification of the aortic knob. Persistently increased interstitial markings throughout both lungs. Interval development of a small left pleural effusion. Streaky left basilar opacity. No pneumothorax. IMPRESSION: 1. Interval development of a small left pleural effusion with adjacent left basilar atelectasis/consolidation. 2. Persistently increased interstitial markings throughout both lungs may reflect underlying interstitial lung disease or edema. Electronically Signed   By: Davina Poke D.O.   On: 04/12/2021 12:42  Assessment and Plan:   #Acute on Chronic Diastolic Heart Failure: Patient presents with worsening LE edema, abdominal distension and weight gain in the setting of Afib with RVR, cirrhosis, and being off diuretics. BNP 421 on admission. Trop negative and flat. CTA with bilateral pleural effusions. Currently responding well to IV diuresis. -Continue lasix 40mg  IV BID -Resume coreg 3.125mg  BID and uptitrate as tolerated for rate control -Follow-up TTE -Will hold ARB/spiro for now given soft blood pressures; add as able -Monitor I/Os and daily  weights -Low Na diet  #Paroxysmal Afib: CHADs-vasc 6. Not on AC due to history of GIB and esophageal varices in the setting of cirrhosis. On rate control with coreg and is not a candidate for DCCV due to inability to tolerated AC. Currently HR poorly controlled in the 120-130s. Will resume home BB. -Resume coreg 3.125mg  BID and can up-titrate as tolerated for better HR control -Not on AC due to history of GIB and esophageal varices -Follow-up TTE  #HTN: Soft blood pressures currently. Holding home lisinopril for now. -Continue coreg as above -Hold lisinopril as need BP room to up-titrate BB for better HR control  #HLD: -Continue home simvastatin  #NASH Cirrhosis: NASH cirrhosis complicated by nonbleeding grade 2 esophageal varices s/p banding, ascites, hx of bleeding gastric AVM, PUD s/p Billroth II gastrectomy, and portal vein trhombosis s/p TIPS, partial thrombectomy and coil embolization of splenorenal shunt. -Management per GI and primary  Risk Assessment/Risk Scores:    CHA2DS2-VASc Score = 6   This indicates a 9.7% annual risk of stroke. The patient's score is based upon: CHF History: 1 HTN History: 1 Diabetes History: 1 Stroke History: 0 Vascular Disease History: 1 Age Score: 2 Gender Score: 0      For questions or updates, please contact [facility] Please consult [email or web address] for contact info under    Signed, Freada Bergeron, MD  04/13/2021 2:36 PM

## 2021-04-13 NOTE — Progress Notes (Signed)
PROGRESS NOTE    Tyler Farmer  VZD:[id number] DOB: 1940-08-24 DOA: 04/12/2021 PCP: [name], MD   Brief Narrative:  This 81 years old male with PMH significant for liver cirrhosis with hepatic encephalopathy, COPD, diabetes mellitus, hypertension, congestive heart failure, atrial fibrillation presented in the ED with complaints of difficulty breathing, abdominal swelling and leg swelling that started about a month ago which has been progressive.  Patient used to be taking Lasix 20 mg daily and spironolactone 50 mg daily but it was discontinued due to concerns about his abnormal kidney functions.  In the ED he was severely short of breath, BNP 421, CTA negative for PE but findings concerning for congestive heart failure.  EKG shows atrial fibrillation.  Patient is admitted for decompensated heart failure.  Assessment & Plan:   Principal Problem:   Acute diastolic CHF (congestive heart failure) (HCC) Active Problems:   Liver cirrhosis secondary to NASH (HCC)   COPD (chronic obstructive pulmonary disease) (HCC)   Encephalopathy, hepatic   Benign essential hypertension   Diabetes ([place])   Cirrhosis of liver (HCC)  Acute diastolic CHF: Patient presented with weight gain, leg swelling, abdominal bloating and shortness of breath. CTA chest negative for PE but showed moderate pleural effusion and interstitial opacities. BNP 421, no prior BNP to compare.  Patient is not hypoxic. Patient reports he has gained 20 pounds since December. Last echocardiogram LVEF 60 to 65% with mild LVH.  Indeterminant LV diastolic parameters. Troponin 11> 12 Continue IV Lasix 40 mg every 12 hours. Monitor daily weight, intake output charting.  Intake/Output Summary (Last 24 hours) at 04/13/2021 1209 Last data filed at 04/13/2021 0400 Gross per 24 hour  Intake 120 ml  Output 400 ml  Net -280 ml     Liver cirrhosis: History of hepatic encephalopathy Remains stable.  Continue lactulose twice  daily. Resume spironolactone and Lasix.  Atrial fibrillation: Continue Coreg.  Heart rate is controlled He is not on anticoagulation due to history of varices and bleeding.  Essential hypertension: Continue carvedilol. Hold lisinopril with contrast exposure. Resume in the morning tomorrow  Type 2 diabetes: Hemoglobin A1c 6.8. Hold home glipizide.   Regular insulin sliding scale.  COPD : Stable. Continue home inhalers.  Hypothyroidism: Continue levothyroxine.  Hyperlipidemia: Continue simvastatin.  DVT prophylaxis: Lovenox Code Status: Full code. Family Communication: No family at bed side. Disposition Plan:   Status is: Inpatient  Remains inpatient appropriate because:  Admitted for CHF exacerbation requiring IV diuresis.  Consultants:  Cardiology  Procedures: Echocardiogram Antimicrobials: None  Subjective: Patient was seen and examined at bedside.  Overnight events noted.   Patient reports feeling much better he wants to be discharged.   He still has some shortness of breath while ambulation.  Objective: Vitals:   04/12/21 2336 04/13/21 0119 04/13/21 0423 04/13/21 0500  BP: 114/68 106/70 119/66   Pulse: 100 99 (!) 104   Resp: 19 17 15    Temp:  97.8 F (36.6 C) 97.6 F (36.4 C)   TempSrc:  Oral Oral   SpO2: 97% 96% 97%   Weight:    89.1 kg  Height:        Intake/Output Summary (Last 24 hours) at 04/13/2021 1209 Last data filed at 04/13/2021 0400 Gross per 24 hour  Intake 120 ml  Output 400 ml  Net -280 ml   Filed Weights   04/12/21 2000 04/13/21 0500  Weight: 89.3 kg 89.1 kg    Examination:  General exam: Appears comfortable, not in  any acute distress. Respiratory system: Clear to auscultation bilaterally, no wheezing, no crackles.  RR 15 Cardiovascular system: S1-S2 heard, regular rate , Irregular rhythm, no murmur. Gastrointestinal system: Abdomen is soft, nontender, nondistended, BS+ Central nervous system: Alert and oriented x 3. No  focal neurological deficits. Extremities: Edema++, no cyanosis, no clubbing. Skin: No rashes, lesions or ulcers Psychiatry: Judgement and insight appear normal. Mood & affect appropriate.     Data Reviewed: I have personally reviewed following labs and imaging studies  CBC: Recent Labs  Lab 04/12/21 1234  WBC 4.8  NEUTROABS 3.0  HGB 10.8*  HCT 33.5*  MCV 97.1  PLT 976*   Basic Metabolic Panel: Recent Labs  Lab 04/08/21 1109 04/12/21 1234 04/13/21 0508  NA 140 136 139  K 4.4 4.0 3.4*  CL 109 104 104  CO2 26 22 26   GLUCOSE 164* 163* 121*  BUN 15 15 16   CREATININE 1.43* 1.34* 1.39*  CALCIUM 8.5* 8.7* 9.2   GFR: Estimated Creatinine Clearance: 46.8 mL/min (A) (by C-G formula based on SCr of 1.39 mg/dL (H)). Liver Function Tests: Recent Labs  Lab 04/08/21 1109 04/12/21 1234  AST 24 26  ALT 18 19  ALKPHOS  --  126  BILITOT 2.1* 2.8*  PROT 6.2 6.3*  ALBUMIN  --  2.7*   No results for input(s): LIPASE, AMYLASE in the last 168 hours. No results for input(s): AMMONIA in the last 168 hours. Coagulation Profile: No results for input(s): INR, PROTIME in the last 168 hours. Cardiac Enzymes: No results for input(s): CKTOTAL, CKMB, CKMBINDEX, TROPONINI in the last 168 hours. BNP (last 3 results) No results for input(s): PROBNP in the last 8760 hours. HbA1C: No results for input(s): HGBA1C in the last 72 hours. CBG: Recent Labs  Lab 04/12/21 2158 04/13/21 0729 04/13/21 1129  GLUCAP 175* 127* 142*   Lipid Profile: No results for input(s): CHOL, HDL, LDLCALC, TRIG, CHOLHDL, LDLDIRECT in the last 72 hours. Thyroid Function Tests: No results for input(s): TSH, T4TOTAL, FREET4, T3FREE, THYROIDAB in the last 72 hours. Anemia Panel: No results for input(s): VITAMINB12, FOLATE, FERRITIN, TIBC, IRON, RETICCTPCT in the last 72 hours. Sepsis Labs: No results for input(s): PROCALCITON, LATICACIDVEN in the last 168 hours.  Recent Results (from the past 240 hour(s))   Resp Panel by RT-PCR (Flu A&B, Covid) Nasopharyngeal Swab     Status: None   Collection Time: 04/12/21  6:47 PM   Specimen: Nasopharyngeal Swab; Nasopharyngeal(NP) swabs in vial transport medium  Result Value Ref Range Status   SARS Coronavirus 2 by RT PCR NEGATIVE NEGATIVE Final    Comment: (NOTE) SARS-CoV-2 target nucleic acids are NOT DETECTED.  The SARS-CoV-2 RNA is generally detectable in upper respiratory specimens during the acute phase of infection. The lowest concentration of SARS-CoV-2 viral copies this assay can detect is 138 copies/mL. A negative result does not preclude SARS-Cov-2 infection and should not be used as the sole basis for treatment or other patient management decisions. A negative result may occur with  improper specimen collection/handling, submission of specimen other than nasopharyngeal swab, presence of viral mutation(s) within the areas targeted by this assay, and inadequate number of viral copies(<138 copies/mL). A negative result must be combined with clinical observations, patient history, and epidemiological information. The expected result is Negative.  Fact Sheet for Patients:  EntrepreneurPulse.com.au  Fact Sheet for Healthcare Providers:  IncredibleEmployment.be  This test is no t yet approved or cleared by the [place] and  has been authorized for  detection and/or diagnosis of SARS-CoV-2 by FDA under an Emergency Use Authorization (EUA). This EUA will remain  in effect (meaning this test can be used) for the duration of the COVID-19 declaration under Section 564(b)(1) of the Act, 21 U.S.C.section 360bbb-3(b)(1), unless the authorization is terminated  or revoked sooner.       Influenza A by PCR NEGATIVE NEGATIVE Final   Influenza B by PCR NEGATIVE NEGATIVE Final    Comment: (NOTE) The Xpert Xpress SARS-CoV-2/FLU/RSV plus assay is intended as an aid in the diagnosis of influenza from  Nasopharyngeal swab specimens and should not be used as a sole basis for treatment. Nasal washings and aspirates are unacceptable for Xpert Xpress SARS-CoV-2/FLU/RSV testing.  Fact Sheet for Patients: EntrepreneurPulse.com.au  Fact Sheet for Healthcare Providers: IncredibleEmployment.be  This test is not yet approved or cleared by the [place] FDA and has been authorized for detection and/or diagnosis of SARS-CoV-2 by FDA under an Emergency Use Authorization (EUA). This EUA will remain in effect (meaning this test can be used) for the duration of the COVID-19 declaration under Section 564(b)(1) of the Act, 21 U.S.C. section 360bbb-3(b)(1), unless the authorization is terminated or revoked.  Performed at [facility], [address], [place], [place] [address]     Radiology Studies: CT Angio Chest PE W and/or Wo Contrast  Result Date: 04/12/2021 CLINICAL DATA:  Pulmonary embolism suspected, high probability. Shortness of breath since last night. EXAM: CT ANGIOGRAPHY CHEST WITH CONTRAST TECHNIQUE: Multidetector CT imaging of the chest was performed using the standard protocol during bolus administration of intravenous contrast. Multiplanar CT image reconstructions and MIPs were obtained to evaluate the vascular anatomy. RADIATION DOSE REDUCTION: This exam was performed according to the departmental dose-optimization program which includes automated exposure control, adjustment of the mA and/or kV according to patient size and/or use of iterative reconstruction technique. CONTRAST:  51mL OMNIPAQUE IOHEXOL 350 MG/ML SOLN COMPARISON:  Radiographs same date. Abdominopelvic CT 02/12/2021. No prior chest CTs are currently available for correlation. FINDINGS: Cardiovascular: The pulmonary arteries are well opacified with contrast to the level of the subsegmental branches. There is no evidence of acute pulmonary embolism. There is atherosclerosis of the aorta,  great vessels and coronary arteries. There is limited systemic arterial opacification without evidence of acute vascular abnormality. The heart size is normal. There is no pericardial effusion. Mediastinum/Nodes: There are no enlarged mediastinal, hilar or axillary lymph nodes.Mild nonspecific esophageal wall thickening without significant distension. The thyroid gland and trachea demonstrate no significant findings. Lungs/Pleura: Moderate-sized dependent pleural effusions bilaterally. Evidence of underlying at least moderate centrilobular emphysema with diffuse central airway thickening. There is scattered interstitial opacities throughout both lungs and mild dependent atelectasis, but no confluent airspace opacity or suspicious nodularity. Upper abdomen: TIPS shunt noted in the liver. Patency of this shunt cannot be confirmed on this examination due to limited portal vein opacification. Underlying morphologic changes of cirrhosis and vascular embolization clips are noted. Musculoskeletal/Chest wall: There is no chest wall mass or suspicious osseous finding. Mild degenerative changes in the spine. Bilateral gynecomastia. Review of the MIP images confirms the above findings. IMPRESSION: 1. No evidence of acute pulmonary embolism or other acute vascular findings in the chest. 2. Moderate-sized pleural effusions with diffuse interstitial opacities superimposed on emphysema, suspicious for congestive heart failure. Recommend chest radiographic follow-up. 3. Coronary and Aortic Atherosclerosis (ICD10-I70.0). Emphysema (ICD10-J43.9). 4. Patency of the hepatic TIPS shunt cannot be addressed by this examination. Electronically Signed   By: Caryl Comes.D.  On: 04/12/2021 16:16   DG Chest Port 1 View  Result Date: 04/12/2021 CLINICAL DATA:  Shortness of breath EXAM: PORTABLE CHEST 1 VIEW COMPARISON:  02/20/2021, 01/27/2021 FINDINGS: Mild cardiomegaly. Atherosclerotic calcification of the aortic knob. Persistently  increased interstitial markings throughout both lungs. Interval development of a small left pleural effusion. Streaky left basilar opacity. No pneumothorax. IMPRESSION: 1. Interval development of a small left pleural effusion with adjacent left basilar atelectasis/consolidation. 2. Persistently increased interstitial markings throughout both lungs may reflect underlying interstitial lung disease or edema. Electronically Signed   By: Davina Poke D.O.   On: 04/12/2021 12:42    Scheduled Meds:  aspirin EC  81 mg Oral Q breakfast   enoxaparin (LOVENOX) injection  40 mg Subcutaneous Q24H   furosemide  40 mg Intravenous BID   insulin aspart  0-5 Units Subcutaneous QHS   insulin aspart  0-9 Units Subcutaneous TID WC   lactulose  30 g Oral BID   levothyroxine  137 mcg Oral QAC breakfast   pantoprazole  40 mg Oral BID   Continuous Infusions:   LOS: 1 day    Time spent: 50 mins    Arlis Everly, MD [facility]   If 7PM-7AM, please contact night-coverage

## 2021-04-13 NOTE — TOC Initial Note (Signed)
Transition of Care [facility]) - Initial/Assessment Note    Patient Details  Name: Tyler Farmer MRN: [id number] Date of Birth: February 12, 1941  [facility]) CM/SW Contact:    Iona Beard, [place] Phone Number: 04/13/2021, 1:12 PM  Clinical Narrative:                 Pt is high risk for readmission. CSW spoke with pts wife to complete assessment. Pt lives with his wife. Pt is independent in completing ADLs. PT is able to drive when needed. Pt has not had [place] services in the past. Pt dos not use any DME in the home.   TOC consulted for CHF screen with pt. CSW spoke with pts wife to complete screen. Pt weighs himself daily. Pts wife states that pt eat good and tries to follow a heart healthy diet. Pts wife makes sure that all meds are out for pt daily and he takes them as prescribed. Pts cardiologist is Dr. Harl Bowie. TOC to follow for possible D/C needs.   Expected Discharge Plan: Home/Self Care Barriers to Discharge: Continued Medical Work up   Patient Goals and CMS Choice Patient states their goals for this hospitalization and ongoing recovery are:: Return home CMS Medicare.gov Compare Post Acute Care list provided to:: Patient Choice offered to / list presented to : Patient, Spouse  Expected Discharge Plan and Services Expected Discharge Plan: Home/Self Care In-house Referral: Clinical Social Work Discharge Planning Services: CM Consult   Living arrangements for the past 2 months: Single Family Home                                      Prior Living Arrangements/Services Living arrangements for the past 2 months: Single Family Home Lives with:: Spouse Patient language and need for interpreter reviewed:: Yes Do you feel safe going back to the place where you live?: Yes      Need for Family Participation in Patient Care: Yes (Comment) Care giver support system in place?: Yes (comment)   Criminal Activity/Legal Involvement Pertinent to Current  Situation/Hospitalization: No - Comment as needed  Activities of Daily Living Home Assistive Devices/Equipment: Eyeglasses ADL Screening (condition at time of admission) Patient's cognitive ability adequate to safely complete daily activities?: Yes Is the patient deaf or have difficulty hearing?: Yes Does the patient have difficulty seeing, even when wearing glasses/contacts?: Yes Does the patient have difficulty concentrating, remembering, or making decisions?: Yes Patient able to express need for assistance with ADLs?: Yes Does the patient have difficulty dressing or bathing?: Yes Independently performs ADLs?: No Communication: Independent Dressing (OT): Needs assistance Is this a change from baseline?: Pre-admission baseline Grooming: Needs assistance, Independent Feeding: Needs assistance Is this a change from baseline?: Pre-admission baseline Bathing: Independent In/Out Bed: Needs assistance Is this a change from baseline?: Pre-admission baseline Walks in Home: Independent Does the patient have difficulty walking or climbing stairs?: Yes Weakness of Legs: Both Weakness of Arms/Hands: Both  Permission Sought/Granted                  Emotional Assessment Appearance:: Appears stated age Attitude/Demeanor/Rapport: Engaged Affect (typically observed): Accepting Orientation: : Oriented to Self, Oriented to Place, Oriented to  Time, Oriented to Situation Alcohol / Substance Use: Not Applicable Psych Involvement: No (comment)  Admission diagnosis:  SOB (shortness of breath) [R06.02] Acute CHF (HCC) [I50.9] Acute on chronic congestive heart failure, unspecified heart failure type (  [place]) [I50.9] Patient Active Problem List   Diagnosis Date Noted   Acute diastolic CHF (congestive heart failure) ([place]) 04/12/2021   S/P TIPS (transjugular intrahepatic portosystemic shunt) 03/05/2021   Encephalopathy, hepatic 02/19/2021   Portal vein thrombosis 02/12/2021   Elevated brain  natriuretic peptide (BNP) level 01/27/2021   AKI (acute kidney injury) ([place]) 01/27/2021   Chest pain 01/27/2021   Abdominal pain 01/27/2021   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   Portal hypertension ([place]) 01/27/2021   New onset of congestive heart failure ([place]) 01/27/2021   UGI bleed 10/26/2016   Vitamin B12 deficiency 10/26/2016   AVM (arteriovenous malformation) 10/25/2016   Gastric ulcer    Symptomatic anemia    Dizziness and giddiness 10/12/2016   Hemorrhage of rectum and anus 10/12/2016   Routine general medical examination at a health care facility 09/22/2016   Incisional hernia 05/19/2016   Iron deficiency anemia due to chronic blood loss 05/19/2016   Cirrhosis of liver ([place]) 05/19/2016   Esophageal varices ([place]) 05/19/2016   Body mass index 32.0-32.9, adult 05/19/2016   Anemia 05/16/2016   IDA (iron deficiency anemia) 10/23/2015   Heme positive stool 10/23/2015   Need for prophylactic vaccination and inoculation against single bacterial disease 04/15/2015   Hyperlipidemia 05/27/2014   HTN (hypertension) 05/27/2014   DM (diabetes mellitus) ([place]) 05/27/2014   Liver cirrhosis secondary to NASH ([place]) 05/27/2014   COPD (chronic obstructive pulmonary disease) ([place]) 05/27/2014   Acute cystitis 04/24/2014   Benign essential hypertension 10/04/2013   Hypothyroidism 10/04/2013   Pure hypercholesterolemia 10/04/2013   Other emphysema ([place]) 02/01/2013   Malignant neoplasm of prostate ([place]) 02/01/2013   Diabetes ([place]) 01/08/2013   Prostate cancer ([place]) 01/17/2012   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   Need for prophylactic vaccination and inoculation against influenza 01/17/2012   Obesity 08/20/2010   Thyroid disease 08/20/2010   Type 2 or unspecified type diabetes mellitus, uncontrolled 08/20/2010   Hypercholesterolemia 08/20/2010   PCP:  [name], MD Pharmacy:   [facility] [name] [place] [address] Phone:  [phone] Fax: [phone]     Social Determinants of Health (SDOH) Interventions    Readmission Risk Interventions Readmission Risk Prevention Plan 04/13/2021  Transportation Screening Complete  HRI or Home Care Consult Complete  Social Work Consult for [place] Planning/Counseling Complete  Palliative Care Screening Not Applicable  Medication Review Press photographer) Complete  Some recent data might be hidden

## 2021-04-14 ENCOUNTER — Telehealth: Payer: Medicare Other

## 2021-04-14 DIAGNOSIS — I4891 Unspecified atrial fibrillation: Secondary | ICD-10-CM

## 2021-04-14 LAB — CBC
HCT: 33.2 % — ABNORMAL LOW (ref 39.0–52.0)
Hemoglobin: 10.9 g/dL — ABNORMAL LOW (ref 13.0–17.0)
MCH: 31 pg (ref 26.0–34.0)
MCHC: 32.8 g/dL (ref 30.0–36.0)
MCV: 94.3 fL (ref 80.0–100.0)
Platelets: 120 10*3/uL — ABNORMAL LOW (ref 150–400)
RBC: 3.52 MIL/uL — ABNORMAL LOW (ref 4.22–5.81)
RDW: 19.2 % — ABNORMAL HIGH (ref 11.5–15.5)
WBC: 4 10*3/uL (ref 4.0–10.5)
nRBC: 0 % (ref 0.0–0.2)

## 2021-04-14 LAB — BASIC METABOLIC PANEL
Anion gap: 10 (ref 5–15)
BUN: 16 mg/dL (ref 8–23)
CO2: 26 mmol/L (ref 22–32)
Calcium: 9 mg/dL (ref 8.9–10.3)
Chloride: 100 mmol/L (ref 98–111)
Creatinine, Ser: 1.49 mg/dL — ABNORMAL HIGH (ref 0.61–1.24)
GFR, Estimated: 47 mL/min — ABNORMAL LOW (ref >60)
Glucose, Bld: 120 mg/dL — ABNORMAL HIGH (ref 70–99)
Potassium: 3.3 mmol/L — ABNORMAL LOW (ref 3.5–5.1)
Sodium: 136 mmol/L (ref 135–145)

## 2021-04-14 LAB — PHOSPHORUS: Phosphorus: 4.1 mg/dL (ref 2.5–4.6)

## 2021-04-14 LAB — GLUCOSE, CAPILLARY
Glucose-Capillary: 124 mg/dL — ABNORMAL HIGH (ref 70–99)
Glucose-Capillary: 131 mg/dL — ABNORMAL HIGH (ref 70–99)

## 2021-04-14 LAB — MAGNESIUM
Magnesium: 1.4 mg/dL — ABNORMAL LOW (ref 1.7–2.4)
Magnesium: 1.8 mg/dL (ref 1.7–2.4)

## 2021-04-14 MED ORDER — METOPROLOL TARTRATE 25 MG PO TABS
25.0000 mg | ORAL_TABLET | Freq: Two times a day (BID) | ORAL | Status: DC
  Administered 2021-04-14: 12:00:00 25 mg via ORAL
  Filled 2021-04-14: qty 1

## 2021-04-14 MED ORDER — POTASSIUM CHLORIDE CRYS ER 20 MEQ PO TBCR
40.0000 meq | EXTENDED_RELEASE_TABLET | Freq: Once | ORAL | Status: AC
  Administered 2021-04-14: 12:00:00 40 meq via ORAL
  Filled 2021-04-14: qty 2

## 2021-04-14 MED ORDER — METOPROLOL TARTRATE 25 MG PO TABS: 25.0000 mg | ORAL_TABLET | Freq: Two times a day (BID) | ORAL | 0 refills | Status: DC

## 2021-04-14 MED ORDER — FUROSEMIDE 20 MG PO TABS: 20.0000 mg | ORAL_TABLET | Freq: Every day | ORAL | 0 refills | Status: DC

## 2021-04-14 MED ORDER — MAGNESIUM SULFATE 2 GM/50ML IV SOLN
2.0000 g | Freq: Once | INTRAVENOUS | Status: AC
  Administered 2021-04-14: 12:00:00 2 g via INTRAVENOUS
  Filled 2021-04-14: qty 50

## 2021-04-14 MED ORDER — FUROSEMIDE 20 MG PO TABS: 20.0000 mg | ORAL_TABLET | Freq: Every day | ORAL | Status: DC

## 2021-04-14 NOTE — Care Management Important Message (Signed)
Important Message  Patient Details  Name: Tyler Farmer MRN: [id number] Date of Birth: Apr 22, 1940   Medicare Important Message Given:  N/A - LOS <3 / Initial given by admissions     [name] 04/14/2021, 12:17 PM

## 2021-04-14 NOTE — Progress Notes (Signed)
Patient ambulated in hallway on room air sating at 96% the entire walk, pt denies any SOB.

## 2021-04-14 NOTE — Progress Notes (Signed)
OT Cancellation Note  Patient Details Name: Tyler Farmer MRN: [id number] DOB: 1940-06-18   Cancelled Treatment:    Reason Eval/Treat Not Completed: OT screened, no needs identified, will sign off. Per chart review, nursing states pt is ambulating in the room by himself and is independent for ADL's. Please send a new order if pt status changes.   Miyeko Mahlum OT, MOT   Larey Seat 04/14/2021, 8:13 AM

## 2021-04-14 NOTE — Discharge Summary (Signed)
Physician Discharge Summary  Patient ID: Tyler Farmer MRN: [id number] DOB/AGE: [birth year] 81 y.o.  Admit date: 04/12/2021 Discharge date: 04/14/2021  Admission Diagnoses:  Discharge Diagnoses:  Principal Problem:   Acute diastolic CHF (congestive heart failure) ([place]) Active Problems:   Liver cirrhosis secondary to NASH (H[facility])   COPD (chronic obstructive pulmonary disease) (H[facility])   Encephalopathy, hepatic   Benign essential hypertension   Diabetes ([place])   Cirrhosis of liver ([place])   Discharged Condition: stable  Hospital Course: This 81 years old male with PMH significant for liver cirrhosis with hepatic encephalopathy, COPD, diabetes mellitus, hypertension, congestive heart failure, atrial fibrillation presented in the ED with complaints of difficulty breathing, abdominal swelling and leg swelling that started about a month ago which has been progressive.  Patient used to be taking Lasix 20 mg daily and spironolactone 50 mg daily but it was discontinued due to concerns about his abnormal kidney functions.  In the ED he was severely short of breath, BNP 421, CTA negative for PE but findings concerning for congestive heart failure.  EKG shows atrial fibrillation.  Patient is admitted for decompensated heart failure.  Patient's Lasix as an outpatient have been stopped approximately 2 weeks ago due to chronic kidney disease.  Patient was diuresed diuresed well symptomatically improved.  Cardiology was consulted also and changed up his medications we were holding his ACE inhibitor and his Coreg has been changed to metoprolol and we have added back on Lasix 20 mg a day.  He needs to follow-up with his primary care physician in 1 to 2 weeks at which point I would repeat a BMP to follow his renal function.  Creatinine has been around 1.4.  He is also follow-up cardiology in 2 to 4 weeks.  At the time of discharge patient was on room air ambulating and doing well back to his baseline status.  We have  set up home health and physical therapy for him at home.   Acute diastolic CHF: Patient presented with weight gain, leg swelling, abdominal bloating and shortness of breath. CTA chest negative for PE but showed moderate pleural effusion and interstitial opacities. BNP 421, no prior BNP to compare.  Patient is not hypoxic. Patient reports he has gained 20 pounds since December. Last echocardiogram LVEF 60 to 65% with mild LVH.  Indeterminant LV diastolic parameters. Troponin 11> 12  IV Lasix 40 mg every 12 hours.  Change to 20 mg p.o. daily at discharge    Liver cirrhosis: History of hepatic encephalopathy Remains stable.  Continue lactulose twice daily. Resume spironolactone and Lasix.   Atrial fibrillation: Coreg switched to metoprolol by cardiology He is not on anticoagulation due to history of varices and bleeding.   Essential hypertension: DC Coreg start metoprolol  Continue to hold lisinopril for now    Type 2 diabetes: Hemoglobin A1c 6.8. Resume home meds   COPD : Stable. Continue home inhalers.   Hypothyroidism: Continue levothyroxine.   Hyperlipidemia: Continue simvastatin.  Discharge Exam: Blood pressure 96/63, pulse 89, temperature 97.8 F (36.6 C), temperature source Oral, resp. rate 16, height 5' 9"  (1.753 m), weight 85.6 kg, SpO2 99 %. General appearance: alert and cooperative Resp: clear to auscultation bilaterally Cardio: regular rate and rhythm, S1, S2 normal, no murmur, click, rub or gallop GI: soft, non-tender; bowel sounds normal; no masses,  no organomegaly Extremities: extremities normal, atraumatic, no cyanosis or edema Pulses: 2+ and symmetric Skin: Skin color, texture, turgor normal. No rashes or lesions  Disposition: Discharge disposition:  01-Home or Self Care       Discharge Instructions     Diet - low sodium heart healthy   Complete by: As directed    Discharge instructions   Complete by: As directed    Follow-up with primary  care physician in 1 to 2 weeks  Follow-up with cardiology in 2 weeks   Increase activity slowly   Complete by: As directed       Allergies as of 04/14/2021       Reactions   Penicillins Nausea And Vomiting   Has patient had a PCN reaction causing immediate rash, facial/tongue/throat swelling, SOB or lightheadedness with hypotension: Yes Has patient had a PCN reaction causing severe rash involving mucus membranes or skin necrosis: No Has patient had a PCN reaction that required hospitalization: No Has patient had a PCN reaction occurring within the last 10 years: No If all of the above answers are "NO", then may proceed with Cephalosporin use. Patient states that it also caused pain in his sides.        Medication List     STOP taking these medications    carvedilol 3.125 MG tablet Commonly known as: Coreg   lisinopril 5 MG tablet Commonly known as: ZESTRIL       TAKE these medications    acetaminophen 500 MG tablet Commonly known as: TYLENOL Take 500 mg by mouth every 6 (six) hours as needed for mild pain.   aspirin EC 81 MG tablet Take 1 tablet (81 mg total) by mouth daily with breakfast. Swallow whole.   Cranberry 500 MG Tabs Take 500 mg by mouth daily.   dicyclomine 10 MG capsule Commonly known as: BENTYL Take 10 mg by mouth 2 (two) times daily as needed for spasms.   ferrous sulfate 325 (65 FE) MG tablet Take 325 mg by mouth daily with breakfast.   furosemide 20 MG tablet Commonly known as: LASIX Take 1 tablet (20 mg total) by mouth daily. Start taking on: April 15, 2021   glipiZIDE 10 MG tablet Commonly known as: GLUCOTROL Take 10 mg by mouth 2 (two) times daily before a meal.   lactulose 10 GM/15ML solution Commonly known as: CHRONULAC Take 45 mLs (30 g total) by mouth 2 (two) times daily.   levothyroxine 137 MCG tablet Commonly known as: SYNTHROID Take 137 mcg by mouth daily before breakfast.   magnesium oxide 400 MG tablet Commonly  known as: MAG-OX Take 400 mg by mouth daily.   metoprolol tartrate 25 MG tablet Commonly known as: LOPRESSOR Take 1 tablet (25 mg total) by mouth 2 (two) times daily.   ondansetron 4 MG tablet Commonly known as: ZOFRAN Take 1 tablet (4 mg total) by mouth every 8 (eight) hours as needed for nausea or vomiting.   pantoprazole 40 MG tablet Commonly known as: PROTONIX Take 1 tablet (40 mg total) by mouth 2 (two) times daily.   simvastatin 40 MG tablet Commonly known as: ZOCOR Take 40 mg by mouth at bedtime.         Signed: Brylan Dec A 04/14/2021, 2:29 PM

## 2021-04-14 NOTE — Progress Notes (Addendum)
Progress Note  Patient Name: Tyler Farmer Date of Encounter: 04/14/2021  [facility] Cardiologist: Carlyle Dolly, MD   Subjective   Breathing improved. Feels almost back to normal. No orthopnea or PND overnight. No chest pain or palpitations.   Inpatient Medications    Scheduled Meds:  aspirin EC  81 mg Oral Q breakfast   carvedilol  3.125 mg Oral BID   enoxaparin (LOVENOX) injection  40 mg Subcutaneous Q24H   furosemide  40 mg Intravenous BID   insulin aspart  0-5 Units Subcutaneous QHS   insulin aspart  0-9 Units Subcutaneous TID WC   lactulose  30 g Oral BID   levothyroxine  137 mcg Oral QAC breakfast   pantoprazole  40 mg Oral BID   simvastatin  40 mg Oral QHS    PRN Meds: acetaminophen **OR** acetaminophen, ondansetron **OR** ondansetron (ZOFRAN) IV   Vital Signs    Vitals:   04/13/21 0500 04/13/21 1334 04/13/21 2123 04/14/21 0514  BP:  (!) 101/55 110/61 103/66  Pulse:  (!) 104 (!) 103 98  Resp:  16 20 20   Temp:  98 F (36.7 C) 97.9 F (36.6 C) 97.8 F (36.6 C)  TempSrc:  Oral Oral Oral  SpO2:  99% 98% 97%  Weight: 89.1 kg   85.6 kg  Height:    5' 9"  (1.753 m)    Intake/Output Summary (Last 24 hours) at 04/14/2021 0842 Last data filed at 04/13/2021 1900 Gross per 24 hour  Intake 480 ml  Output --  Net 480 ml   Last 3 Weights 04/14/2021 04/13/2021 04/12/2021  Weight (lbs) 188 lb 12.8 oz 196 lb 6.4 oz 196 lb 13.9 oz  Weight (kg) 85.639 kg 89.086 kg 89.3 kg      Telemetry    Atrial fibrillation, HR mostly in 90's to low-100's. Peaking into 120's to 130's at times.  - Personally Reviewed  ECG    No new tracings.   Physical Exam   GEN: Pleasant elderly male appearing in no acute distress.   Neck: No JVD Cardiac: Irregularly irregular, no murmurs, rubs, or gallops.  Respiratory: Clear to auscultation bilaterally without wheezing or rales. GI: Soft, nontender, non-distended  MS: No pitting edema; No deformity. Neuro:  Nonfocal  Psych:  Normal affect   Labs    High Sensitivity Troponin:   Recent Labs  Lab 03/30/21 1039 03/30/21 1232 04/12/21 1234 04/12/21 1619  TROPONINIHS 13 14 11 12      Chemistry Recent Labs  Lab 04/08/21 1109 04/12/21 1234 04/13/21 0508 04/14/21 0523  NA 140 136 139 136  K 4.4 4.0 3.4* 3.3*  CL 109 104 104 100  CO2 26 22 26 26   GLUCOSE 164* 163* 121* 120*  BUN 15 15 16 16   CREATININE 1.43* 1.34* 1.39* 1.49*  CALCIUM 8.5* 8.7* 9.2 9.0  MG  --   --   --  1.4*  PROT 6.2 6.3*  --   --   ALBUMIN  --  2.7*  --   --   AST 24 26  --   --   ALT 18 19  --   --   ALKPHOS  --  126  --   --   BILITOT 2.1* 2.8*  --   --   GFRNONAA  --  54* 51* 47*  ANIONGAP  --  10 9 10      Hematology Recent Labs  Lab 04/12/21 1234 04/14/21 0523  WBC 4.8 4.0  RBC 3.45* 3.52*  HGB  10.8* 10.9*  HCT 33.5* 33.2*  MCV 97.1 94.3  MCH 31.3 31.0  MCHC 32.2 32.8  RDW 19.4* 19.2*  PLT 121* 120*    BNP Recent Labs  Lab 04/12/21 1234  BNP 421.0*     Radiology    CT Angio Chest PE W and/or Wo Contrast  Result Date: 04/12/2021 CLINICAL DATA:  Pulmonary embolism suspected, high probability. Shortness of breath since last night. EXAM: CT ANGIOGRAPHY CHEST WITH CONTRAST TECHNIQUE: Multidetector CT imaging of the chest was performed using the standard protocol during bolus administration of intravenous contrast. Multiplanar CT image reconstructions and MIPs were obtained to evaluate the vascular anatomy. RADIATION DOSE REDUCTION: This exam was performed according to the departmental dose-optimization program which includes automated exposure control, adjustment of the mA and/or kV according to patient size and/or use of iterative reconstruction technique. CONTRAST:  42m OMNIPAQUE IOHEXOL 350 MG/ML SOLN COMPARISON:  Radiographs same date. Abdominopelvic CT 02/12/2021. No prior chest CTs are currently available for correlation. FINDINGS: Cardiovascular: The pulmonary arteries are well opacified with contrast to  the level of the subsegmental branches. There is no evidence of acute pulmonary embolism. There is atherosclerosis of the aorta, great vessels and coronary arteries. There is limited systemic arterial opacification without evidence of acute vascular abnormality. The heart size is normal. There is no pericardial effusion. Mediastinum/Nodes: There are no enlarged mediastinal, hilar or axillary lymph nodes.Mild nonspecific esophageal wall thickening without significant distension. The thyroid gland and trachea demonstrate no significant findings. Lungs/Pleura: Moderate-sized dependent pleural effusions bilaterally. Evidence of underlying at least moderate centrilobular emphysema with diffuse central airway thickening. There is scattered interstitial opacities throughout both lungs and mild dependent atelectasis, but no confluent airspace opacity or suspicious nodularity. Upper abdomen: TIPS shunt noted in the liver. Patency of this shunt cannot be confirmed on this examination due to limited portal vein opacification. Underlying morphologic changes of cirrhosis and vascular embolization clips are noted. Musculoskeletal/Chest wall: There is no chest wall mass or suspicious osseous finding. Mild degenerative changes in the spine. Bilateral gynecomastia. Review of the MIP images confirms the above findings. IMPRESSION: 1. No evidence of acute pulmonary embolism or other acute vascular findings in the chest. 2. Moderate-sized pleural effusions with diffuse interstitial opacities superimposed on emphysema, suspicious for congestive heart failure. Recommend chest radiographic follow-up. 3. Coronary and Aortic Atherosclerosis (ICD10-I70.0). Emphysema (ICD10-J43.9). 4. Patency of the hepatic TIPS shunt cannot be addressed by this examination. Electronically Signed   By: WRichardean SaleM.D.   On: 04/12/2021 16:16   DG Chest Port 1 View  Result Date: 04/12/2021 CLINICAL DATA:  Shortness of breath EXAM: PORTABLE CHEST 1 VIEW  COMPARISON:  02/20/2021, 01/27/2021 FINDINGS: Mild cardiomegaly. Atherosclerotic calcification of the aortic knob. Persistently increased interstitial markings throughout both lungs. Interval development of a small left pleural effusion. Streaky left basilar opacity. No pneumothorax. IMPRESSION: 1. Interval development of a small left pleural effusion with adjacent left basilar atelectasis/consolidation. 2. Persistently increased interstitial markings throughout both lungs may reflect underlying interstitial lung disease or edema. Electronically Signed   By: NDavina PokeD.O.   On: 04/12/2021 12:42    Cardiac Studies   Echocardiogram: 04/13/2021 IMPRESSIONS    1. Left ventricular ejection fraction, by estimation, is 60 to 65%. The  left ventricle has normal function. The left ventricle has no regional  wall motion abnormalities. There is mild concentric left ventricular  hypertrophy of the basal-septal segment.  Diastolic function indeterminant due to AFib.   2. Right ventricular systolic function is  normal. The right ventricular  size is normal. There is normal pulmonary artery systolic pressure.   3. Left atrial size was severely dilated.   4. Right atrial size was mildly dilated.   5. The mitral valve is grossly normal. Mild to moderate mitral valve  regurgitation. Moderate mitral annular calcification.   6. The aortic valve is tricuspid. There is mild calcification of the  aortic valve. There is mild thickening of the aortic valve. Aortic valve  regurgitation is not visualized. Aortic valve sclerosis/calcification is  present, without any evidence of  aortic stenosis.   7. The inferior vena cava is dilated in size with <50% respiratory  variability, suggesting right atrial pressure of 15 mmHg.   Comparison(s): Compared to prior TTE in 03/2020, there is no significant  change.   Patient Profile     81 y.o. male with a hx of paroxysmal atrial fibrillation (not on anticoagulation  due to GIB and history of varices and appears persistent since 01/2021), Stage 3 CKD, cirrhosis, GERD, hypothyroidism and history of GIB (due to AVM's and esophageal varices) who is currently admitted for an acute CHF exacerbation.   Assessment & Plan    1. HFpEF - Presented with worsening edema and abdominal distension in the setting of being off his diuretics which were previously discontinued by his PCP and also due to atrial fibrillation with RVR and known cirrhosis.  - Echo this admission shows a preserved EF of 60-65% with no regional WMA. Was noted to have severe LA dilation along with mild to moderate MR. - He has been receiving IV Lasix 58m BID with I&O's not recorded but weight has declined from 196 lbs on admission to 188 lbs today (reported dry weight of 180 - 185 lbs). Creatinine has trended up from 1.34 to 1.49 (variable baseline as up to 2.25 in 02/2021 but typically at 1.3 - 1.4). Will stop IV Lasix and plan to switch back to PO 267mdaily tomorrow. Already received IV dosing this AM so will hold additional diuretics for today.   2. Likely Persistent Atrial Fibrillation - Was diagnosed in 01/2021 and he has been in atrial fibrillation since by review of notes and EKG's. Rates were in the 120's to 130's yesterday but Coreg had been held on admission and was restarted at 3.12565mID. Given his elevated rates and softer BP, will stop Coreg and switch to Lopressor 38m56mD with hold parameters in place.  - He does have a CHA2DS2-VASc Score of 6 but is not felt to be a candidate for anticoagulation due to his prior GIB and known esophageal varices. Therefore, cannot consider DCCV or antiarrhythmics at this time.   3. HTN - BP has been soft at 101/55 - 110/61 within the past 24 hours. PTA Lisinopril held to allow for titration of AV nodal blocking agents.   4. HLD - He has been continued PTA Simvastatin 40mg57mly.   5. Cirrhosis/Esophageal Varices - He has known esophageal varices and  had undergone banding procedures and also has known AVM's. Previously had portal vein thrombosis and underwent TIPS procedure. Followed closely by GI as an outpatient.   6. Hypokalemia/Hypomagnesemia - Mg at 1.4 this AM and K+ at 3.3. Will order supplementation.   For questions or updates, please contact [facility] consult [email or web address] for contact info under        Signed, BrittErma HeritageC  04/14/2021, 8:42 AM      Attending note:  Patient seen and examined.  Case discussed with the Delano Metz, agree with her above findings.  Patient reports improvement in breathing.  He remains in rapid atrial fibrillation however per telemetry review.  Echocardiogram demonstrates normal LVEF at 60 to 65% with mild to moderate mitral regurgitation, severely dilated left atrium.  Weight has improved with IV diuresis, creatinine 1.49.  He was placed on Coreg 3.125 mg twice daily, otherwise lisinopril has been held.  Pertinent lab work includes potassium 3.3, magnesium 1.4, BUN 16, creatinine 1.49 up from 1.39, hemoglobin 10.9.  Chart reviewed.  Would recommend stopping Coreg with transition to metoprolol which we will begin at short acting divided dose today and consolidate to Toprol-XL tomorrow.  Hopefully this will provide better heart rate control and not overly reduce blood pressure.  Continue to hold off on resumption of lisinopril.  Agree with conversion to oral Lasix beginning tomorrow.  Replete electrolytes and ambulate today.  Satira Sark, M.D., F.A.C.C.

## 2021-04-14 NOTE — Progress Notes (Signed)
Patient has ambulated in room by himself with no shortness of breath reported. He remains independent with ADL's

## 2021-04-16 ENCOUNTER — Other Ambulatory Visit (INDEPENDENT_AMBULATORY_CARE_PROVIDER_SITE_OTHER): Payer: Self-pay | Admitting: Gastroenterology

## 2021-04-16 DIAGNOSIS — K7682 Hepatic encephalopathy: Secondary | ICD-10-CM

## 2021-04-19 ENCOUNTER — Telehealth (INDEPENDENT_AMBULATORY_CARE_PROVIDER_SITE_OTHER): Payer: Self-pay

## 2021-04-19 DIAGNOSIS — E1129 Type 2 diabetes mellitus with other diabetic kidney complication: Secondary | ICD-10-CM | POA: Diagnosis not present

## 2021-04-19 DIAGNOSIS — D649 Anemia, unspecified: Secondary | ICD-10-CM | POA: Diagnosis not present

## 2021-04-19 DIAGNOSIS — I4891 Unspecified atrial fibrillation: Secondary | ICD-10-CM | POA: Diagnosis not present

## 2021-04-19 DIAGNOSIS — N183 Chronic kidney disease, stage 3 unspecified: Secondary | ICD-10-CM | POA: Diagnosis not present

## 2021-04-19 DIAGNOSIS — I509 Heart failure, unspecified: Secondary | ICD-10-CM | POA: Diagnosis not present

## 2021-04-19 NOTE — Progress Notes (Signed)
See   Cardiology Office Note    Date:  04/19/2021   ID:  DOVBER ERNEST, DOB 11-Jun-1940, MRN [id number]   PCP:  [name], MD   [place]  Cardiologist:  Carlyle Dolly, MD   Advanced Practice Provider:  No care team member to display Electrophysiologist:  None   [phone]   Chief Complaint  Patient presents with   Hospitalization Follow-up    History of Present Illness:  Tyler Farmer is a 81 y.o. male  with a hx of paroxysmal atrial fibrillation (not on anticoagulation due to GIB and history of varices and appears persistent since 01/2021), Stage 3 CKD, cirrhosis, GERD, hypothyroidism and history of GIB due to AVM's and esophageal varices.  Patient admitted 03/2021 with acute HF after diuretic stopped by PCP as well as A. fib with RVR.  He was diuresed and coreg switched to toprol xl. Echo normal LVEF 60-65% mild to mod MR, severe LAE.  Patient comes in with his wife. He gets short of breath with activity. He feels heart racing when walking around the house. No dizziness or presyncope. Having trouble sleeping and taking tylenol.    Past Medical History:  Diagnosis Date   A-fib [facility])    AKI (acute kidney injury) ([place]) 01/27/2021   Arthritis    Hands/Fingers   Asthma    Back pain    Cirrhosis of liver (HCC)    Diabetes mellitus ([place])    Enlarged prostate    Gastric ulcer    GERD (gastroesophageal reflux disease)    H/O bladder problems    History of kidney stones    Hypercholesteremia    Hypertension    Prostate cancer ([place])    Thyroid disease    UGI bleed 10/26/2016   Vitamin B12 deficiency 10/26/2016    Past Surgical History:  Procedure Laterality Date   CIRCUMCISION     at age 14   COLONOSCOPY N/A 11/13/2015   Procedure: COLONOSCOPY;  Surgeon: Rogene Houston, MD;  [facility];  Service: Endoscopy;  Laterality: N/A;  2:10   COLONOSCOPY WITH PROPOFOL N/A 06/17/2020   Procedure: COLONOSCOPY WITH PROPOFOL;   Surgeon: Rogene Houston, MD;  [facility];  Service: Endoscopy;  Laterality: N/A;  AM / patient had positive covid 05/25/20   ESOPHAGEAL BANDING N/A 02/13/2021   Procedure: ESOPHAGEAL BANDING;  Surgeon: Harvel Quale, MD;  [facility];  Service: Gastroenterology;  Laterality: N/A;   ESOPHAGOGASTRODUODENOSCOPY N/A 06/03/2014   Procedure: ESOPHAGOGASTRODUODENOSCOPY (EGD);  Surgeon: Rogene Houston, MD;  [facility];  Service: Endoscopy;  Laterality: N/A;  730   ESOPHAGOGASTRODUODENOSCOPY N/A 10/26/2016   Procedure: ESOPHAGOGASTRODUODENOSCOPY (EGD);  Surgeon: Rogene Houston, MD;  [facility];  Service: Endoscopy;  Laterality: N/A;   ESOPHAGOGASTRODUODENOSCOPY (EGD) WITH PROPOFOL N/A 06/17/2020   Procedure: ESOPHAGOGASTRODUODENOSCOPY (EGD) WITH PROPOFOL;  Surgeon: Rogene Houston, MD;  [facility];  Service: Endoscopy;  Laterality: N/A;   ESOPHAGOGASTRODUODENOSCOPY (EGD) WITH PROPOFOL N/A 02/13/2021   Procedure: ESOPHAGOGASTRODUODENOSCOPY (EGD) WITH PROPOFOL;  Surgeon: Harvel Quale, MD;  [facility];  Service: Gastroenterology;  Laterality: N/A;   Gastric Ulcer  1993   GIVENS CAPSULE STUDY N/A 10/28/2016   Procedure: GIVENS CAPSULE STUDY;  Surgeon: Rogene Houston, MD;  [facility];  Service: Endoscopy;  Laterality: N/A;   HERNIA REPAIR     IR EMBO VENOUS NOT HEMORR HEMANG  INC GUIDE  ROADMAPPING  03/05/2021   IR INTRAVASCULAR ULTRASOUND NON CORONARY  03/05/2021   IR RADIOLOGIST EVAL & MGMT  02/26/2021   IR THROMBECT VENO MECH MOD SED  03/05/2021   IR TIPS  03/05/2021   IR US GUIDE VASC ACCESS RIGHT  03/05/2021   IR US GUIDE VASC ACCESS RIGHT  03/05/2021   POLYPECTOMY  11/13/2015   Procedure: POLYPECTOMY;  Surgeon: Rogene Houston, MD;  [facility];  Service: Endoscopy;;  colon   POLYPECTOMY  06/17/2020   Procedure: POLYPECTOMY;  Surgeon: Rogene Houston, MD;  [facility];  Service: Endoscopy;;  sigmoid   RADIOLOGY WITH ANESTHESIA N/A 03/05/2021   Procedure: RADIOLOGY WITH ANESTHESIA    I.R. Rise Paganini;  Surgeon: Suzette Battiest, MD;  Location: [place];  Service: Radiology;  Laterality: N/A;   Right Elbow Right    A pin was put in    Current Medications: No outpatient medications have been marked as taking for the 04/27/21 encounter (Appointment) with Imogene Burn, PA-C.     Allergies:   Penicillins   Social History   Socioeconomic History   Marital status: Married    Spouse name: Not on file   Number of children: Not on file   Years of education: Not on file   Highest education level: Not on file  Occupational History   Not on file  Tobacco Use   Smoking status: Former    Types: Cigarettes    Quit date: 05/25/1989    Years since quitting: 31.9   Smokeless tobacco: Former    Quit date: 01/16/1990  Vaping Use   Vaping Use: Never used  Substance and Sexual Activity   Alcohol use: No    Alcohol/week: 0.0 standard drinks    Comment: Drank many years - 30 years ago   Drug use: No   Sexual activity: Not on file  Other Topics Concern   Not on file  Social History Narrative   Not on file   Social Determinants of Health   Financial Resource Strain: Not on file  Food Insecurity: Not on file  Transportation Needs: Not on file  Physical Activity: Not on file  Stress: Not on file  Social Connections: Not on file     Family History:  The patient's  family history includes Cerebrovascular Accident in his father; Diabetes in his brother and sister; Emphysema in his mother; Heart disease in his brother, brother, father, and sister.   ROS:   Please see the history of present illness.    ROS All other systems reviewed and are negative.   PHYSICAL EXAM:   VS:  BP 114/78    Pulse 97    Ht 5\' 9"  (1.753 m)    Wt 196 lb 3.2 oz (89 kg)    SpO2 99%    BMI 28.97 kg/m   Physical Exam  GEN: Well nourished, well developed, in no acute distress  Neck:  no JVD, carotid bruits, or masses Cardiac: irreg 2/6 systolic murmur apex Respiratory:  clear to auscultation bilaterally, normal work of breathing GI: soft, nontender, nondistended, + BS Ext: without cyanosis, clubbing, or edema, Good distal pulses bilaterally Neuro:  Alert and Oriented x 3 Psych: euthymic mood, full affect  Wt Readings from Last 3 Encounters:  04/14/21 188 lb 12.8 oz (85.6 kg)  04/08/21 196 lb 12.8 oz (89.3 kg)  03/30/21 176 lb 5.9 oz (80 kg)      Studies/Labs Reviewed:   EKG:  EKG  is not ordered today.     Recent Labs: 04/12/2021: ALT 19; B Natriuretic Peptide 421.0 04/14/2021: BUN 16; Creatinine, Ser 1.49; Hemoglobin 10.9; Magnesium 1.8; Platelets 120; Potassium 3.3; Sodium 136   Lipid Panel No results found for: CHOL, TRIG, HDL, CHOLHDL, VLDL, LDLCALC, LDLDIRECT  Additional studies/ records that were reviewed today include:  Echocardiogram: 04/13/2021 IMPRESSIONS    1. Left ventricular ejection fraction, by estimation, is 60 to 65%. The  left ventricle has normal function. The left ventricle has no regional  wall motion abnormalities. There is mild concentric left ventricular  hypertrophy of the basal-septal segment.  Diastolic function indeterminant due to AFib.   2. Right ventricular systolic function is normal. The right ventricular  size is normal. There is normal pulmonary artery systolic pressure.   3. Left atrial size was severely dilated.   4. Right atrial size was mildly dilated.   5. The mitral valve is grossly normal. Mild to moderate mitral valve  regurgitation. Moderate mitral annular calcification.   6. The aortic valve is tricuspid. There is mild calcification of the  aortic valve. There is mild thickening of the aortic valve. Aortic valve  regurgitation is not visualized. Aortic valve sclerosis/calcification is  present, without any evidence of  aortic stenosis.   7. The inferior vena cava is dilated in size with <50% respiratory   variability, suggesting right atrial pressure of 15 mmHg.   Comparison(s): Compared to prior TTE in 03/2020, there is no significant  change.    Risk Assessment/Calculations:    CHA2DS2-VASc Score = 6   This indicates a 9.7% annual risk of stroke. The patient's score is based upon: CHF History: 1 HTN History: 1 Diabetes History: 1 Stroke History: 0 Vascular Disease History: 1 Age Score: 2 Gender Score: 0        ASSESSMENT:    1. PAF (paroxysmal atrial fibrillation) ([place])   2. Heart failure with preserved ejection fraction, unspecified HF chronicity ([place])   3. Stage 3 chronic kidney disease, unspecified whether stage 3a or 3b CKD ([place])      PLAN:  In order of problems listed above:  paroxysmal atrial fibrillation (not on anticoagulation due to GIB and history of varices and appears persistent since 01/2021), rate 100 on arrival and short of breath with exertion. Will increase metoprolol 25 mg 1 1/2 in am 1 pm.   HFpEF 60-65% echo 02/2021-CHF compensated today. Continue lasix 20 mg and spiro. Check bmet today.  CKD stage 3 f/u labs  Cirrhosis-advised to check with GI tylenol use  Shared Decision Making/Informed Consent        Medication Adjustments/Labs and Tests Ordered: Current medicines are reviewed at length with the patient today.  Concerns regarding medicines are outlined above.  Medication changes, Labs and Tests ordered today are listed in the Patient Instructions below. There are no Patient Instructions on file for this visit.   Sumner Boast, PA-C  04/19/2021 3:42 PM    [facility] [address], [place], [place]  [address] Phone: [phone]; Fax: [phone]

## 2021-04-19 NOTE — Telephone Encounter (Signed)
Patient wife called this morning stating they needed more Lactulose sent to [facility].  I advised that I sent the request in to [facility] this am. Patient states she was told by Dr. Quintin Alto to increase the medication a few days ago, and she was wanting to know if she need to continue with the increased dose. She states they have an appointment with Dr. Quintin Alto today 04/19/2021, I asked that she please ask him his recommendations on the dosing. She states she would.

## 2021-04-21 NOTE — Telephone Encounter (Signed)
Discussed with pt's wife per Dr. Jenetta Downer -  That should be ok as long as he has 2-4 bowel movements per day. She verbalized understanding. States he takes 45 mls' bid and she gives an extra cup if he seems confused. He has a BM BID. Has appt tomorrow 04/22/21 with dr Laural Golden. Advised her to discuss further at his appt tomorrow.

## 2021-04-21 NOTE — Telephone Encounter (Signed)
Left message to return call 

## 2021-04-22 ENCOUNTER — Other Ambulatory Visit: Payer: Self-pay

## 2021-04-22 ENCOUNTER — Ambulatory Visit (INDEPENDENT_AMBULATORY_CARE_PROVIDER_SITE_OTHER): Payer: Medicare Other | Admitting: Internal Medicine

## 2021-04-22 ENCOUNTER — Encounter (INDEPENDENT_AMBULATORY_CARE_PROVIDER_SITE_OTHER): Payer: Self-pay | Admitting: Internal Medicine

## 2021-04-22 VITALS — BP 108/61 | HR 96 | Temp 97.4°F | Ht 69.0 in | Wt 192.8 lb

## 2021-04-22 DIAGNOSIS — K7460 Unspecified cirrhosis of liver: Secondary | ICD-10-CM

## 2021-04-22 DIAGNOSIS — K259 Gastric ulcer, unspecified as acute or chronic, without hemorrhage or perforation: Secondary | ICD-10-CM | POA: Diagnosis not present

## 2021-04-22 DIAGNOSIS — D649 Anemia, unspecified: Secondary | ICD-10-CM | POA: Diagnosis not present

## 2021-04-22 DIAGNOSIS — K7682 Hepatic encephalopathy: Secondary | ICD-10-CM

## 2021-04-22 DIAGNOSIS — K7581 Nonalcoholic steatohepatitis (NASH): Secondary | ICD-10-CM

## 2021-04-22 NOTE — Patient Instructions (Signed)
Please notify if weight increases to 200 lbs. Physician will call with results of blood test when completed

## 2021-04-22 NOTE — Progress Notes (Signed)
Presenting complaint;  Follow-up decompensated chronic liver disease. History of peptic ulcer disease and iron deficiency anemia.  Database and subjective:  Patient is 81 year old Caucasian male who has cirrhosis secondary to NASH diagnosed in February 2016 who has not developed sequelae of cirrhosis and is here for scheduled visit. He is accompanied by his wife who states he has been in the hospital multiple times within the last few months.  Back in March 2022 he underwent EGD and colonoscopy for iron deficiency anemia.  He had grade 2 esophageal varices not large enough to be banded.  He also had single gastric AV malformation which was ablated with APC.  A colonoscopy revealed portal hypertensive colopathy 3 mm polyp which was lost and sigmoid diverticulosis. He also developed ascites which was controlled with medical therapy.  He also has history of retroperitoneal fibrosis. He was admitted to [facility] in November 2022 for abdominal pain.  CT reveals small volume ascites cirrhotic liver dilated portal vein and splenomegaly.  Gallbladder wall was thickened and enhanced.  Radiology raised the possibility of neoplasm and recommended MRCP.  He left that facility AMA.  Dr. Jenetta Downer saw him on 01/27/2021.  He obtained MRI which revealed diffuse thickening suggestive of pericholecystic varices but unchanged when compared to prior CT.  No features suggest malignancy.  This study revealed acute near occlusive thrombus involving portal vein extending into the proximal splenic vein with portal portal splenic venous confluence.  Patient was therefore admitted to [facility].  He underwent EGD by Dr. Jenetta Downer on 02/13/2021 revealing grade 3 varices which were banded and he also had gastric ulcer with oozing which was clipped for hemostasis.  Follow-up EGD was recommended.  He was readmitted to [facility] on 02/19/2021 for acute kidney injury and hepatic encephalopathy.  It was concluded that restoring  portal portal blood flow may improve hepatic function and help with hepatic encephalopathy.  He was therefore referred to IR for portal vein thrombectomy which was performed on 03/05/2021. He underwent computer-assisted portal vein aspiration thrombectomy as well as coil embolization of spontaneous splenorenal shunt. Patient was readmitted to [facility] on 04/12/2021 for congestive heart failure.  He was treated and discharged.  His CHF was felt to be due to rapid A-fib which she has history of.  He is not a candidate for anticoagulation. Patient was scheduled for follow-up EGD last month but was canceled because arrhythmia issues.   Patient states he is doing well.  His wife states he has not had any confusion episodes.  He has 2-4 bowel movements per day.  He denies abdominal pain nausea vomiting melena or rectal bleeding.  He says his appetite is very good.  His wife states he weighed 186 pounds when he left the hospital.  He has gained 6 pounds since then.  Patient states he eats ice cream every day. Patient wife states that follow-up is planned with Dr. Ruthann Cancer of IR in 2 weeks.   Current Medications: Outpatient Encounter Medications as of 04/22/2021  Medication Sig   acetaminophen (TYLENOL) 500 MG tablet Take 500 mg by mouth every 6 (six) hours as needed for mild pain.   aspirin EC 81 MG tablet Take 1 tablet (81 mg total) by mouth daily with breakfast. Swallow whole.   Cranberry 500 MG TABS Take 500 mg by mouth daily.   dicyclomine (BENTYL) 10 MG capsule Take 10 mg by mouth 2 (two) times daily as needed for spasms.   ferrous sulfate 325 (65 FE) MG  tablet Take 325 mg by mouth daily with breakfast.  ° furosemide (LASIX) 20 MG tablet Take 1 tablet (20 mg total) by mouth daily.  ° glipiZIDE (GLUCOTROL) 10 MG tablet Take 10 mg by mouth 2 (two) times daily before a meal.  ° lactulose (CHRONULAC) 10 GM/15ML solution TAKE 45 ML BY MOUTH TWICE DAILY  ° levothyroxine (SYNTHROID) 137 MCG  tablet Take 137 mcg by mouth daily before breakfast.  ° magnesium oxide (MAG-OX) 400 MG tablet Take 400 mg by mouth daily.  ° metoprolol tartrate (LOPRESSOR) 25 MG tablet Take 1 tablet (25 mg total) by mouth 2 (two) times daily.  ° ondansetron (ZOFRAN) 4 MG tablet Take 1 tablet (4 mg total) by mouth every 8 (eight) hours as needed for nausea or vomiting.  ° pantoprazole (PROTONIX) 40 MG tablet Take 1 tablet (40 mg total) by mouth 2 (two) times daily.  ° simvastatin (ZOCOR) 40 MG tablet Take 40 mg by mouth at bedtime.   ° spironolactone (ALDACTONE) 50 MG tablet Take 50 mg by mouth daily.  ° °No facility-administered encounter medications on file as of 04/22/2021.  ° ° ° °Objective: °Blood pressure 108/61, pulse 96, temperature (!) 97.4 °F (36.3 °C), temperature source Oral, height 5' 9" (1.753 m), weight 192 lb 12.8 oz (87.5 kg). °Patient is alert and in no acute distress. °He does not have asterixis. °Conjunctiva is pink. Sclera is nonicteric °Oropharyngeal mucosa is normal. °No neck masses or thyromegaly noted. °Cardiac exam with regular rhythm normal S1 and S2. No murmur or gallop noted. °Lungs are clear to auscultation. °Abdomen is symmetrical.  He has upper midline scar.  There is area of ecchymosis in right lower quadrant.  On palpation abdomen is soft.  Spleen is not palpable.  Liver edge is firm.  Abdominal wall in the region of upper midline scar noted to be firm and indurated most likely due to calcified scar. °No LE edema or clubbing noted. °He also has changes of arthritis involving left hand with poor grip.  Grip is good in right hand. ° °Labs/studies Results: ° ° °CBC Latest Ref Rng & Units 04/14/2021 04/12/2021 03/30/2021  °WBC 4.0 - 10.5 K/uL 4.0 4.8 -  °Hemoglobin 13.0 - 17.0 g/dL 10.9(L) 10.8(L) 12.2(L)  °Hematocrit 39.0 - 52.0 % 33.2(L) 33.5(L) 36.0(L)  °Platelets 150 - 400 K/uL 120(L) 121(L) -  °  °CMP Latest Ref Rng & Units 04/14/2021 04/13/2021 04/12/2021  °Glucose 70 - 99 mg/dL 120(H) 121(H) 163(H)   °BUN 8 - 23 mg/dL 16 16 15  °Creatinine 0.61 - 1.24 mg/dL 1.49(H) 1.39(H) 1.34(H)  °Sodium 135 - 145 mmol/L 136 139 136  °Potassium 3.5 - 5.1 mmol/L 3.3(L) 3.4(L) 4.0  °Chloride 98 - 111 mmol/L 100 104 104  °CO2 22 - 32 mmol/L 26 26 22  °Calcium 8.9 - 10.3 mg/dL 9.0 9.2 8.7(L)  °Total Protein 6.5 - 8.1 g/dL - - 6.3(L)  °Total Bilirubin 0.3 - 1.2 mg/dL - - 2.8(H)  °Alkaline Phos 38 - 126 U/L - - 126  °AST 15 - 41 U/L - - 26  °ALT 0 - 44 U/L - - 19  °  °Hepatic Function Latest Ref Rng & Units 04/12/2021 04/08/2021 03/12/2021  °Total Protein 6.5 - 8.1 g/dL 6.3(L) 6.2 7.4  °Albumin 3.5 - 5.0 g/dL 2.7(L) - -  °AST 15 - 41 U/L 26 24 39(H)  °ALT 0 - 44 U/L 19 18 26  °Alk Phosphatase 38 - 126 U/L 126 - -  °Total Bilirubin 0.3 -   1.2 mg/dL 2.8(H) 2.1(H) 2.1(H)  °  ° °Assessment: ° °#1.  Cirrhosis secondary to NASH with multiple sequelae including hepatic encephalopathy esophageal varices which have been banded for primary prophylaxis as well as portal vein thrombosis for which she underwent thrombectomy by IR. °Ascites lately has been minimal and not a clinical issue. ° °#2.  Hepatic encephalopathy.  Appears to be at baseline.  Suspect hepatic encephalopathy resulted from acute portal vein thrombosis in the setting of compromised hepatic function due to cirrhosis.  Once again patient reminded that he should have at least 3 bowel movements daily. ° °#3.  History of gastric ulcer.  Follow-up EGD was planned but canceled.  Patient feels he is not quite ready.  He wants to wait. ° °#4.  History of portal vein thrombosis which was felt to be acute.  He underwent successful computer-assisted portal vein thrombectomy about 6 weeks ago.  He is on low-dose aspirin.  He is not a candidate for anticoagulation because of history of GI bleed in the past.  Bleed few years ago.  Since he is to follow-up with Dr. Suttle will hold off scheduling Doppler since he will be having the study by him. ° °#5.  History of iron deficiency anemia.  No  evidence of overt GI bleed. ° ° °Plan: ° °Need for low-salt diet reinforced. °Continue daily weight check.  If his weight reaches 200 pounds he will call office. °Patient will go to the lab for CBC, and metabolic 7 and serum albumin in 1 to 2 weeks. °Office visit in 2 months. ° ° ° ° ° °

## 2021-04-26 NOTE — Telephone Encounter (Signed)
Left message to return call 

## 2021-04-27 ENCOUNTER — Encounter: Payer: Self-pay | Admitting: Physician Assistant

## 2021-04-27 ENCOUNTER — Telehealth (INDEPENDENT_AMBULATORY_CARE_PROVIDER_SITE_OTHER): Payer: Self-pay

## 2021-04-27 ENCOUNTER — Ambulatory Visit (INDEPENDENT_AMBULATORY_CARE_PROVIDER_SITE_OTHER): Payer: Medicare Other | Admitting: Physician Assistant

## 2021-04-27 VITALS — BP 114/78 | HR 97 | Ht 69.0 in | Wt 196.2 lb

## 2021-04-27 DIAGNOSIS — K7469 Other cirrhosis of liver: Secondary | ICD-10-CM | POA: Diagnosis not present

## 2021-04-27 DIAGNOSIS — I5030 Unspecified diastolic (congestive) heart failure: Secondary | ICD-10-CM | POA: Diagnosis not present

## 2021-04-27 DIAGNOSIS — N183 Chronic kidney disease, stage 3 unspecified: Secondary | ICD-10-CM

## 2021-04-27 DIAGNOSIS — I480 Paroxysmal atrial fibrillation: Secondary | ICD-10-CM

## 2021-04-27 DIAGNOSIS — K7581 Nonalcoholic steatohepatitis (NASH): Secondary | ICD-10-CM | POA: Diagnosis not present

## 2021-04-27 DIAGNOSIS — K7460 Unspecified cirrhosis of liver: Secondary | ICD-10-CM | POA: Diagnosis not present

## 2021-04-27 DIAGNOSIS — D649 Anemia, unspecified: Secondary | ICD-10-CM | POA: Diagnosis not present

## 2021-04-27 LAB — BASIC METABOLIC PANEL
BUN/Creatinine Ratio: 11 (calc) (ref 6–22)
BUN/Creatinine Ratio: 11 (calc) (ref 6–22)
BUN: 17 mg/dL (ref 7–25)
BUN: 17 mg/dL (ref 7–25)
CO2: 26 mmol/L (ref 20–32)
CO2: 26 mmol/L (ref 20–32)
Calcium: 9.2 mg/dL (ref 8.6–10.3)
Calcium: 9.2 mg/dL (ref 8.6–10.3)
Chloride: 101 mmol/L (ref 98–110)
Chloride: 101 mmol/L (ref 98–110)
Creat: 1.57 mg/dL — ABNORMAL HIGH (ref 0.70–1.22)
Creat: 1.58 mg/dL — ABNORMAL HIGH (ref 0.70–1.22)
Glucose, Bld: 228 mg/dL — ABNORMAL HIGH (ref 65–139)
Glucose, Bld: 228 mg/dL — ABNORMAL HIGH (ref 65–99)
Potassium: 4.9 mmol/L (ref 3.5–5.3)
Potassium: 4.9 mmol/L (ref 3.5–5.3)
Sodium: 134 mmol/L — ABNORMAL LOW (ref 135–146)
Sodium: 135 mmol/L (ref 135–146)

## 2021-04-27 LAB — CBC
HCT: 36.7 % — ABNORMAL LOW (ref 38.5–50.0)
Hemoglobin: 12.3 g/dL — ABNORMAL LOW (ref 13.2–17.1)
MCH: 31.4 pg (ref 27.0–33.0)
MCHC: 33.5 g/dL (ref 32.0–36.0)
MCV: 93.6 fL (ref 80.0–100.0)
MPV: 10.3 fL (ref 7.5–12.5)
Platelets: 177 10*3/uL (ref 140–400)
RBC: 3.92 10*6/uL — ABNORMAL LOW (ref 4.20–5.80)
RDW: 17.2 % — ABNORMAL HIGH (ref 11.0–15.0)
WBC: 6.1 10*3/uL (ref 3.8–10.8)

## 2021-04-27 LAB — ALBUMIN: Albumin: 3 g/dL — ABNORMAL LOW (ref 3.6–5.1)

## 2021-04-27 MED ORDER — METOPROLOL TARTRATE 25 MG PO TABS: 37.5000 mg | ORAL_TABLET | ORAL | 2 refills | Status: DC

## 2021-04-27 NOTE — Patient Instructions (Signed)
Medication Instructions:  Your physician has recommended you make the following change in your medication:  Increase metoprolol to 1&1/2 tablets in the morning and 1 tablet in the evening Continue other medications the same  Labwork: BMET today at [facility]  Testing/Procedures: none  Follow-Up: Your physician recommends that you schedule a follow-up appointment in: March as planned  Any Other Special Instructions Will Be Listed Below (If Applicable).  If you need a refill on your cardiac medications before your next appointment, please call your pharmacy.

## 2021-04-27 NOTE — Telephone Encounter (Signed)
Patient wife came by the office today wanting to know if the patient can take tylenol one po QHS to help him rest at night. She did not know with his liver function he could. Please advise.

## 2021-04-28 ENCOUNTER — Telehealth: Payer: Self-pay

## 2021-04-28 ENCOUNTER — Emergency Department (HOSPITAL_COMMUNITY): Payer: Medicare Other

## 2021-04-28 ENCOUNTER — Encounter (HOSPITAL_COMMUNITY): Payer: Self-pay | Admitting: *Deleted

## 2021-04-28 ENCOUNTER — Other Ambulatory Visit: Payer: Self-pay

## 2021-04-28 ENCOUNTER — Inpatient Hospital Stay (HOSPITAL_COMMUNITY)
Admission: EM | Admit: 2021-04-28 | Discharge: 2021-05-01 | DRG: 441 | Disposition: A | Discharge status: Home / Self Care | Payer: Medicare Other | Attending: Internal Medicine | Admitting: Internal Medicine

## 2021-04-28 ENCOUNTER — Inpatient Hospital Stay (HOSPITAL_COMMUNITY): Payer: Medicare Other

## 2021-04-28 DIAGNOSIS — Z8249 Family history of ischemic heart disease and other diseases of the circulatory system: Secondary | ICD-10-CM

## 2021-04-28 DIAGNOSIS — K7460 Unspecified cirrhosis of liver: Secondary | ICD-10-CM | POA: Diagnosis not present

## 2021-04-28 DIAGNOSIS — J9811 Atelectasis: Secondary | ICD-10-CM | POA: Diagnosis not present

## 2021-04-28 DIAGNOSIS — R9431 Abnormal electrocardiogram [ECG] [EKG]: Secondary | ICD-10-CM | POA: Diagnosis not present

## 2021-04-28 DIAGNOSIS — K7682 Hepatic encephalopathy: Principal | ICD-10-CM | POA: Diagnosis present

## 2021-04-28 DIAGNOSIS — Z7189 Other specified counseling: Secondary | ICD-10-CM

## 2021-04-28 DIAGNOSIS — E872 Acidosis, unspecified: Secondary | ICD-10-CM | POA: Diagnosis not present

## 2021-04-28 DIAGNOSIS — K7581 Nonalcoholic steatohepatitis (NASH): Secondary | ICD-10-CM | POA: Diagnosis not present

## 2021-04-28 DIAGNOSIS — I10 Essential (primary) hypertension: Secondary | ICD-10-CM | POA: Diagnosis present

## 2021-04-28 DIAGNOSIS — I517 Cardiomegaly: Secondary | ICD-10-CM | POA: Diagnosis not present

## 2021-04-28 DIAGNOSIS — N390 Urinary tract infection, site not specified: Secondary | ICD-10-CM | POA: Diagnosis not present

## 2021-04-28 DIAGNOSIS — Z86718 Personal history of other venous thrombosis and embolism: Secondary | ICD-10-CM

## 2021-04-28 DIAGNOSIS — K766 Portal hypertension: Secondary | ICD-10-CM | POA: Diagnosis not present

## 2021-04-28 DIAGNOSIS — I130 Hypertensive heart and chronic kidney disease with heart failure and stage 1 through stage 4 chronic kidney disease, or unspecified chronic kidney disease: Secondary | ICD-10-CM | POA: Diagnosis not present

## 2021-04-28 DIAGNOSIS — R0602 Shortness of breath: Secondary | ICD-10-CM

## 2021-04-28 DIAGNOSIS — E7800 Pure hypercholesterolemia, unspecified: Secondary | ICD-10-CM | POA: Diagnosis not present

## 2021-04-28 DIAGNOSIS — Z823 Family history of stroke: Secondary | ICD-10-CM

## 2021-04-28 DIAGNOSIS — J449 Chronic obstructive pulmonary disease, unspecified: Secondary | ICD-10-CM | POA: Diagnosis present

## 2021-04-28 DIAGNOSIS — Z95828 Presence of other vascular implants and grafts: Secondary | ICD-10-CM

## 2021-04-28 DIAGNOSIS — J440 Chronic obstructive pulmonary disease with acute lower respiratory infection: Secondary | ICD-10-CM | POA: Diagnosis present

## 2021-04-28 DIAGNOSIS — R531 Weakness: Secondary | ICD-10-CM | POA: Diagnosis not present

## 2021-04-28 DIAGNOSIS — K219 Gastro-esophageal reflux disease without esophagitis: Secondary | ICD-10-CM | POA: Diagnosis present

## 2021-04-28 DIAGNOSIS — K7200 Acute and subacute hepatic failure without coma: Secondary | ICD-10-CM | POA: Diagnosis not present

## 2021-04-28 DIAGNOSIS — I482 Chronic atrial fibrillation, unspecified: Secondary | ICD-10-CM | POA: Diagnosis present

## 2021-04-28 DIAGNOSIS — J45909 Unspecified asthma, uncomplicated: Secondary | ICD-10-CM | POA: Diagnosis not present

## 2021-04-28 DIAGNOSIS — Z79890 Hormone replacement therapy (postmenopausal): Secondary | ICD-10-CM

## 2021-04-28 DIAGNOSIS — J189 Pneumonia, unspecified organism: Secondary | ICD-10-CM

## 2021-04-28 DIAGNOSIS — D684 Acquired coagulation factor deficiency: Secondary | ICD-10-CM | POA: Diagnosis not present

## 2021-04-28 DIAGNOSIS — E119 Type 2 diabetes mellitus without complications: Secondary | ICD-10-CM

## 2021-04-28 DIAGNOSIS — K7689 Other specified diseases of liver: Secondary | ICD-10-CM | POA: Diagnosis not present

## 2021-04-28 DIAGNOSIS — Z8546 Personal history of malignant neoplasm of prostate: Secondary | ICD-10-CM

## 2021-04-28 DIAGNOSIS — N281 Cyst of kidney, acquired: Secondary | ICD-10-CM | POA: Diagnosis not present

## 2021-04-28 DIAGNOSIS — Z825 Family history of asthma and other chronic lower respiratory diseases: Secondary | ICD-10-CM

## 2021-04-28 DIAGNOSIS — I4891 Unspecified atrial fibrillation: Secondary | ICD-10-CM | POA: Diagnosis not present

## 2021-04-28 DIAGNOSIS — I81 Portal vein thrombosis: Secondary | ICD-10-CM | POA: Diagnosis present

## 2021-04-28 DIAGNOSIS — Z7982 Long term (current) use of aspirin: Secondary | ICD-10-CM

## 2021-04-28 DIAGNOSIS — Z20822 Contact with and (suspected) exposure to covid-19: Secondary | ICD-10-CM | POA: Diagnosis present

## 2021-04-28 DIAGNOSIS — Z833 Family history of diabetes mellitus: Secondary | ICD-10-CM

## 2021-04-28 DIAGNOSIS — R410 Disorientation, unspecified: Secondary | ICD-10-CM | POA: Diagnosis not present

## 2021-04-28 DIAGNOSIS — J159 Unspecified bacterial pneumonia: Secondary | ICD-10-CM | POA: Diagnosis present

## 2021-04-28 DIAGNOSIS — J90 Pleural effusion, not elsewhere classified: Secondary | ICD-10-CM | POA: Diagnosis not present

## 2021-04-28 DIAGNOSIS — I5032 Chronic diastolic (congestive) heart failure: Secondary | ICD-10-CM | POA: Diagnosis present

## 2021-04-28 DIAGNOSIS — E1122 Type 2 diabetes mellitus with diabetic chronic kidney disease: Secondary | ICD-10-CM | POA: Diagnosis present

## 2021-04-28 DIAGNOSIS — K922 Gastrointestinal hemorrhage, unspecified: Secondary | ICD-10-CM | POA: Diagnosis present

## 2021-04-28 DIAGNOSIS — E039 Hypothyroidism, unspecified: Secondary | ICD-10-CM | POA: Diagnosis present

## 2021-04-28 DIAGNOSIS — N189 Chronic kidney disease, unspecified: Secondary | ICD-10-CM

## 2021-04-28 DIAGNOSIS — N1831 Chronic kidney disease, stage 3a: Secondary | ICD-10-CM | POA: Diagnosis not present

## 2021-04-28 DIAGNOSIS — Z87891 Personal history of nicotine dependence: Secondary | ICD-10-CM

## 2021-04-28 DIAGNOSIS — R42 Dizziness and giddiness: Secondary | ICD-10-CM | POA: Diagnosis not present

## 2021-04-28 DIAGNOSIS — R109 Unspecified abdominal pain: Secondary | ICD-10-CM

## 2021-04-28 DIAGNOSIS — E785 Hyperlipidemia, unspecified: Secondary | ICD-10-CM | POA: Diagnosis present

## 2021-04-28 DIAGNOSIS — C61 Malignant neoplasm of prostate: Secondary | ICD-10-CM | POA: Diagnosis present

## 2021-04-28 DIAGNOSIS — Z880 Allergy status to penicillin: Secondary | ICD-10-CM

## 2021-04-28 DIAGNOSIS — Z79899 Other long term (current) drug therapy: Secondary | ICD-10-CM

## 2021-04-28 LAB — CBC WITH DIFFERENTIAL/PLATELET
Abs Immature Granulocytes: 0.01 10*3/uL (ref 0.00–0.07)
Basophils Absolute: 0.1 10*3/uL (ref 0.0–0.1)
Basophils Relative: 2 %
Eosinophils Absolute: 0.2 10*3/uL (ref 0.0–0.5)
Eosinophils Relative: 4 %
HCT: 41.3 % (ref 39.0–52.0)
Hemoglobin: 13.4 g/dL (ref 13.0–17.0)
Immature Granulocytes: 0 %
Lymphocytes Relative: 16 %
Lymphs Abs: 0.9 10*3/uL (ref 0.7–4.0)
MCH: 32.2 pg (ref 26.0–34.0)
MCHC: 32.4 g/dL (ref 30.0–36.0)
MCV: 99.3 fL (ref 80.0–100.0)
Monocytes Absolute: 0.4 10*3/uL (ref 0.1–1.0)
Monocytes Relative: 8 %
Neutro Abs: 4.1 10*3/uL (ref 1.7–7.7)
Neutrophils Relative %: 70 %
Platelets: 175 10*3/uL (ref 150–400)
RBC: 4.16 MIL/uL — ABNORMAL LOW (ref 4.22–5.81)
RDW: 19.7 % — ABNORMAL HIGH (ref 11.5–15.5)
WBC: 5.7 10*3/uL (ref 4.0–10.5)
nRBC: 0 % (ref 0.0–0.2)

## 2021-04-28 LAB — COMPREHENSIVE METABOLIC PANEL
ALT: 25 U/L (ref 0–44)
AST: 36 U/L (ref 15–41)
Albumin: 3.2 g/dL — ABNORMAL LOW (ref 3.5–5.0)
Alkaline Phosphatase: 142 U/L — ABNORMAL HIGH (ref 38–126)
Anion gap: 12 (ref 5–15)
BUN: 17 mg/dL (ref 8–23)
CO2: 23 mmol/L (ref 22–32)
Calcium: 9.7 mg/dL (ref 8.9–10.3)
Chloride: 101 mmol/L (ref 98–111)
Creatinine, Ser: 1.45 mg/dL — ABNORMAL HIGH (ref 0.61–1.24)
GFR, Estimated: 49 mL/min — ABNORMAL LOW (ref >60)
Glucose, Bld: 137 mg/dL — ABNORMAL HIGH (ref 70–99)
Potassium: 4.3 mmol/L (ref 3.5–5.1)
Sodium: 136 mmol/L (ref 135–145)
Total Bilirubin: 3.1 mg/dL — ABNORMAL HIGH (ref 0.3–1.2)
Total Protein: 7.3 g/dL (ref 6.5–8.1)

## 2021-04-28 LAB — GLUCOSE, CAPILLARY
Glucose-Capillary: 143 mg/dL — ABNORMAL HIGH (ref 70–99)
Glucose-Capillary: 154 mg/dL — ABNORMAL HIGH (ref 70–99)
Glucose-Capillary: 111 mg/dL — ABNORMAL HIGH (ref 70–99)

## 2021-04-28 LAB — AMMONIA: Ammonia: 86 umol/L — ABNORMAL HIGH (ref 9–35)

## 2021-04-28 LAB — URINALYSIS, ROUTINE W REFLEX MICROSCOPIC
Bilirubin Urine: NEGATIVE
Glucose, UA: NEGATIVE mg/dL
Ketones, ur: NEGATIVE mg/dL
Nitrite: POSITIVE — AB
Protein, ur: NEGATIVE mg/dL
Specific Gravity, Urine: 1.013 (ref 1.005–1.030)
pH: 6 (ref 5.0–8.0)

## 2021-04-28 LAB — LACTIC ACID, PLASMA
Lactic Acid, Venous: 3.5 mmol/L (ref 0.5–1.9)
Lactic Acid, Venous: 3 mmol/L (ref 0.5–1.9)

## 2021-04-28 LAB — RESP PANEL BY RT-PCR (FLU A&B, COVID) ARPGX2
Influenza A by PCR: NEGATIVE
Influenza B by PCR: NEGATIVE
SARS Coronavirus 2 by RT PCR: NEGATIVE

## 2021-04-28 LAB — PROCALCITONIN: Procalcitonin: <0.1 ng/mL

## 2021-04-28 LAB — TSH: TSH: 0.962 u[IU]/mL (ref 0.350–4.500)

## 2021-04-28 LAB — PROTIME-INR
INR: 1.4 — ABNORMAL HIGH (ref 0.8–1.2)
Prothrombin Time: 16.9 seconds — ABNORMAL HIGH (ref 11.4–15.2)

## 2021-04-28 MED ORDER — SPIRONOLACTONE 25 MG PO TABS
50.0000 mg | ORAL_TABLET | Freq: Every day | ORAL | Status: DC
  Administered 2021-04-28: 50 mg via ORAL
  Filled 2021-04-28: qty 2

## 2021-04-28 MED ORDER — ONDANSETRON HCL 4 MG/2ML IJ SOLN: 4.0000 mg | Freq: Four times a day (QID) | INTRAMUSCULAR | Status: DC | PRN

## 2021-04-28 MED ORDER — LEVOFLOXACIN IN D5W 750 MG/150ML IV SOLN
750.0000 mg | INTRAVENOUS | Status: DC
  Administered 2021-04-28 – 2021-04-30 (×2): 750 mg via INTRAVENOUS
  Filled 2021-04-28 (×2): qty 150

## 2021-04-28 MED ORDER — SIMVASTATIN 20 MG PO TABS
40.0000 mg | ORAL_TABLET | Freq: Every day | ORAL | Status: DC
  Administered 2021-04-28 – 2021-04-30 (×3): 40 mg via ORAL
  Filled 2021-04-28 (×3): qty 2

## 2021-04-28 MED ORDER — LEVOTHYROXINE SODIUM 137 MCG PO TABS
137.0000 ug | ORAL_TABLET | Freq: Every day | ORAL | Status: DC
  Administered 2021-04-29 – 2021-05-01 (×3): 137 ug via ORAL
  Filled 2021-04-28 (×3): qty 1

## 2021-04-28 MED ORDER — METOPROLOL TARTRATE 25 MG PO TABS
25.0000 mg | ORAL_TABLET | Freq: Every day | ORAL | Status: DC
  Administered 2021-04-28 – 2021-04-30 (×3): 25 mg via ORAL
  Filled 2021-04-28 (×3): qty 1

## 2021-04-28 MED ORDER — ACETAMINOPHEN 650 MG RE SUPP: 650.0000 mg | Freq: Four times a day (QID) | RECTAL | Status: DC | PRN

## 2021-04-28 MED ORDER — LACTULOSE 10 GM/15ML PO SOLN
30.0000 g | Freq: Once | ORAL | Status: AC
  Administered 2021-04-28: 30 g via ORAL
  Filled 2021-04-28: qty 60

## 2021-04-28 MED ORDER — FUROSEMIDE 20 MG PO TABS
20.0000 mg | ORAL_TABLET | Freq: Every day | ORAL | Status: DC
  Administered 2021-04-28 – 2021-04-30 (×3): 20 mg via ORAL
  Filled 2021-04-28 (×3): qty 1

## 2021-04-28 MED ORDER — ONDANSETRON HCL 4 MG PO TABS: 4.0000 mg | ORAL_TABLET | Freq: Four times a day (QID) | ORAL | Status: DC | PRN

## 2021-04-28 MED ORDER — RIFAXIMIN 550 MG PO TABS
550.0000 mg | ORAL_TABLET | Freq: Two times a day (BID) | ORAL | Status: DC
  Administered 2021-04-28 – 2021-05-01 (×6): 550 mg via ORAL
  Filled 2021-04-28 (×6): qty 1

## 2021-04-28 MED ORDER — LACTULOSE 10 GM/15ML PO SOLN
45.0000 g | Freq: Three times a day (TID) | ORAL | Status: DC
  Administered 2021-04-28 – 2021-05-01 (×9): 45 g via ORAL
  Filled 2021-04-28 (×9): qty 90

## 2021-04-28 MED ORDER — ASPIRIN EC 81 MG PO TBEC
81.0000 mg | DELAYED_RELEASE_TABLET | Freq: Every day | ORAL | Status: DC
  Administered 2021-04-29 – 2021-05-01 (×3): 81 mg via ORAL
  Filled 2021-04-28 (×3): qty 1

## 2021-04-28 MED ORDER — SODIUM CHLORIDE 0.9 % IV SOLN: INTRAVENOUS | Status: AC

## 2021-04-28 MED ORDER — PANTOPRAZOLE SODIUM 40 MG IV SOLR
40.0000 mg | Freq: Two times a day (BID) | INTRAVENOUS | Status: DC
  Administered 2021-04-28 – 2021-05-01 (×6): 40 mg via INTRAVENOUS
  Filled 2021-04-28 (×6): qty 10

## 2021-04-28 MED ORDER — ACETAMINOPHEN 325 MG PO TABS: 650.0000 mg | ORAL_TABLET | Freq: Four times a day (QID) | ORAL | Status: DC | PRN

## 2021-04-28 MED ORDER — INSULIN ASPART 100 UNIT/ML IJ SOLN
0.0000 [IU] | INTRAMUSCULAR | Status: DC
  Administered 2021-04-28: 2 [IU] via SUBCUTANEOUS
  Administered 2021-04-29 (×2): 1 [IU] via SUBCUTANEOUS
  Administered 2021-04-29 – 2021-05-01 (×5): 2 [IU] via SUBCUTANEOUS

## 2021-04-28 MED ORDER — MELATONIN 3 MG PO TABS
6.0000 mg | ORAL_TABLET | Freq: Once | ORAL | Status: AC
  Administered 2021-04-28: 6 mg via ORAL
  Filled 2021-04-28: qty 2

## 2021-04-28 MED ORDER — SODIUM CHLORIDE 0.9 % IV BOLUS
1000.0000 mL | Freq: Once | INTRAVENOUS | Status: AC
  Administered 2021-04-28: 1000 mL via INTRAVENOUS

## 2021-04-28 MED ORDER — METOPROLOL TARTRATE 25 MG PO TABS
37.5000 mg | ORAL_TABLET | ORAL | Status: DC
  Administered 2021-04-29 – 2021-05-01 (×3): 37.5 mg via ORAL
  Filled 2021-04-28 (×5): qty 2

## 2021-04-28 NOTE — ED Notes (Signed)
Unable to obtain oral temp due to pt not following command to close mouth.

## 2021-04-28 NOTE — Telephone Encounter (Signed)
-----   Message from Imogene Burn, [place] sent at 04/28/2021  7:43 AM EST ----- Kidney function up from 2 weeks ago since back on lasix. Please refer to renal for CKD if he is not already seeing them. If he sees them please have him f/u. thanks

## 2021-04-28 NOTE — Assessment & Plan Note (Addendum)
TSH stable, continue synthroid

## 2021-04-28 NOTE — ED Triage Notes (Signed)
Per wife, pt with confused since last night.  Pt takes lactulose and had morning dose today. Denies any pain. Was told his kidney levels were high and has cirrhosis of the liver.

## 2021-04-28 NOTE — ED Provider Notes (Signed)
[facility] Provider Note   CSN: [id number] Arrival date & time: 04/28/21  1104     History  No chief complaint on file.   Tyler Farmer is a 81 y.o. male.  Patient has a history of cirrhosis and prostate cancer.  Wife states that he has been confused for the last day.  She states this happens when his ammonia level gets up.  He is taking his lactulose  The history is provided by the spouse and the patient. No language interpreter was used.  Altered Mental Status Presenting symptoms: behavior changes and confusion   Severity:  Mild Most recent episode:  Today Episode history:  Single Timing:  Constant Progression:  Unchanged Chronicity:  New Context: not alcohol use   Associated symptoms: no abdominal pain, no hallucinations, no headaches, no rash and no seizures       Home Medications Prior to Admission medications   Medication Sig Start Date End Date Taking? Authorizing Provider  acetaminophen (TYLENOL) 500 MG tablet Take 500 mg by mouth every 6 (six) hours as needed for mild pain.    [provider]  aspirin EC 81 MG tablet Take 1 tablet (81 mg total) by mouth daily with breakfast. Swallow whole. 02/21/21   Roxan Hockey, MD  Cranberry 500 MG TABS Take 500 mg by mouth daily.    [provider]  ferrous sulfate 325 (65 FE) MG tablet Take 325 mg by mouth daily with breakfast.    [provider]  furosemide (LASIX) 20 MG tablet Take 1 tablet (20 mg total) by mouth daily. 04/15/21   Phillips Grout, MD  glipiZIDE (GLUCOTROL) 10 MG tablet Take 10 mg by mouth 2 (two) times daily before a meal.    [provider]  lactulose (CHRONULAC) 10 GM/15ML solution TAKE 45 ML BY MOUTH TWICE DAILY 04/19/21   Montez Morita, Quillian Quince, MD  levothyroxine (SYNTHROID) 137 MCG tablet Take 137 mcg by mouth daily before breakfast.    [provider]  magnesium oxide (MAG-OX) 400 MG tablet Take 400 mg by mouth daily.    [provider]  metoprolol tartrate (LOPRESSOR) 25 MG tablet Take 1.5 tablets (37.5 mg total) by mouth every morning. & 25 mg in the evening 04/27/21   Imogene Burn, PA-C  ondansetron (ZOFRAN) 4 MG tablet Take 1 tablet (4 mg total) by mouth every 8 (eight) hours as needed for nausea or vomiting. 03/10/21   Montez Morita, Quillian Quince, MD  pantoprazole (PROTONIX) 40 MG tablet Take 1 tablet (40 mg total) by mouth 2 (two) times daily. 02/14/21 04/27/21  Manuella Ghazi, Pratik D, DO  simvastatin (ZOCOR) 40 MG tablet Take 40 mg by mouth at bedtime.     [provider]  spironolactone (ALDACTONE) 50 MG tablet Take 50 mg by mouth daily.    [provider]      Allergies    Penicillins    Review of Systems   Review of Systems  Constitutional:  Negative for appetite change and fatigue.  HENT:  Negative for congestion, ear discharge and sinus pressure.   Eyes:  Negative for discharge.  Respiratory:  Negative for cough.   Cardiovascular:  Negative for chest pain.  Gastrointestinal:  Negative for abdominal pain and diarrhea.  Genitourinary:  Negative for frequency and hematuria.  Musculoskeletal:  Negative for back pain.  Skin:  Negative for rash.  Neurological:  Negative for seizures and headaches.  Psychiatric/Behavioral:  Positive for confusion. Negative for hallucinations.  Physical Exam Updated Vital Signs BP (!) 93/52    Pulse 94    Temp 98.2 F (36.8 C) (Axillary)    Resp (!) 27    SpO2 96%  Physical Exam Vitals and nursing note reviewed.  Constitutional:      Appearance: He is well-developed.  HENT:     Head: Normocephalic.     Nose: Nose normal.  Eyes:     General: No scleral icterus.    Conjunctiva/sclera: Conjunctivae normal.  Neck:     Thyroid: No thyromegaly.  Cardiovascular:     Rate and Rhythm: Normal rate and regular rhythm.     Heart sounds: No murmur heard.   No friction rub. No gallop.  Pulmonary:     Breath sounds: No stridor. No wheezing or rales.   Chest:     Chest wall: No tenderness.  Abdominal:     General: There is no distension.     Tenderness: There is no abdominal tenderness. There is no rebound.  Musculoskeletal:        General: Normal range of motion.     Cervical back: Neck supple.  Lymphadenopathy:     Cervical: No cervical adenopathy.  Skin:    Findings: No erythema or rash.  Neurological:     Mental Status: He is alert.     Motor: No abnormal muscle tone.     Coordination: Coordination normal.     Comments: Oriented to person only  Psychiatric:        Behavior: Behavior normal.    ED Results / Procedures / Treatments   Labs (all labs ordered are listed, but only abnormal results are displayed) Labs Reviewed  LACTIC ACID, PLASMA - Abnormal; Notable for the following components:      Result Value   Lactic Acid, Venous 3.5 (*)    All other components within normal limits  COMPREHENSIVE METABOLIC PANEL - Abnormal; Notable for the following components:   Glucose, Bld 137 (*)    Creatinine, Ser 1.45 (*)    Albumin 3.2 (*)    Alkaline Phosphatase 142 (*)    Total Bilirubin 3.1 (*)    GFR, Estimated 49 (*)    All other components within normal limits  CBC WITH DIFFERENTIAL/PLATELET - Abnormal; Notable for the following components:   RBC 4.16 (*)    RDW 19.7 (*)    All other components within normal limits  AMMONIA - Abnormal; Notable for the following components:   Ammonia 86 (*)    All other components within normal limits  LACTIC ACID, PLASMA  URINALYSIS, ROUTINE W REFLEX MICROSCOPIC    EKG None  Radiology CT Head Wo Contrast  Result Date: 04/28/2021 CLINICAL DATA:  Dizziness.  Confusion. EXAM: CT HEAD WITHOUT CONTRAST TECHNIQUE: Contiguous axial images were obtained from the base of the skull through the vertex without intravenous contrast. RADIATION DOSE REDUCTION: This exam was performed according to the departmental dose-optimization program which includes automated exposure control, adjustment  of the mA and/or kV according to patient size and/or use of iterative reconstruction technique. COMPARISON:  February 19, 2021. FINDINGS: Brain: Mild chronic ischemic white matter disease is noted. No mass effect or midline shift is noted. Ventricular size is within normal limits. There is no evidence of mass lesion, hemorrhage or acute infarction. Vascular: No hyperdense vessel or unexpected calcification. Skull: Normal. Negative for fracture or focal lesion. Sinuses/Orbits: No acute finding. Other: None. IMPRESSION: No acute intracranial abnormality seen. Electronically Signed   By: Marijo Conception  M.D.   On: 04/28/2021 12:56   DG Chest Port 1 View  Result Date: 04/28/2021 CLINICAL DATA:  Altered mental status. Weakness. Shortness of breath. EXAM: PORTABLE CHEST 1 VIEW COMPARISON:  Radiographs 04/12/2021 and 02/20/2021.  CT 04/12/2021. FINDINGS: 1156 hours. Stable cardiomegaly and aortic atherosclerosis. The previously demonstrated small left pleural effusion has improved. Persistent diffuse increased interstitial markings, likely due to mild edema superimposed on underlying emphysema. No confluent airspace opacity or pneumothorax. The bones appear unchanged. Telemetry leads overlie the chest. IMPRESSION: Interval improvement in previously demonstrated small left pleural effusion. Persistent probable mild edema superimposed on emphysema. Electronically Signed   By: Richardean Sale M.D.   On: 04/28/2021 12:15    Procedures Procedures    Medications Ordered in ED Medications  sodium chloride 0.9 % bolus 1,000 mL (has no administration in time range)  lactulose (CHRONULAC) 10 GM/15ML solution 30 g (30 g Oral Given 04/28/21 1250)    ED Course/ Medical Decision Making/ A&P                           Medical Decision Making Amount and/or Complexity of Data Reviewed Labs: ordered. Radiology: ordered.  Risk Prescription drug management. Decision regarding hospitalization.   Patient with altered  mental status most likely related to liver failure.  I spoke with the hospitalist and they will admit.  The patient was given oral lactulose.    This patient presents to the ED for concern of confusion, this involves an extensive number of treatment options, and is a complaint that carries with it a high risk of complications and morbidity.  The differential diagnosis includes stroke, worsening cirrhosis   Co morbidities that complicate the patient evaluation  Cirrhosis   Additional history obtained:  Additional history obtained from spouse External records from outside source obtained and reviewed including old hospital records   Lab Tests:  I Ordered, and personally interpreted labs.  The pertinent results include: Ammonia level elevated 86   Imaging Studies ordered:  I ordered imaging studies including CT head unremarkable unremarkable I independently visualized and interpreted imaging which showed unremarkable I agree with the radiologist interpretation   Cardiac Monitoring:  The patient was maintained on a cardiac monitor.  I personally viewed and interpreted the cardiac monitored which showed an underlying rhythm of: Normal sinus rhythm   Medicines ordered and prescription drug management:  I ordered medication including lactulose for worsening hepatic failure Reevaluation of the patient after these medicines showed that the patient stayed the same I have reviewed the patients home medicines and have made adjustments as needed   Test Considered:  CT abdomen   Critical Interventions:  Giving lactulose   Consultations Obtained:  I requested consultation with the hospitalist,  and discussed lab and imaging findings as well as pertinent plan - they recommend: Admission and ultrasound of abdomen   Problem List / ED Course:  Cirrhosis   Reevaluation:  After the interventions noted above, I reevaluated the patient and found that they have :stayed the  same   Social Determinants of Health:  None   Dispostion:  After consideration of the diagnostic results and the patients response to treatment, I feel that the patent would benefit from admission to the hospital and treatment of hepatic failure with lactulose and possibly GI consult.         Final Clinical Impression(s) / ED Diagnoses Final diagnoses:  Subacute liver failure without hepatic coma    Rx /  DC Orders ED Discharge Orders     None         Milton Ferguson, MD 04/29/21 [phone]

## 2021-04-28 NOTE — Assessment & Plan Note (Addendum)
Creatinine somewhat elevated on admission up to 1.5 but now is stable at 1.2-1.3.  Encourage oral intake.  Off IV fluids. Recent Labs  Lab 04/27/21 1344 04/28/21 1141 04/29/21 0440 04/30/21 0422 05/01/21 0156  BUN 17 17 16 15 14   CREATININE 1.58* 1.45* 1.29* 1.30* 1.34*

## 2021-04-28 NOTE — Telephone Encounter (Signed)
Pt's wife notified.

## 2021-04-28 NOTE — Hospital Course (Addendum)
[name] year old male with history of liver cirrhosis 2/2 NASH with portal hypertension, hepatic encephalopathy, portal vein thrombosis S/P IR thrombectomy 03/05/21, diabetes, COPD, hypertension,Presented to the ED with complaint of diarrhea and altered mental status. In the ED, lab orders stable with elevated ammonia 2 6, lactic acidosis 3.4, Vitals relatively stable with mild tachycardia afebrile.  Chest x-ray with interval improvement in previously demonstrated a small left pleural effusion, persistent probable mild edema superimposed on emphysema.  CT head no acute finding IV fluids ordered, lactulose given and admitted for further management.  Patient started on empiric antibiotics for possible pneumonia/UTI. Urine culture with multiple species.Mental status significantly improved.  He worked with PT OT suggested home health.  This time is medically stable.

## 2021-04-28 NOTE — Telephone Encounter (Signed)
Pt is at [facility] ED now.

## 2021-04-28 NOTE — Assessment & Plan Note (Addendum)
Prostate cancer history,he will follow-up with his urologist

## 2021-04-28 NOTE — Assessment & Plan Note (Addendum)
Lactic acid decreasing -this is due to hepatic abnormality and low clearance Recent Labs  Lab 04/28/21 1141 04/28/21 1347 04/29/21 0440 04/30/21 0422  LATICACIDVEN 3.5* 3.0* 2.9* 2.4*  PROCALCITON <0.10  --  <0.10 <0.10

## 2021-04-28 NOTE — Assessment & Plan Note (Addendum)
Known liver cirrhosis on lactulose, Lasix Aldactone. History of portal vein thrombosis w/ recent thrombectomy and history esophageal varices s/p banding for primary prophylaxis,followed by GI.  MELD score on admission 19.  GI following on board.INR elevated this morning 1.8 coagulopathy due to cirrhosis.  Ultrasound abdomen and liver Doppler no acute significant finding.  Continue on current supportive care with lactulose rifaximin.

## 2021-04-28 NOTE — Assessment & Plan Note (Addendum)
Emphysema on chest x-ray.  Continue empiric antibiotics inhaler incentive spirometry OOB PT OT

## 2021-04-28 NOTE — Assessment & Plan Note (Addendum)
Blood sugar stable.  Holding oral glipizide-resume upon discharge.Last A1c 6.8 back in November Recent Labs  Lab 04/30/21 1119 04/30/21 1622 04/30/21 1957 04/30/21 2123 05/01/21 0422  GLUCAP 161* 177* 167* 178* 124*

## 2021-04-28 NOTE — Telephone Encounter (Signed)
Discussed with pt's wife to take pt to ED. Pt's wife states she did get his meds in him. States he is more confused than normal and telling her he is sick and hurting. Not able to tell her where he hurts. Advised her to take to ED or call ambulance to go to ED.

## 2021-04-28 NOTE — Telephone Encounter (Signed)
Called and discussed with pt per Dr. Jenetta Downer - I would recommend giving him the dose three times a day so he can have more frequently 3 or even 4 BMs per day as he has some episodes of confusion occasionally  Patients wife verbalized understanding. States he is really confused today. Had 2 stools yesterday and she is trying to give him his medication now but not sure she will be able to get med into him.

## 2021-04-28 NOTE — Telephone Encounter (Signed)
Thanks, I agree, may need evaluation for causes of worsening encephalopathy, mainly infectious

## 2021-04-28 NOTE — Assessment & Plan Note (Deleted)
Elevated ammonia level with altered mental status.  CT head no acute finding.  Continue on lactulose, consider adding rifaximin +/- GI consult

## 2021-04-28 NOTE — Assessment & Plan Note (Signed)
Code Status discussed with daughter at the bedside and is full code

## 2021-04-28 NOTE — Assessment & Plan Note (Addendum)
Continue statin. 

## 2021-04-28 NOTE — Assessment & Plan Note (Addendum)
CT head no acute finding.  Ammonia elevated on admission.  Overall much improved.  Continue on his lactulose rifaximin and will discharge home with home health PT OT

## 2021-04-28 NOTE — Assessment & Plan Note (Addendum)
Soft on admission, stable. off IV fluids

## 2021-04-28 NOTE — Assessment & Plan Note (Signed)
See cirrhosis

## 2021-04-28 NOTE — Assessment & Plan Note (Signed)
Monitor LFTs.

## 2021-04-28 NOTE — H&P (Addendum)
History and Physical    PatientMarland Kitchen Rondel Episcopo Farmer Farmer DOB: [birth year] DOA: 04/28/2021 DOS: the patient was seen and examined on 04/28/2021 PCP: Tyler Hilding, MD  Patient coming from: Home  Chief Complaint: Altered mental status  HPI:81 year old male with history of liver cirrhosis 2/2 NASH with portal hypertension, hepatic encephalopathy, portal vein thrombosis S/P IR thrombectomy 03/05/21, diabetes, COPD, hypertension,Presented to the ED with complaint of diarrhea and altered mental status. In the ED, lab orders stable with elevated ammonia 2 6, lactic acidosis 3.4, Vitals relatively stable with mild tachycardia afebrile.  Chest x-ray with interval improvement in previously demonstrated a small left pleural effusion, persistent probable mild edema superimposed on emphysema.  CT head no acute finding IV fluids ordered, lactulose given and admitted for further management   Review of Systems: unable to review all systems due to the inability of the patient to answer questions.  But he did deny any abdominal pain, appears confused Past Medical History:  Diagnosis Date   A-fib ([place])    AKI (acute kidney injury) ([place]) 01/27/2021   Arthritis    Hands/Fingers   Asthma    Back pain    Cirrhosis of liver (HCC)    Diabetes mellitus ([place])    Enlarged prostate    Gastric ulcer    GERD (gastroesophageal reflux disease)    H/O bladder problems    History of kidney stones    Hypercholesteremia    Hypertension    Prostate cancer ([place])    Thyroid disease    UGI bleed 10/26/2016   Vitamin B12 deficiency 10/26/2016   Past Surgical History:  Procedure Laterality Date   CIRCUMCISION     at age 48   COLONOSCOPY N/A 11/13/2015   Procedure: COLONOSCOPY;  Surgeon: Rogene Houston, MD;  [facility];  Service: Endoscopy;  Laterality: N/A;  2:10   COLONOSCOPY WITH PROPOFOL N/A 06/17/2020   Procedure: COLONOSCOPY WITH PROPOFOL;  Surgeon: Rogene Houston, MD;  [facility];  Service: Endoscopy;  Laterality: N/A;  AM / patient had positive covid 05/25/20   ESOPHAGEAL BANDING N/A 02/13/2021   Procedure: ESOPHAGEAL BANDING;  Surgeon: Harvel Quale, MD;  [facility];  Service: Gastroenterology;  Laterality: N/A;   ESOPHAGOGASTRODUODENOSCOPY N/A 06/03/2014   Procedure: ESOPHAGOGASTRODUODENOSCOPY (EGD);  Surgeon: Rogene Houston, MD;  [facility];  Service: Endoscopy;  Laterality: N/A;  730   ESOPHAGOGASTRODUODENOSCOPY N/A 10/26/2016   Procedure: ESOPHAGOGASTRODUODENOSCOPY (EGD);  Surgeon: Rogene Houston, MD;  [facility];  Service: Endoscopy;  Laterality: N/A;   ESOPHAGOGASTRODUODENOSCOPY (EGD) WITH PROPOFOL N/A 06/17/2020   Procedure: ESOPHAGOGASTRODUODENOSCOPY (EGD) WITH PROPOFOL;  Surgeon: Rogene Houston, MD;  [facility];  Service: Endoscopy;  Laterality: N/A;   ESOPHAGOGASTRODUODENOSCOPY (EGD) WITH PROPOFOL N/A 02/13/2021   Procedure: ESOPHAGOGASTRODUODENOSCOPY (EGD) WITH PROPOFOL;  Surgeon: Harvel Quale, MD;  [facility];  Service: Gastroenterology;  Laterality: N/A;   Gastric Ulcer  1993   GIVENS CAPSULE STUDY N/A 10/28/2016   Procedure: GIVENS CAPSULE STUDY;  Surgeon: Rogene Houston, MD;  [facility];  Service: Endoscopy;  Laterality: N/A;   HERNIA REPAIR     IR EMBO VENOUS NOT HEMORR HEMANG  INC GUIDE ROADMAPPING  03/05/2021   IR INTRAVASCULAR ULTRASOUND NON CORONARY  03/05/2021   IR RADIOLOGIST EVAL & MGMT  02/26/2021   IR THROMBECT VENO MECH MOD SED  03/05/2021   IR TIPS  03/05/2021   IR US  GUIDE VASC ACCESS RIGHT  03/05/2021   IR US GUIDE VASC ACCESS RIGHT  03/05/2021   POLYPECTOMY  11/13/2015   Procedure: POLYPECTOMY;  Surgeon: Rogene Houston, MD;  [facility];  Service: Endoscopy;;  colon   POLYPECTOMY  06/17/2020   Procedure: POLYPECTOMY;  Surgeon: Rogene Houston, MD;  [facility];  Service: Endoscopy;;  sigmoid   RADIOLOGY  WITH ANESTHESIA N/A 03/05/2021   Procedure: RADIOLOGY WITH ANESTHESIA    I.R. Rise Paganini;  Surgeon: Suzette Battiest, MD;  Location: [facility];  Service: Radiology;  Laterality: N/A;   Right Elbow Right    A pin was put in   Social History:  reports that he quit smoking about 31 years ago. His smoking use included cigarettes. He quit smokeless tobacco use about 31 years ago. He reports that he does not drink alcohol and does not use drugs.  Allergies  Allergen Reactions   Penicillins Nausea And Vomiting    Has patient had a PCN reaction causing immediate rash, facial/tongue/throat swelling, SOB or lightheadedness with hypotension: Yes Has patient had a PCN reaction causing severe rash involving mucus membranes or skin necrosis: No Has patient had a PCN reaction that required hospitalization: No Has patient had a PCN reaction occurring within the last 10 years: No If all of the above answers are "NO", then may proceed with Cephalosporin use.   Patient states that it also caused pain in his sides.    Family History  Problem Relation Age of Onset   Emphysema Mother    Cerebrovascular Accident Father    Heart disease Father    Diabetes Sister    Heart disease Brother    Heart disease Sister    Heart disease Brother    Diabetes Brother     Prior to Admission medications   Medication Sig Start Date End Date Taking? Authorizing Provider  acetaminophen (TYLENOL) 500 MG tablet Take 500 mg by mouth every 6 (six) hours as needed for mild pain.   Yes [provider]  aspirin EC 81 MG tablet Take 1 tablet (81 mg total) by mouth daily with breakfast. Swallow whole. 02/21/21  Yes Emokpae, Courage, MD  Cranberry 500 MG TABS Take 500 mg by mouth daily.   Yes [provider]  ferrous sulfate 325 (65 FE) MG tablet Take 325 mg by mouth daily with breakfast.   Yes [provider]  furosemide (LASIX) 20 MG tablet Take 1 tablet (20 mg total) by mouth daily. 04/15/21  Yes Phillips Grout, MD  glipiZIDE (GLUCOTROL) 10 MG tablet Take 10 mg by mouth 2 (two) times daily before a meal.   Yes [provider]  lactulose (CHRONULAC) 10 GM/15ML solution TAKE 45 ML BY MOUTH TWICE DAILY Patient taking differently: Take 45 g by mouth 3 (three) times daily. 04/19/21  Yes Harvel Quale, MD  levothyroxine (SYNTHROID) 137 MCG tablet Take 137 mcg by mouth daily before breakfast.   Yes [provider]  magnesium oxide (MAG-OX) 400 MG tablet Take 400 mg by mouth daily.   Yes [provider]  metoprolol tartrate (LOPRESSOR) 25 MG tablet Take 1.5 tablets (37.5 mg total) by mouth every morning. & 25 mg in the evening 04/27/21  Yes Imogene Burn, PA-C  ondansetron (ZOFRAN) 4 MG tablet Take 1 tablet (4 mg total) by mouth every 8 (eight) hours as needed for nausea or vomiting. 03/10/21  Yes Montez Morita, Quillian Quince, MD  pantoprazole (PROTONIX) 40 MG tablet  Take 1 tablet (40 mg total) by mouth 2 (two) times daily. 02/14/21 04/28/21 Yes Shah, Pratik D, DO  rifaximin (XIFAXAN) 550 MG TABS tablet Take 550 mg by mouth 2 (two) times daily.   Yes [provider]  simvastatin (ZOCOR) 40 MG tablet Take 40 mg by mouth at bedtime.    Yes [provider]  spironolactone (ALDACTONE) 50 MG tablet Take 50 mg by mouth daily.   Yes [provider]    Physical Exam: Vitals:   04/28/21 1500 04/28/21 1530 04/28/21 1548 04/28/21 1554  BP: 119/70 (!) 90/51 94/60   Pulse:  (!) 109    Resp: 20 18 (!) 22   Temp:    98.3 F (36.8 C)  TempSrc:    Oral  SpO2:  95% 96%   Added late: General exam: AA confused, oriented to self only, tremulous, older than stated age, weak appearing. HEENT:Oral mucosa moist, Ear/Nose WNL grossly, dentition normal. Respiratory system: bilaterally diminished, no use of accessory muscle Cardiovascular system: S1 & S2 +, No JVD,. Gastrointestinal system: Abdomen soft,NT,ND,BS+ Nervous System:Alert, awake, moving extremities and  grossly nonfocal Extremities: LE ankle edema+, distal peripheral pulses palpable.  Skin: No rashes,no icterus. MSK: Normal muscle bulk,tone, power  Data Reviewed:   Chest x-ray independently reviewed shows improved small left pleural effusion EKG independently reviewed shows A-fib with heart rate 120   Assessment and Plan: * Encephalopathy, hepatic- (present on admission) Elevated ammonia level with altered mental status.  CT head no acute finding.  Continue on lactulose, consider adding rifaximin +/- GI consulted.  Liver cirrhosis secondary to NASH [facility])- (present on admission) Known liver cirrhosis on lactulose, Lasix Aldactone. History of portal vein thrombosis- underwent IR portal vein thrombectomy about 6 to 7 weeks ago, on low-dose aspirin, not a candidate for anticoagulation.History esophageal varices s/p banding for primary prophylaxis as well portal vein thrombosis status post IR thrombectomy due to history of GI bleeding. Followed by GI.  Check PT/INR, alk phos elevated total bili 3.1.  We will also check his ultrasound Doppler to rule out any thrombosis and ultrasound abdomen to rule out any ascites.  Discussed with GI  Goals of care, counseling/discussion Code Status discussed with daughter at the bedside and is full code  Lactic acidosis Afebrile no obvious evidence of infection.  Chest x-ray reviewed.  Continue pending.  Check ultrasound for ascites.risk of SBP. Likely in the setting of liver cirrhosis with decreased hepatic clearance.Hydrate with IV fluids trend lactic acid  Stage 3a chronic kidney disease (CKD) (HCC) Creatinine holding around 1.3-1.4 suspect his baseline  Portal hypertension ([place])- (present on admission) See cirrhosis  DM (diabetes mellitus), type 2 ([place]) Hold oral glipizide add SSI every 4 and monitor glucose.  Last A1c 6.8 back in November   Hypothyroidism- (present on admission) Resume Synthroid home dose  Prostate cancer ([place])- (present on  admission) History, he will follow-up with his urologist  S/P TIPS (transjugular intrahepatic portosystemic shunt) Monitor LFTs.  UGI bleed- (present on admission) History of upper GI bleed history of gastric ulcer.  Followed by GI Dr. Laural Golden. Monitor hemoglobin.  COPD (chronic obstructive pulmonary disease) ([place])- (present on admission) Respiratory status is stable.  Resume home inhalers  HTN (hypertension)- (present on admission) Blood pressure soft hydrated with IV fluids.  Hyperlipidemia- (present on admission) Resume home meds   Advance Care Planning:   Code Status: Full Code   Consults: GI  Family Communication: DAUGHTER AT BEDSIDE  Severity of Illness: The appropriate patient status for  this patient is INPATIENT. Inpatient status is judged to be reasonable and necessary in order to provide the required intensity of service to ensure the patient's safety. The patient's presenting symptoms, physical exam findings, and initial radiographic and laboratory data in the context of their chronic comorbidities is felt to place them at high risk for further clinical deterioration. Furthermore, it is not anticipated that the patient will be medically stable for discharge from the hospital within 2 midnights of admission.   * I certify that at the point of admission it is my clinical judgment that the patient will require inpatient hospital care spanning beyond 2 midnights from the point of admission due to high intensity of service, high risk for further deterioration and high frequency of surveillance required.*  Author: Antonieta Pert, MD 04/28/2021 3:58 PM  For on call review [email or web address].

## 2021-04-28 NOTE — Telephone Encounter (Signed)
Per dr Laural Golden - may take tylenol 500mg  ghs

## 2021-04-28 NOTE — Telephone Encounter (Signed)
I spoke with wife as patient is having issues with diarrhea with lactulose. He has not seen a nephrologist before,so I placed a referral.I also sent copy of labs to pcp

## 2021-04-28 NOTE — Consult Note (Signed)
Referring Provider: Antonieta Pert, MD Primary Care Physician:  [name], MD Primary Gastroenterologist:  Dr. Laural Golden  Reason for Consultation:    Hepatic encephalopathy.  HPI:   Patient is 81 year old Caucasian male with history of advanced cirrhosis and hepatic encephalopathy maintained on lactulose and Xifaxan who was brought to emergency room this morning because of confusion which started last night.  Patient did take a dose of lactulose this morning but did not take other medications. History is obtained from patient and his daughter Judeen Hammans who is at bedside.  Patient was doing well yesterday morning.  He had his meals and meds and was seen at cardiology clinic. He has been complaining of tickle in his throat since yesterday and he has been coughing since this morning.  No history of fever nausea vomiting chest pain abdominal pain melena or rectal bleeding.  He does complain of shortness of breath.  No history of dysuria or hematuria.  Evaluation in emergency room revealed him to be afebrile with temp of 98.2.  Lab studies revealed WBC of 5.7 with H&H of 13.4 and 41.3 and platelet count of 175K.  Serum sodium 136, potassium 4.3, chloride 101, CO2 23 glucose 137, BUN 17 and creatinine 1.45.  Calcium was 9.7.  Bilirubin 3.1.  AP 142, total protein 7.3 with albumin of 3.2, AST 36 and ALT of 25.  Serum ammonia was 86. Serum lactic acid level was 3.5.  INR 1.4.  Unenhanced head CT did not reveal any acute abnormalities to account for his presentation.  Chest film revealed decrease in left pleural effusion and raise possibility of mild edema on top of changes of COPD. I was asked to see patient.  I recommended proceeding with liver ultrasound along with Doppler study. Patient was admitted to hospitalist service and begun on IV fluids. According to nursing staff he was able to void himself and he did not have any difficulty swallowing liquids and his medications.   Past Medical History:   Diagnosis Date   A-fib [facility])    AKI (acute kidney injury) ([place]) 01/27/2021   Arthritis    Hands/Fingers   Asthma    Back pain    Cirrhosis of liver (HCC)    Diabetes mellitus ([place])    Enlarged prostate    Gastric ulcer    GERD (gastroesophageal reflux disease)    H/O bladder problems    History of kidney stones    Hypercholesteremia    Hypertension    Prostate cancer ([place])    Thyroid disease    UGI bleed 10/26/2016   Vitamin B12 deficiency 10/26/2016    Past Surgical History:  Procedure Laterality Date   CIRCUMCISION     at age 63   COLONOSCOPY N/A 11/13/2015   Procedure: COLONOSCOPY;  Surgeon: Rogene Houston, MD;  [facility];  Service: Endoscopy;  Laterality: N/A;  2:10   COLONOSCOPY WITH PROPOFOL N/A 06/17/2020   Procedure: COLONOSCOPY WITH PROPOFOL;  Surgeon: Rogene Houston, MD;  [facility];  Service: Endoscopy;  Laterality: N/A;  AM / patient had positive covid 05/25/20   ESOPHAGEAL BANDING N/A 02/13/2021   Procedure: ESOPHAGEAL BANDING;  Surgeon: Harvel Quale, MD;  [facility];  Service: Gastroenterology;  Laterality: N/A;   ESOPHAGOGASTRODUODENOSCOPY N/A 06/03/2014   Procedure: ESOPHAGOGASTRODUODENOSCOPY (EGD);  Surgeon: Rogene Houston, MD;  [facility];  Service: Endoscopy;  Laterality: N/A;  730   ESOPHAGOGASTRODUODENOSCOPY N/A 10/26/2016   Procedure: ESOPHAGOGASTRODUODENOSCOPY (EGD);  Surgeon:  Rogene Houston, MD;  [facility];  Service: Endoscopy;  Laterality: N/A;   ESOPHAGOGASTRODUODENOSCOPY (EGD) WITH PROPOFOL N/A 06/17/2020   Procedure: ESOPHAGOGASTRODUODENOSCOPY (EGD) WITH PROPOFOL;  Surgeon: Rogene Houston, MD;  [facility];  Service: Endoscopy;  Laterality: N/A;   ESOPHAGOGASTRODUODENOSCOPY (EGD) WITH PROPOFOL N/A 02/13/2021   Procedure: ESOPHAGOGASTRODUODENOSCOPY (EGD) WITH PROPOFOL;  Surgeon: Harvel Quale, MD;  [facility];  Service:  Gastroenterology;  Laterality: N/A;   Gastric Ulcer  1993   GIVENS CAPSULE STUDY N/A 10/28/2016   Procedure: GIVENS CAPSULE STUDY;  Surgeon: Rogene Houston, MD;  [facility];  Service: Endoscopy;  Laterality: N/A;   HERNIA REPAIR     IR EMBO VENOUS NOT HEMORR HEMANG  INC GUIDE ROADMAPPING  03/05/2021   IR INTRAVASCULAR ULTRASOUND NON CORONARY  03/05/2021   IR RADIOLOGIST EVAL & MGMT  02/26/2021   IR THROMBECT VENO MECH MOD SED  03/05/2021   IR TIPS  03/05/2021   IR US GUIDE VASC ACCESS RIGHT  03/05/2021   IR US GUIDE VASC ACCESS RIGHT  03/05/2021   POLYPECTOMY  11/13/2015   Procedure: POLYPECTOMY;  Surgeon: Rogene Houston, MD;  [facility];  Service: Endoscopy;;  colon   POLYPECTOMY  06/17/2020   Procedure: POLYPECTOMY;  Surgeon: Rogene Houston, MD;  [facility];  Service: Endoscopy;;  sigmoid   RADIOLOGY WITH ANESTHESIA N/A 03/05/2021   Procedure: RADIOLOGY WITH ANESTHESIA    I.R. Rise Paganini;  Surgeon: Suzette Battiest, MD;  Location: [place];  Service: Radiology;  Laterality: N/A;   Right Elbow Right    A pin was put in    Prior to Admission medications   Medication Sig Start Date End Date Taking? Authorizing Provider  acetaminophen (TYLENOL) 500 MG tablet Take 500 mg by mouth every 6 (six) hours as needed for mild pain.   Yes [provider]  aspirin EC 81 MG tablet Take 1 tablet (81 mg total) by mouth daily with breakfast. Swallow whole. 02/21/21  Yes Emokpae, Courage, MD  Cranberry 500 MG TABS Take 500 mg by mouth daily.   Yes [provider]  ferrous sulfate 325 (65 FE) MG tablet Take 325 mg by mouth daily with breakfast.   Yes [provider]  furosemide (LASIX) 20 MG tablet Take 1 tablet (20 mg total) by mouth daily. 04/15/21  Yes Phillips Grout, MD  glipiZIDE (GLUCOTROL) 10 MG tablet Take 10 mg by mouth 2 (two) times daily before a meal.   Yes [provider]  lactulose (CHRONULAC) 10 GM/15ML solution TAKE 45 ML BY  MOUTH TWICE DAILY Patient taking differently: Take 45 g by mouth 3 (three) times daily. 04/19/21  Yes Harvel Quale, MD  levothyroxine (SYNTHROID) 137 MCG tablet Take 137 mcg by mouth daily before breakfast.   Yes [provider]  magnesium oxide (MAG-OX) 400 MG tablet Take 400 mg by mouth daily.   Yes [provider]  metoprolol tartrate (LOPRESSOR) 25 MG tablet Take 1.5 tablets (37.5 mg total) by mouth every morning. & 25 mg in the evening 04/27/21  Yes Imogene Burn, PA-C  ondansetron (ZOFRAN) 4 MG tablet Take 1 tablet (4 mg total) by mouth every 8 (eight) hours as needed for nausea or vomiting. 03/10/21  Yes Montez Morita, Quillian Quince, MD  pantoprazole (PROTONIX) 40 MG tablet Take 1 tablet (40 mg total) by mouth 2 (two) times daily. 02/14/21 04/28/21 Yes Manuella Ghazi, Pratik D, DO  rifaximin (XIFAXAN) 550 MG TABS tablet Take 550 mg by mouth 2 (two) times daily.   Yes [provider]  simvastatin (ZOCOR) 40 MG tablet Take 40 mg by mouth at bedtime.    Yes [provider]  spironolactone (ALDACTONE) 50 MG tablet Take 50 mg by mouth daily.   Yes [provider]    Current Facility-Administered Medications  Medication Dose Route Frequency Provider Last Rate Last Admin   0.9 %  sodium chloride infusion   Intravenous Continuous Kc, Ramesh, MD 50 mL/hr at 04/28/21 1642 New Bag at 04/28/21 1642   acetaminophen (TYLENOL) tablet 650 mg  650 mg Oral Q6H PRN Antonieta Pert, MD       Or   acetaminophen (TYLENOL) suppository 650 mg  650 mg Rectal Q6H PRN Kc, Maren Beach, MD       [START ON 04/29/2021] aspirin EC tablet 81 mg  81 mg Oral Q breakfast Kc, Ramesh, MD       furosemide (LASIX) tablet 20 mg  20 mg Oral Daily Kc, Ramesh, MD   20 mg at 04/28/21 1726   insulin aspart (novoLOG) injection 0-9 Units  0-9 Units Subcutaneous Q4H Kc, Ramesh, MD       lactulose (CHRONULAC) 10 GM/15ML solution 45 g  45 g Oral TID Antonieta Pert, MD   45 g at 04/28/21 1721   [START ON  04/29/2021] levothyroxine (SYNTHROID) tablet 137 mcg  137 mcg Oral QAC breakfast Kc, Ramesh, MD       metoprolol tartrate (LOPRESSOR) tablet 25 mg  25 mg Oral q1800 Kc, Ramesh, MD   25 mg at 04/28/21 1726   [START ON 04/29/2021] metoprolol tartrate (LOPRESSOR) tablet 37.5 mg  37.5 mg Oral Dulcy Fanny, MD       ondansetron (ZOFRAN) tablet 4 mg  4 mg Oral Q6H PRN Kc, Ramesh, MD       Or   ondansetron (ZOFRAN) injection 4 mg  4 mg Intravenous Q6H PRN Kc, Ramesh, MD       pantoprazole (PROTONIX) injection 40 mg  40 mg Intravenous Q12H Kc, Ramesh, MD       rifaximin (XIFAXAN) tablet 550 mg  550 mg Oral BID Kc, Ramesh, MD       simvastatin (ZOCOR) tablet 40 mg  40 mg Oral QHS Kc, Ramesh, MD       spironolactone (ALDACTONE) tablet 50 mg  50 mg Oral Daily Kc, Ramesh, MD   50 mg at 04/28/21 1726    Allergies as of 04/28/2021 - Review Complete 04/28/2021  Allergen Reaction Noted   Penicillins Nausea And Vomiting 05/26/2014    Family History  Problem Relation Age of Onset   Emphysema Mother    Cerebrovascular Accident Father    Heart disease Father    Diabetes Sister    Heart disease Brother    Heart disease Sister    Heart disease Brother    Diabetes Brother     Social History   Socioeconomic History   Marital status: Married    Spouse name: Not on file   Number of children: Not on file   Years of education: Not on file   Highest education level: Not on file  Occupational History   Not on file  Tobacco Use   Smoking status: Former    Types: Cigarettes    Quit date: 05/25/1989    Years since quitting: 31.9   Smokeless tobacco: Former    Quit date: 01/16/1990  Vaping Use   Vaping Use:  Never used  Substance and Sexual Activity   Alcohol use: No    Alcohol/week: 0.0 standard drinks    Comment: Drank many years - 30 years ago   Drug use: No   Sexual activity: Not on file  Other Topics Concern   Not on file  Social History Narrative   Not on file   Social Determinants of  Health   Financial Resource Strain: Not on file  Food Insecurity: Not on file  Transportation Needs: Not on file  Physical Activity: Not on file  Stress: Not on file  Social Connections: Not on file  Intimate Partner Violence: Not on file    Review of Systems: See HPI, otherwise normal ROS  Physical Exam: Temp:  [98.2 F (36.8 C)-98.6 F (37 C)] 98.6 F (37 C) (02/08 1643) Pulse Rate:  [70-125] 95 (02/08 1643) Resp:  [18-34] 20 (02/08 1643) BP: (90-128)/(51-100) 104/54 (02/08 1643) SpO2:  [93 %-99 %] 97 % (02/08 1643) Weight:  [86.6 kg] 86.6 kg (02/08 1735)   Patient is alert.  He knows he is in the hospital but thought he was in [place].  He does remember my name. He has asterixis. Eyelids are puffy. Conjunctiva is pink.  Sclera does not appear to be icteric in this artificial light. No neck masses thyromegaly or lymphadenopathy. Cardiac exam with irregular rhythm normal S1 and S2.  No murmur or gallop noted. Auscultation lungs reveal crackles at left base and left midlung. Breath sounds are normal over right lung. Abdomen is symmetrical with upper midline scar.  Bowel sounds are normal.  On palpation abdomen is soft.  Induration noted to abdominal wall towards upper part of midline scar felt to be due to calcification.  No organomegaly or masses. He does not have peripheral edema.  Nailbeds are cyanotic.  His hands are cold.   Intake/Output from previous day: No intake/output data recorded. Intake/Output this shift: No intake/output data recorded.  Lab Results: Recent Labs    04/27/21 1342 04/28/21 1141  WBC 6.1 5.7  HGB 12.3* 13.4  HCT 36.7* 41.3  PLT 177 175   BMET Recent Labs    04/27/21 1342 04/27/21 1344 04/28/21 1141  NA 134* 135 136  K 4.9 4.9 4.3  CL 101 101 101  CO2 26 26 23   GLUCOSE 228* 228* 137*  BUN 17 17 17   CREATININE 1.57* 1.58* 1.45*  CALCIUM 9.2 9.2 9.7   LFT Recent Labs    04/28/21 1141  PROT 7.3  ALBUMIN 3.2*  AST 36   ALT 25  ALKPHOS 142*  BILITOT 3.1*   PT/INR Recent Labs    04/28/21 1145  LABPROT 16.9*  INR 1.4*   Hepatitis Panel No results for input(s): HEPBSAG, HCVAB, HEPAIGM, HEPBIGM in the last 72 hours.  Studies/Results: CT Head Wo Contrast  Result Date: 04/28/2021 CLINICAL DATA:  Dizziness.  Confusion. EXAM: CT HEAD WITHOUT CONTRAST TECHNIQUE: Contiguous axial images were obtained from the base of the skull through the vertex without intravenous contrast. RADIATION DOSE REDUCTION: This exam was performed according to the departmental dose-optimization program which includes automated exposure control, adjustment of the mA and/or kV according to patient size and/or use of iterative reconstruction technique. COMPARISON:  February 19, 2021. FINDINGS: Brain: Mild chronic ischemic white matter disease is noted. No mass effect or midline shift is noted. Ventricular size is within normal limits. There is no evidence of mass lesion, hemorrhage or acute infarction. Vascular: No hyperdense vessel or unexpected calcification. Skull: Normal. Negative for  fracture or focal lesion. Sinuses/Orbits: No acute finding. Other: None. IMPRESSION: No acute intracranial abnormality seen. Electronically Signed   By: Marijo Conception M.D.   On: 04/28/2021 12:56   US Abdomen Complete  Result Date: 04/28/2021 CLINICAL DATA:  Cirrhosis of the liver. EXAM: ABDOMEN ULTRASOUND COMPLETE COMPARISON:  01/27/2021 FINDINGS: Gallbladder: Poorly visualized.  No gross abnormalities seen. Common bile duct: Diameter: 6.8 mm Liver: Mildly echogenic with mildly nodular contours. TIPS visualized. No liver masses seen. Portal vein is patent on color Doppler imaging with normal direction of blood flow towards the liver. IVC: No abnormality visualized. Pancreas: Visualized portion unremarkable. Spleen: Size and appearance within normal limits. Right Kidney: Length: 11.1 cm. Multiple cysts. Normal echotexture. No hydronephrosis. Left Kidney: Length:  12.5 cm. Multiple cysts. Normal echotexture. No hydronephrosis. Abdominal aorta: No aneurysm visualized. Other findings: The examination was limited by bowel gas and breathing with an altered mental status. IMPRESSION: 1. Stable changes of cirrhosis of the liver with no interval masses visualized. 2. Limited examination for reasons discussed above with poor visualization of the gallbladder. Electronically Signed   By: Claudie Revering M.D.   On: 04/28/2021 15:55   DG Chest Port 1 View  Result Date: 04/28/2021 CLINICAL DATA:  Altered mental status. Weakness. Shortness of breath. EXAM: PORTABLE CHEST 1 VIEW COMPARISON:  Radiographs 04/12/2021 and 02/20/2021.  CT 04/12/2021. FINDINGS: 1156 hours. Stable cardiomegaly and aortic atherosclerosis. The previously demonstrated small left pleural effusion has improved. Persistent diffuse increased interstitial markings, likely due to mild edema superimposed on underlying emphysema. No confluent airspace opacity or pneumothorax. The bones appear unchanged. Telemetry leads overlie the chest. IMPRESSION: Interval improvement in previously demonstrated small left pleural effusion. Persistent probable mild edema superimposed on emphysema. Electronically Signed   By: Richardean Sale M.D.   On: 04/28/2021 12:15    Abdominal ultrasound sound shows nodular liver consistent with cirrhosis no focal abnormalities. Doppler study reveals patent portal vein official report is pending.  Assessment;  Patient is 81 year old Caucasian male with known cirrhosis and history of hepatic encephalopathy maintained on lactulose and Xifaxan who presents with signs and symptoms of hepatic encephalopathy.  He does not appear to be dehydrated.  There is no evidence of GI bleed.  He is dyspneic.  He is coughing and he has rales over left lung base.  I suspect that he has respiratory infection or pneumonia to account for hepatic decompensation.  History of computer-assisted aspiration thrombectomy  about 7 weeks ago via IJ approach.  He had TIPS placement at the time with coil embolization of spontaneous splenorenal shunt.  TIPS appears to be patent.  Cirrhosis secondary to NASH diagnosed in 8 years ago.  He is now developed decompensation.  MELD score based on lab studies from today is 19.   He has history of gastric ulcer but no evidence of GI bleed.  He has chronic kidney disease but renal function appears to be stable.  Chronic atrial fibrillation.  Patient not a candidate for anticoagulation because of history of GI bleed on more than one occasion.  He also has history of diastolic CHF which seems to be compensated.  He was seen at cardiology clinic yesterday.     Recommendations;  He needs to be screened for influenza and COVID and consider IV antibiotics as discussed with Dr. Antonieta Pert. Continue lactulose and Xifaxan as before. Repeat lab in a.m. to include LFTs and INR.     LOS: 0 days   Sharlyn Odonnel  04/28/2021, 5:53 PM

## 2021-04-28 NOTE — Assessment & Plan Note (Addendum)
History of upper GI bleed history of gastric ulcer and history of esophageal banding.  Stable. Recent Labs  Lab 04/27/21 1342 04/28/21 1141 04/29/21 0440 04/30/21 0422  HGB 12.3* 13.4 11.3* 11.5*  HCT 36.7* 41.3 34.7* 35.6*

## 2021-04-29 ENCOUNTER — Inpatient Hospital Stay (HOSPITAL_COMMUNITY): Payer: Medicare Other

## 2021-04-29 DIAGNOSIS — K7460 Unspecified cirrhosis of liver: Secondary | ICD-10-CM

## 2021-04-29 DIAGNOSIS — N390 Urinary tract infection, site not specified: Secondary | ICD-10-CM

## 2021-04-29 DIAGNOSIS — J189 Pneumonia, unspecified organism: Secondary | ICD-10-CM

## 2021-04-29 DIAGNOSIS — R0602 Shortness of breath: Secondary | ICD-10-CM

## 2021-04-29 DIAGNOSIS — Z95828 Presence of other vascular implants and grafts: Secondary | ICD-10-CM

## 2021-04-29 DIAGNOSIS — K7581 Nonalcoholic steatohepatitis (NASH): Secondary | ICD-10-CM

## 2021-04-29 LAB — COMPREHENSIVE METABOLIC PANEL
ALT: 21 U/L (ref 0–44)
AST: 29 U/L (ref 15–41)
Albumin: 2.6 g/dL — ABNORMAL LOW (ref 3.5–5.0)
Alkaline Phosphatase: 108 U/L (ref 38–126)
Anion gap: 7 (ref 5–15)
BUN: 16 mg/dL (ref 8–23)
CO2: 24 mmol/L (ref 22–32)
Calcium: 9.1 mg/dL (ref 8.9–10.3)
Chloride: 108 mmol/L (ref 98–111)
Creatinine, Ser: 1.29 mg/dL — ABNORMAL HIGH (ref 0.61–1.24)
GFR, Estimated: 56 mL/min — ABNORMAL LOW (ref >60)
Glucose, Bld: 102 mg/dL — ABNORMAL HIGH (ref 70–99)
Potassium: 4.1 mmol/L (ref 3.5–5.1)
Sodium: 139 mmol/L (ref 135–145)
Total Bilirubin: 2.7 mg/dL — ABNORMAL HIGH (ref 0.3–1.2)
Total Protein: 5.9 g/dL — ABNORMAL LOW (ref 6.5–8.1)

## 2021-04-29 LAB — BLOOD GAS, ARTERIAL
Acid-base deficit: 2.1 mmol/L — ABNORMAL HIGH (ref 0.0–2.0)
Bicarbonate: 23 mmol/L (ref 20.0–28.0)
Drawn by: 27407
FIO2: 21
O2 Saturation: 94.6 %
Patient temperature: 36.7
pCO2 arterial: 33.3 mmHg (ref 32.0–48.0)
pH, Arterial: 7.428 (ref 7.350–7.450)
pO2, Arterial: 83.7 mmHg (ref 83.0–108.0)

## 2021-04-29 LAB — CBC
HCT: 34.7 % — ABNORMAL LOW (ref 39.0–52.0)
Hemoglobin: 11.3 g/dL — ABNORMAL LOW (ref 13.0–17.0)
MCH: 31.8 pg (ref 26.0–34.0)
MCHC: 32.6 g/dL (ref 30.0–36.0)
MCV: 97.7 fL (ref 80.0–100.0)
Platelets: 125 10*3/uL — ABNORMAL LOW (ref 150–400)
RBC: 3.55 MIL/uL — ABNORMAL LOW (ref 4.22–5.81)
RDW: 19.4 % — ABNORMAL HIGH (ref 11.5–15.5)
WBC: 5.1 10*3/uL (ref 4.0–10.5)
nRBC: 0 % (ref 0.0–0.2)

## 2021-04-29 LAB — GLUCOSE, CAPILLARY
Glucose-Capillary: 101 mg/dL — ABNORMAL HIGH (ref 70–99)
Glucose-Capillary: 122 mg/dL — ABNORMAL HIGH (ref 70–99)
Glucose-Capillary: 131 mg/dL — ABNORMAL HIGH (ref 70–99)
Glucose-Capillary: 139 mg/dL — ABNORMAL HIGH (ref 70–99)
Glucose-Capillary: 162 mg/dL — ABNORMAL HIGH (ref 70–99)
Glucose-Capillary: 93 mg/dL (ref 70–99)

## 2021-04-29 LAB — LACTIC ACID, PLASMA: Lactic Acid, Venous: 2.9 mmol/L (ref 0.5–1.9)

## 2021-04-29 LAB — PROCALCITONIN: Procalcitonin: <0.1 ng/mL

## 2021-04-29 MED ORDER — ALBUTEROL SULFATE HFA 108 (90 BASE) MCG/ACT IN AERS: 2.0000 | INHALATION_SPRAY | Freq: Four times a day (QID) | RESPIRATORY_TRACT | Status: DC | PRN

## 2021-04-29 MED ORDER — HYDROXYZINE HCL 10 MG PO TABS
10.0000 mg | ORAL_TABLET | Freq: Three times a day (TID) | ORAL | Status: DC | PRN
  Administered 2021-04-29 – 2021-04-30 (×2): 10 mg via ORAL
  Filled 2021-04-29 (×2): qty 1

## 2021-04-29 MED ORDER — SODIUM CHLORIDE 0.9 % IV SOLN: INTRAVENOUS | Status: AC

## 2021-04-29 NOTE — Plan of Care (Signed)
°  Problem: Acute Rehab PT Goals(only PT should resolve) Goal: Pt Will Go Supine/Side To Sit Outcome: Progressing Flowsheets (Taken 04/29/2021 1157) Pt will go Supine/Side to Sit:  with modified independence  Independently Goal: Patient Will Transfer Sit To/From Stand Outcome: Progressing Flowsheets (Taken 04/29/2021 1157) Patient will transfer sit to/from stand: with modified independence Goal: Pt Will Transfer Bed To Chair/Chair To Bed Outcome: Progressing Flowsheets (Taken 04/29/2021 1157) Pt will Transfer Bed to Chair/Chair to Bed: with modified independence Goal: Pt Will Ambulate Outcome: Progressing Flowsheets (Taken 04/29/2021 1157) Pt will Ambulate:  100 feet  with modified independence  with least restrictive assistive device   11:58 AM, 04/29/21 Lonell Grandchild, MPT Physical Therapist with [facility] [phone] office [phone] mobile phone

## 2021-04-29 NOTE — Evaluation (Signed)
Physical Therapy Evaluation Patient Details Name: Tyler Farmer MRN: [id number] DOB: [birth year] Today's Date: 04/29/2021  History of Present Illness  81 year old male with history of liver cirrhosis 2/2 NASH with portal hypertension, hepatic encephalopathy, portal vein thrombosis S/P IR thrombectomy 03/05/21, diabetes, COPD, hypertension,Presented to the ED with complaint of diarrhea and altered mental status.  In the ED, lab orders stable with elevated ammonia 2 6, lactic acidosis 3.4, Vitals relatively stable with mild tachycardia afebrile.  Chest x-ray with interval improvement in previously demonstrated a small left pleural effusion, persistent probable mild edema superimposed on emphysema.  CT head no acute finding  IV fluids ordered, lactulose given and admitted for further management   Clinical Impression  Patient slightly unsteady on feet during transfers and ambulation in hallway without loss of balance, required occasional verbal/tactile for following directions mostly due to mild confusion and tolerated sitting up in chair after therapy - nursing staff notified.  Patient will benefit from continued skilled physical therapy in hospital and recommended venue below to increase strength, balance, endurance for safe ADLs and gait.        Recommendations for follow up therapy are one component of a multi-disciplinary discharge planning process, led by the attending physician.  Recommendations may be updated based on patient status, additional functional criteria and insurance authorization.  Follow Up Recommendations Home health PT    Assistance Recommended at Discharge Intermittent Supervision/Assistance  Patient can return home with the following  A little help with walking and/or transfers;A little help with bathing/dressing/bathroom;Assistance with cooking/housework;Help with stairs or ramp for entrance    Equipment Recommendations None recommended by PT  Recommendations for Other  Services       Functional Status Assessment Patient has had a recent decline in their functional status and demonstrates the ability to make significant improvements in function in a reasonable and predictable amount of time.     Precautions / Restrictions Precautions Precautions: Fall Restrictions Weight Bearing Restrictions: No      Mobility  Bed Mobility               General bed mobility comments: as per OT notes    Transfers                   General transfer comment: as per OT notes    Ambulation/Gait Ambulation/Gait assistance: Supervision, Min guard Gait Distance (Feet): 65 Feet Assistive device: None, 1 person hand held assist Gait Pattern/deviations: Decreased step length - right, Decreased step length - left, Decreased stride length Gait velocity: decreased     General Gait Details: demonstrates unsteady slightly labored cadence without loss of balance without use of AD, requires verbal/tactile cueing mostly for following directions due to mild confusion  Stairs            Wheelchair Mobility    Modified Rankin (Stroke Patients Only)       Balance Overall balance assessment: Needs assistance Sitting-balance support: Feet supported, No upper extremity supported Sitting balance-Leahy Scale: Good Sitting balance - Comments: seated EOB   Standing balance support: No upper extremity supported, During functional activity   Standing balance comment: without AD                             Pertinent Vitals/Pain Pain Assessment Pain Assessment: No/denies pain    Home Living Family/patient expects to be discharged to:: Private residence Living Arrangements: Spouse/significant other Available Help at Discharge: Family;Available 24 hours/day  Type of Home: House Home Access: Stairs to enter Entrance Stairs-Rails: Right;Left;Can reach both Entrance Stairs-Number of Steps: 2   Home Layout: One level Home Equipment: Chartered certified accountant (2 wheels);Cane - single point;Shower seat Additional Comments: Taken from previous admission with pt reporting no change in home living history.    Prior Function Prior Level of Function : Needs assist       Physical Assist : Mobility (physical);ADLs (physical) Mobility (physical): Bed mobility;Transfers;Gait;Stairs   Mobility Comments: Community ambulator without AD, occasionally drives ADLs Comments: Pt reports independence with ADL's and IADL's other than wife being the one get groceries.     Hand Dominance   Dominant Hand: Right    Extremity/Trunk Assessment   Upper Extremity Assessment Upper Extremity Assessment: Defer to OT evaluation RUE Deficits / Details: Pt presents with baseline elbow extension limitations of ~30*. Pt gernally weak with minimal limitations in B shoulder A/ROM, likely baseline. RUE Coordination: decreased fine motor    Lower Extremity Assessment Lower Extremity Assessment: Generalized weakness    Cervical / Trunk Assessment Cervical / Trunk Assessment: Kyphotic  Communication   Communication: No difficulties  Cognition Arousal/Alertness: Awake/alert Behavior During Therapy: WFL for tasks assessed/performed Overall Cognitive Status: No family/caregiver present to determine baseline cognitive functioning                                          General Comments      Exercises     Assessment/Plan    PT Assessment Patient needs continued PT services  PT Problem List Decreased strength;Decreased activity tolerance;Decreased balance;Decreased mobility       PT Treatment Interventions DME instruction;Gait training;Stair training;Functional mobility training;Therapeutic activities;Therapeutic exercise;Patient/family education;Balance training    PT Goals (Current goals can be found in the Care Plan section)  Acute Rehab PT Goals Patient Stated Goal: return home with family to assist PT Goal Formulation: With  patient/family Time For Goal Achievement: 05/03/21 Potential to Achieve Goals: Good    Frequency Min 3X/week     Co-evaluation PT/OT/SLP Co-Evaluation/Treatment: Yes Reason for Co-Treatment: To address functional/ADL transfers PT goals addressed during session: Mobility/safety with mobility;Balance;Proper use of DME OT goals addressed during session: ADL's and self-care       AM-PAC PT "6 Clicks" Mobility  Outcome Measure Help needed turning from your back to your side while in a flat bed without using bedrails?: None Help needed moving from lying on your back to sitting on the side of a flat bed without using bedrails?: None Help needed moving to and from a bed to a chair (including a wheelchair)?: A Little Help needed standing up from a chair using your arms (e.g., wheelchair or bedside chair)?: A Little Help needed to walk in hospital room?: A Little Help needed climbing 3-5 steps with a railing? : A Lot 6 Click Score: 19    End of Session   Activity Tolerance: Patient tolerated treatment well;Patient limited by fatigue Patient left: in chair;with call bell/phone within reach;with chair alarm set Nurse Communication: Mobility status PT Visit Diagnosis: Unsteadiness on feet (R26.81);Other abnormalities of gait and mobility (R26.89);Muscle weakness (generalized) (M62.81)    Time: 2542-7062 PT Time Calculation (min) (ACUTE ONLY): 28 min   Charges:   PT Evaluation $PT Eval Moderate Complexity: 1 Mod PT Treatments $Therapeutic Activity: 23-37 mins        11:56 AM, 04/29/21 Lonell Grandchild, MPT Physical Therapist with  [facility] 336 [phone] office [phone] mobile phone

## 2021-04-29 NOTE — Assessment & Plan Note (Addendum)
Daughter endorses patient gets frequent UTI.  Continue empiric antibiotics, urine culture multiple species.  Discharge  to complete 5 days course

## 2021-04-29 NOTE — Progress Notes (Addendum)
RN called due to patient complaining of shortness of breath, he was noted to be tachycardic as well with HR ranging within 110-118.  At bedside, pulse ox was 98-99% on room air, however, patient continued to complain of not getting enough air, so ABG was ordered and it showed normal ABG with pH of 7.428/33.3/33.7/23.0. Patient was reported to have a history of A-fib by daughter at bedside, he takes metoprolol twice daily.  He received 1 dose at 5:26 PM yesterday.  BP was soft at bedside, so repeated dose was not given at this time.  Patient also appears to be anxious as well. Reviewing patient's chart, it was noted that he was admitted due to hepatic encephalopathy possibly secondary to hyperammonemia.  Lactic acid was also elevated at 3.0.  This was already ordered to be repeated in the morning by the admitting physician. Repeat a check on patient's vital signs this morning show that HR has improved to 102 bpm ( <110bpm).  Continue to monitor and treat accordingly.  Total time:  15 minutes This includes time reviewing the chart including progress notes, labs, EKGs, taking medical decisions, ordering labs and documenting findings.

## 2021-04-29 NOTE — Progress Notes (Signed)
PROGRESS NOTE Christiaan Strebeck Turman  YJE:[id number] DOB: [birth year] DOA: 04/28/2021 PCP: [name], MD   Brief Narrative/Hospital Course: Taiyo Kozma Albanese, 81 y.o. male [name] year old male with history of liver cirrhosis 2/2 NASH with portal hypertension, hepatic encephalopathy, portal vein thrombosis S/P IR thrombectomy 03/05/21, diabetes, COPD, hypertension,Presented to the ED with complaint of diarrhea and altered mental status. In the ED, lab orders stable with elevated ammonia 2 6, lactic acidosis 3.4, Vitals relatively stable with mild tachycardia afebrile.  Chest x-ray with interval improvement in previously demonstrated a small left pleural effusion, persistent probable mild edema superimposed on emphysema.  CT head no acute finding IV fluids ordered, lactulose given and admitted for further management.  Patient started on empiric antibiotics for possible pneumonia/UTI    Subjective: Seen and examined this morning.  Just completed PT OT and did fairly well.  He is alert awake oriented to self current place better from yesterday but still not at baseline and remains confused. Multiple BMs after having lactulose per nursing staff. Overnight patient had some tachycardia,shortness of breath-seen by night MD.  Assessment and Plan: * Encephalopathy, hepatic- (present on admission) CT head no acute finding.  Ammonia elevated on admission.  Continue with lactulose rifaximin-having good bowel movement.  Mentation is improving but still confused.  Appreciate GI input low suspicion of SBP   Liver cirrhosis secondary to NASH ([place])- (present on admission) Known liver cirrhosis on lactulose, Lasix Aldactone. History of portal vein thrombosis w/ recent thrombectomy and history esophageal varices s/p banding for primary prophylaxis,followed by GI.  MEL D score on admission 19.  GI following on board.  INR borderline 1.4.  Ultrasound abdomen stable cirrhotic changes no masses, liver Doppler pending.   Continue continue lactulose, rifaximin, and supportive care.  Pneumonia Possible bacterial pneumonia.  Daughter endorsed patient having some respiratory issues during GI discussion.  Although x-ray no acute finding given his immunocompromise state due to liver cirrhosis started on empiric Levaquin.  Respiratory virus panel was negative.  Will complete short course of antibiotics.  Check urine Legionella and pneumonia and sputum culture  UTI (urinary tract infection) Daughter endorses patient gets frequent UTI.  Patient on empiric antibiotics.  Follow-up Culture  Goals of care, counseling/discussion Code Status discussed with daughter at the bedside and is full code  Lactic acidosis Lactic acid decreasing, suspect due to cirrhosis decreased clearance.  Patient on empiric antibiotics due to UTI and possible pneumonia Recent Labs  Lab 04/28/21 1141 04/28/21 1347 04/29/21 0440  LATICACIDVEN 3.5* 3.0* 2.9*  PROCALCITON <0.10  --  <0.10      Stage 3a chronic kidney disease (CKD) (HCC) Creatinine somewhat elevated on admission with IV fluids downtrending 1.29.  Baseline around 1.3-1.4.  Continue gentle IV fluids Recent Labs  Lab 04/27/21 1342 04/27/21 1344 04/28/21 1141 04/29/21 0440  BUN 17 17 17 16   CREATININE 1.57* 1.58* 1.45* 1.29*    Portal hypertension ([place])- (present on admission) See cirrhosis  DM (diabetes mellitus), type 2 ([place]) Blood sugar stable.  Holding oral glipizide, continue SSI.Last A1c 6.8 back in November Recent Labs  Lab 04/28/21 1647 04/28/21 1949 04/28/21 2326 04/29/21 0350 04/29/21 0718  GLUCAP 143* 154* 111* 101* 93     Hypothyroidism- (present on admission) TSH stable, continue synthroid   Prostate cancer ([place])- (present on admission) Prostate cancer history,he will follow-up with his urologist  S/P TIPS (transjugular intrahepatic portosystemic shunt) Monitor LFTs.  UGI bleed- (present on admission) History of upper GI bleed history of  gastric ulcer  and history of esophageal banding.  Stable currently Recent Labs  Lab 04/27/21 1342 04/28/21 1141 04/29/21 0440  HGB 12.3* 13.4 11.3*  HCT 36.7* 41.3 34.7*    COPD (chronic obstructive pulmonary disease) (HCC)- (present on admission) Recent cough.overnight tachypneic short of breath.  Continue home inhalers.  On empiric antibiotics    HTN (hypertension)- (present on admission) Soft on admission, stable.  Continue gentle IV fluids.     Hyperlipidemia- (present on admission) Continue statin  DVT prophylaxis: SCDs Start: 04/28/21 1546 Code Status:   Code Status: Full Code Family Communication: plan of care discussed with patient at bedside.  Discussed with daughter on admission  Disposition: Currently not medically stable for discharge. Status is: Inpatient Remains inpatient appropriate because: Ongoing confusion due to hepatic encephalopathy. Continue PT OT OOB  Objective: Vitals last 24 hrs: Vitals:   04/28/21 1949 04/28/21 2321 04/29/21 0201 04/29/21 0505  BP: (!) 95/56 119/80 125/85 107/68  Pulse: 83 (!) 107 (!) 107 (!) 102  Resp: 19 20 20 19   Temp: 97.8 F (36.6 C) 98 F (36.7 C) 98 F (36.7 C) 98.2 F (36.8 C)  TempSrc:      SpO2: 96% 99% 99% 98%  Weight:      Height:       Weight change:   Physical Examination: General exam: AA O x1-2, confused,older than stated age, weak appearing. HEENT:Oral mucosa moist, Ear/Nose WNL grossly, dentition normal. Respiratory system: bilaterally diminished BS, no use of accessory muscle Cardiovascular system: S1 & S2 +, No JVD,. Gastrointestinal system: Abdomen soft,NT,ND, BS+ Nervous System:Alert, awake, moving extremities and grossly nonfocal Extremities: LE edema mild,distal peripheral pulses palpable.  Skin: No rashes,no icterus. MSK: Normal muscle bulk,tone, power  Medications reviewed:  Scheduled Meds:  aspirin EC  81 mg Oral Q breakfast   furosemide  20 mg Oral Daily   insulin aspart  0-9 Units  Subcutaneous Q4H   lactulose  45 g Oral TID   levothyroxine  137 mcg Oral QAC breakfast   metoprolol tartrate  25 mg Oral q1800   metoprolol tartrate  37.5 mg Oral BH-q7a   pantoprazole (PROTONIX) IV  40 mg Intravenous Q12H   rifaximin  550 mg Oral BID   simvastatin  40 mg Oral QHS   Continuous Infusions:  sodium chloride 50 mL/hr at 04/29/21 0940   levofloxacin (LEVAQUIN) IV 750 mg (04/28/21 1948)      Diet Order             Diet full liquid Room service appropriate? Yes; Fluid consistency: Thin  Diet effective now                            Intake/Output Summary (Last 24 hours) at 04/29/2021 0956 Last data filed at 04/29/2021 0500 Gross per 24 hour  Intake 270 ml  Output 600 ml  Net -330 ml   Net IO Since Admission: -330 mL [04/29/21 0956]  Wt Readings from Last 3 Encounters:  04/28/21 86.6 kg  04/27/21 89 kg  04/22/21 87.5 kg     Unresulted Labs (From admission, onward)     Start     Ordered   04/29/21 0753  Expectorated Sputum Assessment w Gram Stain, Rflx to Resp Cult  ONCE - STAT,   STAT        04/29/21 0752   04/29/21 0753  Legionella Pneumophila Serogp 1 Ur Ag  Once,   R  04/29/21 0752   04/29/21 0753  Strep pneumoniae urinary antigen  Once,   R        04/29/21 0752   04/29/21 0742  Urine Culture  Add-on,   AD       Comments: If unable to add on please send new clean-catch sample   Question:  Indication  Answer:  Dysuria   04/29/21 0742   04/29/21 0500  Procalcitonin  Daily,   R      04/28/21 1803          Data Reviewed: I have personally reviewed following labs and imaging studies CBC: Recent Labs  Lab 04/27/21 1342 04/28/21 1141 04/29/21 0440  WBC 6.1 5.7 5.1  NEUTROABS  --  4.1  --   HGB 12.3* 13.4 11.3*  HCT 36.7* 41.3 34.7*  MCV 93.6 99.3 97.7  PLT 177 175 329*   Basic Metabolic Panel: Recent Labs  Lab 04/27/21 1342 04/27/21 1344 04/28/21 1141 04/29/21 0440  NA 134* 135 136 139  K 4.9 4.9 4.3 4.1  CL 101 101 101  108  CO2 26 26 23 24   GLUCOSE 228* 228* 137* 102*  BUN 17 17 17 16   CREATININE 1.57* 1.58* 1.45* 1.29*  CALCIUM 9.2 9.2 9.7 9.1   GFR: Estimated Creatinine Clearance: 47.1 mL/min (A) (by C-G formula based on SCr of 1.29 mg/dL (H)). Liver Function Tests: Recent Labs  Lab 04/28/21 1141 04/29/21 0440  AST 36 29  ALT 25 21  ALKPHOS 142* 108  BILITOT 3.1* 2.7*  PROT 7.3 5.9*  ALBUMIN 3.2* 2.6*   No results for input(s): LIPASE, AMYLASE in the last 168 hours. Recent Labs  Lab 04/28/21 1145  AMMONIA 86*   Coagulation Profile: Recent Labs  Lab 04/28/21 1145  INR 1.4*   Cardiac Enzymes: No results for input(s): CKTOTAL, CKMB, CKMBINDEX, TROPONINI in the last 168 hours. BNP (last 3 results) No results for input(s): PROBNP in the last 8760 hours. HbA1C: No results for input(s): HGBA1C in the last 72 hours. CBG: Recent Labs  Lab 04/28/21 1647 04/28/21 1949 04/28/21 2326 04/29/21 0350 04/29/21 0718  GLUCAP 143* 154* 111* 101* 93   Lipid Profile: No results for input(s): CHOL, HDL, LDLCALC, TRIG, CHOLHDL, LDLDIRECT in the last 72 hours. Thyroid Function Tests: Recent Labs    04/28/21 1603  TSH 0.962   Anemia Panel: No results for input(s): VITAMINB12, FOLATE, FERRITIN, TIBC, IRON, RETICCTPCT in the last 72 hours. Sepsis Labs: Recent Labs  Lab 04/28/21 1141 04/28/21 1347 04/29/21 0440  PROCALCITON <0.10  --  <0.10  LATICACIDVEN 3.5* 3.0* 2.9*    Recent Results (from the past 240 hour(s))  Resp Panel by RT-PCR (Flu A&B, Covid) Nasopharyngeal Swab     Status: None   Collection Time: 04/28/21  6:17 PM   Specimen: Nasopharyngeal Swab; Nasopharyngeal(NP) swabs in vial transport medium  Result Value Ref Range Status   SARS Coronavirus 2 by RT PCR NEGATIVE NEGATIVE Final    Comment: (NOTE) SARS-CoV-2 target nucleic acids are NOT DETECTED.  The SARS-CoV-2 RNA is generally detectable in upper respiratory specimens during the acute phase of infection. The  lowest concentration of SARS-CoV-2 viral copies this assay can detect is 138 copies/mL. A negative result does not preclude SARS-Cov-2 infection and should not be used as the sole basis for treatment or other patient management decisions. A negative result may occur with  improper specimen collection/handling, submission of specimen other than nasopharyngeal swab, presence of viral mutation(s) within the areas targeted by  this assay, and inadequate number of viral copies(<138 copies/mL). A negative result must be combined with clinical observations, patient history, and epidemiological information. The expected result is Negative.  Fact Sheet for Patients:  EntrepreneurPulse.com.au  Fact Sheet for Healthcare Providers:  IncredibleEmployment.be  This test is no t yet approved or cleared by the [place] FDA and  has been authorized for detection and/or diagnosis of SARS-CoV-2 by FDA under an Emergency Use Authorization (EUA). This EUA will remain  in effect (meaning this test can be used) for the duration of the COVID-19 declaration under Section 564(b)(1) of the Act, 21 U.S.C.section 360bbb-3(b)(1), unless the authorization is terminated  or revoked sooner.       Influenza A by PCR NEGATIVE NEGATIVE Final   Influenza B by PCR NEGATIVE NEGATIVE Final    Comment: (NOTE) The Xpert Xpress SARS-CoV-2/FLU/RSV plus assay is intended as an aid in the diagnosis of influenza from Nasopharyngeal swab specimens and should not be used as a sole basis for treatment. Nasal washings and aspirates are unacceptable for Xpert Xpress SARS-CoV-2/FLU/RSV testing.  Fact Sheet for Patients: EntrepreneurPulse.com.au  Fact Sheet for Healthcare Providers: IncredibleEmployment.be  This test is not yet approved or cleared by the [place] FDA and has been authorized for detection and/or diagnosis of SARS-CoV-2 by FDA under  an Emergency Use Authorization (EUA). This EUA will remain in effect (meaning this test can be used) for the duration of the COVID-19 declaration under Section 564(b)(1) of the Act, 21 U.S.C. section 360bbb-3(b)(1), unless the authorization is terminated or revoked.  Performed at [facility], [address] [place], [place] [address]     Antimicrobials: Anti-infectives (From admission, onward)    Start     Dose/Rate Route Frequency Ordered Stop   04/28/21 2200  rifaximin (XIFAXAN) tablet 550 mg        550 mg Oral 2 times daily 04/28/21 1555     04/28/21 1915  levofloxacin (LEVAQUIN) IVPB 750 mg        750 mg 100 mL/hr over 90 Minutes Intravenous Every 48 hours 04/28/21 1829        Culture/Microbiology No results found for: SDES, SPECREQUEST, CULT, REPTSTATUS  Other culture-see note   Radiology Studies: CT Head Wo Contrast  Result Date: 04/28/2021 CLINICAL DATA:  Dizziness.  Confusion. EXAM: CT HEAD WITHOUT CONTRAST TECHNIQUE: Contiguous axial images were obtained from the base of the skull through the vertex without intravenous contrast. RADIATION DOSE REDUCTION: This exam was performed according to the departmental dose-optimization program which includes automated exposure control, adjustment of the mA and/or kV according to patient size and/or use of iterative reconstruction technique. COMPARISON:  February 19, 2021. FINDINGS: Brain: Mild chronic ischemic white matter disease is noted. No mass effect or midline shift is noted. Ventricular size is within normal limits. There is no evidence of mass lesion, hemorrhage or acute infarction. Vascular: No hyperdense vessel or unexpected calcification. Skull: Normal. Negative for fracture or focal lesion. Sinuses/Orbits: No acute finding. Other: None. IMPRESSION: No acute intracranial abnormality seen. Electronically Signed   By: Marijo Conception M.D.   On: 04/28/2021 12:56   US Abdomen Complete  Result Date: 04/28/2021 CLINICAL DATA:   Cirrhosis of the liver. EXAM: ABDOMEN ULTRASOUND COMPLETE COMPARISON:  01/27/2021 FINDINGS: Gallbladder: Poorly visualized.  No gross abnormalities seen. Common bile duct: Diameter: 6.8 mm Liver: Mildly echogenic with mildly nodular contours. TIPS visualized. No liver masses seen. Portal vein is patent on color Doppler imaging with normal direction of blood flow  towards the liver. IVC: No abnormality visualized. Pancreas: Visualized portion unremarkable. Spleen: Size and appearance within normal limits. Right Kidney: Length: 11.1 cm. Multiple cysts. Normal echotexture. No hydronephrosis. Left Kidney: Length: 12.5 cm. Multiple cysts. Normal echotexture. No hydronephrosis. Abdominal aorta: No aneurysm visualized. Other findings: The examination was limited by bowel gas and breathing with an altered mental status. IMPRESSION: 1. Stable changes of cirrhosis of the liver with no interval masses visualized. 2. Limited examination for reasons discussed above with poor visualization of the gallbladder. Electronically Signed   By: Claudie Revering M.D.   On: 04/28/2021 15:55   DG Chest Port 1 View  Result Date: 04/28/2021 CLINICAL DATA:  Altered mental status. Weakness. Shortness of breath. EXAM: PORTABLE CHEST 1 VIEW COMPARISON:  Radiographs 04/12/2021 and 02/20/2021.  CT 04/12/2021. FINDINGS: 1156 hours. Stable cardiomegaly and aortic atherosclerosis. The previously demonstrated small left pleural effusion has improved. Persistent diffuse increased interstitial markings, likely due to mild edema superimposed on underlying emphysema. No confluent airspace opacity or pneumothorax. The bones appear unchanged. Telemetry leads overlie the chest. IMPRESSION: Interval improvement in previously demonstrated small left pleural effusion. Persistent probable mild edema superimposed on emphysema. Electronically Signed   By: Richardean Sale M.D.   On: 04/28/2021 12:15     LOS: 1 day   Antonieta Pert, MD [facility]  04/29/2021,  9:56 AM

## 2021-04-29 NOTE — Plan of Care (Signed)
°  Problem: Acute Rehab OT Goals (only OT should resolve) Goal: Pt. Will Perform Grooming Flowsheets (Taken 04/29/2021 0926) Pt Will Perform Grooming:  with modified independence  standing  with adaptive equipment Goal: Pt. Will Perform Lower Body Dressing Flowsheets (Taken 04/29/2021 0926) Pt Will Perform Lower Body Dressing:  with modified independence  sitting/lateral leans Goal: Pt. Will Transfer To Toilet Flowsheets (Taken 04/29/2021 [phone]) Pt Will Transfer to Toilet:  with modified independence  ambulating Goal: Pt/Caregiver Will Perform Home Exercise Program Flowsheets (Taken 04/29/2021 [phone]) Pt/caregiver will Perform Home Exercise Program:  Increased strength  Both right and left upper extremity  With Supervision  Matia Zelada OT, MOT

## 2021-04-29 NOTE — Assessment & Plan Note (Addendum)
Possible bacterial pneumonia versus bronchitis.  Suspect likely bronchitis.  Respiratory status at this time is stable.  Continue antibiotics x5 days.

## 2021-04-29 NOTE — Evaluation (Signed)
Occupational Therapy Evaluation Patient Details Name: Tyler Farmer MRN: [id number] DOB: [birth year] Today's Date: 04/29/2021   History of Present Illness 81 year old male with history of liver cirrhosis 2/2 NASH with portal hypertension, hepatic encephalopathy, portal vein thrombosis S/P IR thrombectomy 03/05/21, diabetes, COPD, hypertension,Presented to the ED with complaint of diarrhea and altered mental status.  In the ED, lab orders stable with elevated ammonia 2 6, lactic acidosis 3.4, Vitals relatively stable with mild tachycardia afebrile.  Chest x-ray with interval improvement in previously demonstrated a small left pleural effusion, persistent probable mild edema superimposed on emphysema.  CT head no acute finding  IV fluids ordered, lactulose given and admitted for further management   Clinical Impression   Pt agreeable to OT and PT co-evaluation. Pt requiring tactile and verbal cuing at times to follow dressing and functional ambulation directions. Pt required min A for donning socks seated with lateral leans. Pt in generally weak with fair balance in standing without AD. Pt has 24/7 assist from wife at home. Pt left in chair with chair alarm set. Pt will benefit from continued OT in the hospital and recommended venue below to increase strength, balance, and endurance for safe ADL's.        Recommendations for follow up therapy are one component of a multi-disciplinary discharge planning process, led by the attending physician.  Recommendations may be updated based on patient status, additional functional criteria and insurance authorization.   Follow Up Recommendations  Home health OT    Assistance Recommended at Discharge Frequent or constant Supervision/Assistance  Patient can return home with the following A little help with walking and/or transfers;A little help with bathing/dressing/bathroom;Assistance with cooking/housework;Direct supervision/assist for medications  management;Assist for transportation;Help with stairs or ramp for entrance    Functional Status Assessment  Patient has had a recent decline in their functional status and demonstrates the ability to make significant improvements in function in a reasonable and predictable amount of time.  Equipment Recommendations  BSC/3in1           Precautions / Restrictions Precautions Precautions: Fall Restrictions Weight Bearing Restrictions: No      Mobility Bed Mobility Overal bed mobility: Needs Assistance Bed Mobility: Supine to Sit     Supine to sit: Supervision     General bed mobility comments: Supervision due to pt mild confusion.    Transfers Overall transfer level: Needs assistance Equipment used: Rolling walker (2 wheels) Transfers: Sit to/from Stand, Bed to chair/wheelchair/BSC Sit to Stand: Min guard     Step pivot transfers: Min guard     General transfer comment: Min guard due to pt mild unsteady functional ambulation. Verbal and tactile cuing needed to follow direction.      Balance Overall balance assessment: Needs assistance Sitting-balance support: No upper extremity supported, Feet supported Sitting balance-Leahy Scale: Good Sitting balance - Comments: seated EOB   Standing balance support: No upper extremity supported, During functional activity Standing balance-Leahy Scale: Fair Standing balance comment: without RW                           ADL either performed or assessed with clinical judgement   ADL Overall ADL's : Needs assistance/impaired     Grooming: Min guard;Standing           Upper Body Dressing : Supervision/safety;Set up;Sitting   Lower Body Dressing: Minimal assistance;Sitting/lateral leans Lower Body Dressing Details (indicate cue type and reason): Pt needed assist to don socks. Assist  for R sock to get started on foot. Toilet Transfer: Min guard;Stand-pivot;Rolling walker (2 wheels) Toilet Transfer Details  (indicate cue type and reason): Simulated EOB to chair.         Functional mobility during ADLs: Min guard;Rolling walker (2 wheels) General ADL Comments: Pt demosntrates mild labored movement during transfers with difficulty reaching R LE for dressing tasks. Pt also needs cuing due to confusion at times.     Vision Baseline Vision/History: 1 Wears glasses Ability to See in Adequate Light: 1 Impaired Patient Visual Report: No change from baseline Vision Assessment?: No apparent visual deficits Additional Comments: per observation during functional tasks; good tracking and visual fields during formal testing. Pt did not to hug the wall on R side at times but did not run into objects.                Pertinent Vitals/Pain Pain Assessment Pain Assessment: No/denies pain     Hand Dominance Right   Extremity/Trunk Assessment Upper Extremity Assessment Upper Extremity Assessment: RUE deficits/detail;Generalized weakness (Mild fine motor defciits via difficulty manipulating socks.) RUE Deficits / Details: Pt presents with baseline elbow extension limitations of ~30*. Pt gernally weak with minimal limitations in B shoulder A/ROM, likely baseline. RUE Coordination: decreased fine motor   Lower Extremity Assessment Lower Extremity Assessment: Defer to PT evaluation   Cervical / Trunk Assessment Cervical / Trunk Assessment: Kyphotic   Communication Communication Communication: No difficulties   Cognition Arousal/Alertness: Awake/alert Behavior During Therapy: WFL for tasks assessed/performed Overall Cognitive Status: No family/caregiver present to determine baseline cognitive functioning                                 General Comments: Pt confused at times with multiple cues needed for donning sock to R foot with pt noted to put the sock on his arm rather than foot. Pt oreinted to place.                      Home Living Family/patient expects to be  discharged to:: Private residence Living Arrangements: Spouse/significant other Available Help at Discharge: Family;Available 24 hours/day Type of Home: House Home Access: Stairs to enter [employer] of Steps: 2 Entrance Stairs-Rails: Right;Left;Can reach both Home Layout: One level     Bathroom Shower/Tub: Occupational psychologist: Handicapped height Bathroom Accessibility: Yes   Home Equipment: Conservation officer, nature (2 wheels);Cane - single point;Shower seat   Additional Comments: Taken from previous admission with pt reporting no change in home living history.      Prior Functioning/Environment Prior Level of Function : Needs assist             Mobility Comments: Community ambulator without AD, occasionally drives ADLs Comments: Pt reports independence with ADL's and IADL's other than wife being the one get groceries.        OT Problem List: Decreased strength;Decreased activity tolerance;Impaired balance (sitting and/or standing)      OT Treatment/Interventions: Self-care/ADL training;Therapeutic exercise;Therapeutic activities;Patient/family education;Balance training;Cognitive remediation/compensation    OT Goals(Current goals can be found in the care plan section) Acute Rehab OT Goals Patient Stated Goal: return home OT Goal Formulation: With patient Time For Goal Achievement: 05/13/21 Potential to Achieve Goals: Good  OT Frequency: Min 2X/week    Co-evaluation PT/OT/SLP Co-Evaluation/Treatment: Yes Reason for Co-Treatment: To address functional/ADL transfers   OT goals addressed during session: ADL's and self-care  End of Session    Activity Tolerance: Patient tolerated treatment well Patient left: in chair;with call bell/phone within reach;with chair alarm set  OT Visit Diagnosis: Unsteadiness on feet (R26.81);Other abnormalities of gait and mobility (R26.89);Muscle weakness (generalized) (M62.81);Other  symptoms and signs involving cognitive function                Time: 6945-0388 OT Time Calculation (min): 19 min Charges:  OT General Charges $OT Visit: 1 Visit OT Evaluation $OT Eval Low Complexity: 1 Low  Cristina Ceniceros OT, MOT  Larey Seat 04/29/2021, 9:24 AM

## 2021-04-29 NOTE — TOC Initial Note (Signed)
Transition of Care [facility]) - Initial/Assessment Note    Patient Details  Name: Tyler Farmer MRN: [id number] Date of Birth: [birth year]  [facility]) CM/SW Contact:    Shade Flood, LCSW Phone Number: 04/29/2021, 11:28 AM  Clinical Narrative:                  Pt admitted from home. Pt high risk for readmission. Met with pt and his wife to review dc planning. Pt has a walker and rails in the bathroom for DME. His wife assists with care. Pt and his wife report that they are able to get to appointments and obtain pt's medications without difficulty. Discussed therapy recommendation for [facility] therapy at dc. Pt and his wife state that they do not want this service and that they can manage at home without it. They are aware that if they change their minds, TOC can arrange it.  MD anticipating dc possibly tomorrow. TOC will follow.  Expected Discharge Plan: Home/Self Care Barriers to Discharge: Continued Medical Work up   Patient Goals and CMS Choice Patient states their goals for this hospitalization and ongoing recovery are:: go home      Expected Discharge Plan and Services Expected Discharge Plan: Home/Self Care In-house Referral: Clinical Social Work     Living arrangements for the past 2 months: Single Family Home                                      Prior Living Arrangements/Services Living arrangements for the past 2 months: Single Family Home Lives with:: Spouse Patient language and need for interpreter reviewed:: Yes Do you feel safe going back to the place where you live?: Yes      Need for Family Participation in Patient Care: Yes (Comment)   Current home services: DME Criminal Activity/Legal Involvement Pertinent to Current Situation/Hospitalization: No - Comment as needed  Activities of Daily Living Home Assistive Devices/Equipment: None ADL Screening (condition at time of admission) Patient's cognitive ability adequate to safely complete  daily activities?: No Is the patient deaf or have difficulty hearing?: No Does the patient have difficulty seeing, even when wearing glasses/contacts?: No Does the patient have difficulty concentrating, remembering, or making decisions?: Yes Patient able to express need for assistance with ADLs?: No Does the patient have difficulty dressing or bathing?: Yes Independently performs ADLs?: No Communication: Independent Dressing (OT): Needs assistance Is this a change from baseline?: Change from baseline, expected to last <3days Grooming: Needs assistance Is this a change from baseline?: Change from baseline, expected to last <3 days Feeding: Needs assistance Is this a change from baseline?: Change from baseline, expected to last <3 days Bathing: Needs assistance Is this a change from baseline?: Change from baseline, expected to last <3 days Toileting: Needs assistance Is this a change from baseline?: Change from baseline, expected to last <3 days In/Out Bed: Needs assistance Is this a change from baseline?: Change from baseline, expected to last <3 days Walks in Home: Independent Does the patient have difficulty walking or climbing stairs?: Yes Weakness of Legs: Both Weakness of Arms/Hands: None  Permission Sought/Granted                  Emotional Assessment Appearance:: Appears stated age Attitude/Demeanor/Rapport: Engaged Affect (typically observed): Pleasant Orientation: : Oriented to Self, Oriented to Place, Oriented to  Time, Oriented to Situation Alcohol / Substance Use: Not Applicable  Psych Involvement: No (comment)  Admission diagnosis:  Hepatic encephalopathy [K76.82] Pain in the abdomen [R10.9] Subacute liver failure without hepatic coma [K72.00] Patient Active Problem List   Diagnosis Date Noted   UTI (urinary tract infection) 04/29/2021   Pneumonia 04/29/2021   Stage 3a chronic kidney disease (CKD) ([place]) 04/28/2021   Lactic acidosis 04/28/2021   Hepatic  encephalopathy 04/28/2021   Goals of care, counseling/discussion 48/27/0786   Acute diastolic CHF (congestive heart failure) ([place]) 04/12/2021   S/P TIPS (transjugular intrahepatic portosystemic shunt) 03/05/2021   Encephalopathy, hepatic 02/19/2021   Portal vein thrombosis 02/12/2021   Elevated brain natriuretic peptide (BNP) level 01/27/2021   AKI (acute kidney injury) ([place]) 01/27/2021   Chest pain 01/27/2021   Abdominal pain 01/27/2021   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   Portal hypertension ([place]) 01/27/2021   New onset of congestive heart failure ([place]) 01/27/2021   UGI bleed 10/26/2016   Vitamin B12 deficiency 10/26/2016   AVM (arteriovenous malformation) 10/25/2016   Gastric ulcer    Symptomatic anemia    Dizziness and giddiness 10/12/2016   Hemorrhage of rectum and anus 10/12/2016   Routine general medical examination at a health care facility 09/22/2016   Incisional hernia 05/19/2016   Iron deficiency anemia due to chronic blood loss 05/19/2016   Cirrhosis of liver ([place]) 05/19/2016   Esophageal varices ([place]) 05/19/2016   Body mass index 32.0-32.9, adult 05/19/2016   Anemia 05/16/2016   IDA (iron deficiency anemia) 10/23/2015   Heme positive stool 10/23/2015   Need for prophylactic vaccination and inoculation against single bacterial disease 04/15/2015   Hyperlipidemia 05/27/2014   HTN (hypertension) 05/27/2014   DM (diabetes mellitus) ([place]) 05/27/2014   Liver cirrhosis secondary to NASH ([place]) 05/27/2014   COPD (chronic obstructive pulmonary disease) ([place]) 05/27/2014   Acute cystitis 04/24/2014   Benign essential hypertension 10/04/2013   Hypothyroidism 10/04/2013   Pure hypercholesterolemia 10/04/2013   Other emphysema ([place]) 02/01/2013   Malignant neoplasm of prostate ([place]) 02/01/2013   Diabetes ([place]) 01/08/2013   Prostate cancer ([place]) 01/17/2012   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   Need for prophylactic vaccination and inoculation against influenza 01/17/2012    Obesity 08/20/2010   Thyroid disease 08/20/2010   DM (diabetes mellitus), type 2 ([place]) 08/20/2010   Hypercholesterolemia 08/20/2010   PCP:  [name], MD Pharmacy:   [facility] [address], [place] [address] Phone: [phone] Fax: [phone]     Social Determinants of Health (SDOH) Interventions    Readmission Risk Interventions Readmission Risk Prevention Plan 04/29/2021 04/13/2021  Transportation Screening Complete Complete  HRI or [place] - Complete  Social Work Consult for [place] Planning/Counseling - Complete  Palliative Care Screening - Not Applicable  Medication Review Press photographer) Complete Complete  HRI or Home Care Consult Patient refused -  SW Recovery Care/Counseling Consult Complete -  Palliative Care Screening Not Applicable -  [place] Not Applicable -  Some recent data might be hidden

## 2021-04-29 NOTE — Progress Notes (Signed)
Subjective:    Patient sitting in chair. Conversant. Did not like breakfast, too sweet. Earlier this morning he was complaining of SOB, was tachycardic. Given metoprolol, ABG completed. Heart rate improved. This morning he states his breathing is fine. No abdominal pain. He knows he is in the hospital but thinks he is at [facility] He is oriented to self, time. Large loose brown stool last night.   Objective:   Vital signs in last 24 hours: Temp:  [97.8 F (36.6 C)-98.6 F (37 C)] 98.2 F (36.8 C) (02/09 0505) Pulse Rate:  [70-125] 102 (02/09 0505) Resp:  [18-34] 19 (02/09 0505) BP: (90-128)/(51-100) 107/68 (02/09 0505) SpO2:  [93 %-99 %] 98 % (02/09 0505) Weight:  [86.6 kg] 86.6 kg (02/08 1735)   General:   Alert,  Well-developed, well-nourished, pleasant and cooperative in NAD Head:  Normocephalic and atraumatic. Eyes:  Sclera clear, no icterus.  Chest: Diminished breath sounds in the bases.     Heart:  Regular rate and rhythm; no murmurs, clicks, rubs,  or gallops. Abdomen:  Soft, nontender and nondistended.  Normal bowel sounds, without guarding, and without rebound.   Extremities:  Without clubbing, deformity or edema. Neurologic:  Alert and  oriented to person, time. No asterixis. Skin:  Intact without significant lesions or rashes. Psych:  Alert and cooperative. Normal mood and affect.  Intake/Output from previous day: 02/08 0701 - 02/09 0700 In: 270 [P.O.:270] Out: 600 [Urine:600] Intake/Output this shift: No intake/output data recorded.  Lab Results: CBC Recent Labs    04/27/21 1342 04/28/21 1141 04/29/21 0440  WBC 6.1 5.7 5.1  HGB 12.3* 13.4 11.3*  HCT 36.7* 41.3 34.7*  MCV 93.6 99.3 97.7  PLT 177 175 125*   BMET Recent Labs    04/27/21 1344 04/28/21 1141 04/29/21 0440  NA 135 136 139  K 4.9 4.3 4.1  CL 101 101 108  CO2 26 23 24   GLUCOSE 228* 137* 102*  BUN 17 17 16   CREATININE 1.58* 1.45* 1.29*  CALCIUM 9.2 9.7 9.1   LFTs Recent Labs     04/28/21 1141 04/29/21 0440  BILITOT 3.1* 2.7*  ALKPHOS 142* 108  AST 36 29  ALT 25 21  PROT 7.3 5.9*  ALBUMIN 3.2* 2.6*   No results for input(s): LIPASE in the last 72 hours. PT/INR Recent Labs    04/28/21 1145  LABPROT 16.9*  INR 1.4*    Imaging Studies: CT Head Wo Contrast  Result Date: 04/28/2021 CLINICAL DATA:  Dizziness.  Confusion. EXAM: CT HEAD WITHOUT CONTRAST TECHNIQUE: Contiguous axial images were obtained from the base of the skull through the vertex without intravenous contrast. RADIATION DOSE REDUCTION: This exam was performed according to the departmental dose-optimization program which includes automated exposure control, adjustment of the mA and/or kV according to patient size and/or use of iterative reconstruction technique. COMPARISON:  February 19, 2021. FINDINGS: Brain: Mild chronic ischemic white matter disease is noted. No mass effect or midline shift is noted. Ventricular size is within normal limits. There is no evidence of mass lesion, hemorrhage or acute infarction. Vascular: No hyperdense vessel or unexpected calcification. Skull: Normal. Negative for fracture or focal lesion. Sinuses/Orbits: No acute finding. Other: None. IMPRESSION: No acute intracranial abnormality seen. Electronically Signed   By: Marijo Conception M.D.   On: 04/28/2021 12:56   CT Angio Chest PE W and/or Wo Contrast  Result Date: 04/12/2021 CLINICAL DATA:  Pulmonary embolism suspected, high probability. Shortness of breath since last night. EXAM: CT ANGIOGRAPHY  CHEST WITH CONTRAST TECHNIQUE: Multidetector CT imaging of the chest was performed using the standard protocol during bolus administration of intravenous contrast. Multiplanar CT image reconstructions and MIPs were obtained to evaluate the vascular anatomy. RADIATION DOSE REDUCTION: This exam was performed according to the departmental dose-optimization program which includes automated exposure control, adjustment of the mA and/or kV  according to patient size and/or use of iterative reconstruction technique. CONTRAST:  43mL OMNIPAQUE IOHEXOL 350 MG/ML SOLN COMPARISON:  Radiographs same date. Abdominopelvic CT 02/12/2021. No prior chest CTs are currently available for correlation. FINDINGS: Cardiovascular: The pulmonary arteries are well opacified with contrast to the level of the subsegmental branches. There is no evidence of acute pulmonary embolism. There is atherosclerosis of the aorta, great vessels and coronary arteries. There is limited systemic arterial opacification without evidence of acute vascular abnormality. The heart size is normal. There is no pericardial effusion. Mediastinum/Nodes: There are no enlarged mediastinal, hilar or axillary lymph nodes.Mild nonspecific esophageal wall thickening without significant distension. The thyroid gland and trachea demonstrate no significant findings. Lungs/Pleura: Moderate-sized dependent pleural effusions bilaterally. Evidence of underlying at least moderate centrilobular emphysema with diffuse central airway thickening. There is scattered interstitial opacities throughout both lungs and mild dependent atelectasis, but no confluent airspace opacity or suspicious nodularity. Upper abdomen: TIPS shunt noted in the liver. Patency of this shunt cannot be confirmed on this examination due to limited portal vein opacification. Underlying morphologic changes of cirrhosis and vascular embolization clips are noted. Musculoskeletal/Chest wall: There is no chest wall mass or suspicious osseous finding. Mild degenerative changes in the spine. Bilateral gynecomastia. Review of the MIP images confirms the above findings. IMPRESSION: 1. No evidence of acute pulmonary embolism or other acute vascular findings in the chest. 2. Moderate-sized pleural effusions with diffuse interstitial opacities superimposed on emphysema, suspicious for congestive heart failure. Recommend chest radiographic follow-up. 3.  Coronary and Aortic Atherosclerosis (ICD10-I70.0). Emphysema (ICD10-J43.9). 4. Patency of the hepatic TIPS shunt cannot be addressed by this examination. Electronically Signed   By: Richardean Sale M.D.   On: 04/12/2021 16:16   US Abdomen Complete  Result Date: 04/28/2021 CLINICAL DATA:  Cirrhosis of the liver. EXAM: ABDOMEN ULTRASOUND COMPLETE COMPARISON:  01/27/2021 FINDINGS: Gallbladder: Poorly visualized.  No gross abnormalities seen. Common bile duct: Diameter: 6.8 mm Liver: Mildly echogenic with mildly nodular contours. TIPS visualized. No liver masses seen. Portal vein is patent on color Doppler imaging with normal direction of blood flow towards the liver. IVC: No abnormality visualized. Pancreas: Visualized portion unremarkable. Spleen: Size and appearance within normal limits. Right Kidney: Length: 11.1 cm. Multiple cysts. Normal echotexture. No hydronephrosis. Left Kidney: Length: 12.5 cm. Multiple cysts. Normal echotexture. No hydronephrosis. Abdominal aorta: No aneurysm visualized. Other findings: The examination was limited by bowel gas and breathing with an altered mental status. IMPRESSION: 1. Stable changes of cirrhosis of the liver with no interval masses visualized. 2. Limited examination for reasons discussed above with poor visualization of the gallbladder. Electronically Signed   By: Claudie Revering M.D.   On: 04/28/2021 15:55   DG Chest Port 1 View  Result Date: 04/28/2021 CLINICAL DATA:  Altered mental status. Weakness. Shortness of breath. EXAM: PORTABLE CHEST 1 VIEW COMPARISON:  Radiographs 04/12/2021 and 02/20/2021.  CT 04/12/2021. FINDINGS: 1156 hours. Stable cardiomegaly and aortic atherosclerosis. The previously demonstrated small left pleural effusion has improved. Persistent diffuse increased interstitial markings, likely due to mild edema superimposed on underlying emphysema. No confluent airspace opacity or pneumothorax. The bones appear unchanged. Telemetry leads  overlie the  chest. IMPRESSION: Interval improvement in previously demonstrated small left pleural effusion. Persistent probable mild edema superimposed on emphysema. Electronically Signed   By: Richardean Sale M.D.   On: 04/28/2021 12:15   DG Chest Port 1 View  Result Date: 04/12/2021 CLINICAL DATA:  Shortness of breath EXAM: PORTABLE CHEST 1 VIEW COMPARISON:  02/20/2021, 01/27/2021 FINDINGS: Mild cardiomegaly. Atherosclerotic calcification of the aortic knob. Persistently increased interstitial markings throughout both lungs. Interval development of a small left pleural effusion. Streaky left basilar opacity. No pneumothorax. IMPRESSION: 1. Interval development of a small left pleural effusion with adjacent left basilar atelectasis/consolidation. 2. Persistently increased interstitial markings throughout both lungs may reflect underlying interstitial lung disease or edema. Electronically Signed   By: Davina Poke D.O.   On: 04/12/2021 12:42   ECHOCARDIOGRAM COMPLETE  Result Date: 04/13/2021    ECHOCARDIOGRAM REPORT   Patient Name:   Tyler Farmer Date of Exam: 04/13/2021 Medical Rec #:  [id number]         Height:       69.0 in Accession #:    [id number]        Weight:       196.4 lb Date of Birth:  November 08, 1940        BSA:          2.050 m Patient Age:    81 years          BP:           101/55 mmHg Patient Gender: M                 HR:           104 bpm. Exam Location:  [facility] Procedure: 2D Echo, Cardiac Doppler and Color Doppler Indications:    Chest Pain 786.50 / R07.9  History:        Patient has prior history of Echocardiogram examinations, most                 recent 04/14/2020. COPD; Risk Factors:Hypertension, Diabetes and                 Dyslipidemia. Cirrhosis of liver, Prostate cancer ([place]) (From                 Hx), GERD.  Sonographer:    Alvino Chapel RCS Referring Phys: [place]  1. Left ventricular ejection fraction, by estimation, is 60 to 65%. The left ventricle has normal  function. The left ventricle has no regional wall motion abnormalities. There is mild concentric left ventricular hypertrophy of the basal-septal segment. Diastolic function indeterminant due to AFib.  2. Right ventricular systolic function is normal. The right ventricular size is normal. There is normal pulmonary artery systolic pressure.  3. Left atrial size was severely dilated.  4. Right atrial size was mildly dilated.  5. The mitral valve is grossly normal. Mild to moderate mitral valve regurgitation. Moderate mitral annular calcification.  6. The aortic valve is tricuspid. There is mild calcification of the aortic valve. There is mild thickening of the aortic valve. Aortic valve regurgitation is not visualized. Aortic valve sclerosis/calcification is present, without any evidence of aortic stenosis.  7. The inferior vena cava is dilated in size with <50% respiratory variability, suggesting right atrial pressure of 15 mmHg. Comparison(s): Compared to prior TTE in 03/2020, there is no significant change. FINDINGS  Left Ventricle: Left ventricular ejection fraction, by estimation, is 60 to 65%. The left ventricle has  normal function. The left ventricle has no regional wall motion abnormalities. The left ventricular internal cavity size was normal in size. There is  mild concentric left ventricular hypertrophy of the basal-septal segment. Diastolic function indeterminant due to AFib. Right Ventricle: The right ventricular size is normal. No increase in right ventricular wall thickness. Right ventricular systolic function is normal. There is normal pulmonary artery systolic pressure. The tricuspid regurgitant velocity is 2.10 m/s, and  with an assumed right atrial pressure of 8 mmHg, the estimated right ventricular systolic pressure is 46.9 mmHg. Left Atrium: Left atrial size was severely dilated. Right Atrium: Right atrial size was mildly dilated. Pericardium: There is no evidence of pericardial effusion. Mitral  Valve: The mitral valve is grossly normal. There is mild thickening of the mitral valve leaflet(s). There is mild calcification of the mitral valve leaflet(s). Moderate mitral annular calcification. Mild to moderate mitral valve regurgitation. MV peak gradient, 8.0 mmHg. The mean mitral valve gradient is 2.0 mmHg. Tricuspid Valve: The tricuspid valve is normal in structure. Tricuspid valve regurgitation is mild. Aortic Valve: The aortic valve is tricuspid. There is mild calcification of the aortic valve. There is mild thickening of the aortic valve. Aortic valve regurgitation is not visualized. Aortic valve sclerosis/calcification is present, without any evidence of aortic stenosis. Aortic valve mean gradient measures 4.0 mmHg. Aortic valve peak gradient measures 6.7 mmHg. Aortic valve area, by VTI measures 2.39 cm. Pulmonic Valve: The pulmonic valve was normal in structure. Pulmonic valve regurgitation is trivial. Aorta: The aortic root is normal in size and structure. Venous: The inferior vena cava is dilated in size with less than 50% respiratory variability, suggesting right atrial pressure of 15 mmHg. IAS/Shunts: The atrial septum is grossly normal.  LEFT VENTRICLE PLAX 2D LVIDd:         4.20 cm LVIDs:         2.70 cm LV PW:         1.10 cm LV IVS:        1.10 cm LVOT diam:     2.00 cm LV SV:         61 LV SV Index:   30 LVOT Area:     3.14 cm  RIGHT VENTRICLE TAPSE (M-mode): 1.5 cm LEFT ATRIUM              Index        RIGHT ATRIUM           Index LA diam:        4.40 cm  2.15 cm/m   RA Area:     24.80 cm LA Vol (A2C):   81.3 ml  39.66 ml/m  RA Volume:   75.60 ml  36.88 ml/m LA Vol (A4C):   168.0 ml 81.95 ml/m LA Biplane Vol: 121.0 ml 59.02 ml/m  AORTIC VALVE AV Area (Vmax):    2.26 cm AV Area (Vmean):   2.52 cm AV Area (VTI):     2.39 cm AV Vmax:           129.00 cm/s AV Vmean:          88.450 cm/s AV VTI:            0.256 m AV Peak Grad:      6.7 mmHg AV Mean Grad:      4.0 mmHg LVOT Vmax:          93.00 cm/s LVOT Vmean:        70.950 cm/s LVOT VTI:  0.194 m LVOT/AV VTI ratio: 0.76  AORTA Ao Root diam: 3.40 cm MITRAL VALVE                TRICUSPID VALVE MV Area (PHT): 4.21 cm     TR Peak grad:   17.6 mmHg MV Area VTI:   2.63 cm     TR Vmax:        210.00 cm/s MV Peak grad:  8.0 mmHg MV Mean grad:  2.0 mmHg     SHUNTS MV Vmax:       1.41 m/s     Systemic VTI:  0.19 m MV Vmean:      56.1 cm/s    Systemic Diam: 2.00 cm MV Decel Time: 180 msec MR Peak grad: 91.8 mmHg MR Mean grad: 52.0 mmHg MR Vmax:      479.00 cm/s MR Vmean:     322.0 cm/s MV E velocity: 122.00 cm/s Gwyndolyn Kaufman MD Electronically signed by Gwyndolyn Kaufman MD Signature Date/Time: 04/13/2021/3:41:33 PM    Final   [2 weeks]  Assessment:   81 year old male with known cirrhosis and history of hepatic encephalopathy maintained on lactulose and Xifaxan who presented this admission with signs and symptoms of hepatic encephalopathy.  Also with complaints of coughing and shortness of breath.    Decompensated cirrhosis: Diagnosed with [name] cirrhosis 8 years ago.  Yesterday, MELD 19.  Disease has been complicated by hepatic encephalopathy, portal hypertension/esophageal varices status post banding for primary prophylaxis (February 13, 2021), acute occlusive thrombus involving the portal vein extending into the proximal splenic vein with portal portal splenic venous confluence requiring CT assisted portal vein thrombectomy March 05, 2021.  He is on low-dose aspirin, not a candidate for anticoagulation with history of GI bleeding.  He also had TIPS placement at thime of thrombectomy along with coil embolization of spontaneous splenorenal shunt.  TIPS patency appears to be patent on evaluation this admission.US liver doppler results pending but per Dr. Laural Golden TIPS appears patient. No evidence of GI bleeding.  Suspicion for respiratory infection to account for hepatic decompensation but his U/A now also with concern for UTI. Patient was  started on IV Levaquin yesterday.  Lactic acid 2.9, down from 3.5 on admission.  Hemoglobin 11.3, down from 12.3 on admission however hemoglobin at time of last discharge was 10.9.  Platelets 125,000.  Creatinine down from 1.45-1.29.  Alkaline phos normal now at 108.  Total bilirubin down from 3.1-2.7.   History of gastric ulcer/esophageal varices s/p banding: Surveillance EGD has been postponed.  No evidence of GI bleeding.  Chronic atrial fibrillation: Not a candidate for anticoagulation because of history of GI bleed on more than one occasion.  Seen by cardiology as an outpatient day prior to admission.  Also with history of diastolic CHF.   Cough/shortness of breath/COPD: Negative for influenza and COVID.  Events overnight noted. Chest x-ray yesterday with interval improvement in previously demonstrated small left pleural effusion.  Persistent probable mild edema superimposed on emphysema. Respiratory labs pending. Patient on IV Levaquin.  UTI: U/A with positive nitrite, small leukocytes, rare bacteria, white blood cells 11-20, urine culture pending. On IV Levaquin.  Plan:   Continue lactulose and Xifaxan.  CBC, CMET, PT/INR in AM. F/U pending labs, urine culture.  Monitor for signs of GI bleeding.   LOS: 1 day   Laureen Ochs. Bernarda Caffey [facility] [phone] 2/9/20238:40 AM

## 2021-04-29 NOTE — Progress Notes (Signed)
Writer was called to patient's room by patient's daughter d/t patient stating he can't breath. Patient's VSS 125/85, 98.0, 99%, 18, 122. MD at bedside to assess and ordered ABG. ABG was within normal limits, no new orders given at this time. Will continue to monitor.

## 2021-04-29 NOTE — Plan of Care (Signed)
  Problem: Nutrition: Goal: Adequate nutrition will be maintained Outcome: Progressing   Problem: Coping: Goal: Level of anxiety will decrease Outcome: Progressing   

## 2021-04-30 LAB — BASIC METABOLIC PANEL
Anion gap: 6 (ref 5–15)
BUN: 15 mg/dL (ref 8–23)
CO2: 24 mmol/L (ref 22–32)
Calcium: 8.9 mg/dL (ref 8.9–10.3)
Chloride: 111 mmol/L (ref 98–111)
Creatinine, Ser: 1.3 mg/dL — ABNORMAL HIGH (ref 0.61–1.24)
GFR, Estimated: 56 mL/min — ABNORMAL LOW (ref >60)
Glucose, Bld: 102 mg/dL — ABNORMAL HIGH (ref 70–99)
Potassium: 4.1 mmol/L (ref 3.5–5.1)
Sodium: 141 mmol/L (ref 135–145)

## 2021-04-30 LAB — CBC
HCT: 35.6 % — ABNORMAL LOW (ref 39.0–52.0)
Hemoglobin: 11.5 g/dL — ABNORMAL LOW (ref 13.0–17.0)
MCH: 32 pg (ref 26.0–34.0)
MCHC: 32.3 g/dL (ref 30.0–36.0)
MCV: 99.2 fL (ref 80.0–100.0)
Platelets: 125 10*3/uL — ABNORMAL LOW (ref 150–400)
RBC: 3.59 MIL/uL — ABNORMAL LOW (ref 4.22–5.81)
RDW: 19.6 % — ABNORMAL HIGH (ref 11.5–15.5)
WBC: 3.7 10*3/uL — ABNORMAL LOW (ref 4.0–10.5)
nRBC: 0 % (ref 0.0–0.2)

## 2021-04-30 LAB — HEPATIC FUNCTION PANEL
ALT: 22 U/L (ref 0–44)
AST: 37 U/L (ref 15–41)
Albumin: 2.5 g/dL — ABNORMAL LOW (ref 3.5–5.0)
Alkaline Phosphatase: 106 U/L (ref 38–126)
Bilirubin, Direct: 0.3 mg/dL — ABNORMAL HIGH (ref 0.0–0.2)
Indirect Bilirubin: 2.1 mg/dL — ABNORMAL HIGH (ref 0.3–0.9)
Total Bilirubin: 2.4 mg/dL — ABNORMAL HIGH (ref 0.3–1.2)
Total Protein: 5.6 g/dL — ABNORMAL LOW (ref 6.5–8.1)

## 2021-04-30 LAB — STREP PNEUMONIAE URINARY ANTIGEN: Strep Pneumo Urinary Antigen: NEGATIVE

## 2021-04-30 LAB — GLUCOSE, CAPILLARY
Glucose-Capillary: 84 mg/dL (ref 70–99)
Glucose-Capillary: 104 mg/dL — ABNORMAL HIGH (ref 70–99)
Glucose-Capillary: 161 mg/dL — ABNORMAL HIGH (ref 70–99)
Glucose-Capillary: 177 mg/dL — ABNORMAL HIGH (ref 70–99)
Glucose-Capillary: 167 mg/dL — ABNORMAL HIGH (ref 70–99)
Glucose-Capillary: 178 mg/dL — ABNORMAL HIGH (ref 70–99)

## 2021-04-30 LAB — PROTIME-INR
INR: 1.8 — ABNORMAL HIGH (ref 0.8–1.2)
Prothrombin Time: 20.4 seconds — ABNORMAL HIGH (ref 11.4–15.2)

## 2021-04-30 LAB — URINE CULTURE

## 2021-04-30 LAB — PROCALCITONIN: Procalcitonin: <0.1 ng/mL

## 2021-04-30 LAB — LACTIC ACID, PLASMA: Lactic Acid, Venous: 2.4 mmol/L (ref 0.5–1.9)

## 2021-04-30 MED ORDER — SODIUM CHLORIDE 0.9 % IV SOLN: INTRAVENOUS | Status: AC

## 2021-04-30 MED ORDER — PHYTONADIONE 5 MG PO TABS
5.0000 mg | ORAL_TABLET | Freq: Once | ORAL | Status: AC
  Administered 2021-04-30: 5 mg via ORAL
  Filled 2021-04-30: qty 1

## 2021-04-30 NOTE — Progress Notes (Signed)
Physical Therapy Treatment Patient Details Name: Tyler Farmer MRN: [id number] DOB: 1940/11/07 Today's Date: 04/30/2021   History of Present Illness 81 year old male with history of liver cirrhosis 2/2 NASH with portal hypertension, hepatic encephalopathy, portal vein thrombosis S/P IR thrombectomy 03/05/21, diabetes, COPD, hypertension,Presented to the ED with complaint of diarrhea and altered mental status.  In the ED, lab orders stable with elevated ammonia 2 6, lactic acidosis 3.4, Vitals relatively stable with mild tachycardia afebrile.  Chest x-ray with interval improvement in previously demonstrated a small left pleural effusion, persistent probable mild edema superimposed on emphysema.  CT head no acute finding  IV fluids ordered, lactulose given and admitted for further management    PT Comments    Patient does not require physical assist for bed mobility. He demonstrates good sitting balance and sitting tolerance EOB while competing exercises. He requires intermittent cueing for mechanics/sequencing with exercise. He is able to transfer to standing and ambulate without AD with slight unsteadiness throughout. Patient ambulates with poor foot clearance without loss of balance. He ends session seated in chair with family present. Patient will benefit from continued skilled physical therapy in hospital and recommended venue below to increase strength, balance, endurance for safe ADLs and gait.    Recommendations for follow up therapy are one component of a multi-disciplinary discharge planning process, led by the attending physician.  Recommendations may be updated based on patient status, additional functional criteria and insurance authorization.  Follow Up Recommendations  Home health PT     Assistance Recommended at Discharge Intermittent Supervision/Assistance  Patient can return home with the following A little help with walking and/or transfers;A little help with  bathing/dressing/bathroom;Assistance with cooking/housework;Help with stairs or ramp for entrance   Equipment Recommendations  None recommended by PT    Recommendations for Other Services       Precautions / Restrictions Precautions Precautions: Fall Restrictions Weight Bearing Restrictions: No     Mobility  Bed Mobility Overal bed mobility: Needs Assistance Bed Mobility: Supine to Sit     Supine to sit: Modified independent (Device/Increase time), Supervision     General bed mobility comments: Mild labored movement. Supervision for safety.    Transfers Overall transfer level: Needs assistance Equipment used: None Transfers: Sit to/from Stand, Bed to chair/wheelchair/BSC Sit to Stand: Supervision, Min guard   Step pivot transfers: Supervision, Min guard       General transfer comment: slightly labored without AD, minimally unsteady upon initial standing    Ambulation/Gait Ambulation/Gait assistance: Supervision, Min guard Gait Distance (Feet): 70 Feet Assistive device: None Gait Pattern/deviations: Decreased step length - right, Decreased step length - left, Decreased stride length Gait velocity: decreased     General Gait Details: ambulates with poor foot clearance without AD   Stairs             Wheelchair Mobility    Modified Rankin (Stroke Patients Only)       Balance                                            Cognition Arousal/Alertness: Awake/alert Behavior During Therapy: WFL for tasks assessed/performed, Flat affect Overall Cognitive Status: Within Functional Limits for tasks assessed  Exercises General Exercises - Lower Extremity Long Arc Quad: AROM, Both, 20 reps, Seated Hip Flexion/Marching: AROM, Both, 20 reps, Seated Toe Raises: AROM, Both, 20 reps, Seated Heel Raises: AROM, Both, 20 reps, Seated    General Comments        Pertinent  Vitals/Pain Pain Assessment Pain Assessment: No/denies pain    Home Living                          Prior Function            PT Goals (current goals can now be found in the care plan section) Acute Rehab PT Goals Patient Stated Goal: return home with family to assist PT Goal Formulation: With patient/family Time For Goal Achievement: 05/03/21 Potential to Achieve Goals: Good Progress towards PT goals: Progressing toward goals    Frequency    Min 3X/week      PT Plan Current plan remains appropriate    Co-evaluation              AM-PAC PT "6 Clicks" Mobility   Outcome Measure  Help needed turning from your back to your side while in a flat bed without using bedrails?: None Help needed moving from lying on your back to sitting on the side of a flat bed without using bedrails?: None Help needed moving to and from a bed to a chair (including a wheelchair)?: A Little Help needed standing up from a chair using your arms (e.g., wheelchair or bedside chair)?: A Little Help needed to walk in hospital room?: A Little Help needed climbing 3-5 steps with a railing? : A Lot 6 Click Score: 19    End of Session   Activity Tolerance: Patient tolerated treatment well;Patient limited by fatigue Patient left: in chair;with call bell/phone within reach;with family/visitor present Nurse Communication: Mobility status PT Visit Diagnosis: Unsteadiness on feet (R26.81);Other abnormalities of gait and mobility (R26.89);Muscle weakness (generalized) (M62.81)     Time: 0355-9741 PT Time Calculation (min) (ACUTE ONLY): 14 min  Charges:  $Therapeutic Exercise: 8-22 mins                     1:21 PM, 04/30/21 Mearl Latin PT, DPT Physical Therapist at [facility]

## 2021-04-30 NOTE — Progress Notes (Signed)
PROGRESS NOTE Tyler Farmer  JME:[id number] DOB: 1940/10/17 DOA: 04/28/2021 PCP: [name], MD   Brief Narrative/Hospital Course: Tyler Farmer, 81 y.o. male [name]22 year old male with history of liver cirrhosis 2/2 NASH with portal hypertension, hepatic encephalopathy, portal vein thrombosis S/P IR thrombectomy 03/05/21, diabetes, COPD, hypertension,Presented to the ED with complaint of diarrhea and altered mental status. In the ED, lab orders stable with elevated ammonia 2 6, lactic acidosis 3.4, Vitals relatively stable with mild tachycardia afebrile.  Chest x-ray with interval improvement in previously demonstrated a small left pleural effusion, persistent probable mild edema superimposed on emphysema.  CT head no acute finding IV fluids ordered, lactulose given and admitted for further management.  Patient started on empiric antibiotics for possible pneumonia/UTI     Subjective: Seen and examined this morning.  Wife at the bedside.  Patient is much improved but is still not at baseline as per the wife.  Requesting regular diet. No shortness of breath or tremulous this morning Overnight blood pressure soft this morning, he is afebrile.  Labs relatively stable with creatinine 1.3 but noted bump in INR 1.8 lactic acidosis continues to downtrend.  Assessment and Plan: * Encephalopathy, hepatic- (present on admission) CT head no acute finding.  Ammonia elevated on admission.  Mental status much improved oriented x2-3 but not at baseline, will keep another 24 hours.Continue with lactulose rifaximin-having good bowel movement.  Liver cirrhosis secondary to NASH [facility])- (present on admission) Known liver cirrhosis on lactulose, Lasix Aldactone. History of portal vein thrombosis w/ recent thrombectomy and history esophageal varices s/p banding for primary prophylaxis,followed by GI.  MELD score on admission 19.  GI following on board.INR elevated this morning 1.8 coagulopathy due to  cirrhosis.  Ultrasound abdomen and liver Doppler no acute significant finding.  We will continue with supportive care lactulose rifaximin.   Pneumonia Possible bacterial pneumonia versus bronchitis.  Daughter endorsed patient having some respiratory issues during GI discussion.  He also complains of shortness of breath-repeat chest x-ray 2/9 with similar mild interstitial edema superimposed upon chronic bronchitis slightly increased left balthough x-ray no acute finding given his immunocompromise state due to liver cirrhosis procalcitonin is reassuring.  This morning no shortness of breath.Follow-up urine Legionella and pneumonia and sputum culture  UTI (urinary tract infection) Daughter endorses patient gets frequent UTI.  Continue empiric antibiotics, urine culture pending.   Goals of care, counseling/discussion Code Status discussed with daughter at the bedside and is full code  Lactic acidosis Lactic acid decreasing-again noted very slow clearance due to cirrhosis.Patient on empiric antibiotics due to UTI and possible pneumonia Recent Labs  Lab 04/28/21 1141 04/28/21 1347 04/29/21 0440 04/30/21 0422  LATICACIDVEN 3.5* 3.0* 2.9* 2.4*  PROCALCITON <0.10  --  <0.10 <0.10      Stage 3a chronic kidney disease (CKD) (HCC) Creatinine somewhat elevated on admission up to 1.5 but now is stable at 1.2-1.3.  Gentle IV fluid hydration and can discontinue fluids once tolerating p.o.Baseline around 1.3-1.4. Recent Labs  Lab 04/27/21 1342 04/27/21 1344 04/28/21 1141 04/29/21 0440 04/30/21 0422  BUN 17 17 17 16 15   CREATININE 1.57* 1.58* 1.45* 1.29* 1.30*    Portal hypertension ([place])- (present on admission) See cirrhosis  DM (diabetes mellitus), type 2 ([place]) Blood sugar stable.  Holding oral glipizide, keep on SSI.Last A1c 6.8 back in November Recent Labs  Lab 04/29/21 1115 04/29/21 1610 04/29/21 2007 04/29/21 2350 04/30/21 0502  GLUCAP 162* 122* 139* 131* 84      Hypothyroidism- (present  on admission) TSH stable, continue synthroid   Prostate cancer ([place])- (present on admission) Prostate cancer history,he will follow-up with his urologist  S/P TIPS (transjugular intrahepatic portosystemic shunt) Monitor LFTs.  UGI bleed- (present on admission) History of upper GI bleed history of gastric ulcer and history of esophageal banding.  Stable. Recent Labs  Lab 04/27/21 1342 04/28/21 1141 04/29/21 0440 04/30/21 0422  HGB 12.3* 13.4 11.3* 11.5*  HCT 36.7* 41.3 34.7* 35.6*    COPD (chronic obstructive pulmonary disease) (HCC)- (present on admission) Emphysema on chest x-ray.  Continue empiric antibiotics inhaler incentive spirometry OOB PT OT   HTN (hypertension)- (present on admission) Soft on admission, stable.  Wean off IV fluids   Hyperlipidemia- (present on admission) Continue statin  DVT prophylaxis: SCDs Start: 04/28/21 1546 Code Status:   Code Status: Full Code Family Communication: plan of care discussed with patient at bedside.  Discussed with daughter on admission  Disposition: Currently not medically stable for discharge. Status is: Inpatient Remains inpatient appropriate because: Ongoing confusion due to hepatic encephalopathy. Continue PT OT OOB Anticipating discharge in the morning  Objective: Vitals last 24 hrs: Vitals:   04/29/21 1401 04/29/21 2004 04/30/21 0501 04/30/21 0840  BP: 116/77 120/76 (!) 92/56 95/68  Pulse: 96 92 87 79  Resp:  20 18 18   Temp: 97.8 F (36.6 C) 97.6 F (36.4 C) 97.8 F (36.6 C)   TempSrc: Oral Oral    SpO2: 100% 100% 98% 95%  Weight:      Height:       Weight change:   Physical Examination: General exam: Aa0x2-3, older than stated age, weak appearing. HEENT:Oral mucosa moist, Ear/Nose WNL grossly, dentition normal. Respiratory system: bilaterally diminished, no use of accessory muscle Cardiovascular system: S1 & S2 +, No JVD,. Gastrointestinal system: Abdomen  soft,NT,ND,BS+ Nervous System:Alert, awake, moving extremities and grossly nonfocal Extremities: LE ankle none, distal peripheral pulses palpable.  Skin: No rashes,no icterus. MSK: Normal muscle bulk,tone, power   Medications reviewed:  Scheduled Meds:  aspirin EC  81 mg Oral Q breakfast   furosemide  20 mg Oral Daily   insulin aspart  0-9 Units Subcutaneous Q4H   lactulose  45 g Oral TID   levothyroxine  137 mcg Oral QAC breakfast   metoprolol tartrate  25 mg Oral q1800   metoprolol tartrate  37.5 mg Oral BH-q7a   pantoprazole (PROTONIX) IV  40 mg Intravenous Q12H   rifaximin  550 mg Oral BID   simvastatin  40 mg Oral QHS   Continuous Infusions:  levofloxacin (LEVAQUIN) IV 750 mg (04/28/21 1948)      Diet Order             Diet regular Room service appropriate? Yes; Fluid consistency: Thin  Diet effective now                            Intake/Output Summary (Last 24 hours) at 04/30/2021 0901 Last data filed at 04/30/2021 0600 Gross per 24 hour  Intake 1495.42 ml  Output 100 ml  Net 1395.42 ml   Net IO Since Admission: 1,305.42 mL [04/30/21 0901]  Wt Readings from Last 3 Encounters:  04/28/21 86.6 kg  04/27/21 89 kg  04/22/21 87.5 kg     Unresulted Labs (From admission, onward)     Start     Ordered   04/30/21 5009  Basic metabolic panel  Daily,   R     Question:  Specimen collection  method  Answer:  Lab=Lab collect   04/29/21 0956   04/29/21 0753  Expectorated Sputum Assessment w Gram Stain, Rflx to Resp Cult  ONCE - STAT,   STAT        04/29/21 0752   04/29/21 0753  Legionella Pneumophila Serogp 1 Ur Ag  Once,   R        04/29/21 0752   04/29/21 0753  Strep pneumoniae urinary antigen  Once,   R        04/29/21 0752          Data Reviewed: I have personally reviewed following labs and imaging studies CBC: Recent Labs  Lab 04/27/21 1342 04/28/21 1141 04/29/21 0440 04/30/21 0422  WBC 6.1 5.7 5.1 3.7*  NEUTROABS  --  4.1  --   --   HGB  12.3* 13.4 11.3* 11.5*  HCT 36.7* 41.3 34.7* 35.6*  MCV 93.6 99.3 97.7 99.2  PLT 177 175 125* 017*   Basic Metabolic Panel: Recent Labs  Lab 04/27/21 1342 04/27/21 1344 04/28/21 1141 04/29/21 0440 04/30/21 0422  NA 134* 135 136 139 141  K 4.9 4.9 4.3 4.1 4.1  CL 101 101 101 108 111  CO2 26 26 23 24 24   GLUCOSE 228* 228* 137* 102* 102*  BUN 17 17 17 16 15   CREATININE 1.57* 1.58* 1.45* 1.29* 1.30*  CALCIUM 9.2 9.2 9.7 9.1 8.9   GFR: Estimated Creatinine Clearance: 46.7 mL/min (A) (by C-G formula based on SCr of 1.3 mg/dL (H)). Liver Function Tests: Recent Labs  Lab 04/28/21 1141 04/29/21 0440 04/30/21 0422  AST 36 29 37  ALT 25 21 22   ALKPHOS 142* 108 106  BILITOT 3.1* 2.7* 2.4*  PROT 7.3 5.9* 5.6*  ALBUMIN 3.2* 2.6* 2.5*   No results for input(s): LIPASE, AMYLASE in the last 168 hours. Recent Labs  Lab 04/28/21 1145  AMMONIA 86*   Coagulation Profile: Recent Labs  Lab 04/28/21 1145 04/30/21 0422  INR 1.4* 1.8*   Cardiac Enzymes: No results for input(s): CKTOTAL, CKMB, CKMBINDEX, TROPONINI in the last 168 hours. BNP (last 3 results) No results for input(s): PROBNP in the last 8760 hours. HbA1C: No results for input(s): HGBA1C in the last 72 hours. CBG: Recent Labs  Lab 04/29/21 1610 04/29/21 2007 04/29/21 2350 04/30/21 0502 04/30/21 0743  GLUCAP 122* 139* 131* 84 104*   Lipid Profile: No results for input(s): CHOL, HDL, LDLCALC, TRIG, CHOLHDL, LDLDIRECT in the last 72 hours. Thyroid Function Tests: Recent Labs    04/28/21 1603  TSH 0.962   Anemia Panel: No results for input(s): VITAMINB12, FOLATE, FERRITIN, TIBC, IRON, RETICCTPCT in the last 72 hours. Sepsis Labs: Recent Labs  Lab 04/28/21 1141 04/28/21 1347 04/29/21 0440 04/30/21 0422  PROCALCITON <0.10  --  <0.10 <0.10  LATICACIDVEN 3.5* 3.0* 2.9* 2.4*    Recent Results (from the past 240 hour(s))  Resp Panel by RT-PCR (Flu A&B, Covid) Nasopharyngeal Swab     Status: None    Collection Time: 04/28/21  6:17 PM   Specimen: Nasopharyngeal Swab; Nasopharyngeal(NP) swabs in vial transport medium  Result Value Ref Range Status   SARS Coronavirus 2 by RT PCR NEGATIVE NEGATIVE Final    Comment: (NOTE) SARS-CoV-2 target nucleic acids are NOT DETECTED.  The SARS-CoV-2 RNA is generally detectable in upper respiratory specimens during the acute phase of infection. The lowest concentration of SARS-CoV-2 viral copies this assay can detect is 138 copies/mL. A negative result does not preclude SARS-Cov-2 infection and should not  be used as the sole basis for treatment or other patient management decisions. A negative result may occur with  improper specimen collection/handling, submission of specimen other than nasopharyngeal swab, presence of viral mutation(s) within the areas targeted by this assay, and inadequate number of viral copies(<138 copies/mL). A negative result must be combined with clinical observations, patient history, and epidemiological information. The expected result is Negative.  Fact Sheet for Patients:  EntrepreneurPulse.com.au  Fact Sheet for Healthcare Providers:  IncredibleEmployment.be  This test is no t yet approved or cleared by the [place] FDA and  has been authorized for detection and/or diagnosis of SARS-CoV-2 by FDA under an Emergency Use Authorization (EUA). This EUA will remain  in effect (meaning this test can be used) for the duration of the COVID-19 declaration under Section 564(b)(1) of the Act, 21 U.S.C.section 360bbb-3(b)(1), unless the authorization is terminated  or revoked sooner.       Influenza A by PCR NEGATIVE NEGATIVE Final   Influenza B by PCR NEGATIVE NEGATIVE Final    Comment: (NOTE) The Xpert Xpress SARS-CoV-2/FLU/RSV plus assay is intended as an aid in the diagnosis of influenza from Nasopharyngeal swab specimens and should not be used as a sole basis for treatment.  Nasal washings and aspirates are unacceptable for Xpert Xpress SARS-CoV-2/FLU/RSV testing.  Fact Sheet for Patients: EntrepreneurPulse.com.au  Fact Sheet for Healthcare Providers: IncredibleEmployment.be  This test is not yet approved or cleared by the [place] FDA and has been authorized for detection and/or diagnosis of SARS-CoV-2 by FDA under an Emergency Use Authorization (EUA). This EUA will remain in effect (meaning this test can be used) for the duration of the COVID-19 declaration under Section 564(b)(1) of the Act, 21 U.S.C. section 360bbb-3(b)(1), unless the authorization is terminated or revoked.  Performed at [facility], [address], [place], [place] [address]   Urine Culture     Status: Abnormal   Collection Time: 04/29/21  7:42 AM   Specimen: Urine, Clean Catch  Result Value Ref Range Status   Specimen Description   Final    URINE, CLEAN CATCH Performed at [facility], [address], [place], [place] [address]    Special Requests   Final    NONE Performed at [facility], [address] [place], [place] [address]    Culture MULTIPLE SPECIES PRESENT, SUGGEST RECOLLECTION (A)  Final   Report Status 04/30/2021 FINAL  Final    Antimicrobials: Anti-infectives (From admission, onward)    Start     Dose/Rate Route Frequency Ordered Stop   04/28/21 2200  rifaximin (XIFAXAN) tablet 550 mg        550 mg Oral 2 times daily 04/28/21 1555     04/28/21 1915  levofloxacin (LEVAQUIN) IVPB 750 mg        750 mg 100 mL/hr over 90 Minutes Intravenous Every 48 hours 04/28/21 1829        Culture/Microbiology    Component Value Date/Time   SDES  04/29/2021 0742    URINE, CLEAN CATCH Performed at [facility], [address], [place], [place] [address]    [employer]  04/29/2021 [phone]    NONE Performed at [facility], [address], [place],  [address]    CULT MULTIPLE SPECIES PRESENT, SUGGEST RECOLLECTION (A)  04/29/2021 0742   REPTSTATUS 04/30/2021 FINAL 04/29/2021 0742    Other culture-see note   Radiology Studies: CT Head Wo Contrast  Result Date: 04/28/2021 CLINICAL DATA:  Dizziness.  Confusion. EXAM: CT HEAD WITHOUT CONTRAST  TECHNIQUE: Contiguous axial images were obtained from the base of the skull through the vertex without intravenous contrast. RADIATION DOSE REDUCTION: This exam was performed according to the departmental dose-optimization program which includes automated exposure control, adjustment of the mA and/or kV according to patient size and/or use of iterative reconstruction technique. COMPARISON:  February 19, 2021. FINDINGS: Brain: Mild chronic ischemic white matter disease is noted. No mass effect or midline shift is noted. Ventricular size is within normal limits. There is no evidence of mass lesion, hemorrhage or acute infarction. Vascular: No hyperdense vessel or unexpected calcification. Skull: Normal. Negative for fracture or focal lesion. Sinuses/Orbits: No acute finding. Other: None. IMPRESSION: No acute intracranial abnormality seen. Electronically Signed   By: Marijo Conception M.D.   On: 04/28/2021 12:56   US Abdomen Complete  Result Date: 04/28/2021 CLINICAL DATA:  Cirrhosis of the liver. EXAM: ABDOMEN ULTRASOUND COMPLETE COMPARISON:  01/27/2021 FINDINGS: Gallbladder: Poorly visualized.  No gross abnormalities seen. Common bile duct: Diameter: 6.8 mm Liver: Mildly echogenic with mildly nodular contours. TIPS visualized. No liver masses seen. Portal vein is patent on color Doppler imaging with normal direction of blood flow towards the liver. IVC: No abnormality visualized. Pancreas: Visualized portion unremarkable. Spleen: Size and appearance within normal limits. Right Kidney: Length: 11.1 cm. Multiple cysts. Normal echotexture. No hydronephrosis. Left Kidney: Length: 12.5 cm. Multiple cysts. Normal echotexture. No hydronephrosis. Abdominal aorta: No aneurysm visualized. Other  findings: The examination was limited by bowel gas and breathing with an altered mental status. IMPRESSION: 1. Stable changes of cirrhosis of the liver with no interval masses visualized. 2. Limited examination for reasons discussed above with poor visualization of the gallbladder. Electronically Signed   By: Claudie Revering M.D.   On: 04/28/2021 15:55   DG Chest Port 1 View  Result Date: 04/29/2021 CLINICAL DATA:  Shortness of breath.  Atrial fibrillation.  Asthma. EXAM: PORTABLE CHEST 1 VIEW COMPARISON:  04/28/2021 FINDINGS: Midline trachea. Mild cardiomegaly. Atherosclerosis in the transverse aorta. Similar small left pleural effusion. No pneumothorax. Mild hyperinflation. Diffuse pulmonary interstitial prominence and indistinctness is similar. Slight worsening left and similar right base airspace disease. IMPRESSION: Similar mild interstitial edema superimposed upon chronic bronchitis. Similar small left pleural effusion. Slightly increased left base airspace disease, most likely atelectasis. Right base atelectasis is unchanged. Aortic Atherosclerosis (ICD10-I70.0). Electronically Signed   By: Abigail Miyamoto M.D.   On: 04/29/2021 15:26   DG Chest Port 1 View  Result Date: 04/28/2021 CLINICAL DATA:  Altered mental status. Weakness. Shortness of breath. EXAM: PORTABLE CHEST 1 VIEW COMPARISON:  Radiographs 04/12/2021 and 02/20/2021.  CT 04/12/2021. FINDINGS: 1156 hours. Stable cardiomegaly and aortic atherosclerosis. The previously demonstrated small left pleural effusion has improved. Persistent diffuse increased interstitial markings, likely due to mild edema superimposed on underlying emphysema. No confluent airspace opacity or pneumothorax. The bones appear unchanged. Telemetry leads overlie the chest. IMPRESSION: Interval improvement in previously demonstrated small left pleural effusion. Persistent probable mild edema superimposed on emphysema. Electronically Signed   By: Richardean Sale M.D.   On:  04/28/2021 12:15   US LIVER DOPPLER  Result Date: 04/29/2021 CLINICAL DATA:  Cirrhosis Portal vein thrombosis Status post portal vein thrombectomy and TIPS EXAM: DUPLEX ULTRASOUND OF LIVER TECHNIQUE: Color and duplex Doppler ultrasound was performed to evaluate the hepatic in-flow and out-flow vessels. COMPARISON:  TIPS placement 03/05/2021 FINDINGS: Liver: Nodular hepatic contours.  No focal hepatic lesion. Portal Vein Velocities Main:  80 cm/sec Right: 117 cm/sec Left: 56 cm/sec TIPS: Portal  end: 80 cm/sec Mid: 91 cm/sec Hepatic vein end: 124 cm/sec Hepatic Vein Velocities Right:  94 cm/sec Middle:  42 cm/sec Left:  53 cm/sec IVC: Present and patent with normal respiratory phasicity. Hepatic Artery Velocity:  35 cm/sec Splenic Vein Velocity: Not visualized Portal Vein Occlusion/Thrombus: No Splenic Vein Occlusion/Thrombus: No Ascites: None Varices: None IMPRESSION: 1. Patent portal vein and TIPS. 2. Mildly decreased velocity noted at the portal end of the TIPS. Attention on follow-up recommended. Electronically Signed   By: Miachel Roux M.D.   On: 04/29/2021 14:31     LOS: 2 days   Antonieta Pert, MD [facility]  04/30/2021, 9:01 AM

## 2021-04-30 NOTE — Progress Notes (Signed)
Subjective:    Feels better. Denies shortness of breath. Tolerating food. Improved, oriented to person and place. Knows the year but when asked the month he is somewhat confused. Denies abdominal pain. BM today. No melena, brbpr. He has five stools documented over the last 24 hours.   Objective:   Vital signs in last 24 hours: Temp:  [97.6 F (36.4 C)-97.8 F (36.6 C)] 97.8 F (36.6 C) (02/10 0501) Pulse Rate:  [87-96] 87 (02/10 0501) Resp:  [18-20] 18 (02/10 0501) BP: (92-120)/(56-77) 92/56 (02/10 0501) SpO2:  [98 %-100 %] 98 % (02/10 0501) Last BM Date: 04/29/21 General:   Alert,  elderly, pleasant and cooperative in NAD Head:  Normocephalic and atraumatic. Eyes:  Sclera clear, no icterus.  Chest: diminished breath sounds left base.    Heart:  Regular rate and rhythm; no murmurs, clicks, rubs,  or gallops. Abdomen:  Soft, nontender and nondistended. No masses, hepatosplenomegaly or hernias noted. Normal bowel sounds, without guarding, and without rebound.   Extremities:  Without clubbing, deformity or edema. Neurologic:  Alert and  oriented to person and place, questionable on time, no asterixis. Skin:  Intact without significant lesions or rashes. Psych:  Alert and cooperative. Normal mood and affect.  Intake/Output from previous day: 02/09 0701 - 02/10 0700 In: 1735.4 [P.O.:1320; I.V.:265.4; IV Piggyback:150] Out: 100 [Urine:100] Intake/Output this shift: No intake/output data recorded.  Lab Results: CBC Recent Labs    04/28/21 1141 04/29/21 0440 04/30/21 0422  WBC 5.7 5.1 3.7*  HGB 13.4 11.3* 11.5*  HCT 41.3 34.7* 35.6*  MCV 99.3 97.7 99.2  PLT 175 125* 125*   BMET Recent Labs    04/28/21 1141 04/29/21 0440 04/30/21 0422  NA 136 139 141  K 4.3 4.1 4.1  CL 101 108 111  CO2 23 24 24   GLUCOSE 137* 102* 102*  BUN 17 16 15   CREATININE 1.45* 1.29* 1.30*  CALCIUM 9.7 9.1 8.9   LFTs Recent Labs    04/28/21 1141 04/29/21 0440 04/30/21 0422  BILITOT  3.1* 2.7* 2.4*  BILIDIR  --   --  0.3*  IBILI  --   --  2.1*  ALKPHOS 142* 108 106  AST 36 29 37  ALT 25 21 22   PROT 7.3 5.9* 5.6*  ALBUMIN 3.2* 2.6* 2.5*   No results for input(s): LIPASE in the last 72 hours. PT/INR Recent Labs    04/28/21 1145 04/30/21 0422  LABPROT 16.9* 20.4*  INR 1.4* 1.8*     Imaging Studies: CT Head Wo Contrast  Result Date: 04/28/2021 CLINICAL DATA:  Dizziness.  Confusion. EXAM: CT HEAD WITHOUT CONTRAST TECHNIQUE: Contiguous axial images were obtained from the base of the skull through the vertex without intravenous contrast. RADIATION DOSE REDUCTION: This exam was performed according to the departmental dose-optimization program which includes automated exposure control, adjustment of the mA and/or kV according to patient size and/or use of iterative reconstruction technique. COMPARISON:  February 19, 2021. FINDINGS: Brain: Mild chronic ischemic white matter disease is noted. No mass effect or midline shift is noted. Ventricular size is within normal limits. There is no evidence of mass lesion, hemorrhage or acute infarction. Vascular: No hyperdense vessel or unexpected calcification. Skull: Normal. Negative for fracture or focal lesion. Sinuses/Orbits: No acute finding. Other: None. IMPRESSION: No acute intracranial abnormality seen. Electronically Signed   By: Marijo Conception M.D.   On: 04/28/2021 12:56   CT Angio Chest PE W and/or Wo Contrast  Result Date: 04/12/2021 CLINICAL DATA:  Pulmonary embolism suspected, high probability. Shortness of breath since last night. EXAM: CT ANGIOGRAPHY CHEST WITH CONTRAST TECHNIQUE: Multidetector CT imaging of the chest was performed using the standard protocol during bolus administration of intravenous contrast. Multiplanar CT image reconstructions and MIPs were obtained to evaluate the vascular anatomy. RADIATION DOSE REDUCTION: This exam was performed according to the departmental dose-optimization program which includes  automated exposure control, adjustment of the mA and/or kV according to patient size and/or use of iterative reconstruction technique. CONTRAST:  56mL OMNIPAQUE IOHEXOL 350 MG/ML SOLN COMPARISON:  Radiographs same date. Abdominopelvic CT 02/12/2021. No prior chest CTs are currently available for correlation. FINDINGS: Cardiovascular: The pulmonary arteries are well opacified with contrast to the level of the subsegmental branches. There is no evidence of acute pulmonary embolism. There is atherosclerosis of the aorta, great vessels and coronary arteries. There is limited systemic arterial opacification without evidence of acute vascular abnormality. The heart size is normal. There is no pericardial effusion. Mediastinum/Nodes: There are no enlarged mediastinal, hilar or axillary lymph nodes.Mild nonspecific esophageal wall thickening without significant distension. The thyroid gland and trachea demonstrate no significant findings. Lungs/Pleura: Moderate-sized dependent pleural effusions bilaterally. Evidence of underlying at least moderate centrilobular emphysema with diffuse central airway thickening. There is scattered interstitial opacities throughout both lungs and mild dependent atelectasis, but no confluent airspace opacity or suspicious nodularity. Upper abdomen: TIPS shunt noted in the liver. Patency of this shunt cannot be confirmed on this examination due to limited portal vein opacification. Underlying morphologic changes of cirrhosis and vascular embolization clips are noted. Musculoskeletal/Chest wall: There is no chest wall mass or suspicious osseous finding. Mild degenerative changes in the spine. Bilateral gynecomastia. Review of the MIP images confirms the above findings. IMPRESSION: 1. No evidence of acute pulmonary embolism or other acute vascular findings in the chest. 2. Moderate-sized pleural effusions with diffuse interstitial opacities superimposed on emphysema, suspicious for congestive  heart failure. Recommend chest radiographic follow-up. 3. Coronary and Aortic Atherosclerosis (ICD10-I70.0). Emphysema (ICD10-J43.9). 4. Patency of the hepatic TIPS shunt cannot be addressed by this examination. Electronically Signed   By: Richardean Sale M.D.   On: 04/12/2021 16:16   US Abdomen Complete  Result Date: 04/28/2021 CLINICAL DATA:  Cirrhosis of the liver. EXAM: ABDOMEN ULTRASOUND COMPLETE COMPARISON:  01/27/2021 FINDINGS: Gallbladder: Poorly visualized.  No gross abnormalities seen. Common bile duct: Diameter: 6.8 mm Liver: Mildly echogenic with mildly nodular contours. TIPS visualized. No liver masses seen. Portal vein is patent on color Doppler imaging with normal direction of blood flow towards the liver. IVC: No abnormality visualized. Pancreas: Visualized portion unremarkable. Spleen: Size and appearance within normal limits. Right Kidney: Length: 11.1 cm. Multiple cysts. Normal echotexture. No hydronephrosis. Left Kidney: Length: 12.5 cm. Multiple cysts. Normal echotexture. No hydronephrosis. Abdominal aorta: No aneurysm visualized. Other findings: The examination was limited by bowel gas and breathing with an altered mental status. IMPRESSION: 1. Stable changes of cirrhosis of the liver with no interval masses visualized. 2. Limited examination for reasons discussed above with poor visualization of the gallbladder. Electronically Signed   By: Claudie Revering M.D.   On: 04/28/2021 15:55   DG Chest Port 1 View  Result Date: 04/29/2021 CLINICAL DATA:  Shortness of breath.  Atrial fibrillation.  Asthma. EXAM: PORTABLE CHEST 1 VIEW COMPARISON:  04/28/2021 FINDINGS: Midline trachea. Mild cardiomegaly. Atherosclerosis in the transverse aorta. Similar small left pleural effusion. No pneumothorax. Mild hyperinflation. Diffuse pulmonary interstitial prominence and indistinctness is similar. Slight worsening left and similar right base  airspace disease. IMPRESSION: Similar mild interstitial edema  superimposed upon chronic bronchitis. Similar small left pleural effusion. Slightly increased left base airspace disease, most likely atelectasis. Right base atelectasis is unchanged. Aortic Atherosclerosis (ICD10-I70.0). Electronically Signed   By: Abigail Miyamoto M.D.   On: 04/29/2021 15:26   DG Chest Port 1 View  Result Date: 04/28/2021 CLINICAL DATA:  Altered mental status. Weakness. Shortness of breath. EXAM: PORTABLE CHEST 1 VIEW COMPARISON:  Radiographs 04/12/2021 and 02/20/2021.  CT 04/12/2021. FINDINGS: 1156 hours. Stable cardiomegaly and aortic atherosclerosis. The previously demonstrated small left pleural effusion has improved. Persistent diffuse increased interstitial markings, likely due to mild edema superimposed on underlying emphysema. No confluent airspace opacity or pneumothorax. The bones appear unchanged. Telemetry leads overlie the chest. IMPRESSION: Interval improvement in previously demonstrated small left pleural effusion. Persistent probable mild edema superimposed on emphysema. Electronically Signed   By: Richardean Sale M.D.   On: 04/28/2021 12:15   DG Chest Port 1 View  Result Date: 04/12/2021 CLINICAL DATA:  Shortness of breath EXAM: PORTABLE CHEST 1 VIEW COMPARISON:  02/20/2021, 01/27/2021 FINDINGS: Mild cardiomegaly. Atherosclerotic calcification of the aortic knob. Persistently increased interstitial markings throughout both lungs. Interval development of a small left pleural effusion. Streaky left basilar opacity. No pneumothorax. IMPRESSION: 1. Interval development of a small left pleural effusion with adjacent left basilar atelectasis/consolidation. 2. Persistently increased interstitial markings throughout both lungs may reflect underlying interstitial lung disease or edema. Electronically Signed   By: Davina Poke D.O.   On: 04/12/2021 12:42   ECHOCARDIOGRAM COMPLETE  Result Date: 04/13/2021    ECHOCARDIOGRAM REPORT   Patient Name:   JAMOND NEELS Markham Date of Exam:  04/13/2021 Medical Rec #:  [id number]         Height:       69.0 in Accession #:    [id number]        Weight:       196.4 lb Date of Birth:  07/26/1940        BSA:          2.050 m Patient Age:    15 years          BP:           101/55 mmHg Patient Gender: M                 HR:           104 bpm. Exam Location:  [facility] Procedure: 2D Echo, Cardiac Doppler and Color Doppler Indications:    Chest Pain 786.50 / R07.9  History:        Patient has prior history of Echocardiogram examinations, most                 recent 04/14/2020. COPD; Risk Factors:Hypertension, Diabetes and                 Dyslipidemia. Cirrhosis of liver, Prostate cancer ([place]) (From                 Hx), GERD.  Sonographer:    Alvino Chapel RCS Referring Phys: [place]  1. Left ventricular ejection fraction, by estimation, is 60 to 65%. The left ventricle has normal function. The left ventricle has no regional wall motion abnormalities. There is mild concentric left ventricular hypertrophy of the basal-septal segment. Diastolic function indeterminant due to AFib.  2. Right ventricular systolic function is normal. The right ventricular size is normal. There is normal pulmonary artery systolic pressure.  3. Left atrial size was severely dilated.  4. Right atrial size was mildly dilated.  5. The mitral valve is grossly normal. Mild to moderate mitral valve regurgitation. Moderate mitral annular calcification.  6. The aortic valve is tricuspid. There is mild calcification of the aortic valve. There is mild thickening of the aortic valve. Aortic valve regurgitation is not visualized. Aortic valve sclerosis/calcification is present, without any evidence of aortic stenosis.  7. The inferior vena cava is dilated in size with <50% respiratory variability, suggesting right atrial pressure of 15 mmHg. Comparison(s): Compared to prior TTE in 03/2020, there is no significant change. FINDINGS  Left Ventricle: Left ventricular ejection  fraction, by estimation, is 60 to 65%. The left ventricle has normal function. The left ventricle has no regional wall motion abnormalities. The left ventricular internal cavity size was normal in size. There is  mild concentric left ventricular hypertrophy of the basal-septal segment. Diastolic function indeterminant due to AFib. Right Ventricle: The right ventricular size is normal. No increase in right ventricular wall thickness. Right ventricular systolic function is normal. There is normal pulmonary artery systolic pressure. The tricuspid regurgitant velocity is 2.10 m/s, and  with an assumed right atrial pressure of 8 mmHg, the estimated right ventricular systolic pressure is 42.3 mmHg. Left Atrium: Left atrial size was severely dilated. Right Atrium: Right atrial size was mildly dilated. Pericardium: There is no evidence of pericardial effusion. Mitral Valve: The mitral valve is grossly normal. There is mild thickening of the mitral valve leaflet(s). There is mild calcification of the mitral valve leaflet(s). Moderate mitral annular calcification. Mild to moderate mitral valve regurgitation. MV peak gradient, 8.0 mmHg. The mean mitral valve gradient is 2.0 mmHg. Tricuspid Valve: The tricuspid valve is normal in structure. Tricuspid valve regurgitation is mild. Aortic Valve: The aortic valve is tricuspid. There is mild calcification of the aortic valve. There is mild thickening of the aortic valve. Aortic valve regurgitation is not visualized. Aortic valve sclerosis/calcification is present, without any evidence of aortic stenosis. Aortic valve mean gradient measures 4.0 mmHg. Aortic valve peak gradient measures 6.7 mmHg. Aortic valve area, by VTI measures 2.39 cm. Pulmonic Valve: The pulmonic valve was normal in structure. Pulmonic valve regurgitation is trivial. Aorta: The aortic root is normal in size and structure. Venous: The inferior vena cava is dilated in size with less than 50% respiratory  variability, suggesting right atrial pressure of 15 mmHg. IAS/Shunts: The atrial septum is grossly normal.  LEFT VENTRICLE PLAX 2D LVIDd:         4.20 cm LVIDs:         2.70 cm LV PW:         1.10 cm LV IVS:        1.10 cm LVOT diam:     2.00 cm LV SV:         61 LV SV Index:   30 LVOT Area:     3.14 cm  RIGHT VENTRICLE TAPSE (M-mode): 1.5 cm LEFT ATRIUM              Index        RIGHT ATRIUM           Index LA diam:        4.40 cm  2.15 cm/m   RA Area:     24.80 cm LA Vol (A2C):   81.3 ml  39.66 ml/m  RA Volume:   75.60 ml  36.88 ml/m LA Vol (A4C):   168.0 ml 81.95  ml/m LA Biplane Vol: 121.0 ml 59.02 ml/m  AORTIC VALVE AV Area (Vmax):    2.26 cm AV Area (Vmean):   2.52 cm AV Area (VTI):     2.39 cm AV Vmax:           129.00 cm/s AV Vmean:          88.450 cm/s AV VTI:            0.256 m AV Peak Grad:      6.7 mmHg AV Mean Grad:      4.0 mmHg LVOT Vmax:         93.00 cm/s LVOT Vmean:        70.950 cm/s LVOT VTI:          0.194 m LVOT/AV VTI ratio: 0.76  AORTA Ao Root diam: 3.40 cm MITRAL VALVE                TRICUSPID VALVE MV Area (PHT): 4.21 cm     TR Peak grad:   17.6 mmHg MV Area VTI:   2.63 cm     TR Vmax:        210.00 cm/s MV Peak grad:  8.0 mmHg MV Mean grad:  2.0 mmHg     SHUNTS MV Vmax:       1.41 m/s     Systemic VTI:  0.19 m MV Vmean:      56.1 cm/s    Systemic Diam: 2.00 cm MV Decel Time: 180 msec MR Peak grad: 91.8 mmHg MR Mean grad: 52.0 mmHg MR Vmax:      479.00 cm/s MR Vmean:     322.0 cm/s MV E velocity: 122.00 cm/s Gwyndolyn Kaufman MD Electronically signed by Gwyndolyn Kaufman MD Signature Date/Time: 04/13/2021/3:41:33 PM    Final    US LIVER DOPPLER  Result Date: 04/29/2021 CLINICAL DATA:  Cirrhosis Portal vein thrombosis Status post portal vein thrombectomy and TIPS EXAM: DUPLEX ULTRASOUND OF LIVER TECHNIQUE: Color and duplex Doppler ultrasound was performed to evaluate the hepatic in-flow and out-flow vessels. COMPARISON:  TIPS placement 03/05/2021 FINDINGS: Liver: Nodular hepatic  contours.  No focal hepatic lesion. Portal Vein Velocities Main:  80 cm/sec Right: 117 cm/sec Left: 56 cm/sec TIPS: Portal end: 80 cm/sec Mid: 91 cm/sec Hepatic vein end: 124 cm/sec Hepatic Vein Velocities Right:  94 cm/sec Middle:  42 cm/sec Left:  53 cm/sec IVC: Present and patent with normal respiratory phasicity. Hepatic Artery Velocity:  35 cm/sec Splenic Vein Velocity: Not visualized Portal Vein Occlusion/Thrombus: No Splenic Vein Occlusion/Thrombus: No Ascites: None Varices: None IMPRESSION: 1. Patent portal vein and TIPS. 2. Mildly decreased velocity noted at the portal end of the TIPS. Attention on follow-up recommended. Electronically Signed   By: Miachel Roux M.D.   On: 04/29/2021 14:31  [2 weeks]  Assessment:   81 year old male with known cirrhosis and history of hepatic encephalopathy maintained on lactulose and Xifaxan who presented this admission with signs and symptoms of hepatic encephalopathy.  Also with complaints of coughing and shortness of breath.    Decompensated cirrhosis: MELD 19 today, stable from admission. INR bumped up a bit, Tbili and Cre overall better since admission.  Encephalopathy has improved but not quite at baseline according to family.  He had 5 BMs yesterday.  We will need to monitor closely with goal of 3-4 soft stools daily.  Ultrasound Doppler this admission showed patent portal vein and TIPS.  There is mildly decreased velocity noted at the portal end of the TIPS  which will need attention to follow-up.  He has an appointment next week with interventional radiology, Dr. Jenetta Downer to provide this information to IR.  Suspected source of encephalopathy, infection.  History of gastric ulcer/esophageal varices status post banding: Surveillance EGD previously postponed.  No overt GI bleeding.  Hemoglobin stable at 11.5.  Cough/shortness of breath/COPD: Negative for influenza and COVID.  Overall respiratory status has stabilized.  Chest x-ray this admission with interval  improvement in previously demonstrated small left pleural effusion.  Persistent probable mild edema superimposed on emphysema.  Strep pneumo urinary antigen negative.  Legionella pending.  Patient on IV Levaquin.  UTI: UA positive.  Urine culture showing multiple species, suggesting to recollect.  Plan:   Vitamin K 5 mg oral. Continue lactulose with goal of 4 soft BMs daily. Continue Xifaxan 550 mg twice daily. Consider recollecting urine for culture as outlined above.  Lead to attending's discretion. Hopefully home in the next 24 hours.    LOS: 2 days   Laureen Ochs. [facility] [phone] 2/10/20238:37 AM

## 2021-04-30 NOTE — Progress Notes (Signed)
Occupational Therapy Treatment Patient Details Name: Tyler Farmer MRN: [id number] DOB: [birth year] Today's Date: 04/30/2021   History of present illness 81 year old male with history of liver cirrhosis 2/2 NASH with portal hypertension, hepatic encephalopathy, portal vein thrombosis S/P IR thrombectomy 03/05/21, diabetes, COPD, hypertension,Presented to the ED with complaint of diarrhea and altered mental status.  In the ED, lab orders stable with elevated ammonia 2 6, lactic acidosis 3.4, Vitals relatively stable with mild tachycardia afebrile.  Chest x-ray with interval improvement in previously demonstrated a small left pleural effusion, persistent probable mild edema superimposed on emphysema.  CT head no acute finding  IV fluids ordered, lactulose given and admitted for further management   OT comments  Pt agreeable to OT treatment. Nursing present at start of session giving medications and checking vitals. Nursing reported low BP of 95/68 while pt was seated in chair. Pt reportedly requires assist from wife for donning socks at baseline. Pt needed moderate assist today for donning socks. Pt able to complete functional transfers to standing at sink and sitting at the toilet with supervision to min G assist using gait belt. Pt stood at the sink completing grooming tasks for several minutes with good endurance. Pt was also able to complete x5 consecutive reps of sit to stand from chair without AD with supervision assist. Pt left in chair with chair alarm set and call bell within reach. Pt will benefit from continued OT in the hospital and recommended venue below to increase strength, balance, and endurance for safe ADL's.      Recommendations for follow up therapy are one component of a multi-disciplinary discharge planning process, led by the attending physician.  Recommendations may be updated based on patient status, additional functional criteria and insurance authorization.    Follow Up  Recommendations  Home health OT    Assistance Recommended at Discharge Frequent or constant Supervision/Assistance  Patient can return home with the following  A little help with walking and/or transfers;A little help with bathing/dressing/bathroom;Assistance with cooking/housework;Direct supervision/assist for medications management;Assist for transportation;Help with stairs or ramp for entrance   Equipment Recommendations  BSC/3in1          Precautions / Restrictions Precautions Precautions: Fall Restrictions Weight Bearing Restrictions: No       Mobility Bed Mobility Overal bed mobility: Needs Assistance Bed Mobility: Supine to Sit     Supine to sit: Supervision     General bed mobility comments: Mild labored movement. Supervision for safety.    Transfers Overall transfer level: Needs assistance Equipment used:  (gait belt) Transfers: Sit to/from Stand, Bed to chair/wheelchair/BSC Sit to Stand: Supervision, Min guard     Step pivot transfers: Supervision, Min guard     General transfer comment: Pt able to transfer without RW today. Min G to supervision assist for safety. Pt completed x5 reps of consecutive  sit to stand from chair with more supervision assist.     Balance Overall balance assessment: Needs assistance Sitting-balance support: Feet supported, No upper extremity supported Sitting balance-Leahy Scale: Good Sitting balance - Comments: seated EOB   Standing balance support: No upper extremity supported, During functional activity Standing balance-Leahy Scale: Fair Standing balance comment: without AD                           ADL either performed or assessed with clinical judgement   ADL Overall ADL's : Needs assistance/impaired     Grooming: Wash/dry hands;Applying deodorant;Brushing hair;Supervision/safety;Standing Grooming Details (  indicate cue type and reason): Pt standing at sink with min verbal cuing to brush hair, apply  deodorant, and wash hands.             Lower Body Dressing: Moderate assistance;Minimal assistance;Sitting/lateral leans Lower Body Dressing Details (indicate cue type and reason): Pt assisted to don socks seated in chair. Able to doff without assist. Wife reports she assists with socks at baseline. Toilet Transfer: Supervision/safety;Min guard;Ambulation Toilet Transfer Details (indicate cue type and reason): Chair to toilet using gait belt. Toileting- Clothing Manipulation and Hygiene: Modified independent;Sitting/lateral lean Toileting - Clothing Manipulation Details (indicate cue type and reason): Pt wiping anteriorly after urination.     Functional mobility during ADLs: Supervision/safety;Min guard;Rolling walker (2 wheels)        Cognition Arousal/Alertness: Awake/alert Behavior During Therapy: WFL for tasks assessed/performed, Flat affect Overall Cognitive Status: Within Functional Limits for tasks assessed                                                             Pertinent Vitals/ Pain       Pain Assessment Pain Assessment: No/denies pain                                                          Frequency  Min 2X/week        Progress Toward Goals  OT Goals(current goals can now be found in the care plan section)  Progress towards OT goals: Progressing toward goals  Acute Rehab OT Goals Patient Stated Goal: return home OT Goal Formulation: With patient Time For Goal Achievement: 05/13/21 Potential to Achieve Goals: Good ADL Goals Pt Will Perform Grooming: with modified independence;standing;with adaptive equipment Pt Will Perform Lower Body Dressing: with modified independence;sitting/lateral leans Pt Will Transfer to Toilet: with modified independence;ambulating Pt/caregiver will Perform Home Exercise Program: Increased strength;Both right and left upper extremity;With Supervision  Plan Discharge plan  remains appropriate                                    End of Session Equipment Utilized During Treatment: Gait belt  OT Visit Diagnosis: Unsteadiness on feet (R26.81);Other abnormalities of gait and mobility (R26.89);Muscle weakness (generalized) (M62.81);Other symptoms and signs involving cognitive function   Activity Tolerance Patient tolerated treatment well   Patient Left in chair;with call bell/phone within reach;with chair alarm set   Nurse Communication Other (comment) (notified pt sitting in chair)        Time: 2111-5520 OT Time Calculation (min): 30 min  Charges: OT General Charges $OT Visit: 1 Visit OT Treatments $Self Care/Home Management : 23-37 mins  Kaizlee Carlino OT, MOT  Larey Seat 04/30/2021, 9:22 AM

## 2021-04-30 NOTE — Care Management Important Message (Signed)
Important Message  Patient Details  Name: Tyler Farmer MRN: [id number] Date of Birth: 04-Jun-1940   Medicare Important Message Given:  Yes     [name] 04/30/2021, 2:54 PM

## 2021-05-01 DIAGNOSIS — I482 Chronic atrial fibrillation, unspecified: Secondary | ICD-10-CM

## 2021-05-01 LAB — HEPATIC FUNCTION PANEL
ALT: 28 U/L (ref 0–44)
AST: 54 U/L — ABNORMAL HIGH (ref 15–41)
Albumin: 2.3 g/dL — ABNORMAL LOW (ref 3.5–5.0)
Alkaline Phosphatase: 104 U/L (ref 38–126)
Bilirubin, Direct: 0.3 mg/dL — ABNORMAL HIGH (ref 0.0–0.2)
Indirect Bilirubin: 1.6 mg/dL — ABNORMAL HIGH (ref 0.3–0.9)
Total Bilirubin: 1.9 mg/dL — ABNORMAL HIGH (ref 0.3–1.2)
Total Protein: 5.2 g/dL — ABNORMAL LOW (ref 6.5–8.1)

## 2021-05-01 LAB — BASIC METABOLIC PANEL
Anion gap: 8 (ref 5–15)
BUN: 14 mg/dL (ref 8–23)
CO2: 23 mmol/L (ref 22–32)
Calcium: 8.5 mg/dL — ABNORMAL LOW (ref 8.9–10.3)
Chloride: 108 mmol/L (ref 98–111)
Creatinine, Ser: 1.34 mg/dL — ABNORMAL HIGH (ref 0.61–1.24)
GFR, Estimated: 54 mL/min — ABNORMAL LOW (ref >60)
Glucose, Bld: 118 mg/dL — ABNORMAL HIGH (ref 70–99)
Potassium: 3.7 mmol/L (ref 3.5–5.1)
Sodium: 139 mmol/L (ref 135–145)

## 2021-05-01 LAB — GLUCOSE, CAPILLARY
Glucose-Capillary: 124 mg/dL — ABNORMAL HIGH (ref 70–99)
Glucose-Capillary: 88 mg/dL (ref 70–99)

## 2021-05-01 LAB — PROTIME-INR
INR: 1.8 — ABNORMAL HIGH (ref 0.8–1.2)
Prothrombin Time: 20.5 seconds — ABNORMAL HIGH (ref 11.4–15.2)

## 2021-05-01 MED ORDER — LEVOFLOXACIN 750 MG PO TABS: 750.0000 mg | ORAL_TABLET | Freq: Every day | ORAL | 0 refills | Status: AC

## 2021-05-01 NOTE — Discharge Summary (Incomplete)
Physician Discharge Summary   Patient: Tyler Farmer MRN: [id number] DOB: [birth year]  Admit date:     04/28/2021  Discharge date: 05/01/21  Discharge Physician: Antonieta Pert   PCP: [name], MD   Recommendations at discharge:  {Tip this will not be part of the note when signed- Example include specific recommendations for outpatient follow-up, pending tests to follow-up on. (Optional):26781}  ***  Discharge Diagnoses: Principal Problem:   Encephalopathy, hepatic Active Problems:   Liver cirrhosis secondary to NASH [facility])   Atrial fibrillation, chronic (HCC)   UTI (urinary tract infection)   Pneumonia   Hyperlipidemia   HTN (hypertension)   COPD (chronic obstructive pulmonary disease) (HCC)   UGI bleed   S/P TIPS (transjugular intrahepatic portosystemic shunt)   Prostate cancer (HCC)   Hypothyroidism   DM (diabetes mellitus), type 2 (HCC)   Portal hypertension (HCC)   Stage 3a chronic kidney disease (CKD) (HCC)   Lactic acidosis   Goals of care, counseling/discussion  Resolved Problems:   * No resolved hospital problems. [facility] Course: [name] year old male with history of liver cirrhosis 2/2 NASH with portal hypertension, hepatic encephalopathy, portal vein thrombosis S/P IR thrombectomy 03/05/21, diabetes, COPD, hypertension,Presented to the ED with complaint of diarrhea and altered mental status. In the ED, lab orders stable with elevated ammonia 2 6, lactic acidosis 3.4, Vitals relatively stable with mild tachycardia afebrile.  Chest x-ray with interval improvement in previously demonstrated a small left pleural effusion, persistent probable mild edema superimposed on emphysema.  CT head no acute finding IV fluids ordered, lactulose given and admitted for further management.  Patient started on empiric antibiotics for possible pneumonia/UTI. Urine culture with multiple species.Mental status significantly improved.  He worked with PT OT suggested home health.   This time is medically stable.  Assessment and Plan: * Encephalopathy, hepatic- (present on admission) CT head no acute finding.  Ammonia elevated on admission.  Overall much improved.  Continue on his lactulose rifaximin and will discharge home with home health PT OT  Atrial fibrillation, chronic (HCC) A-fib with heart rate at times in RVR.  Now relatively controlled on home metoprolol.  Not on anticoagulation due to GI bleeding history  Liver cirrhosis secondary to NASH [facility])- (present on admission) Known liver cirrhosis on lactulose, Lasix Aldactone. History of portal vein thrombosis w/ recent thrombectomy and history esophageal varices s/p banding for primary prophylaxis,followed by GI.  MELD score on admission 19.  GI following on board.INR elevated this morning 1.8 coagulopathy due to cirrhosis.  Ultrasound abdomen and liver Doppler no acute significant finding.  Continue on current supportive care with lactulose rifaximin.   Pneumonia Possible bacterial pneumonia versus bronchitis.  Suspect likely bronchitis.  Respiratory status at this time is stable.  Continue antibiotics x5 days.   UTI (urinary tract infection) Daughter endorses patient gets frequent UTI.  Continue empiric antibiotics, urine culture multiple species.  Discharge  to complete 5 days course   Goals of care, counseling/discussion Code Status discussed with daughter at the bedside and is full code  Lactic acidosis Lactic acid decreasing -this is due to hepatic abnormality and low clearance Recent Labs  Lab 04/28/21 1141 04/28/21 1347 04/29/21 0440 04/30/21 0422  LATICACIDVEN 3.5* 3.0* 2.9* 2.4*  PROCALCITON <0.10  --  <0.10 <0.10      Stage 3a chronic kidney disease (CKD) (HCC) Creatinine somewhat elevated on admission up to 1.5 but now is stable at 1.2-1.3.  Encourage oral intake.  Off IV fluids. Recent  Labs  Lab 04/27/21 1344 04/28/21 1141 04/29/21 0440 04/30/21 0422 05/01/21 0156  BUN 17 17 16 15  14   CREATININE 1.58* 1.45* 1.29* 1.30* 1.34*    Portal hypertension ([place])- (present on admission) See cirrhosis  DM (diabetes mellitus), type 2 ([place]) Blood sugar stable.  Holding oral glipizide-resume upon discharge.Last A1c 6.8 back in November Recent Labs  Lab 04/30/21 1119 04/30/21 1622 04/30/21 1957 04/30/21 2123 05/01/21 0422  GLUCAP 161* 177* 167* 178* 124*     Hypothyroidism- (present on admission) TSH stable, continue synthroid   Prostate cancer ([place])- (present on admission) Prostate cancer history,he will follow-up with his urologist  S/P TIPS (transjugular intrahepatic portosystemic shunt) Monitor LFTs.  UGI bleed- (present on admission) History of upper GI bleed history of gastric ulcer and history of esophageal banding.  Stable. Recent Labs  Lab 04/27/21 1342 04/28/21 1141 04/29/21 0440 04/30/21 0422  HGB 12.3* 13.4 11.3* 11.5*  HCT 36.7* 41.3 34.7* 35.6*    COPD (chronic obstructive pulmonary disease) (HCC)- (present on admission) Emphysema on chest x-ray.  Continue empiric antibiotics inhaler incentive spirometry OOB PT OT   HTN (hypertension)- (present on admission) Soft on admission, stable. off IV fluids   Hyperlipidemia- (present on admission) Continue statin       {Tip this will not be part of the note when signed Body mass index is 30.81 kg/m. , ,  (Optional):26781}  {(NOTE) Pain control PDMP Statment (Optional):26782}  Consultants: *** Procedures performed: ***  Disposition: {Plan; Disposition:26390} Diet recommendation:  {Diet_Plan:26776}  DISCHARGE MEDICATION: Allergies as of 05/01/2021       Reactions   Penicillins Nausea And Vomiting   Has patient had a PCN reaction causing immediate rash, facial/tongue/throat swelling, SOB or lightheadedness with hypotension: Yes Has patient had a PCN reaction causing severe rash involving mucus membranes or skin necrosis: No Has patient had a PCN reaction that required  hospitalization: No Has patient had a PCN reaction occurring within the last 10 years: No If all of the above answers are "NO", then may proceed with Cephalosporin use. Patient states that it also caused pain in his sides.        Medication List     STOP taking these medications    furosemide 20 MG tablet Commonly known as: LASIX       TAKE these medications    acetaminophen 500 MG tablet Commonly known as: TYLENOL Take 500 mg by mouth every 6 (six) hours as needed for mild pain.   aspirin EC 81 MG tablet Take 1 tablet (81 mg total) by mouth daily with breakfast. Swallow whole.   Cranberry 500 MG Tabs Take 500 mg by mouth daily.   ferrous sulfate 325 (65 FE) MG tablet Take 325 mg by mouth daily with breakfast.   glipiZIDE 10 MG tablet Commonly known as: GLUCOTROL Take 10 mg by mouth 2 (two) times daily before a meal.   lactulose 10 GM/15ML solution Commonly known as: CHRONULAC TAKE 45 ML BY MOUTH TWICE DAILY What changed: See the new instructions.   levofloxacin 750 MG tablet Commonly known as: Levaquin Take 1 tablet (750 mg total) by mouth daily for 1 day.   levothyroxine 137 MCG tablet Commonly known as: SYNTHROID Take 137 mcg by mouth daily before breakfast.   magnesium oxide 400 MG tablet Commonly known as: MAG-OX Take 400 mg by mouth daily.   metoprolol tartrate 25 MG tablet Commonly known as: LOPRESSOR Take 1.5 tablets (37.5 mg total) by mouth every morning. & 25 mg  in the evening   ondansetron 4 MG tablet Commonly known as: ZOFRAN Take 1 tablet (4 mg total) by mouth every 8 (eight) hours as needed for nausea or vomiting.   pantoprazole 40 MG tablet Commonly known as: PROTONIX Take 1 tablet (40 mg total) by mouth 2 (two) times daily.   rifaximin 550 MG Tabs tablet Commonly known as: XIFAXAN Take 550 mg by mouth 2 (two) times daily.   simvastatin 40 MG tablet Commonly known as: ZOCOR Take 40 mg by mouth at bedtime.   spironolactone 50 MG  tablet Commonly known as: ALDACTONE Take 50 mg by mouth daily.        Follow-up Information     Sasser, Silvestre Moment, MD Follow up in 1 week(s).   Specialty: Family Medicine Contact information: [place] [address] [phone]         Arnoldo Lenis, MD .   Specialty: Cardiology Contact information: [place] [address] [phone]                 Discharge Exam: Danley Danker Weights   04/28/21 1735  Weight: 86.6 kg   ***  Condition at discharge: {DC Condition:26389}  The results of significant diagnostics from this hospitalization (including imaging, microbiology, ancillary and laboratory) are listed below for reference.   Imaging Studies: CT Head Wo Contrast  Result Date: 04/28/2021 CLINICAL DATA:  Dizziness.  Confusion. EXAM: CT HEAD WITHOUT CONTRAST TECHNIQUE: Contiguous axial images were obtained from the base of the skull through the vertex without intravenous contrast. RADIATION DOSE REDUCTION: This exam was performed according to the departmental dose-optimization program which includes automated exposure control, adjustment of the mA and/or kV according to patient size and/or use of iterative reconstruction technique. COMPARISON:  February 19, 2021. FINDINGS: Brain: Mild chronic ischemic white matter disease is noted. No mass effect or midline shift is noted. Ventricular size is within normal limits. There is no evidence of mass lesion, hemorrhage or acute infarction. Vascular: No hyperdense vessel or unexpected calcification. Skull: Normal. Negative for fracture or focal lesion. Sinuses/Orbits: No acute finding. Other: None. IMPRESSION: No acute intracranial abnormality seen. Electronically Signed   By: Marijo Conception M.D.   On: 04/28/2021 12:56   CT Angio Chest PE W and/or Wo Contrast  Result Date: 04/12/2021 CLINICAL DATA:  Pulmonary embolism suspected, high probability. Shortness of breath since last night. EXAM: CT  ANGIOGRAPHY CHEST WITH CONTRAST TECHNIQUE: Multidetector CT imaging of the chest was performed using the standard protocol during bolus administration of intravenous contrast. Multiplanar CT image reconstructions and MIPs were obtained to evaluate the vascular anatomy. RADIATION DOSE REDUCTION: This exam was performed according to the departmental dose-optimization program which includes automated exposure control, adjustment of the mA and/or kV according to patient size and/or use of iterative reconstruction technique. CONTRAST:  68m OMNIPAQUE IOHEXOL 350 MG/ML SOLN COMPARISON:  Radiographs same date. Abdominopelvic CT 02/12/2021. No prior chest CTs are currently available for correlation. FINDINGS: Cardiovascular: The pulmonary arteries are well opacified with contrast to the level of the subsegmental branches. There is no evidence of acute pulmonary embolism. There is atherosclerosis of the aorta, great vessels and coronary arteries. There is limited systemic arterial opacification without evidence of acute vascular abnormality. The heart size is normal. There is no pericardial effusion. Mediastinum/Nodes: There are no enlarged mediastinal, hilar or axillary lymph nodes.Mild nonspecific esophageal wall thickening without significant distension. The thyroid gland and trachea demonstrate no significant findings. Lungs/Pleura: Moderate-sized  dependent pleural effusions bilaterally. Evidence of underlying at least moderate centrilobular emphysema with diffuse central airway thickening. There is scattered interstitial opacities throughout both lungs and mild dependent atelectasis, but no confluent airspace opacity or suspicious nodularity. Upper abdomen: TIPS shunt noted in the liver. Patency of this shunt cannot be confirmed on this examination due to limited portal vein opacification. Underlying morphologic changes of cirrhosis and vascular embolization clips are noted. Musculoskeletal/Chest wall: There is no chest  wall mass or suspicious osseous finding. Mild degenerative changes in the spine. Bilateral gynecomastia. Review of the MIP images confirms the above findings. IMPRESSION: 1. No evidence of acute pulmonary embolism or other acute vascular findings in the chest. 2. Moderate-sized pleural effusions with diffuse interstitial opacities superimposed on emphysema, suspicious for congestive heart failure. Recommend chest radiographic follow-up. 3. Coronary and Aortic Atherosclerosis (ICD10-I70.0). Emphysema (ICD10-J43.9). 4. Patency of the hepatic TIPS shunt cannot be addressed by this examination. Electronically Signed   By: Richardean Sale M.D.   On: 04/12/2021 16:16   US Abdomen Complete  Result Date: 04/28/2021 CLINICAL DATA:  Cirrhosis of the liver. EXAM: ABDOMEN ULTRASOUND COMPLETE COMPARISON:  01/27/2021 FINDINGS: Gallbladder: Poorly visualized.  No gross abnormalities seen. Common bile duct: Diameter: 6.8 mm Liver: Mildly echogenic with mildly nodular contours. TIPS visualized. No liver masses seen. Portal vein is patent on color Doppler imaging with normal direction of blood flow towards the liver. IVC: No abnormality visualized. Pancreas: Visualized portion unremarkable. Spleen: Size and appearance within normal limits. Right Kidney: Length: 11.1 cm. Multiple cysts. Normal echotexture. No hydronephrosis. Left Kidney: Length: 12.5 cm. Multiple cysts. Normal echotexture. No hydronephrosis. Abdominal aorta: No aneurysm visualized. Other findings: The examination was limited by bowel gas and breathing with an altered mental status. IMPRESSION: 1. Stable changes of cirrhosis of the liver with no interval masses visualized. 2. Limited examination for reasons discussed above with poor visualization of the gallbladder. Electronically Signed   By: Claudie Revering M.D.   On: 04/28/2021 15:55   DG Chest Port 1 View  Result Date: 04/29/2021 CLINICAL DATA:  Shortness of breath.  Atrial fibrillation.  Asthma. EXAM: PORTABLE  CHEST 1 VIEW COMPARISON:  04/28/2021 FINDINGS: Midline trachea. Mild cardiomegaly. Atherosclerosis in the transverse aorta. Similar small left pleural effusion. No pneumothorax. Mild hyperinflation. Diffuse pulmonary interstitial prominence and indistinctness is similar. Slight worsening left and similar right base airspace disease. IMPRESSION: Similar mild interstitial edema superimposed upon chronic bronchitis. Similar small left pleural effusion. Slightly increased left base airspace disease, most likely atelectasis. Right base atelectasis is unchanged. Aortic Atherosclerosis (ICD10-I70.0). Electronically Signed   By: Abigail Miyamoto M.D.   On: 04/29/2021 15:26   DG Chest Port 1 View  Result Date: 04/28/2021 CLINICAL DATA:  Altered mental status. Weakness. Shortness of breath. EXAM: PORTABLE CHEST 1 VIEW COMPARISON:  Radiographs 04/12/2021 and 02/20/2021.  CT 04/12/2021. FINDINGS: 1156 hours. Stable cardiomegaly and aortic atherosclerosis. The previously demonstrated small left pleural effusion has improved. Persistent diffuse increased interstitial markings, likely due to mild edema superimposed on underlying emphysema. No confluent airspace opacity or pneumothorax. The bones appear unchanged. Telemetry leads overlie the chest. IMPRESSION: Interval improvement in previously demonstrated small left pleural effusion. Persistent probable mild edema superimposed on emphysema. Electronically Signed   By: Richardean Sale M.D.   On: 04/28/2021 12:15   DG Chest Port 1 View  Result Date: 04/12/2021 CLINICAL DATA:  Shortness of breath EXAM: PORTABLE CHEST 1 VIEW COMPARISON:  02/20/2021, 01/27/2021 FINDINGS: Mild cardiomegaly. Atherosclerotic calcification of the aortic knob. Persistently  increased interstitial markings throughout both lungs. Interval development of a small left pleural effusion. Streaky left basilar opacity. No pneumothorax. IMPRESSION: 1. Interval development of a small left pleural effusion with  adjacent left basilar atelectasis/consolidation. 2. Persistently increased interstitial markings throughout both lungs may reflect underlying interstitial lung disease or edema. Electronically Signed   By: Davina Poke D.O.   On: 04/12/2021 12:42   ECHOCARDIOGRAM COMPLETE  Result Date: 04/13/2021    ECHOCARDIOGRAM REPORT   Patient Name:   OWYN RAULSTON Schmader Date of Exam: 04/13/2021 Medical Rec #:  [id number]         Height:       69.0 in Accession #:    [id number]        Weight:       196.4 lb Date of Birth:  10-13-1940        BSA:          2.050 m Patient Age:    38 years          BP:           101/55 mmHg Patient Gender: M                 HR:           104 bpm. Exam Location:  [facility] Procedure: 2D Echo, Cardiac Doppler and Color Doppler Indications:    Chest Pain 786.50 / R07.9  History:        Patient has prior history of Echocardiogram examinations, most                 recent 04/14/2020. COPD; Risk Factors:Hypertension, Diabetes and                 Dyslipidemia. Cirrhosis of liver, Prostate cancer ([place]) (From                 Hx), GERD.  Sonographer:    Alvino Chapel RCS Referring Phys: [place]  1. Left ventricular ejection fraction, by estimation, is 60 to 65%. The left ventricle has normal function. The left ventricle has no regional wall motion abnormalities. There is mild concentric left ventricular hypertrophy of the basal-septal segment. Diastolic function indeterminant due to AFib.  2. Right ventricular systolic function is normal. The right ventricular size is normal. There is normal pulmonary artery systolic pressure.  3. Left atrial size was severely dilated.  4. Right atrial size was mildly dilated.  5. The mitral valve is grossly normal. Mild to moderate mitral valve regurgitation. Moderate mitral annular calcification.  6. The aortic valve is tricuspid. There is mild calcification of the aortic valve. There is mild thickening of the aortic valve. Aortic valve  regurgitation is not visualized. Aortic valve sclerosis/calcification is present, without any evidence of aortic stenosis.  7. The inferior vena cava is dilated in size with <50% respiratory variability, suggesting right atrial pressure of 15 mmHg. Comparison(s): Compared to prior TTE in 03/2020, there is no significant change. FINDINGS  Left Ventricle: Left ventricular ejection fraction, by estimation, is 60 to 65%. The left ventricle has normal function. The left ventricle has no regional wall motion abnormalities. The left ventricular internal cavity size was normal in size. There is  mild concentric left ventricular hypertrophy of the basal-septal segment. Diastolic function indeterminant due to AFib. Right Ventricle: The right ventricular size is normal. No increase in right ventricular wall thickness. Right ventricular systolic function is normal. There is normal pulmonary artery systolic pressure. The tricuspid regurgitant  velocity is 2.10 m/s, and  with an assumed right atrial pressure of 8 mmHg, the estimated right ventricular systolic pressure is 78.2 mmHg. Left Atrium: Left atrial size was severely dilated. Right Atrium: Right atrial size was mildly dilated. Pericardium: There is no evidence of pericardial effusion. Mitral Valve: The mitral valve is grossly normal. There is mild thickening of the mitral valve leaflet(s). There is mild calcification of the mitral valve leaflet(s). Moderate mitral annular calcification. Mild to moderate mitral valve regurgitation. MV peak gradient, 8.0 mmHg. The mean mitral valve gradient is 2.0 mmHg. Tricuspid Valve: The tricuspid valve is normal in structure. Tricuspid valve regurgitation is mild. Aortic Valve: The aortic valve is tricuspid. There is mild calcification of the aortic valve. There is mild thickening of the aortic valve. Aortic valve regurgitation is not visualized. Aortic valve sclerosis/calcification is present, without any evidence of aortic stenosis.  Aortic valve mean gradient measures 4.0 mmHg. Aortic valve peak gradient measures 6.7 mmHg. Aortic valve area, by VTI measures 2.39 cm. Pulmonic Valve: The pulmonic valve was normal in structure. Pulmonic valve regurgitation is trivial. Aorta: The aortic root is normal in size and structure. Venous: The inferior vena cava is dilated in size with less than 50% respiratory variability, suggesting right atrial pressure of 15 mmHg. IAS/Shunts: The atrial septum is grossly normal.  LEFT VENTRICLE PLAX 2D LVIDd:         4.20 cm LVIDs:         2.70 cm LV PW:         1.10 cm LV IVS:        1.10 cm LVOT diam:     2.00 cm LV SV:         61 LV SV Index:   30 LVOT Area:     3.14 cm  RIGHT VENTRICLE TAPSE (M-mode): 1.5 cm LEFT ATRIUM              Index        RIGHT ATRIUM           Index LA diam:        4.40 cm  2.15 cm/m   RA Area:     24.80 cm LA Vol (A2C):   81.3 ml  39.66 ml/m  RA Volume:   75.60 ml  36.88 ml/m LA Vol (A4C):   168.0 ml 81.95 ml/m LA Biplane Vol: 121.0 ml 59.02 ml/m  AORTIC VALVE AV Area (Vmax):    2.26 cm AV Area (Vmean):   2.52 cm AV Area (VTI):     2.39 cm AV Vmax:           129.00 cm/s AV Vmean:          88.450 cm/s AV VTI:            0.256 m AV Peak Grad:      6.7 mmHg AV Mean Grad:      4.0 mmHg LVOT Vmax:         93.00 cm/s LVOT Vmean:        70.950 cm/s LVOT VTI:          0.194 m LVOT/AV VTI ratio: 0.76  AORTA Ao Root diam: 3.40 cm MITRAL VALVE                TRICUSPID VALVE MV Area (PHT): 4.21 cm     TR Peak grad:   17.6 mmHg MV Area VTI:   2.63 cm     TR Vmax:  210.00 cm/s MV Peak grad:  8.0 mmHg MV Mean grad:  2.0 mmHg     SHUNTS MV Vmax:       1.41 m/s     Systemic VTI:  0.19 m MV Vmean:      56.1 cm/s    Systemic Diam: 2.00 cm MV Decel Time: 180 msec MR Peak grad: 91.8 mmHg MR Mean grad: 52.0 mmHg MR Vmax:      479.00 cm/s MR Vmean:     322.0 cm/s MV E velocity: 122.00 cm/s Gwyndolyn Kaufman MD Electronically signed by Gwyndolyn Kaufman MD Signature Date/Time: 04/13/2021/3:41:33  PM    Final    US LIVER DOPPLER  Result Date: 04/29/2021 CLINICAL DATA:  Cirrhosis Portal vein thrombosis Status post portal vein thrombectomy and TIPS EXAM: DUPLEX ULTRASOUND OF LIVER TECHNIQUE: Color and duplex Doppler ultrasound was performed to evaluate the hepatic in-flow and out-flow vessels. COMPARISON:  TIPS placement 03/05/2021 FINDINGS: Liver: Nodular hepatic contours.  No focal hepatic lesion. Portal Vein Velocities Main:  80 cm/sec Right: 117 cm/sec Left: 56 cm/sec TIPS: Portal end: 80 cm/sec Mid: 91 cm/sec Hepatic vein end: 124 cm/sec Hepatic Vein Velocities Right:  94 cm/sec Middle:  42 cm/sec Left:  53 cm/sec IVC: Present and patent with normal respiratory phasicity. Hepatic Artery Velocity:  35 cm/sec Splenic Vein Velocity: Not visualized Portal Vein Occlusion/Thrombus: No Splenic Vein Occlusion/Thrombus: No Ascites: None Varices: None IMPRESSION: 1. Patent portal vein and TIPS. 2. Mildly decreased velocity noted at the portal end of the TIPS. Attention on follow-up recommended. Electronically Signed   By: Miachel Roux M.D.   On: 04/29/2021 14:31    Microbiology: Results for orders placed or performed during the hospital encounter of 04/28/21  Resp Panel by RT-PCR (Flu A&B, Covid) Nasopharyngeal Swab     Status: None   Collection Time: 04/28/21  6:17 PM   Specimen: Nasopharyngeal Swab; Nasopharyngeal(NP) swabs in vial transport medium  Result Value Ref Range Status   SARS Coronavirus 2 by RT PCR NEGATIVE NEGATIVE Final    Comment: (NOTE) SARS-CoV-2 target nucleic acids are NOT DETECTED.  The SARS-CoV-2 RNA is generally detectable in upper respiratory specimens during the acute phase of infection. The lowest concentration of SARS-CoV-2 viral copies this assay can detect is 138 copies/mL. A negative result does not preclude SARS-Cov-2 infection and should not be used as the sole basis for treatment or other patient management decisions. A negative result may occur with  improper  specimen collection/handling, submission of specimen other than nasopharyngeal swab, presence of viral mutation(s) within the areas targeted by this assay, and inadequate number of viral copies(<138 copies/mL). A negative result must be combined with clinical observations, patient history, and epidemiological information. The expected result is Negative.  Fact Sheet for Patients:  EntrepreneurPulse.com.au  Fact Sheet for Healthcare Providers:  IncredibleEmployment.be  This test is no t yet approved or cleared by the [place] FDA and  has been authorized for detection and/or diagnosis of SARS-CoV-2 by FDA under an Emergency Use Authorization (EUA). This EUA will remain  in effect (meaning this test can be used) for the duration of the COVID-19 declaration under Section 564(b)(1) of the Act, 21 U.S.C.section 360bbb-3(b)(1), unless the authorization is terminated  or revoked sooner.       Influenza A by PCR NEGATIVE NEGATIVE Final   Influenza B by PCR NEGATIVE NEGATIVE Final    Comment: (NOTE) The Xpert Xpress SARS-CoV-2/FLU/RSV plus assay is intended as an aid in the diagnosis of influenza from Nasopharyngeal  swab specimens and should not be used as a sole basis for treatment. Nasal washings and aspirates are unacceptable for Xpert Xpress SARS-CoV-2/FLU/RSV testing.  Fact Sheet for Patients: EntrepreneurPulse.com.au  Fact Sheet for Healthcare Providers: IncredibleEmployment.be  This test is not yet approved or cleared by the [place] FDA and has been authorized for detection and/or diagnosis of SARS-CoV-2 by FDA under an Emergency Use Authorization (EUA). This EUA will remain in effect (meaning this test can be used) for the duration of the COVID-19 declaration under Section 564(b)(1) of the Act, 21 U.S.C. section 360bbb-3(b)(1), unless the authorization is terminated or revoked.  Performed at  [facility], [address], [place], [place] [address]   Urine Culture     Status: Abnormal   Collection Time: 04/29/21  7:42 AM   Specimen: Urine, Clean Catch  Result Value Ref Range Status   Specimen Description   Final    URINE, CLEAN CATCH Performed at [facility], [address], [place], [place] [address]    Special Requests   Final    NONE Performed at [facility], [address] [place], [place] [address]    Culture MULTIPLE SPECIES PRESENT, SUGGEST RECOLLECTION (A)  Final   Report Status 04/30/2021 FINAL  Final    Labs: CBC: Recent Labs  Lab 04/27/21 1342 04/28/21 1141 04/29/21 0440 04/30/21 0422  WBC 6.1 5.7 5.1 3.7*  NEUTROABS  --  4.1  --   --   HGB 12.3* 13.4 11.3* 11.5*  HCT 36.7* 41.3 34.7* 35.6*  MCV 93.6 99.3 97.7 99.2  PLT 177 175 125* 563*   Basic Metabolic Panel: Recent Labs  Lab 04/27/21 1344 04/28/21 1141 04/29/21 0440 04/30/21 0422 05/01/21 0156  NA 135 136 139 141 139  K 4.9 4.3 4.1 4.1 3.7  CL 101 101 108 111 108  CO2 26 23 24 24 23   GLUCOSE 228* 137* 102* 102* 118*  BUN 17 17 16 15 14   CREATININE 1.58* 1.45* 1.29* 1.30* 1.34*  CALCIUM 9.2 9.7 9.1 8.9 8.5*   Liver Function Tests: Recent Labs  Lab 04/28/21 1141 04/29/21 0440 04/30/21 0422 05/01/21 0156  AST 36 29 37 54*  ALT 25 21 22 28   ALKPHOS 142* 108 106 104  BILITOT 3.1* 2.7* 2.4* 1.9*  PROT 7.3 5.9* 5.6* 5.2*  ALBUMIN 3.2* 2.6* 2.5* 2.3*   CBG: Recent Labs  Lab 04/30/21 1622 04/30/21 1957 04/30/21 2123 05/01/21 0422 05/01/21 0742  GLUCAP 177* 167* 178* 124* 88    Discharge time spent: {LESS THAN/GREATER THAN:26388} 30 minutes.  Signed: Antonieta Pert, MD [facility] 05/01/2021

## 2021-05-01 NOTE — Progress Notes (Signed)
Nsg Discharge Note  Admit Date:  04/28/2021 Discharge date: 05/01/2021   Tyler Farmer to be D/C'd Home per MD order.  AVS completed.  Patient/caregiver able to verbalize understanding.  Discharge Medication: Allergies as of 05/01/2021       Reactions   Penicillins Nausea And Vomiting   Has patient had a PCN reaction causing immediate rash, facial/tongue/throat swelling, SOB or lightheadedness with hypotension: Yes Has patient had a PCN reaction causing severe rash involving mucus membranes or skin necrosis: No Has patient had a PCN reaction that required hospitalization: No Has patient had a PCN reaction occurring within the last 10 years: No If all of the above answers are "NO", then may proceed with Cephalosporin use. Patient states that it also caused pain in his sides.        Medication List     STOP taking these medications    furosemide 20 MG tablet Commonly known as: LASIX       TAKE these medications    acetaminophen 500 MG tablet Commonly known as: TYLENOL Take 500 mg by mouth every 6 (six) hours as needed for mild pain.   aspirin EC 81 MG tablet Take 1 tablet (81 mg total) by mouth daily with breakfast. Swallow whole.   Cranberry 500 MG Tabs Take 500 mg by mouth daily.   ferrous sulfate 325 (65 FE) MG tablet Take 325 mg by mouth daily with breakfast.   glipiZIDE 10 MG tablet Commonly known as: GLUCOTROL Take 10 mg by mouth 2 (two) times daily before a meal.   lactulose 10 GM/15ML solution Commonly known as: CHRONULAC TAKE 45 ML BY MOUTH TWICE DAILY What changed: See the new instructions.   levofloxacin 750 MG tablet Commonly known as: Levaquin Take 1 tablet (750 mg total) by mouth daily for 1 day.   levothyroxine 137 MCG tablet Commonly known as: SYNTHROID Take 137 mcg by mouth daily before breakfast.   magnesium oxide 400 MG tablet Commonly known as: MAG-OX Take 400 mg by mouth daily.   metoprolol tartrate 25 MG tablet Commonly known  as: LOPRESSOR Take 1.5 tablets (37.5 mg total) by mouth every morning. & 25 mg in the evening   ondansetron 4 MG tablet Commonly known as: ZOFRAN Take 1 tablet (4 mg total) by mouth every 8 (eight) hours as needed for nausea or vomiting.   pantoprazole 40 MG tablet Commonly known as: PROTONIX Take 1 tablet (40 mg total) by mouth 2 (two) times daily.   rifaximin 550 MG Tabs tablet Commonly known as: XIFAXAN Take 550 mg by mouth 2 (two) times daily.   simvastatin 40 MG tablet Commonly known as: ZOCOR Take 40 mg by mouth at bedtime.   spironolactone 50 MG tablet Commonly known as: ALDACTONE Take 50 mg by mouth daily.        Discharge Assessment: Vitals:   04/30/21 0900 05/01/21 0420  BP: (!) 109/57 104/65  Pulse: 84 92  Resp: 19 18  Temp: 97.7 F (36.5 C) 97.8 F (36.6 C)  SpO2: 94% 97%   Skin clean, dry and intact without evidence of skin break down, no evidence of skin tears noted. IV catheter discontinued intact. Site without signs and symptoms of complications - no redness or edema noted at insertion site, patient denies c/o pain - only slight tenderness at site.  Dressing with slight pressure applied.  D/c Instructions-Education: Discharge instructions given to patient/family with verbalized understanding. D/c education completed with patient/family including follow up instructions, medication list, d/c activities  limitations if indicated, with other d/c instructions as indicated by MD - patient able to verbalize understanding, all questions fully answered. Patient instructed to return to ED, call 911, or call MD for any changes in condition.  Patient escorted via [place], and D/C home via private auto.  [name], RN 05/01/2021 10:02 AM

## 2021-05-01 NOTE — Assessment & Plan Note (Signed)
A-fib with heart rate at times in RVR.  Now relatively controlled on home metoprolol.  Not on anticoagulation due to GI bleeding history

## 2021-05-01 NOTE — TOC Transition Note (Signed)
Transition of Care [facility]) - CM/SW Discharge Note   Patient Details  Name: Tyler Farmer MRN: [id number] Date of Birth: 1941-03-07  [facility] CM/SW Contact:  Kerin Salen, RN Phone Number: 05/01/2021, 10:32 AM   Clinical Narrative: Patient to discharge today with family, patient and family refuses [facility] services. TOC signing off.     Final next level of care: Home/Self Care Barriers to Discharge: Barriers Resolved   Patient Goals and CMS Choice Patient states their goals for this hospitalization and ongoing recovery are:: go home      Discharge Placement                    Patient and family notified of of transfer: 05/01/21  Discharge Plan and Services In-house Referral: Clinical Social Work                        HH Arranged: Patient Refused [place] Agency: NA        Social Determinants of Health (SDOH) Interventions     Readmission Risk Interventions Readmission Risk Prevention Plan 04/29/2021 04/13/2021  Transportation Screening Complete Complete  HRI or [facility] - Complete  Social Work Consult for [place] Planning/Counseling - Complete  Palliative Care Screening - Not Applicable  Medication Review [employer] photographer) Complete Complete  HRI or Home Care Consult Patient refused -  SW Recovery Care/Counseling Consult Complete -  Palliative Care Screening Not Applicable -  [place] Not Applicable -  Some recent data might be hidden

## 2021-05-02 NOTE — Discharge Summary (Signed)
Physician Discharge Summary   Patient: Tyler Farmer MRN: [id number] DOB: 01-18-1941  Admit date:     04/28/2021  Discharge date: 05/01/2021  Discharge Physician: Antonieta Pert   PCP: [name], MD   Recommendations at discharge:   Follow-up with PCP in 1 week  Discharge Diagnoses: Principal Problem:   Encephalopathy, hepatic Active Problems:   Liver cirrhosis secondary to NASH [facility])   Atrial fibrillation, chronic (HCC)   UTI (urinary tract infection)   Pneumonia   Hyperlipidemia   HTN (hypertension)   COPD (chronic obstructive pulmonary disease) (HCC)   UGI bleed   S/P TIPS (transjugular intrahepatic portosystemic shunt)   Prostate cancer (HCC)   Hypothyroidism   DM (diabetes mellitus), type 2 (HCC)   Portal hypertension (HCC)   Stage 3a chronic kidney disease (CKD) (HCC)   Lactic acidosis   Goals of care, counseling/discussion  Resolved Problems:   * No resolved hospital problems. [facility] Course: [name] year old male with history of liver cirrhosis 2/2 NASH with portal hypertension, hepatic encephalopathy, portal vein thrombosis S/P IR thrombectomy 03/05/21, diabetes, COPD, hypertension,Presented to the ED with complaint of diarrhea and altered mental status. In the ED, lab orders stable with elevated ammonia 2 6, lactic acidosis 3.4, Vitals relatively stable with mild tachycardia afebrile.  Chest x-ray with interval improvement in previously demonstrated a small left pleural effusion, persistent probable mild edema superimposed on emphysema.  CT head no acute finding IV fluids ordered, lactulose given and admitted for further management.  Patient started on empiric antibiotics for possible pneumonia/UTI. Urine culture with multiple species.Mental status significantly improved.  He worked with PT OT suggested home health.  This time is medically stable.  Assessment and Plan: * Encephalopathy, hepatic- (present on admission) CT head no acute finding.  Ammonia  elevated on admission.  Overall much improved.  Continue on his lactulose rifaximin and will discharge home with home health PT OT  Atrial fibrillation, chronic (HCC) A-fib with heart rate at times in RVR.  Now relatively controlled on home metoprolol.  Not on anticoagulation due to GI bleeding history  Liver cirrhosis secondary to NASH [facility])- (present on admission) Known liver cirrhosis on lactulose, Lasix Aldactone. History of portal vein thrombosis w/ recent thrombectomy and history esophageal varices s/p banding for primary prophylaxis,followed by GI.  MELD score on admission 19.  GI following on board.INR elevated this morning 1.8 coagulopathy due to cirrhosis.  Ultrasound abdomen and liver Doppler no acute significant finding.  Continue on current supportive care with lactulose rifaximin.   Pneumonia Possible bacterial pneumonia versus bronchitis.  Suspect likely bronchitis.  Respiratory status at this time is stable.  Continue antibiotics x5 days.   UTI (urinary tract infection) Daughter endorses patient gets frequent UTI.  Continue empiric antibiotics, urine culture multiple species.  Discharge  to complete 5 days course   Goals of care, counseling/discussion Code Status discussed with daughter at the bedside and is full code  Lactic acidosis Lactic acid decreasing -this is due to hepatic abnormality and low clearance Recent Labs  Lab 04/28/21 1141 04/28/21 1347 04/29/21 0440 04/30/21 0422  LATICACIDVEN 3.5* 3.0* 2.9* 2.4*  PROCALCITON <0.10  --  <0.10 <0.10      Stage 3a chronic kidney disease (CKD) (HCC) Creatinine somewhat elevated on admission up to 1.5 but now is stable at 1.2-1.3.  Encourage oral intake.  Off IV fluids. Recent Labs  Lab 04/27/21 1344 04/28/21 1141 04/29/21 0440 04/30/21 0422 05/01/21 0156  BUN 17 17 16  15  14  CREATININE 1.58* 1.45* 1.29* 1.30* 1.34*    Portal hypertension (HCC)- (present on admission) See cirrhosis  DM (diabetes mellitus),  type 2 ([name]) Blood sugar stable.  Holding oral glipizide-resume upon discharge.Last A1c 6.8 back in November Recent Labs  Lab 04/30/21 1119 04/30/21 1622 04/30/21 1957 04/30/21 2123 05/01/21 0422  GLUCAP 161* 177* 167* 178* 124*     Hypothyroidism- (present on admission) TSH stable, continue synthroid   Prostate cancer ([name])- (present on admission) Prostate cancer history,he will follow-up with his urologist  S/P TIPS (transjugular intrahepatic portosystemic shunt) Monitor LFTs.  UGI bleed- (present on admission) History of upper GI bleed history of gastric ulcer and history of esophageal banding.  Stable. Recent Labs  Lab 04/27/21 1342 04/28/21 1141 04/29/21 0440 04/30/21 0422  HGB 12.3* 13.4 11.3* 11.5*  HCT 36.7* 41.3 34.7* 35.6*    COPD (chronic obstructive pulmonary disease) (HCC)- (present on admission) Emphysema on chest x-ray.  Continue empiric antibiotics inhaler incentive spirometry OOB PT OT   HTN (hypertension)- (present on admission) Soft on admission, stable. off IV fluids   Hyperlipidemia- (present on admission) Continue statin    Urine culture reviewed after discharge and negative.       Consultants: none Procedures performed: none  Disposition: Home Diet recommendation:  Regular diet  DISCHARGE MEDICATION: Allergies as of 05/01/2021       Reactions   Penicillins Nausea And Vomiting   Has patient had a PCN reaction causing immediate rash, facial/tongue/throat swelling, SOB or lightheadedness with hypotension: Yes Has patient had a PCN reaction causing severe rash involving mucus membranes or skin necrosis: No Has patient had a PCN reaction that required hospitalization: No Has patient had a PCN reaction occurring within the last 10 years: No If all of the above answers are "NO", then may proceed with Cephalosporin use. Patient states that it also caused pain in his sides.        Medication List     STOP taking these  medications    furosemide 20 MG tablet Commonly known as: LASIX       TAKE these medications    acetaminophen 500 MG tablet Commonly known as: TYLENOL Take 500 mg by mouth every 6 (six) hours as needed for mild pain.   aspirin EC 81 MG tablet Take 1 tablet (81 mg total) by mouth daily with breakfast. Swallow whole.   Cranberry 500 MG Tabs Take 500 mg by mouth daily.   ferrous sulfate 325 (65 FE) MG tablet Take 325 mg by mouth daily with breakfast.   glipiZIDE 10 MG tablet Commonly known as: GLUCOTROL Take 10 mg by mouth 2 (two) times daily before a meal.   lactulose 10 GM/15ML solution Commonly known as: CHRONULAC TAKE 45 ML BY MOUTH TWICE DAILY What changed: See the new instructions.   levofloxacin 750 MG tablet Commonly known as: Levaquin Take 1 tablet (750 mg total) by mouth daily for 1 day.   levothyroxine 137 MCG tablet Commonly known as: SYNTHROID Take 137 mcg by mouth daily before breakfast.   magnesium oxide 400 MG tablet Commonly known as: MAG-OX Take 400 mg by mouth daily.   metoprolol tartrate 25 MG tablet Commonly known as: LOPRESSOR Take 1.5 tablets (37.5 mg total) by mouth every morning. & 25 mg in the evening   ondansetron 4 MG tablet Commonly known as: ZOFRAN Take 1 tablet (4 mg total) by mouth every 8 (eight) hours as needed for nausea or vomiting.   pantoprazole 40 MG tablet Commonly  known as: PROTONIX Take 1 tablet (40 mg total) by mouth 2 (two) times daily.   rifaximin 550 MG Tabs tablet Commonly known as: XIFAXAN Take 550 mg by mouth 2 (two) times daily.   simvastatin 40 MG tablet Commonly known as: ZOCOR Take 40 mg by mouth at bedtime.   spironolactone 50 MG tablet Commonly known as: ALDACTONE Take 50 mg by mouth daily.        Follow-up Information     Sasser, Silvestre Moment, MD Follow up in 1 week(s).   Specialty: Family Medicine Contact information: [place] [id number] [phone]         Arnoldo Lenis, MD .   Specialty: Cardiology Contact information: [place] [address] [phone]                 Discharge Exam: Danley Danker Weights   04/28/21 1735  Weight: 86.6 kg    Condition at discharge: stable  The results of significant diagnostics from this hospitalization (including imaging, microbiology, ancillary and laboratory) are listed below for reference.   Imaging Studies: CT Head Wo Contrast  Result Date: 04/28/2021 CLINICAL DATA:  Dizziness.  Confusion. EXAM: CT HEAD WITHOUT CONTRAST TECHNIQUE: Contiguous axial images were obtained from the base of the skull through the vertex without intravenous contrast. RADIATION DOSE REDUCTION: This exam was performed according to the departmental dose-optimization program which includes automated exposure control, adjustment of the mA and/or kV according to patient size and/or use of iterative reconstruction technique. COMPARISON:  February 19, 2021. FINDINGS: Brain: Mild chronic ischemic white matter disease is noted. No mass effect or midline shift is noted. Ventricular size is within normal limits. There is no evidence of mass lesion, hemorrhage or acute infarction. Vascular: No hyperdense vessel or unexpected calcification. Skull: Normal. Negative for fracture or focal lesion. Sinuses/Orbits: No acute finding. Other: None. IMPRESSION: No acute intracranial abnormality seen. Electronically Signed   By: Marijo Conception M.D.   On: 04/28/2021 12:56   CT Angio Chest PE W and/or Wo Contrast  Result Date: 04/12/2021 CLINICAL DATA:  Pulmonary embolism suspected, high probability. Shortness of breath since last night. EXAM: CT ANGIOGRAPHY CHEST WITH CONTRAST TECHNIQUE: Multidetector CT imaging of the chest was performed using the standard protocol during bolus administration of intravenous contrast. Multiplanar CT image reconstructions and MIPs were obtained to evaluate the vascular anatomy. RADIATION DOSE REDUCTION: This  exam was performed according to the departmental dose-optimization program which includes automated exposure control, adjustment of the mA and/or kV according to patient size and/or use of iterative reconstruction technique. CONTRAST:  43m OMNIPAQUE IOHEXOL 350 MG/ML SOLN COMPARISON:  Radiographs same date. Abdominopelvic CT 02/12/2021. No prior chest CTs are currently available for correlation. FINDINGS: Cardiovascular: The pulmonary arteries are well opacified with contrast to the level of the subsegmental branches. There is no evidence of acute pulmonary embolism. There is atherosclerosis of the aorta, great vessels and coronary arteries. There is limited systemic arterial opacification without evidence of acute vascular abnormality. The heart size is normal. There is no pericardial effusion. Mediastinum/Nodes: There are no enlarged mediastinal, hilar or axillary lymph nodes.Mild nonspecific esophageal wall thickening without significant distension. The thyroid gland and trachea demonstrate no significant findings. Lungs/Pleura: Moderate-sized dependent pleural effusions bilaterally. Evidence of underlying at least moderate centrilobular emphysema with diffuse central airway thickening. There is scattered interstitial opacities throughout both lungs and mild dependent atelectasis, but no confluent airspace opacity or suspicious nodularity. Upper abdomen: TIPS  shunt noted in the liver. Patency of this shunt cannot be confirmed on this examination due to limited portal vein opacification. Underlying morphologic changes of cirrhosis and vascular embolization clips are noted. Musculoskeletal/Chest wall: There is no chest wall mass or suspicious osseous finding. Mild degenerative changes in the spine. Bilateral gynecomastia. Review of the MIP images confirms the above findings. IMPRESSION: 1. No evidence of acute pulmonary embolism or other acute vascular findings in the chest. 2. Moderate-sized pleural effusions  with diffuse interstitial opacities superimposed on emphysema, suspicious for congestive heart failure. Recommend chest radiographic follow-up. 3. Coronary and Aortic Atherosclerosis (ICD10-I70.0). Emphysema (ICD10-J43.9). 4. Patency of the hepatic TIPS shunt cannot be addressed by this examination. Electronically Signed   By: Richardean Sale M.D.   On: 04/12/2021 16:16   US Abdomen Complete  Result Date: 04/28/2021 CLINICAL DATA:  Cirrhosis of the liver. EXAM: ABDOMEN ULTRASOUND COMPLETE COMPARISON:  01/27/2021 FINDINGS: Gallbladder: Poorly visualized.  No gross abnormalities seen. Common bile duct: Diameter: 6.8 mm Liver: Mildly echogenic with mildly nodular contours. TIPS visualized. No liver masses seen. Portal vein is patent on color Doppler imaging with normal direction of blood flow towards the liver. IVC: No abnormality visualized. Pancreas: Visualized portion unremarkable. Spleen: Size and appearance within normal limits. Right Kidney: Length: 11.1 cm. Multiple cysts. Normal echotexture. No hydronephrosis. Left Kidney: Length: 12.5 cm. Multiple cysts. Normal echotexture. No hydronephrosis. Abdominal aorta: No aneurysm visualized. Other findings: The examination was limited by bowel gas and breathing with an altered mental status. IMPRESSION: 1. Stable changes of cirrhosis of the liver with no interval masses visualized. 2. Limited examination for reasons discussed above with poor visualization of the gallbladder. Electronically Signed   By: Claudie Revering M.D.   On: 04/28/2021 15:55   DG Chest Port 1 View  Result Date: 04/29/2021 CLINICAL DATA:  Shortness of breath.  Atrial fibrillation.  Asthma. EXAM: PORTABLE CHEST 1 VIEW COMPARISON:  04/28/2021 FINDINGS: Midline trachea. Mild cardiomegaly. Atherosclerosis in the transverse aorta. Similar small left pleural effusion. No pneumothorax. Mild hyperinflation. Diffuse pulmonary interstitial prominence and indistinctness is similar. Slight worsening left and  similar right base airspace disease. IMPRESSION: Similar mild interstitial edema superimposed upon chronic bronchitis. Similar small left pleural effusion. Slightly increased left base airspace disease, most likely atelectasis. Right base atelectasis is unchanged. Aortic Atherosclerosis (ICD10-I70.0). Electronically Signed   By: Abigail Miyamoto M.D.   On: 04/29/2021 15:26   DG Chest Port 1 View  Result Date: 04/28/2021 CLINICAL DATA:  Altered mental status. Weakness. Shortness of breath. EXAM: PORTABLE CHEST 1 VIEW COMPARISON:  Radiographs 04/12/2021 and 02/20/2021.  CT 04/12/2021. FINDINGS: 1156 hours. Stable cardiomegaly and aortic atherosclerosis. The previously demonstrated small left pleural effusion has improved. Persistent diffuse increased interstitial markings, likely due to mild edema superimposed on underlying emphysema. No confluent airspace opacity or pneumothorax. The bones appear unchanged. Telemetry leads overlie the chest. IMPRESSION: Interval improvement in previously demonstrated small left pleural effusion. Persistent probable mild edema superimposed on emphysema. Electronically Signed   By: Richardean Sale M.D.   On: 04/28/2021 12:15   DG Chest Port 1 View  Result Date: 04/12/2021 CLINICAL DATA:  Shortness of breath EXAM: PORTABLE CHEST 1 VIEW COMPARISON:  02/20/2021, 01/27/2021 FINDINGS: Mild cardiomegaly. Atherosclerotic calcification of the aortic knob. Persistently increased interstitial markings throughout both lungs. Interval development of a small left pleural effusion. Streaky left basilar opacity. No pneumothorax. IMPRESSION: 1. Interval development of a small left pleural effusion with adjacent left basilar atelectasis/consolidation. 2. Persistently increased interstitial markings  throughout both lungs may reflect underlying interstitial lung disease or edema. Electronically Signed   By: Davina Poke D.O.   On: 04/12/2021 12:42   ECHOCARDIOGRAM COMPLETE  Result Date:  04/13/2021    ECHOCARDIOGRAM REPORT   Patient Name:   KALUB MORILLO Nardo Date of Exam: 04/13/2021 Medical Rec #:  [id number]         Height:       69.0 in Accession #:    [id number]        Weight:       196.4 lb Date of Birth:  04-01-1940        BSA:          2.050 m Patient Age:    50 years          BP:           101/55 mmHg Patient Gender: M                 HR:           104 bpm. Exam Location:  [facility] Procedure: 2D Echo, Cardiac Doppler and Color Doppler Indications:    Chest Pain 786.50 / R07.9  History:        Patient has prior history of Echocardiogram examinations, most                 recent 04/14/2020. COPD; Risk Factors:Hypertension, Diabetes and                 Dyslipidemia. Cirrhosis of liver, Prostate cancer ([place]) (From                 Hx), GERD.  Sonographer:    Alvino Chapel RCS Referring Phys: [place]  1. Left ventricular ejection fraction, by estimation, is 60 to 65%. The left ventricle has normal function. The left ventricle has no regional wall motion abnormalities. There is mild concentric left ventricular hypertrophy of the basal-septal segment. Diastolic function indeterminant due to AFib.  2. Right ventricular systolic function is normal. The right ventricular size is normal. There is normal pulmonary artery systolic pressure.  3. Left atrial size was severely dilated.  4. Right atrial size was mildly dilated.  5. The mitral valve is grossly normal. Mild to moderate mitral valve regurgitation. Moderate mitral annular calcification.  6. The aortic valve is tricuspid. There is mild calcification of the aortic valve. There is mild thickening of the aortic valve. Aortic valve regurgitation is not visualized. Aortic valve sclerosis/calcification is present, without any evidence of aortic stenosis.  7. The inferior vena cava is dilated in size with <50% respiratory variability, suggesting right atrial pressure of 15 mmHg. Comparison(s): Compared to prior TTE in 03/2020,  there is no significant change. FINDINGS  Left Ventricle: Left ventricular ejection fraction, by estimation, is 60 to 65%. The left ventricle has normal function. The left ventricle has no regional wall motion abnormalities. The left ventricular internal cavity size was normal in size. There is  mild concentric left ventricular hypertrophy of the basal-septal segment. Diastolic function indeterminant due to AFib. Right Ventricle: The right ventricular size is normal. No increase in right ventricular wall thickness. Right ventricular systolic function is normal. There is normal pulmonary artery systolic pressure. The tricuspid regurgitant velocity is 2.10 m/s, and  with an assumed right atrial pressure of 8 mmHg, the estimated right ventricular systolic pressure is 79.8 mmHg. Left Atrium: Left atrial size was severely dilated. Right Atrium: Right atrial size was mildly dilated.  Pericardium: There is no evidence of pericardial effusion. Mitral Valve: The mitral valve is grossly normal. There is mild thickening of the mitral valve leaflet(s). There is mild calcification of the mitral valve leaflet(s). Moderate mitral annular calcification. Mild to moderate mitral valve regurgitation. MV peak gradient, 8.0 mmHg. The mean mitral valve gradient is 2.0 mmHg. Tricuspid Valve: The tricuspid valve is normal in structure. Tricuspid valve regurgitation is mild. Aortic Valve: The aortic valve is tricuspid. There is mild calcification of the aortic valve. There is mild thickening of the aortic valve. Aortic valve regurgitation is not visualized. Aortic valve sclerosis/calcification is present, without any evidence of aortic stenosis. Aortic valve mean gradient measures 4.0 mmHg. Aortic valve peak gradient measures 6.7 mmHg. Aortic valve area, by VTI measures 2.39 cm. Pulmonic Valve: The pulmonic valve was normal in structure. Pulmonic valve regurgitation is trivial. Aorta: The aortic root is normal in size and structure. Venous:  The inferior vena cava is dilated in size with less than 50% respiratory variability, suggesting right atrial pressure of 15 mmHg. IAS/Shunts: The atrial septum is grossly normal.  LEFT VENTRICLE PLAX 2D LVIDd:         4.20 cm LVIDs:         2.70 cm LV PW:         1.10 cm LV IVS:        1.10 cm LVOT diam:     2.00 cm LV SV:         61 LV SV Index:   30 LVOT Area:     3.14 cm  RIGHT VENTRICLE TAPSE (M-mode): 1.5 cm LEFT ATRIUM              Index        RIGHT ATRIUM           Index LA diam:        4.40 cm  2.15 cm/m   RA Area:     24.80 cm LA Vol (A2C):   81.3 ml  39.66 ml/m  RA Volume:   75.60 ml  36.88 ml/m LA Vol (A4C):   168.0 ml 81.95 ml/m LA Biplane Vol: 121.0 ml 59.02 ml/m  AORTIC VALVE AV Area (Vmax):    2.26 cm AV Area (Vmean):   2.52 cm AV Area (VTI):     2.39 cm AV Vmax:           129.00 cm/s AV Vmean:          88.450 cm/s AV VTI:            0.256 m AV Peak Grad:      6.7 mmHg AV Mean Grad:      4.0 mmHg LVOT Vmax:         93.00 cm/s LVOT Vmean:        70.950 cm/s LVOT VTI:          0.194 m LVOT/AV VTI ratio: 0.76  AORTA Ao Root diam: 3.40 cm MITRAL VALVE                TRICUSPID VALVE MV Area (PHT): 4.21 cm     TR Peak grad:   17.6 mmHg MV Area VTI:   2.63 cm     TR Vmax:        210.00 cm/s MV Peak grad:  8.0 mmHg MV Mean grad:  2.0 mmHg     SHUNTS MV Vmax:       1.41 m/s     Systemic VTI:  0.19 m MV Vmean:      56.1 cm/s    Systemic Diam: 2.00 cm MV Decel Time: 180 msec MR Peak grad: 91.8 mmHg MR Mean grad: 52.0 mmHg MR Vmax:      479.00 cm/s MR Vmean:     322.0 cm/s MV E velocity: 122.00 cm/s Gwyndolyn Kaufman MD Electronically signed by Gwyndolyn Kaufman MD Signature Date/Time: 04/13/2021/3:41:33 PM    Final    US LIVER DOPPLER  Result Date: 04/29/2021 CLINICAL DATA:  Cirrhosis Portal vein thrombosis Status post portal vein thrombectomy and TIPS EXAM: DUPLEX ULTRASOUND OF LIVER TECHNIQUE: Color and duplex Doppler ultrasound was performed to evaluate the hepatic in-flow and out-flow  vessels. COMPARISON:  TIPS placement 03/05/2021 FINDINGS: Liver: Nodular hepatic contours.  No focal hepatic lesion. Portal Vein Velocities Main:  80 cm/sec Right: 117 cm/sec Left: 56 cm/sec TIPS: Portal end: 80 cm/sec Mid: 91 cm/sec Hepatic vein end: 124 cm/sec Hepatic Vein Velocities Right:  94 cm/sec Middle:  42 cm/sec Left:  53 cm/sec IVC: Present and patent with normal respiratory phasicity. Hepatic Artery Velocity:  35 cm/sec Splenic Vein Velocity: Not visualized Portal Vein Occlusion/Thrombus: No Splenic Vein Occlusion/Thrombus: No Ascites: None Varices: None IMPRESSION: 1. Patent portal vein and TIPS. 2. Mildly decreased velocity noted at the portal end of the TIPS. Attention on follow-up recommended. Electronically Signed   By: Miachel Roux M.D.   On: 04/29/2021 14:31    Microbiology: Results for orders placed or performed during the hospital encounter of 04/28/21  Resp Panel by RT-PCR (Flu A&B, Covid) Nasopharyngeal Swab     Status: None   Collection Time: 04/28/21  6:17 PM   Specimen: Nasopharyngeal Swab; Nasopharyngeal(NP) swabs in vial transport medium  Result Value Ref Range Status   SARS Coronavirus 2 by RT PCR NEGATIVE NEGATIVE Final    Comment: (NOTE) SARS-CoV-2 target nucleic acids are NOT DETECTED.  The SARS-CoV-2 RNA is generally detectable in upper respiratory specimens during the acute phase of infection. The lowest concentration of SARS-CoV-2 viral copies this assay can detect is 138 copies/mL. A negative result does not preclude SARS-Cov-2 infection and should not be used as the sole basis for treatment or other patient management decisions. A negative result may occur with  improper specimen collection/handling, submission of specimen other than nasopharyngeal swab, presence of viral mutation(s) within the areas targeted by this assay, and inadequate number of viral copies(<138 copies/mL). A negative result must be combined with clinical observations, patient history,  and epidemiological information. The expected result is Negative.  Fact Sheet for Patients:  EntrepreneurPulse.com.au  Fact Sheet for Healthcare Providers:  IncredibleEmployment.be  This test is no t yet approved or cleared by the [place] FDA and  has been authorized for detection and/or diagnosis of SARS-CoV-2 by FDA under an Emergency Use Authorization (EUA). This EUA will remain  in effect (meaning this test can be used) for the duration of the COVID-19 declaration under Section 564(b)(1) of the Act, 21 U.S.C.section 360bbb-3(b)(1), unless the authorization is terminated  or revoked sooner.       Influenza A by PCR NEGATIVE NEGATIVE Final   Influenza B by PCR NEGATIVE NEGATIVE Final    Comment: (NOTE) The Xpert Xpress SARS-CoV-2/FLU/RSV plus assay is intended as an aid in the diagnosis of influenza from Nasopharyngeal swab specimens and should not be used as a sole basis for treatment. Nasal washings and aspirates are unacceptable for Xpert Xpress SARS-CoV-2/FLU/RSV testing.  Fact Sheet for Patients: EntrepreneurPulse.com.au  Fact Sheet for Healthcare Providers:  IncredibleEmployment.be  This test is not yet approved or cleared by the [place] and has been authorized for detection and/or diagnosis of SARS-CoV-2 by FDA under an Emergency Use Authorization (EUA). This EUA will remain in effect (meaning this test can be used) for the duration of the COVID-19 declaration under Section 564(b)(1) of the Act, 21 U.S.C. section 360bbb-3(b)(1), unless the authorization is terminated or revoked.  Performed at [facility], [address], [place], [place] [address]   Urine Culture     Status: Abnormal   Collection Time: 04/29/21  7:42 AM   Specimen: Urine, Clean Catch  Result Value Ref Range Status   Specimen Description   Final    URINE, CLEAN CATCH Performed at [facility], [address] [place], [place] [address]    Special Requests   Final    NONE Performed at [facility], [address], [place], [place] [address]    Culture MULTIPLE SPECIES PRESENT, SUGGEST RECOLLECTION (A)  Final   Report Status 04/30/2021 FINAL  Final    Labs: CBC: Recent Labs  Lab 04/27/21 1342 04/28/21 1141 04/29/21 0440 04/30/21 0422  WBC 6.1 5.7 5.1 3.7*  NEUTROABS  --  4.1  --   --   HGB 12.3* 13.4 11.3* 11.5*  HCT 36.7* 41.3 34.7* 35.6*  MCV 93.6 99.3 97.7 99.2  PLT 177 175 125* 449*   Basic Metabolic Panel: Recent Labs  Lab 04/27/21 1344 04/28/21 1141 04/29/21 0440 04/30/21 0422 05/01/21 0156  NA 135 136 139 141 139  K 4.9 4.3 4.1 4.1 3.7  CL 101 101 108 111 108  CO2 26 23 24 24 23   GLUCOSE 228* 137* 102* 102* 118*  BUN 17 17 16 15 14   CREATININE 1.58* 1.45* 1.29* 1.30* 1.34*  CALCIUM 9.2 9.7 9.1 8.9 8.5*   Liver Function Tests: Recent Labs  Lab 04/28/21 1141 04/29/21 0440 04/30/21 0422 05/01/21 0156  AST 36 29 37 54*  ALT 25 21 22 28   ALKPHOS 142* 108 106 104  BILITOT 3.1* 2.7* 2.4* 1.9*  PROT 7.3 5.9* 5.6* 5.2*  ALBUMIN 3.2* 2.6* 2.5* 2.3*   CBG: Recent Labs  Lab 04/30/21 1622 04/30/21 1957 04/30/21 2123 05/01/21 0422 05/01/21 0742  GLUCAP 177* 167* 178* 124* 88    Discharge time spent:  25 minutes.  Signed: Antonieta Pert, MD [facility] 05/02/2021

## 2021-05-03 LAB — LEGIONELLA PNEUMOPHILA SEROGP 1 UR AG: L. pneumophila Serogp 1 Ur Ag: NEGATIVE

## 2021-05-04 ENCOUNTER — Other Ambulatory Visit: Payer: Self-pay

## 2021-05-04 ENCOUNTER — Ambulatory Visit
Admit: 2021-05-04 | Discharge: 2021-05-04 | Disposition: A | Discharge status: Home / Self Care | Payer: Medicare Other | Attending: Interventional Radiology | Admitting: Interventional Radiology

## 2021-05-04 ENCOUNTER — Encounter: Payer: Self-pay | Admitting: *Deleted

## 2021-05-04 DIAGNOSIS — K766 Portal hypertension: Secondary | ICD-10-CM | POA: Diagnosis not present

## 2021-05-04 DIAGNOSIS — Z9689 Presence of other specified functional implants: Secondary | ICD-10-CM | POA: Diagnosis not present

## 2021-05-04 DIAGNOSIS — I81 Portal vein thrombosis: Secondary | ICD-10-CM | POA: Diagnosis not present

## 2021-05-04 DIAGNOSIS — I8500 Esophageal varices without bleeding: Secondary | ICD-10-CM

## 2021-05-04 HISTORY — PX: IR RADIOLOGIST EVAL & MGMT: IMG5224

## 2021-05-04 NOTE — Progress Notes (Signed)
Referring Physician(s): Dr. [name]  Reason for follow up: Initial virtual telephone follow up after TIPS, portal thrombectomy, and splenorenal shunt embolization on 03/05/21  History of present illness: 81 year old male with history of Childs Pugh B, NASH cirrhosis and portal hypertension complicated by esophageal varices, splenorenal shunt, and acute, non-occlusive portal vein thrombus, and hepatic encephalopathy likely secondary to patent splenorenal shunt with increasing retrograde/stagnant portal venous flow due to enlarging acute thrombus.  On 03/05/21 he underwent TIPS creation with portal thrombectomy (aspiration, Penumbra 12L) and coil embolization of large splenorenal shunt from retrograde approach.  He was discharged home the next day after an uneventful overnight observation stay.  His wife states that his confusion prior to the procedure has significantly improved.  He still has some confusion, but this is described as a chronic process with him.  He has remained compliant with lactulose.  He denies abdominal pain, nausea, vomiting, hematemesis, or blood in stool.    He was recently hospitalized at [facility] from 04/28/21 to 05/01/21 for confusion, atrial fibrillation, and a urinary tract infection.  Lactulose was increased and he was treated with Levaquin.  He is feeling much better now.  He denies abdominal distention, upper or lower extremity swelling.  He is off lasix.    Past Medical History:  Diagnosis Date   A-fib [facility])    AKI (acute kidney injury) ([place]) 01/27/2021   Arthritis    Hands/Fingers   Asthma    Back pain    Cirrhosis of liver (HCC)    Diabetes mellitus ([place])    Enlarged prostate    Gastric ulcer    GERD (gastroesophageal reflux disease)    H/O bladder problems    History of kidney stones    Hypercholesteremia    Hypertension    Prostate cancer ([place])    Thyroid disease    UGI bleed 10/26/2016   Vitamin B12 deficiency 10/26/2016    Past  Surgical History:  Procedure Laterality Date   CIRCUMCISION     at age 54   COLONOSCOPY N/A 11/13/2015   Procedure: COLONOSCOPY;  Surgeon: Rogene Houston, MD;  [facility];  Service: Endoscopy;  Laterality: N/A;  2:10   COLONOSCOPY WITH PROPOFOL N/A 06/17/2020   Procedure: COLONOSCOPY WITH PROPOFOL;  Surgeon: Rogene Houston, MD;  [facility];  Service: Endoscopy;  Laterality: N/A;  AM / patient had positive covid 05/25/20   ESOPHAGEAL BANDING N/A 02/13/2021   Procedure: ESOPHAGEAL BANDING;  Surgeon: Harvel Quale, MD;  [facility];  Service: Gastroenterology;  Laterality: N/A;   ESOPHAGOGASTRODUODENOSCOPY N/A 06/03/2014   Procedure: ESOPHAGOGASTRODUODENOSCOPY (EGD);  Surgeon: Rogene Houston, MD;  [facility];  Service: Endoscopy;  Laterality: N/A;  730   ESOPHAGOGASTRODUODENOSCOPY N/A 10/26/2016   Procedure: ESOPHAGOGASTRODUODENOSCOPY (EGD);  Surgeon: Rogene Houston, MD;  [facility];  Service: Endoscopy;  Laterality: N/A;   ESOPHAGOGASTRODUODENOSCOPY (EGD) WITH PROPOFOL N/A 06/17/2020   Procedure: ESOPHAGOGASTRODUODENOSCOPY (EGD) WITH PROPOFOL;  Surgeon: Rogene Houston, MD;  [facility];  Service: Endoscopy;  Laterality: N/A;   ESOPHAGOGASTRODUODENOSCOPY (EGD) WITH PROPOFOL N/A 02/13/2021   Procedure: ESOPHAGOGASTRODUODENOSCOPY (EGD) WITH PROPOFOL;  Surgeon: Harvel Quale, MD;  [facility];  Service: Gastroenterology;  Laterality: N/A;   Gastric Ulcer  1993   GIVENS CAPSULE STUDY N/A 10/28/2016   Procedure: GIVENS CAPSULE STUDY;  Surgeon: Rogene Houston, MD;  [facility];  Service: Endoscopy;  Laterality:  N/A;   HERNIA REPAIR     IR EMBO VENOUS NOT HEMORR HEMANG  INC GUIDE ROADMAPPING  03/05/2021   IR INTRAVASCULAR ULTRASOUND NON CORONARY  03/05/2021   IR RADIOLOGIST EVAL & MGMT  02/26/2021   IR THROMBECT VENO MECH MOD SED  03/05/2021   IR TIPS  03/05/2021   IR US  GUIDE VASC ACCESS RIGHT  03/05/2021   IR US GUIDE VASC ACCESS RIGHT  03/05/2021   POLYPECTOMY  11/13/2015   Procedure: POLYPECTOMY;  Surgeon: Rogene Houston, MD;  [facility];  Service: Endoscopy;;  colon   POLYPECTOMY  06/17/2020   Procedure: POLYPECTOMY;  Surgeon: Rogene Houston, MD;  [facility];  Service: Endoscopy;;  sigmoid   RADIOLOGY WITH ANESTHESIA N/A 03/05/2021   Procedure: RADIOLOGY WITH ANESTHESIA    I.R. Rise Paganini;  Surgeon: Suzette Battiest, MD;  Location: [place];  Service: Radiology;  Laterality: N/A;   Right Elbow Right    A pin was put in    Allergies: Penicillins  Medications: Prior to Admission medications   Medication Sig Start Date End Date Taking? Authorizing Provider  acetaminophen (TYLENOL) 500 MG tablet Take 500 mg by mouth every 6 (six) hours as needed for mild pain.    [provider]  aspirin EC 81 MG tablet Take 1 tablet (81 mg total) by mouth daily with breakfast. Swallow whole. 02/21/21   Roxan Hockey, MD  Cranberry 500 MG TABS Take 500 mg by mouth daily.    [provider]  ferrous sulfate 325 (65 FE) MG tablet Take 325 mg by mouth daily with breakfast.    [provider]  glipiZIDE (GLUCOTROL) 10 MG tablet Take 10 mg by mouth 2 (two) times daily before a meal.    [provider]  lactulose (CHRONULAC) 10 GM/15ML solution TAKE 45 ML BY MOUTH TWICE DAILY Patient taking differently: Take 45 g by mouth 3 (three) times daily. 04/19/21   Harvel Quale, MD  levothyroxine (SYNTHROID) 137 MCG tablet Take 137 mcg by mouth daily before breakfast.    [provider]  magnesium oxide (MAG-OX) 400 MG tablet Take 400 mg by mouth daily.    [provider]  metoprolol tartrate (LOPRESSOR) 25 MG tablet Take 1.5 tablets (37.5 mg total) by mouth every morning. & 25 mg in the evening 04/27/21   Imogene Burn, PA-C  ondansetron (ZOFRAN) 4 MG tablet Take 1 tablet (4 mg total) by mouth every  8 (eight) hours as needed for nausea or vomiting. 03/10/21   Montez Morita, Quillian Quince, MD  pantoprazole (PROTONIX) 40 MG tablet Take 1 tablet (40 mg total) by mouth 2 (two) times daily. 02/14/21 04/28/21  Manuella Ghazi, Pratik D, DO  rifaximin (XIFAXAN) 550 MG TABS tablet Take 550 mg by mouth 2 (two) times daily.    [provider]  simvastatin (ZOCOR) 40 MG tablet Take 40 mg by mouth at bedtime.     [provider]  spironolactone (ALDACTONE) 50 MG tablet Take 50 mg by mouth daily.    [provider]     Family History  Problem Relation Age of Onset   Emphysema Mother    Cerebrovascular Accident Father    Heart disease Father    Diabetes Sister    Heart disease Brother    Heart disease Sister    Heart disease Brother    Diabetes Brother     Social History   Socioeconomic History   Marital status: Married  Spouse name: Not on file   Number of children: Not on file   Years of education: Not on file   Highest education level: Not on file  Occupational History   Not on file  Tobacco Use   Smoking status: Former    Types: Cigarettes    Quit date: 05/25/1989    Years since quitting: 31.9   Smokeless tobacco: Former    Quit date: 01/16/1990  Vaping Use   Vaping Use: Never used  Substance and Sexual Activity   Alcohol use: No    Alcohol/week: 0.0 standard drinks    Comment: Drank many years - 30 years ago   Drug use: No   Sexual activity: Not on file  Other Topics Concern   Not on file  Social History Narrative   Not on file   Social Determinants of Health   Financial Resource Strain: Not on file  Food Insecurity: Not on file  Transportation Needs: Not on file  Physical Activity: Not on file  Stress: Not on file  Social Connections: Not on file     Vital Signs: There were no vitals taken for this visit.  No physical examination was performed in lieu of virtual telephone clinic visit.   Imaging: TIPS (03/05/21)  Extensive portal  thrombus.   Persistent main portal thrombus after TIPS creation and large splenorenal shunt with retrograde portal flow.   After portal thrombectomy, decreased flow via shunt.   Completion portal venogram after splenorenal shunt coil embolization.   TIPS Duplex 04/28/21 Patent TIPS with mildly increased velocity (124 cm/sec) at hepatic vein aspect compared to mid TIPS (92 cm/sec).   Labs:  CBC: Recent Labs    04/27/21 1342 04/28/21 1141 04/29/21 0440 04/30/21 0422  WBC 6.1 5.7 5.1 3.7*  HGB 12.3* 13.4 11.3* 11.5*  HCT 36.7* 41.3 34.7* 35.6*  PLT 177 175 125* 125*    COAGS: Recent Labs    03/12/21 1012 04/28/21 1145 04/30/21 0422 05/01/21 0156  INR 1.2* 1.4* 1.8* 1.8*    BMP: Recent Labs    04/28/21 1141 04/29/21 0440 04/30/21 0422 05/01/21 0156  NA 136 139 141 139  K 4.3 4.1 4.1 3.7  CL 101 108 111 108  CO2 23 24 24 23   GLUCOSE 137* 102* 102* 118*  BUN 17 16 15 14   CALCIUM 9.7 9.1 8.9 8.5*  CREATININE 1.45* 1.29* 1.30* 1.34*  GFRNONAA 49* 56* 56* 54*    LIVER FUNCTION TESTS: Recent Labs    04/28/21 1141 04/29/21 0440 04/30/21 0422 05/01/21 0156  BILITOT 3.1* 2.7* 2.4* 1.9*  AST 36 29 37 54*  ALT 25 21 22 28   ALKPHOS 142* 108 106 104  PROT 7.3 5.9* 5.6* 5.2*  ALBUMIN 3.2* 2.6* 2.5* 2.3*    Assessment and Plan: 81 year old male with history of Childs Pugh B, NASH cirrhosis and portal hypertension complicated by esophageal varices, splenorenal shunt, and acute, non-occlusive portal vein thrombus, and hepatic encephalopathy likely secondary to patent splenorenal shunt with increasing retrograde/stagnant portal venous flow due to enlarging acute thrombus.  He underwent TIPS creation on 03/05/21 with portal thrombectomy and coil embolization of large splenorenal shunt from retrograde approach.   Overall recovering very well from TIPS, compliant with lactulose TID and rifaxamin 550 mg BID, off diuretics.  Recent hospitalization for confusion,  possibly hepatic encephalopathy, though confounded by atrial fibrillation and urinary tract infection.  Feeling much better after discharge home.  Findings on recent TIPS duplex are likely spurious and not clinically  significant.    Plan to follow up in 3 months with repeat TIPS duplex.  Electronically Signed: Suzette Battiest 05/04/2021, 10:19 AM   I spent a total of 25 Minutes in telephone clinical consultation, greater than 50% of which was counseling/coordinating care for portal hypertension.

## 2021-05-05 DIAGNOSIS — J40 Bronchitis, not specified as acute or chronic: Secondary | ICD-10-CM | POA: Diagnosis not present

## 2021-05-05 DIAGNOSIS — I4891 Unspecified atrial fibrillation: Secondary | ICD-10-CM | POA: Diagnosis not present

## 2021-05-05 DIAGNOSIS — K7460 Unspecified cirrhosis of liver: Secondary | ICD-10-CM | POA: Diagnosis not present

## 2021-05-05 DIAGNOSIS — R188 Other ascites: Secondary | ICD-10-CM | POA: Diagnosis not present

## 2021-05-05 DIAGNOSIS — K7682 Hepatic encephalopathy: Secondary | ICD-10-CM | POA: Diagnosis not present

## 2021-05-11 ENCOUNTER — Ambulatory Visit (INDEPENDENT_AMBULATORY_CARE_PROVIDER_SITE_OTHER): Payer: Medicare Other | Admitting: Internal Medicine

## 2021-05-11 ENCOUNTER — Encounter (INDEPENDENT_AMBULATORY_CARE_PROVIDER_SITE_OTHER): Payer: Self-pay | Admitting: Internal Medicine

## 2021-05-11 ENCOUNTER — Other Ambulatory Visit: Payer: Self-pay

## 2021-05-11 VITALS — BP 100/64 | HR 91 | Temp 98.5°F | Ht 69.0 in | Wt 191.0 lb

## 2021-05-11 DIAGNOSIS — K7581 Nonalcoholic steatohepatitis (NASH): Secondary | ICD-10-CM | POA: Diagnosis not present

## 2021-05-11 DIAGNOSIS — K7460 Unspecified cirrhosis of liver: Secondary | ICD-10-CM | POA: Diagnosis not present

## 2021-05-11 DIAGNOSIS — Z87440 Personal history of urinary (tract) infections: Secondary | ICD-10-CM

## 2021-05-11 DIAGNOSIS — D649 Anemia, unspecified: Secondary | ICD-10-CM | POA: Diagnosis not present

## 2021-05-11 DIAGNOSIS — K7682 Hepatic encephalopathy: Secondary | ICD-10-CM

## 2021-05-11 MED ORDER — FUROSEMIDE 20 MG PO TABS: 20.0000 mg | ORAL_TABLET | Freq: Every day | ORAL | 2 refills | Status: DC

## 2021-05-11 NOTE — Patient Instructions (Signed)
Daily weight check. Stop fluid pills if weight down by 5 pounds. Physician will call with results of blood and urine test. Follow-up with Dr. Quintin Alto regarding breathing issues

## 2021-05-11 NOTE — Progress Notes (Signed)
Presenting complaint;  Follow-up for chronic liver disease.  Database and subjective:  Patient is 81 year old Caucasian male who has history of cirrhosis secondary to NASH complicated by hepatic encephalopathy, history of ascites as well as portal vein thrombosis for which she underwent TIPS placement and computer-assisted thrombectomy in December 2022.  He also has a history of GI bleed secondary to gastric ulcer in November 2022 when he also had esophageal variceal banding.  Follow-up EGD was planned but could not be accomplished because of other ongoing issues. Patient was seen in the office by me on 04/22/2021 and he appeared to be doing well. He was hospitalized on 04/28/2021 for hepatic encephalopathy while on lactulose and Xifaxan.  Hepatic encephalopathy was felt to be due to urinary tract infection.  He was treated with Levaquin.  Last dose was 9 days ago. He also followed with Dr. Ruthann Cancer of IR and he felt TIPS was working well.  He plans to repeat TIPS duplex in 3 months.  His complaint today is 1 of exertional dyspnea.  Dyspnea is not associated with chest pain.  He is not having any dyspnea at rest.  He gets short of breath on walking less than a block.  He says he used to be on oxygen for 4 years and somehow it was not improved.  He does not feel dyspnea is bad enough for him to go back to emergency room. He has not been confused since he was discharged from the hospital 10 days ago.  He is having at least 2-3 bowel movements per day.  He denies abdominal pain melena or rectal bleeding nausea or vomiting. His wife says she gave him furosemide 40 mg last night and day before.  She states when he was discharged in the hospital furosemide was not on the list of medications.  He is supposed to be on furosemide 20 mg daily.  She states she also has not been giving him lisinopril.  His blood pressure has been running low.  Current Medications: Outpatient Encounter Medications as of 05/11/2021   Medication Sig   acetaminophen (TYLENOL) 500 MG tablet Take 500 mg by mouth every 6 (six) hours as needed for mild pain.   aspirin EC 81 MG tablet Take 1 tablet (81 mg total) by mouth daily with breakfast. Swallow whole.   Cranberry 500 MG TABS Take 500 mg by mouth daily.   ferrous sulfate 325 (65 FE) MG tablet Take 325 mg by mouth daily with breakfast.   furosemide (LASIX) 20 MG tablet Take 1 tablet (20 mg total) by mouth daily.   glipiZIDE (GLUCOTROL) 10 MG tablet Take 10 mg by mouth 2 (two) times daily before a meal.   lactulose (CHRONULAC) 10 GM/15ML solution TAKE 45 ML BY MOUTH TWICE DAILY (Patient taking differently: Take 45 g by mouth 3 (three) times daily.)   levothyroxine (SYNTHROID) 137 MCG tablet Take 137 mcg by mouth daily before breakfast.   magnesium oxide (MAG-OX) 400 MG tablet Take 400 mg by mouth daily.   metoprolol tartrate (LOPRESSOR) 25 MG tablet Take 1.5 tablets (37.5 mg total) by mouth every morning. & 25 mg in the evening   ondansetron (ZOFRAN) 4 MG tablet Take 1 tablet (4 mg total) by mouth every 8 (eight) hours as needed for nausea or vomiting.   pantoprazole (PROTONIX) 40 MG tablet Take 1 tablet (40 mg total) by mouth 2 (two) times daily.   rifaximin (XIFAXAN) 550 MG TABS tablet Take 550 mg by mouth 2 (two) times  daily.   simvastatin (ZOCOR) 40 MG tablet Take 40 mg by mouth at bedtime.    spironolactone (ALDACTONE) 50 MG tablet Take 50 mg by mouth daily.   No facility-administered encounter medications on file as of 05/11/2021.     Objective: Blood pressure 100/64, pulse 91, temperature 98.5 F (36.9 C), temperature source Oral, height _0  (1.753 m), weight 191 lb (86.6 kg), SpO2 97 %. Patient is alert and in no acute distress. He appears to have asterixis. Conjunctiva is pink. Sclera is nonicteric Oropharyngeal mucosa is normal. No neck masses or thyromegaly noted. Cardiac exam with irregular rhythm normal S1 and S2. No murmur or gallop noted. Lungs are  clear to auscultation. Abdomen is symmetrical with upper midline calcified scar.  Abdomen soft nontender with organomegaly or masses. No LE edema or clubbing noted.  Labs/studies Results:   CBC Latest Ref Rng & Units 04/30/2021 04/29/2021 04/28/2021  WBC 4.0 - 10.5 K/uL 3.7(L) 5.1 5.7  Hemoglobin 13.0 - 17.0 g/dL 11.5(L) 11.3(L) 13.4  Hematocrit 39.0 - 52.0 % 35.6(L) 34.7(L) 41.3  Platelets 150 - 400 K/uL 125(L) 125(L) 175    CMP Latest Ref Rng & Units 05/01/2021 04/30/2021 04/29/2021  Glucose 70 - 99 mg/dL 118(H) 102(H) 102(H)  BUN 8 - 23 mg/dL _1 Creatinine 0.61 - 1.24 mg/dL 1.34(H) 1.30(H) 1.29(H)  Sodium 135 - 145 mmol/L 139 141 139  Potassium 3.5 - 5.1 mmol/L 3.7 4.1 4.1  Chloride 98 - 111 mmol/L 108 111 108  CO2 22 - 32 mmol/L _2 Calcium 8.9 - 10.3 mg/dL 8.5(L) 8.9 9.1  Total Protein 6.5 - 8.1 g/dL 5.2(L) 5.6(L) 5.9(L)  Total Bilirubin 0.3 - 1.2 mg/dL 1.9(H) 2.4(H) 2.7(H)  Alkaline Phos 38 - 126 U/L 104 106 108  AST 15 - 41 U/L 54(H) 37 29  ALT 0 - 44 U/L _3 Hepatic Function Latest Ref Rng & Units 05/01/2021 04/30/2021 04/29/2021  Total Protein 6.5 - 8.1 g/dL 5.2(L) 5.6(L) 5.9(L)  Albumin 3.5 - 5.0 g/dL 2.3(L) 2.5(L) 2.6(L)  AST 15 - 41 U/L 54(H) 37 29  ALT 0 - 44 U/L _4 Alk Phosphatase 38 - 126 U/L 104 106 108  Total Bilirubin 0.3 - 1.2 mg/dL 1.9(H) 2.4(H) 2.7(H)  Bilirubin, Direct 0.0 - 0.2 mg/dL 0.3(H) 0.3(H) -      Assessment:  #1.  Hepatic encephalopathy.  He still has mild asterixis but it is hard for him to extend his wrist because of prior injury to his wrist.  He will continue lactulose and Xifaxan as before.  #2.  History of gastric ulcer.  He was diagnosed in November 2022.  We will continue pantoprazole 40 mg twice daily until next EGD to assess varices and documented gastric ulcer healing.  #3.  Anemia.  Anemia is multifactorial.  #4.  Cirrhosis secondary to NASH.  He is not a candidate for transplant evaluation given his age.  We  will recheck labs and determine MELD score.  #5.  Exertional dyspnea.  He does not appear to be in CHF.  I suspect dyspnea secondary to chronic lung disease.  Patient advised to follow-up with Dr. Quintin Alto as soon as possible.  If dyspnea worsens he will report to emergency room.  #6.  History of urinary tract infection.  We will do a follow-up urine end of this week which would be about 2 weeks since he stopped his antibiotic.   Plan:  Patient will go  to the lab on 05/14/2021 for urinalysis to be followed by culture if he has leukocytes, CBC, metabolic 7, total bilirubin, INR and serum ammonia level. Continue furosemide at 20 mg p.o. daily and spironolactone 50 mg daily. Notify if weight changes by more than 5 pounds either way. Follow-up with Dr. Quintin Alto regarding exertional dyspnea to determine if he needs to be back on nasal O2. Office visit in 4 weeks.

## 2021-05-14 ENCOUNTER — Encounter (INDEPENDENT_AMBULATORY_CARE_PROVIDER_SITE_OTHER): Payer: Self-pay | Admitting: Internal Medicine

## 2021-05-14 DIAGNOSIS — D649 Anemia, unspecified: Secondary | ICD-10-CM | POA: Diagnosis not present

## 2021-05-14 DIAGNOSIS — K7460 Unspecified cirrhosis of liver: Secondary | ICD-10-CM | POA: Diagnosis not present

## 2021-05-14 DIAGNOSIS — N390 Urinary tract infection, site not specified: Secondary | ICD-10-CM | POA: Diagnosis not present

## 2021-05-14 DIAGNOSIS — K7581 Nonalcoholic steatohepatitis (NASH): Secondary | ICD-10-CM | POA: Diagnosis not present

## 2021-05-14 DIAGNOSIS — I4891 Unspecified atrial fibrillation: Secondary | ICD-10-CM | POA: Diagnosis not present

## 2021-05-14 DIAGNOSIS — I509 Heart failure, unspecified: Secondary | ICD-10-CM | POA: Diagnosis not present

## 2021-05-14 DIAGNOSIS — K7682 Hepatic encephalopathy: Secondary | ICD-10-CM | POA: Diagnosis not present

## 2021-05-14 DIAGNOSIS — E1129 Type 2 diabetes mellitus with other diabetic kidney complication: Secondary | ICD-10-CM | POA: Diagnosis not present

## 2021-05-14 DIAGNOSIS — N183 Chronic kidney disease, stage 3 unspecified: Secondary | ICD-10-CM | POA: Diagnosis not present

## 2021-05-16 LAB — URINALYSIS W MICROSCOPIC + REFLEX CULTURE
Bilirubin Urine: NEGATIVE
Glucose, UA: NEGATIVE
Hyaline Cast: NONE SEEN /LPF
Ketones, ur: NEGATIVE
Nitrites, Initial: POSITIVE — AB
Protein, ur: NEGATIVE
Specific Gravity, Urine: 1.016 (ref 1.001–1.035)
Squamous Epithelial / HPF: NONE SEEN /HPF (ref <=5)
pH: 6.5 (ref 5.0–8.0)

## 2021-05-16 LAB — URINE CULTURE
MICRO NUMBER:: 13054429
SPECIMEN QUALITY:: ADEQUATE

## 2021-05-16 LAB — CBC
HCT: 38.5 % (ref 38.5–50.0)
Hemoglobin: 12.8 g/dL — ABNORMAL LOW (ref 13.2–17.1)
MCH: 31.4 pg (ref 27.0–33.0)
MCHC: 33.2 g/dL (ref 32.0–36.0)
MCV: 94.6 fL (ref 80.0–100.0)
MPV: 10.5 fL (ref 7.5–12.5)
Platelets: 128 10*3/uL — ABNORMAL LOW (ref 140–400)
RBC: 4.07 10*6/uL — ABNORMAL LOW (ref 4.20–5.80)
RDW: 15.9 % — ABNORMAL HIGH (ref 11.0–15.0)
WBC: 5.7 10*3/uL (ref 3.8–10.8)

## 2021-05-16 LAB — AMMONIA: Ammonia: 95 umol/L — ABNORMAL HIGH (ref <=72)

## 2021-05-16 LAB — BASIC METABOLIC PANEL
BUN/Creatinine Ratio: 11 (calc) (ref 6–22)
BUN: 16 mg/dL (ref 7–25)
CO2: 27 mmol/L (ref 20–32)
Calcium: 8.7 mg/dL (ref 8.6–10.3)
Chloride: 106 mmol/L (ref 98–110)
Creat: 1.49 mg/dL — ABNORMAL HIGH (ref 0.70–1.22)
Glucose, Bld: 128 mg/dL (ref 65–139)
Potassium: 4.6 mmol/L (ref 3.5–5.3)
Sodium: 139 mmol/L (ref 135–146)

## 2021-05-16 LAB — PROTIME-INR
INR: 1.2 — ABNORMAL HIGH
Prothrombin Time: 12.4 s — ABNORMAL HIGH (ref 9.0–11.5)

## 2021-05-16 LAB — CULTURE INDICATED

## 2021-05-16 LAB — BILIRUBIN, TOTAL: Total Bilirubin: 1.6 mg/dL — ABNORMAL HIGH (ref 0.2–1.2)

## 2021-05-17 ENCOUNTER — Other Ambulatory Visit (INDEPENDENT_AMBULATORY_CARE_PROVIDER_SITE_OTHER): Payer: Self-pay | Admitting: *Deleted

## 2021-05-17 DIAGNOSIS — K7682 Hepatic encephalopathy: Secondary | ICD-10-CM

## 2021-05-17 NOTE — Telephone Encounter (Signed)
Seen 05/11/21. Needs refill on lactulose. Taking 45 mls bid   [facility] [place].

## 2021-05-18 DIAGNOSIS — I509 Heart failure, unspecified: Secondary | ICD-10-CM | POA: Diagnosis not present

## 2021-05-18 MED ORDER — LACTULOSE 10 GM/15ML PO SOLN: ORAL | 5 refills | Status: DC

## 2021-05-19 ENCOUNTER — Other Ambulatory Visit (INDEPENDENT_AMBULATORY_CARE_PROVIDER_SITE_OTHER): Payer: Self-pay

## 2021-05-19 DIAGNOSIS — A498 Other bacterial infections of unspecified site: Secondary | ICD-10-CM

## 2021-05-19 MED ORDER — PANTOPRAZOLE SODIUM 40 MG PO TBEC: 40.0000 mg | DELAYED_RELEASE_TABLET | Freq: Two times a day (BID) | ORAL | 2 refills | Status: DC

## 2021-05-19 MED ORDER — NITROFURANTOIN MACROCRYSTAL 100 MG PO CAPS: 100.0000 mg | ORAL_CAPSULE | Freq: Two times a day (BID) | ORAL | 0 refills | Status: AC

## 2021-05-31 DIAGNOSIS — I4891 Unspecified atrial fibrillation: Secondary | ICD-10-CM | POA: Diagnosis not present

## 2021-05-31 DIAGNOSIS — K7460 Unspecified cirrhosis of liver: Secondary | ICD-10-CM | POA: Diagnosis not present

## 2021-05-31 DIAGNOSIS — R188 Other ascites: Secondary | ICD-10-CM | POA: Diagnosis not present

## 2021-05-31 DIAGNOSIS — K7682 Hepatic encephalopathy: Secondary | ICD-10-CM | POA: Diagnosis not present

## 2021-05-31 DIAGNOSIS — J40 Bronchitis, not specified as acute or chronic: Secondary | ICD-10-CM | POA: Diagnosis not present

## 2021-06-08 ENCOUNTER — Other Ambulatory Visit: Payer: Self-pay

## 2021-06-08 ENCOUNTER — Other Ambulatory Visit (INDEPENDENT_AMBULATORY_CARE_PROVIDER_SITE_OTHER): Payer: Self-pay

## 2021-06-08 ENCOUNTER — Ambulatory Visit (INDEPENDENT_AMBULATORY_CARE_PROVIDER_SITE_OTHER): Payer: Medicare Other | Admitting: Internal Medicine

## 2021-06-08 ENCOUNTER — Encounter (INDEPENDENT_AMBULATORY_CARE_PROVIDER_SITE_OTHER): Payer: Self-pay

## 2021-06-08 ENCOUNTER — Encounter (INDEPENDENT_AMBULATORY_CARE_PROVIDER_SITE_OTHER): Payer: Self-pay | Admitting: Internal Medicine

## 2021-06-08 VITALS — BP 97/60 | HR 89 | Ht 69.0 in | Wt 178.8 lb

## 2021-06-08 DIAGNOSIS — K7581 Nonalcoholic steatohepatitis (NASH): Secondary | ICD-10-CM

## 2021-06-08 DIAGNOSIS — K7460 Unspecified cirrhosis of liver: Secondary | ICD-10-CM

## 2021-06-08 DIAGNOSIS — K257 Chronic gastric ulcer without hemorrhage or perforation: Secondary | ICD-10-CM

## 2021-06-08 DIAGNOSIS — K7682 Hepatic encephalopathy: Secondary | ICD-10-CM

## 2021-06-08 DIAGNOSIS — D508 Other iron deficiency anemias: Secondary | ICD-10-CM | POA: Diagnosis not present

## 2021-06-08 DIAGNOSIS — K259 Gastric ulcer, unspecified as acute or chronic, without hemorrhage or perforation: Secondary | ICD-10-CM

## 2021-06-08 DIAGNOSIS — D649 Anemia, unspecified: Secondary | ICD-10-CM

## 2021-06-08 MED ORDER — LACTULOSE 10 GM/15ML PO SOLN: 30.0000 g | Freq: Three times a day (TID) | ORAL | 5 refills | Status: DC

## 2021-06-08 NOTE — Progress Notes (Signed)
Presenting complaint; ? ?Follow-up for decompensated cirrhosis, history of gastric ulcer and hepatic encephalopathy. ? ?Database and subjective: ? ?Patient is 81 year old Caucasian male who has cirrhosis secondary to NASH with multiple complications who is here for scheduled visit.  He was last seen on 05/11/2021 following hospitalization for hepatic encephalopathy(04/28/2021) secondary to urinary tract infection.  He had follow-up urinalysis by me in he had Staph epidermidis in his urine.  He was treated with Macrodantin.  Follow-up with Dr. Jeffie Pollock was recommended but he has not been able to see him until now.  He has a office visit in 2 days. ? ?Patient states he is doing well.  His wife says every now and then he is confused.  She has been giving him lactulose 3 times a day.  He has 2-3 bowel movements per day.  He denies melena or rectal bleeding or abdominal pain.  His appetite is fair.  He has lost 13 pounds since his last visit.  His wife states he is still driving some locally.  She is also worried about skin discoloration to his feet.  She says she is soaking his feet but has not made a difference.  Patient denies foot pain or intermittent claudication.  He also denies numbness. ?He says his urine has been concentrated but he has not had dysuria or hematuria. ? ? ?Current Medications: ?Outpatient Encounter Medications as of 06/08/2021  ?Medication Sig  ? acetaminophen (TYLENOL) 500 MG tablet Take 500 mg by mouth every 6 (six) hours as needed for mild pain.  ? aspirin EC 81 MG tablet Take 1 tablet (81 mg total) by mouth daily with breakfast. Swallow whole.  ? Cranberry 500 MG TABS Take 500 mg by mouth daily.  ? ferrous sulfate 325 (65 FE) MG tablet Take 325 mg by mouth daily with breakfast.  ? furosemide (LASIX) 20 MG tablet Take 1 tablet (20 mg total) by mouth daily.  ? glipiZIDE (GLUCOTROL) 10 MG tablet Take 10 mg by mouth 2 (two) times daily before a meal.  ? guaiFENesin (MUCINEX) 600 MG 12 hr tablet Take by  mouth 2 (two) times daily as needed.  ? lactulose (CHRONULAC) 10 GM/15ML solution TAKE 45 ML BY MOUTH TWICE DAILY  ? levothyroxine (SYNTHROID) 137 MCG tablet Take 137 mcg by mouth daily before breakfast.  ? magnesium oxide (MAG-OX) 400 MG tablet Take 400 mg by mouth daily.  ? metoprolol tartrate (LOPRESSOR) 25 MG tablet Take 1.5 tablets (37.5 mg total) by mouth every morning. & 25 mg in the evening  ? ondansetron (ZOFRAN) 4 MG tablet Take 1 tablet (4 mg total) by mouth every 8 (eight) hours as needed for nausea or vomiting.  ? pantoprazole (PROTONIX) 40 MG tablet Take 1 tablet (40 mg total) by mouth 2 (two) times daily.  ? rifaximin (XIFAXAN) 550 MG TABS tablet Take 550 mg by mouth 2 (two) times daily.  ? simvastatin (ZOCOR) 40 MG tablet Take 40 mg by mouth at bedtime.   ? spironolactone (ALDACTONE) 50 MG tablet Take 50 mg by mouth daily.  ? ?No facility-administered encounter medications on file as of 06/08/2021.  ? ? ? ?Objective: ?Blood pressure 97/60, pulse 89, height 5' 9"  (1.753 m), weight 178 lb 12.8 oz (81.1 kg). ?Patient is alert and he does not have asterixis although he cannot fully extend his wrist due to arthritis. ?Conjunctiva is pink. Sclera is nonicteric ?Oropharyngeal mucosa is normal. ?No neck masses or thyromegaly noted. ?Cardiac exam with irregular rhythm normal S1 and S2. No  murmur or gallop noted. ?Lungs are clear to auscultation. ?Abdomen is symmetrical.  He has upper midline scar which is induration towards upper end due to calcification.  Abdomen is soft.  Mild tenderness noted in epigastric region.  Spleen is not palpable.  Liver edge is indistinct.  Flanks are nontender. ?No LE edema or clubbing noted. ?Examination of his feet reveal purplish pink discoloration of skin but there is no ulceration. ? ?Labs/studies Results: ? ? ?CBC Latest Ref Rng & Units 05/14/2021 04/30/2021 04/29/2021  ?WBC 3.8 - 10.8 Thousand/uL 5.7 3.7(L) 5.1  ?Hemoglobin 13.2 - 17.1 g/dL 12.8(L) 11.5(L) 11.3(L)  ?Hematocrit  38.5 - 50.0 % 38.5 35.6(L) 34.7(L)  ?Platelets 140 - 400 Thousand/uL 128(L) 125(L) 125(L)  ?  ?CMP Latest Ref Rng & Units 05/14/2021 05/01/2021 04/30/2021  ?Glucose 65 - 139 mg/dL 128 118(H) 102(H)  ?BUN 7 - 25 mg/dL 16 14 15   ?Creatinine 0.70 - 1.22 mg/dL 1.49(H) 1.34(H) 1.30(H)  ?Sodium 135 - 146 mmol/L 139 139 141  ?Potassium 3.5 - 5.3 mmol/L 4.6 3.7 4.1  ?Chloride 98 - 110 mmol/L 106 108 111  ?CO2 20 - 32 mmol/L 27 23 24   ?Calcium 8.6 - 10.3 mg/dL 8.7 8.5(L) 8.9  ?Total Protein 6.5 - 8.1 g/dL - 5.2(L) 5.6(L)  ?Total Bilirubin 0.2 - 1.2 mg/dL 1.6(H) 1.9(H) 2.4(H)  ?Alkaline Phos 38 - 126 U/L - 104 106  ?AST 15 - 41 U/L - 54(H) 37  ?ALT 0 - 44 U/L - 28 22  ?  ?Hepatic Function Latest Ref Rng & Units 05/14/2021 05/01/2021 04/30/2021  ?Total Protein 6.5 - 8.1 g/dL - 5.2(L) 5.6(L)  ?Albumin 3.5 - 5.0 g/dL - 2.3(L) 2.5(L)  ?AST 15 - 41 U/L - 54(H) 37  ?ALT 0 - 44 U/L - 28 22  ?Alk Phosphatase 38 - 126 U/L - 104 106  ?Total Bilirubin 0.2 - 1.2 mg/dL 1.6(H) 1.9(H) 2.4(H)  ?Bilirubin, Direct 0.0 - 0.2 mg/dL - 0.3(H) 0.3(H)  ?  ? ? ?Assessment: ? ?#1.  Hepatic encephalopathy.  Patient is not confused at this time but he has had confusion intermittently at home.  Therefore he would benefit from higher dose of lactulose which his wife is already been doing. ? ?#2.  Cirrhotic ascites.  Clinically he does not have ascites.  He has lost 13 pounds since his last visit 1 month ago.  Therefore will stop diuretic therapy and monitor his weight. ? ?#3.  History of gastric ulcer.Gastric ulcer was discovered on EGD of 02/13/2021.  He remains on double dose PPI.  We will schedule him for EGD to document complete healing of ulcer and also assess for esophageal varices. ? ?#4.  Anemia.  Anemia appears to be multifactorial although he may also have iron deficiency.  Will check his H&H. ? ?#5.  Decompensated cirrhosis secondary to NASH.  Given his age he is not a candidate for transplant evaluation.  Liver disease has resulted in multiple  complications including hepatic encephalopathy ascites thrombocytopenia and he also had portal vein thrombosis last December and underwent thrombectomy.  Ultrasound and Doppler did not reveal recurrence of thrombus.  TIPS was patent. ? ?#6.  History of urinary tract infection.  He had Staph epidermidis in his urine and he was treated for Macrodantin by me last month.  He has appointment to see Dr. Roni Bread in 2 days. ? ? ?Plan: ? ?Discontinue furosemide and spironolactone. ?Monitor daily weights and notify if he has gained more than 7 pounds. ?Patient will go to the  lab for CBC with differential, comprehensive chemistry panel, INR and serum ammonia. ?Follow-up with Dr. Quintin Alto regarding issues with his feet.  I am concerned he may have a circulation problem as well. ?Esophagogastroduodenoscopy under monitored anesthesia care in near future to document complete healing of gastric ulcer before PPI dose change. ?Office visit in 2 months. ? ? ? ? ? ?

## 2021-06-08 NOTE — Patient Instructions (Signed)
Discontinue furosemide and spironolactone. ?Notify if you gain more than 7 pounds. ?Please arrange office visit with Dr. Quintin Alto regarding these issues. ?Physician will call with results/. ?Esophagogastroduodenoscopy to be scheduled. ?

## 2021-06-09 ENCOUNTER — Ambulatory Visit (INDEPENDENT_AMBULATORY_CARE_PROVIDER_SITE_OTHER): Payer: Medicare Other | Admitting: Cardiology

## 2021-06-09 ENCOUNTER — Encounter: Payer: Self-pay | Admitting: Cardiology

## 2021-06-09 VITALS — BP 100/70 | HR 64 | Ht 68.0 in | Wt 179.8 lb

## 2021-06-09 DIAGNOSIS — I480 Paroxysmal atrial fibrillation: Secondary | ICD-10-CM

## 2021-06-09 DIAGNOSIS — Z01810 Encounter for preprocedural cardiovascular examination: Secondary | ICD-10-CM | POA: Diagnosis not present

## 2021-06-09 LAB — COMPREHENSIVE METABOLIC PANEL
AG Ratio: 0.8 (calc) — ABNORMAL LOW (ref 1.0–2.5)
ALT: 38 U/L (ref 9–46)
AST: 60 U/L — ABNORMAL HIGH (ref 10–35)
Albumin: 3 g/dL — ABNORMAL LOW (ref 3.6–5.1)
Alkaline phosphatase (APISO): 188 U/L — ABNORMAL HIGH (ref 35–144)
BUN/Creatinine Ratio: 11 (calc) (ref 6–22)
BUN: 21 mg/dL (ref 7–25)
CO2: 25 mmol/L (ref 20–32)
Calcium: 9.6 mg/dL (ref 8.6–10.3)
Chloride: 104 mmol/L (ref 98–110)
Creat: 1.94 mg/dL — ABNORMAL HIGH (ref 0.70–1.22)
Globulin: 3.7 g/dL (calc) (ref 1.9–3.7)
Glucose, Bld: 127 mg/dL — ABNORMAL HIGH (ref 65–99)
Potassium: 4.8 mmol/L (ref 3.5–5.3)
Sodium: 141 mmol/L (ref 135–146)
Total Bilirubin: 2.1 mg/dL — ABNORMAL HIGH (ref 0.2–1.2)
Total Protein: 6.7 g/dL (ref 6.1–8.1)

## 2021-06-09 LAB — CBC WITH DIFFERENTIAL/PLATELET
Absolute Monocytes: 650 cells/uL (ref 200–950)
Basophils Absolute: 117 cells/uL (ref 0–200)
Basophils Relative: 1.6 %
Eosinophils Absolute: 569 cells/uL — ABNORMAL HIGH (ref 15–500)
Eosinophils Relative: 7.8 %
HCT: 42.6 % (ref 38.5–50.0)
Hemoglobin: 14.3 g/dL (ref 13.2–17.1)
Lymphs Abs: 1365 cells/uL (ref 850–3900)
MCH: 31.1 pg (ref 27.0–33.0)
MCHC: 33.6 g/dL (ref 32.0–36.0)
MCV: 92.6 fL (ref 80.0–100.0)
MPV: 10.3 fL (ref 7.5–12.5)
Monocytes Relative: 8.9 %
Neutro Abs: 4599 cells/uL (ref 1500–7800)
Neutrophils Relative %: 63 %
Platelets: 203 10*3/uL (ref 140–400)
RBC: 4.6 10*6/uL (ref 4.20–5.80)
RDW: 14.2 % (ref 11.0–15.0)
Total Lymphocyte: 18.7 %
WBC: 7.3 10*3/uL (ref 3.8–10.8)

## 2021-06-09 LAB — PROTIME-INR
INR: 1.3 — ABNORMAL HIGH
Prothrombin Time: 13.1 s — ABNORMAL HIGH (ref 9.0–11.5)

## 2021-06-09 LAB — AMMONIA: Ammonia: 109 umol/L — ABNORMAL HIGH (ref <=72)

## 2021-06-09 NOTE — Progress Notes (Signed)
? ? ? ?Clinical Summary ?Tyler Farmer is a 81 y.o.male seen today for follow up of the following medical problems.  ?  ?  ?  ?  ?1.Cardiac arrhythmias/Afib/SVT ?-zio patch with rare ventricular ectopy. Frequent supraventricular ectopy, runs of SVT with longest 20 beats. 3.2 sec and 2.2 sec pauses in afternoon.  ?- afib previously diagnosed by pcp. I have reviewed the pcp EKG from diagnosis which confirms afib though we did not see it on recent monitor. He is actually in afib today as well by EKG ?- elevated HRs at recent GI appt, was started on coreg for both varices and elevated HRs.  ?- off eliquis, I assume due to prior GI bleed and history of varices.  ?  ?-admission Jan 2023 with afib with RVR ?-coreg changed to lopressor ?- no recent palpitations ? ? ?  ?2. Cirrhosis/hepatic encephalopathy/history portal vein thrombosis/Prior TIPS ? - at GI visit yesterday noted to have lost 13 lbs, diuretic stopped ?  ?3. History of GI bleeding ?- from GI had bleeding from gastric AVM, ablated. Melena resolved ?- also with esophageal varices.  ?- from recent GI notes history of gastric ulcer, plans for repeat EGD ? ?4. CKD ?- Cr yesterday 1.94, lasix was stopped just yesterday by GI.  ?- has appt pending with renal.  ? ?5. Chronic resp failure ?- he is on 3L since discharge.  ? ?6.Preop evaluation ?- plans for EGD next week with GI ? ?Past Medical History:  ?Diagnosis Date  ? A-fib ([place])   ? AKI (acute kidney injury) ([place]) 01/27/2021  ? Arthritis   ? Hands/Fingers  ? Asthma   ? Back pain   ? Cirrhosis of liver ([place])   ? Diabetes mellitus ([place])   ? Enlarged prostate   ? Gastric ulcer   ? GERD (gastroesophageal reflux disease)   ? H/O bladder problems   ? History of kidney stones   ? Hypercholesteremia   ? Hypertension   ? Prostate cancer ([place])   ? Thyroid disease   ? UGI bleed 10/26/2016  ? Vitamin B12 deficiency 10/26/2016  ? ? ? ?Allergies  ?Allergen Reactions  ? Penicillins Nausea And Vomiting  ?  Has patient had a PCN  reaction causing immediate rash, facial/tongue/throat swelling, SOB or lightheadedness with hypotension: Yes ?Has patient had a PCN reaction causing severe rash involving mucus membranes or skin necrosis: No ?Has patient had a PCN reaction that required hospitalization: No ?Has patient had a PCN reaction occurring within the last 10 years: No ?If all of the above answers are "NO", then may proceed with Cephalosporin use. ? ? ?Patient states that it also caused pain in his sides.  ? ? ? ?Current Outpatient Medications  ?Medication Sig Dispense Refill  ? acetaminophen (TYLENOL) 500 MG tablet Take 500 mg by mouth every 6 (six) hours as needed for mild pain.    ? aspirin EC 81 MG tablet Take 1 tablet (81 mg total) by mouth daily with breakfast. Swallow whole. 30 tablet 11  ? Cranberry 500 MG TABS Take 500 mg by mouth daily.    ? ferrous sulfate 325 (65 FE) MG tablet Take 325 mg by mouth daily with breakfast.    ? glipiZIDE (GLUCOTROL) 10 MG tablet Take 10 mg by mouth 2 (two) times daily before a meal.    ? guaiFENesin (MUCINEX) 600 MG 12 hr tablet Take by mouth 2 (two) times daily as needed.    ? lactulose (CHRONULAC) 10 GM/15ML solution Take  45 mLs (30 g total) by mouth 3 (three) times daily. TAKE 45 ML BY MOUTH TWICE DAILY 946 mL 5  ? levothyroxine (SYNTHROID) 137 MCG tablet Take 137 mcg by mouth daily before breakfast.    ? magnesium oxide (MAG-OX) 400 MG tablet Take 400 mg by mouth daily.    ? metoprolol tartrate (LOPRESSOR) 25 MG tablet Take 1.5 tablets (37.5 mg total) by mouth every morning. & 25 mg in the evening 135 tablet 2  ? ondansetron (ZOFRAN) 4 MG tablet Take 1 tablet (4 mg total) by mouth every 8 (eight) hours as needed for nausea or vomiting. 30 tablet 1  ? pantoprazole (PROTONIX) 40 MG tablet Take 1 tablet (40 mg total) by mouth 2 (two) times daily. 60 tablet 2  ? rifaximin (XIFAXAN) 550 MG TABS tablet Take 550 mg by mouth 2 (two) times daily.    ? simvastatin (ZOCOR) 40 MG tablet Take 40 mg by mouth at  bedtime.     ? ?No current facility-administered medications for this visit.  ? ? ? ?Past Surgical History:  ?Procedure Laterality Date  ? CIRCUMCISION    ? at age 54  ? COLONOSCOPY N/A 11/13/2015  ? Procedure: COLONOSCOPY;  Surgeon: Rogene Houston, MD;  [facility];  Service: Endoscopy;  Laterality: N/A;  2:10  ? COLONOSCOPY WITH PROPOFOL N/A 06/17/2020  ? Procedure: COLONOSCOPY WITH PROPOFOL;  Surgeon: Rogene Houston, MD;  [facility];  Service: Endoscopy;  Laterality: N/A;  AM / patient had positive covid 05/25/20  ? ESOPHAGEAL BANDING N/A 02/13/2021  ? Procedure: ESOPHAGEAL BANDING;  Surgeon: Montez Morita, Quillian Quince, MD;  [facility];  Service: Gastroenterology;  Laterality: N/A;  ? ESOPHAGOGASTRODUODENOSCOPY N/A 06/03/2014  ? Procedure: ESOPHAGOGASTRODUODENOSCOPY (EGD);  Surgeon: Rogene Houston, MD;  [facility];  Service: Endoscopy;  Laterality: N/A;  730  ? ESOPHAGOGASTRODUODENOSCOPY N/A 10/26/2016  ? Procedure: ESOPHAGOGASTRODUODENOSCOPY (EGD);  Surgeon: Rogene Houston, MD;  [facility];  Service: Endoscopy;  Laterality: N/A;  ? ESOPHAGOGASTRODUODENOSCOPY (EGD) WITH PROPOFOL N/A 06/17/2020  ? Procedure: ESOPHAGOGASTRODUODENOSCOPY (EGD) WITH PROPOFOL;  Surgeon: Rogene Houston, MD;  [facility];  Service: Endoscopy;  Laterality: N/A;  ? ESOPHAGOGASTRODUODENOSCOPY (EGD) WITH PROPOFOL N/A 02/13/2021  ? Procedure: ESOPHAGOGASTRODUODENOSCOPY (EGD) WITH PROPOFOL;  Surgeon: Harvel Quale, MD;  [facility];  Service: Gastroenterology;  Laterality: N/A;  ? Gastric Ulcer  1993  ? GIVENS CAPSULE STUDY N/A 10/28/2016  ? Procedure: GIVENS CAPSULE STUDY;  Surgeon: Rogene Houston, MD;  [facility];  Service: Endoscopy;  Laterality: N/A;  ? HERNIA REPAIR    ? IR EMBO VENOUS NOT HEMORR HEMANG  INC GUIDE ROADMAPPING  03/05/2021  ? IR INTRAVASCULAR ULTRASOUND NON CORONARY  03/05/2021  ? IR RADIOLOGIST EVAL & MGMT   02/26/2021  ? IR RADIOLOGIST EVAL & MGMT  05/04/2021  ? IR THROMBECT VENO MECH MOD SED  03/05/2021  ? IR TIPS  03/05/2021  ? IR US GUIDE VASC ACCESS RIGHT  03/05/2021  ? IR US GUIDE VASC ACCESS RIGHT  03/05/2021  ? POLYPECTOMY  11/13/2015  ? Procedure: POLYPECTOMY;  Surgeon: Rogene Houston, MD;  [facility];  Service: Endoscopy;;  colon  ? POLYPECTOMY  06/17/2020  ? Procedure: POLYPECTOMY;  Surgeon: Rogene Houston, MD;  [facility];  Service: Endoscopy;;  sigmoid  ? RADIOLOGY WITH ANESTHESIA N/A 03/05/2021  ? Procedure: RADIOLOGY WITH ANESTHESIA    I.R. TIPPS;  Surgeon: Suzette Battiest, MD;  Location: [facility];  Service: Radiology;  Laterality: N/A;  ? Right Elbow Right   ? A pin was put in  ? ? ? ?Allergies  ?Allergen Reactions  ? Penicillins Nausea And Vomiting  ?  Has patient had a PCN reaction causing immediate rash, facial/tongue/throat swelling, SOB or lightheadedness with hypotension: Yes ?Has patient had a PCN reaction causing severe rash involving mucus membranes or skin necrosis: No ?Has patient had a PCN reaction that required hospitalization: No ?Has patient had a PCN reaction occurring within the last 10 years: No ?If all of the above answers are "NO", then may proceed with Cephalosporin use. ? ? ?Patient states that it also caused pain in his sides.  ? ? ? ? ?Family History  ?Problem Relation Age of Onset  ? Emphysema Mother   ? Cerebrovascular Accident Father   ? Heart disease Father   ? Diabetes Sister   ? Heart disease Brother   ? Heart disease Sister   ? Heart disease Brother   ? Diabetes Brother   ? ? ? ?Social History ?Tyler Farmer reports that he quit smoking about 32 years ago. His smoking use included cigarettes. He quit smokeless tobacco use about 31 years ago. ?Tyler Farmer reports no history of alcohol use. ? ? ?Review of Systems ?CONSTITUTIONAL: No weight loss, fever, chills, weakness or fatigue.  ?HEENT: Eyes: No visual loss, blurred vision, double vision or yellow  sclerae.No hearing loss, sneezing, congestion, runny nose or sore throat.  ?SKIN: No rash or itching.  ?CARDIOVASCULAR: per hpi ?RESPIRATORY: No shortness of breath, cough or sputum.  ?GASTROINTESTINAL: No anore

## 2021-06-09 NOTE — Patient Instructions (Signed)

## 2021-06-09 NOTE — Patient Instructions (Signed)
? ? ? ? ? ? Tyler Farmer ? 06/09/2021  ?  ? @PREFPERIOPPHARMACY @ ? ? Your procedure is scheduled on  06/16/2021. ? ? Report to [facility] at  [email or web address]. ? ? Call this number if you have problems the morning of surgery: ? [phone] ? ? Remember: ? Follow the diet instructions given to you by the office. ? ?DO NOT take any medications for diabetes the morning of your procedure. ?  ? ? Take these medicines the morning of surgery with A SIP OF WATER  ? ?levothyroxine, metoprolol, zofran(if needed), protonix. ? ?  ? Do not wear jewelry, make-up or nail polish. ? Do not wear lotions, powders, or perfumes, or deodorant. ? Do not shave 48 hours prior to surgery.  Men may shave face and neck. ? Do not bring valuables to the hospital. ? [facility] is not responsible for any belongings or valuables. ? ?Contacts, dentures or bridgework may not be worn into surgery.  Leave your suitcase in the car.  After surgery it may be brought to your room. ? ?For patients admitted to the hospital, discharge time will be determined by your treatment team. ? ?Patients discharged the day of surgery will not be allowed to drive home and must have someone with them for 24 hours.  ? ? ?Special instructions:   DO NOT smoke tobacco or vape for 24 hours before your procedure. ? ?Please read over the following fact sheets that you were given. ?Anesthesia Post-op Instructions and Care and Recovery After Surgery ?  ? ? ? Upper Endoscopy, Adult, Care After ?This sheet gives you information about how to care for yourself after your procedure. Your health care provider may also give you more specific instructions. If you have problems or questions, contact your health care provider. ?What can I expect after the procedure? ?After the procedure, it is common to have: ?A sore throat. ?Mild stomach pain or discomfort. ?Bloating. ?Nausea. ?Follow these instructions at home: ? ?Follow instructions from your health care provider about what to eat or  drink after your procedure. ?Return to your normal activities as told by your health care provider. Ask your health care provider what activities are safe for you. ?Take over-the-counter and prescription medicines only as told by your health care provider. ?If you were given a sedative during the procedure, it can affect you for several hours. Do not drive or operate machinery until your health care provider says that it is safe. ?Keep all follow-up visits as told by your health care provider. This is important. ?Contact a health care provider if you have: ?A sore throat that lasts longer than one day. ?Trouble swallowing. ?Get help right away if: ?You vomit blood or your vomit looks like coffee grounds. ?You have: ?A fever. ?Bloody, black, or tarry stools. ?A severe sore throat or you cannot swallow. ?Difficulty breathing. ?Severe pain in your chest or abdomen. ?Summary ?After the procedure, it is common to have a sore throat, mild stomach discomfort, bloating, and nausea. ?If you were given a sedative during the procedure, it can affect you for several hours. Do not drive or operate machinery until your health care provider says that it is safe. ?Follow instructions from your health care provider about what to eat or drink after your procedure. ?Return to your normal activities as told by your health care provider. ?This information is not intended to replace advice given to you by your health care provider. Make sure you discuss any  questions you have with your health care provider. ?Document Revised: 01/11/2019 Document Reviewed: 08/07/2017 ?Elsevier Patient Education ? [place]. ?Monitored Anesthesia Care, Care After ?This sheet gives you information about how to care for yourself after your procedure. Your health care provider may also give you more specific instructions. If you have problems or questions, contact your health care provider. ?What can I expect after the procedure? ?After the procedure,  it is common to have: ?Tiredness. ?Forgetfulness about what happened after the procedure. ?Impaired judgment for important decisions. ?Nausea or vomiting. ?Some difficulty with balance. ?Follow these instructions at home: ?For the time period you were told by your health care provider: ?  ?Rest as needed. ?Do not participate in activities where you could fall or become injured. ?Do not drive or use machinery. ?Do not drink alcohol. ?Do not take sleeping pills or medicines that cause drowsiness. ?Do not make important decisions or sign legal documents. ?Do not take care of children on your own. ?Eating and drinking ?Follow the diet that is recommended by your health care provider. ?Drink enough fluid to keep your urine pale yellow. ?If you vomit: ?Drink water, juice, or soup when you can drink without vomiting. ?Make sure you have little or no nausea before eating solid foods. ?General instructions ?Have a responsible adult stay with you for the time you are told. It is important to have someone help care for you until you are awake and alert. ?Take over-the-counter and prescription medicines only as told by your health care provider. ?If you have sleep apnea, surgery and certain medicines can increase your risk for breathing problems. Follow instructions from your health care provider about wearing your sleep device: ?Anytime you are sleeping, including during daytime naps. ?While taking prescription pain medicines, sleeping medicines, or medicines that make you drowsy. ?Avoid smoking. ?Keep all follow-up visits as told by your health care provider. This is important. ?Contact a health care provider if: ?You keep feeling nauseous or you keep vomiting. ?You feel light-headed. ?You are still sleepy or having trouble with balance after 24 hours. ?You develop a rash. ?You have a fever. ?You have redness or swelling around the IV site. ?Get help right away if: ?You have trouble breathing. ?You have new-onset confusion at  home. ?Summary ?For several hours after your procedure, you may feel tired. You may also be forgetful and have poor judgment. ?Have a responsible adult stay with you for the time you are told. It is important to have someone help care for you until you are awake and alert. ?Rest as told. Do not drive or operate machinery. Do not drink alcohol or take sleeping pills. ?Get help right away if you have trouble breathing, or if you suddenly become confused. ?This information is not intended to replace advice given to you by your health care provider. Make sure you discuss any questions you have with your health care provider. ?Document Revised: 11/21/2019 Document Reviewed: 02/07/2019 ?Elsevier Patient Education ? [place]. ? ?

## 2021-06-10 ENCOUNTER — Ambulatory Visit (INDEPENDENT_AMBULATORY_CARE_PROVIDER_SITE_OTHER): Payer: Medicare Other | Admitting: Urology

## 2021-06-10 ENCOUNTER — Other Ambulatory Visit: Payer: Self-pay

## 2021-06-10 ENCOUNTER — Encounter: Payer: Self-pay | Admitting: Urology

## 2021-06-10 VITALS — BP 100/62 | HR 112

## 2021-06-10 DIAGNOSIS — N471 Phimosis: Secondary | ICD-10-CM

## 2021-06-10 DIAGNOSIS — K7290 Hepatic failure, unspecified without coma: Secondary | ICD-10-CM | POA: Diagnosis not present

## 2021-06-10 DIAGNOSIS — N480 Leukoplakia of penis: Secondary | ICD-10-CM

## 2021-06-10 DIAGNOSIS — E1129 Type 2 diabetes mellitus with other diabetic kidney complication: Secondary | ICD-10-CM | POA: Diagnosis not present

## 2021-06-10 DIAGNOSIS — N281 Cyst of kidney, acquired: Secondary | ICD-10-CM

## 2021-06-10 DIAGNOSIS — I10 Essential (primary) hypertension: Secondary | ICD-10-CM | POA: Diagnosis not present

## 2021-06-10 DIAGNOSIS — I872 Venous insufficiency (chronic) (peripheral): Secondary | ICD-10-CM | POA: Diagnosis not present

## 2021-06-10 DIAGNOSIS — N390 Urinary tract infection, site not specified: Secondary | ICD-10-CM

## 2021-06-10 DIAGNOSIS — R3129 Other microscopic hematuria: Secondary | ICD-10-CM | POA: Diagnosis not present

## 2021-06-10 DIAGNOSIS — E11628 Type 2 diabetes mellitus with other skin complications: Secondary | ICD-10-CM | POA: Diagnosis not present

## 2021-06-10 DIAGNOSIS — E1140 Type 2 diabetes mellitus with diabetic neuropathy, unspecified: Secondary | ICD-10-CM | POA: Diagnosis not present

## 2021-06-10 DIAGNOSIS — R609 Edema, unspecified: Secondary | ICD-10-CM | POA: Diagnosis not present

## 2021-06-10 LAB — MICROSCOPIC EXAMINATION: Renal Epithel, UA: NONE SEEN /hpf

## 2021-06-10 LAB — URINALYSIS, ROUTINE W REFLEX MICROSCOPIC
Bilirubin, UA: NEGATIVE
Glucose, UA: NEGATIVE
Leukocytes,UA: NEGATIVE
Nitrite, UA: NEGATIVE
Protein,UA: NEGATIVE
Specific Gravity, UA: 1.025 (ref 1.005–1.030)
Urobilinogen, Ur: 0.2 mg/dL (ref 0.2–1.0)
pH, UA: 6 (ref 5.0–7.5)

## 2021-06-10 NOTE — Progress Notes (Signed)
?Subjective: ? ?1. Frequent UTI   ?2. Microscopic hematuria   ?3. Balanitis xerotica obliterans   ?4. Phimosis   ?5. Renal cyst   ? ?  ?11/19/20: Tyler Farmer returns today in f/u for his history of prostate cancer treated with radiation therapy in 2005. He had high grade disease and was treated with 2 years of androgen ablation and EXRT. His PSA is 0.2 prior to this visit.  It was 0.4 last year. He is voiding well without complaints. He has nocturia x 1-2.  He is taking a cranberry supplement which helps.  He has no hematuria.  He has a little burning with urination. His UA looks infected today.  He has recurrent phimosis with a history of a prior circumcision for BXO. It is stable and doesn't bother him too much.  He has a history of stones but no suggestive symptoms. He has no other associated signs or symptoms.  He broke some fingers on his left hand go carting a couple of weeks ago.  ? ?06/10/21: Tyler Farmer returns today in f/u.  He was hospitalized in February for liver disease.  He was found to have a possible  UTI but the culture on 04/29/22 had Mx Species.  Another culture grew staff epid on 05/14/21 and he was given Nitrofurantoin on 05/19/21.   He has been off antibiotics for a week or so.  His UA today has 3-10 RBC's.  He had microhematuria on his last 2 UA's in the hospital as well. He is voiding well with and IPSS of 4 with nocturia x 2 and frequency. He has had no hematuria or dysuria.   His last PSA was 0.2 in 8/22.   He had an abdominal US on 04/29/21 and had normal kidneys without stones or obstruction but multiple simple cysts.  ?  ? ?ROS: ? ?ROS:  ?A complete review of systems was performed.  All systems are negative except for pertinent findings as noted.  ? ?Review of Systems  ?Genitourinary:  Positive for frequency.  ?Neurological:  Positive for weakness.  ?Endo/Heme/Allergies:  Positive for polydipsia.  ?Psychiatric/Behavioral:  Positive for memory loss.   ? ?Allergies  ?Allergen Reactions  ? Penicillins  Nausea And Vomiting  ?  Has patient had a PCN reaction causing immediate rash, facial/tongue/throat swelling, SOB or lightheadedness with hypotension: Yes ?Has patient had a PCN reaction causing severe rash involving mucus membranes or skin necrosis: No ?Has patient had a PCN reaction that required hospitalization: No ?Has patient had a PCN reaction occurring within the last 10 years: No ?If all of the above answers are "NO", then may proceed with Cephalosporin use. ? ? ?Patient states that it also caused pain in his sides.  ? ? ?Outpatient Encounter Medications as of 06/10/2021  ?Medication Sig  ? acetaminophen (TYLENOL) 500 MG tablet Take 500 mg by mouth every 6 (six) hours as needed for mild pain.  ? aspirin EC 81 MG tablet Take 1 tablet (81 mg total) by mouth daily with breakfast. Swallow whole.  ? Cranberry 500 MG TABS Take 500 mg by mouth daily.  ? ferrous sulfate 325 (65 FE) MG tablet Take 325 mg by mouth daily with breakfast.  ? glipiZIDE (GLUCOTROL) 10 MG tablet Take 10 mg by mouth 2 (two) times daily before a meal.  ? guaiFENesin (MUCINEX) 600 MG 12 hr tablet Take 600 mg by mouth 2 (two) times daily as needed for cough or to loosen phlegm.  ? lactulose (CHRONULAC) 10 GM/15ML solution Take  45 mLs (30 g total) by mouth 3 (three) times daily. TAKE 45 ML BY MOUTH TWICE DAILY (Patient taking differently: Take 30 g by mouth 3 (three) times daily.)  ? levothyroxine (SYNTHROID) 137 MCG tablet Take 137 mcg by mouth daily before breakfast.  ? lisinopril (ZESTRIL) 5 MG tablet Take 5 mg by mouth daily.  ? magnesium oxide (MAG-OX) 400 (240 Mg) MG tablet Take 1 tablet by mouth daily.  ? magnesium oxide (MAG-OX) 400 MG tablet Take 400 mg by mouth daily.  ? metoprolol tartrate (LOPRESSOR) 25 MG tablet Take 1.5 tablets (37.5 mg total) by mouth every morning. & 25 mg in the evening  ? ondansetron (ZOFRAN) 4 MG tablet Take 1 tablet (4 mg total) by mouth every 8 (eight) hours as needed for nausea or vomiting.  ? OXYGEN Inhale  3 L into the lungs continuous.  ? pantoprazole (PROTONIX) 40 MG tablet Take 1 tablet (40 mg total) by mouth 2 (two) times daily.  ? rifaximin (XIFAXAN) 550 MG TABS tablet Take 550 mg by mouth 2 (two) times daily.  ? simvastatin (ZOCOR) 40 MG tablet Take 40 mg by mouth at bedtime.   ? ?No facility-administered encounter medications on file as of 06/10/2021.  ? ? ?Past Medical History:  ?Diagnosis Date  ? A-fib ([place])   ? AKI (acute kidney injury) ([place]) 01/27/2021  ? Arthritis   ? Hands/Fingers  ? Asthma   ? Back pain   ? Cirrhosis of liver ([place])   ? Diabetes mellitus ([place])   ? Enlarged prostate   ? Gastric ulcer   ? GERD (gastroesophageal reflux disease)   ? H/O bladder problems   ? History of kidney stones   ? Hypercholesteremia   ? Hypertension   ? Prostate cancer ([place])   ? Thyroid disease   ? UGI bleed 10/26/2016  ? Vitamin B12 deficiency 10/26/2016  ? ? ?Past Surgical History:  ?Procedure Laterality Date  ? CIRCUMCISION    ? at age 31  ? COLONOSCOPY N/A 11/13/2015  ? Procedure: COLONOSCOPY;  Surgeon: Rogene Houston, MD;  [facility];  Service: Endoscopy;  Laterality: N/A;  2:10  ? COLONOSCOPY WITH PROPOFOL N/A 06/17/2020  ? Procedure: COLONOSCOPY WITH PROPOFOL;  Surgeon: Rogene Houston, MD;  [facility];  Service: Endoscopy;  Laterality: N/A;  AM / patient had positive covid 05/25/20  ? ESOPHAGEAL BANDING N/A 02/13/2021  ? Procedure: ESOPHAGEAL BANDING;  Surgeon: Montez Morita, Quillian Quince, MD;  [facility];  Service: Gastroenterology;  Laterality: N/A;  ? ESOPHAGOGASTRODUODENOSCOPY N/A 06/03/2014  ? Procedure: ESOPHAGOGASTRODUODENOSCOPY (EGD);  Surgeon: Rogene Houston, MD;  [facility];  Service: Endoscopy;  Laterality: N/A;  730  ? ESOPHAGOGASTRODUODENOSCOPY N/A 10/26/2016  ? Procedure: ESOPHAGOGASTRODUODENOSCOPY (EGD);  Surgeon: Rogene Houston, MD;  [facility];  Service: Endoscopy;  Laterality: N/A;  ? ESOPHAGOGASTRODUODENOSCOPY (EGD) WITH PROPOFOL N/A  06/17/2020  ? Procedure: ESOPHAGOGASTRODUODENOSCOPY (EGD) WITH PROPOFOL;  Surgeon: Rogene Houston, MD;  [facility];  Service: Endoscopy;  Laterality: N/A;  ? ESOPHAGOGASTRODUODENOSCOPY (EGD) WITH PROPOFOL N/A 02/13/2021  ? Procedure: ESOPHAGOGASTRODUODENOSCOPY (EGD) WITH PROPOFOL;  Surgeon: Harvel Quale, MD;  [facility];  Service: Gastroenterology;  Laterality: N/A;  ? Gastric Ulcer  1993  ? GIVENS CAPSULE STUDY N/A 10/28/2016  ? Procedure: GIVENS CAPSULE STUDY;  Surgeon: Rogene Houston, MD;  [facility];  Service: Endoscopy;  Laterality: N/A;  ? HERNIA REPAIR    ?  IR EMBO VENOUS NOT HEMORR HEMANG  INC GUIDE ROADMAPPING  03/05/2021  ? IR INTRAVASCULAR ULTRASOUND NON CORONARY  03/05/2021  ? IR RADIOLOGIST EVAL & MGMT  02/26/2021  ? IR RADIOLOGIST EVAL & MGMT  05/04/2021  ? IR THROMBECT VENO MECH MOD SED  03/05/2021  ? IR TIPS  03/05/2021  ? IR US GUIDE VASC ACCESS RIGHT  03/05/2021  ? IR US GUIDE VASC ACCESS RIGHT  03/05/2021  ? POLYPECTOMY  11/13/2015  ? Procedure: POLYPECTOMY;  Surgeon: Rogene Houston, MD;  [facility];  Service: Endoscopy;;  colon  ? POLYPECTOMY  06/17/2020  ? Procedure: POLYPECTOMY;  Surgeon: Rogene Houston, MD;  [facility];  Service: Endoscopy;;  sigmoid  ? RADIOLOGY WITH ANESTHESIA N/A 03/05/2021  ? Procedure: RADIOLOGY WITH ANESTHESIA    [facility].R. Rise Paganini;  Surgeon: Suzette Battiest, MD;  Location: [facility];  Service: Radiology;  Laterality: N/A;  ? Right Elbow Right   ? A pin was put in  ? ? ?Social History  ? ?Socioeconomic History  ? Marital status: Married  ?  Spouse name: Not on file  ? Number of children: Not on file  ? Years of education: Not on file  ? Highest education level: Not on file  ?Occupational History  ? Not on file  ?Tobacco Use  ? Smoking status: Former  ?  Types: Cigarettes  ?  Quit date: 05/25/1989  ?  Years since quitting: 32.0  ? Smokeless tobacco: Former  ?  Quit date: 01/16/1990  ?Vaping Use  ? Vaping  Use: Never used  ?Substance and Sexual Activity  ? Alcohol use: No  ?  Alcohol/week: 0.0 standard drinks  ?  Comment: Drank many years - 30 years ago  ? Drug use: No  ? Sexual activity: Not on file  ?Oth

## 2021-06-11 ENCOUNTER — Encounter (HOSPITAL_COMMUNITY): Payer: Self-pay

## 2021-06-11 ENCOUNTER — Encounter (HOSPITAL_COMMUNITY)
Admission: RE | Admit: 2021-06-11 | Discharge: 2021-06-11 | Disposition: A | Discharge status: Home / Self Care | Payer: Medicare Other | Source: Ambulatory Visit | Attending: Internal Medicine | Admitting: Internal Medicine

## 2021-06-11 ENCOUNTER — Other Ambulatory Visit: Payer: Self-pay

## 2021-06-11 ENCOUNTER — Encounter (HOSPITAL_COMMUNITY): Payer: Medicare Other

## 2021-06-11 NOTE — Pre-Procedure Instructions (Signed)
Attempted pre-op phone call. Left Voicemail for him to call us back. ?

## 2021-06-15 DIAGNOSIS — I509 Heart failure, unspecified: Secondary | ICD-10-CM | POA: Diagnosis not present

## 2021-06-16 ENCOUNTER — Ambulatory Visit (HOSPITAL_BASED_OUTPATIENT_CLINIC_OR_DEPARTMENT_OTHER): Payer: Medicare Other | Admitting: Certified Registered Nurse Anesthetist

## 2021-06-16 ENCOUNTER — Encounter (HOSPITAL_COMMUNITY)
Admission: RE | Disposition: A | Discharge status: Home / Self Care | Payer: Self-pay | Source: Home / Self Care | Attending: Internal Medicine

## 2021-06-16 ENCOUNTER — Ambulatory Visit (HOSPITAL_COMMUNITY)
Admission: RE | Admit: 2021-06-16 | Discharge: 2021-06-16 | Disposition: A | Discharge status: Home / Self Care | Payer: Medicare Other | Attending: Internal Medicine | Admitting: Internal Medicine

## 2021-06-16 ENCOUNTER — Encounter (HOSPITAL_COMMUNITY): Payer: Self-pay | Admitting: Internal Medicine

## 2021-06-16 ENCOUNTER — Ambulatory Visit (HOSPITAL_COMMUNITY): Payer: Medicare Other | Admitting: Certified Registered Nurse Anesthetist

## 2021-06-16 ENCOUNTER — Telehealth (INDEPENDENT_AMBULATORY_CARE_PROVIDER_SITE_OTHER): Payer: Self-pay

## 2021-06-16 DIAGNOSIS — K7460 Unspecified cirrhosis of liver: Secondary | ICD-10-CM | POA: Insufficient documentation

## 2021-06-16 DIAGNOSIS — E039 Hypothyroidism, unspecified: Secondary | ICD-10-CM | POA: Diagnosis not present

## 2021-06-16 DIAGNOSIS — K259 Gastric ulcer, unspecified as acute or chronic, without hemorrhage or perforation: Secondary | ICD-10-CM | POA: Insufficient documentation

## 2021-06-16 DIAGNOSIS — I8500 Esophageal varices without bleeding: Secondary | ICD-10-CM | POA: Insufficient documentation

## 2021-06-16 DIAGNOSIS — I129 Hypertensive chronic kidney disease with stage 1 through stage 4 chronic kidney disease, or unspecified chronic kidney disease: Secondary | ICD-10-CM | POA: Diagnosis not present

## 2021-06-16 DIAGNOSIS — N179 Acute kidney failure, unspecified: Secondary | ICD-10-CM | POA: Diagnosis not present

## 2021-06-16 DIAGNOSIS — Z79899 Other long term (current) drug therapy: Secondary | ICD-10-CM | POA: Insufficient documentation

## 2021-06-16 DIAGNOSIS — Z87891 Personal history of nicotine dependence: Secondary | ICD-10-CM | POA: Insufficient documentation

## 2021-06-16 DIAGNOSIS — I8510 Secondary esophageal varices without bleeding: Secondary | ICD-10-CM | POA: Diagnosis not present

## 2021-06-16 DIAGNOSIS — K3184 Gastroparesis: Secondary | ICD-10-CM

## 2021-06-16 DIAGNOSIS — K219 Gastro-esophageal reflux disease without esophagitis: Secondary | ICD-10-CM | POA: Insufficient documentation

## 2021-06-16 DIAGNOSIS — I10 Essential (primary) hypertension: Secondary | ICD-10-CM | POA: Diagnosis not present

## 2021-06-16 DIAGNOSIS — E119 Type 2 diabetes mellitus without complications: Secondary | ICD-10-CM | POA: Insufficient documentation

## 2021-06-16 DIAGNOSIS — D649 Anemia, unspecified: Secondary | ICD-10-CM

## 2021-06-16 HISTORY — PX: ESOPHAGOGASTRODUODENOSCOPY (EGD) WITH PROPOFOL: SHX5813

## 2021-06-16 LAB — BASIC METABOLIC PANEL
Anion gap: 4 — ABNORMAL LOW (ref 5–15)
BUN: 17 mg/dL (ref 8–23)
CO2: 23 mmol/L (ref 22–32)
Calcium: 7.7 mg/dL — ABNORMAL LOW (ref 8.9–10.3)
Chloride: 108 mmol/L (ref 98–111)
Creatinine, Ser: 1.26 mg/dL — ABNORMAL HIGH (ref 0.61–1.24)
GFR, Estimated: 58 mL/min — ABNORMAL LOW (ref >60)
Glucose, Bld: 99 mg/dL (ref 70–99)
Potassium: 4 mmol/L (ref 3.5–5.1)
Sodium: 135 mmol/L (ref 135–145)

## 2021-06-16 LAB — GLUCOSE, CAPILLARY: Glucose-Capillary: 83 mg/dL (ref 70–99)

## 2021-06-16 SURGERY — ESOPHAGOGASTRODUODENOSCOPY (EGD) WITH PROPOFOL
Anesthesia: General

## 2021-06-16 MED ORDER — PHENYLEPHRINE 40 MCG/ML (10ML) SYRINGE FOR IV PUSH (FOR BLOOD PRESSURE SUPPORT)
PREFILLED_SYRINGE | INTRAVENOUS | Status: AC
  Filled 2021-06-16: qty 10

## 2021-06-16 MED ORDER — LACTATED RINGERS IV SOLN: INTRAVENOUS | Status: DC

## 2021-06-16 MED ORDER — ETOMIDATE 2 MG/ML IV SOLN
INTRAVENOUS | Status: DC | PRN
  Administered 2021-06-16: 4 mg via INTRAVENOUS

## 2021-06-16 MED ORDER — LIDOCAINE HCL (PF) 2 % IJ SOLN
INTRAMUSCULAR | Status: DC | PRN
  Administered 2021-06-16: 50 mg via INTRADERMAL

## 2021-06-16 MED ORDER — PROPOFOL 10 MG/ML IV BOLUS
INTRAVENOUS | Status: DC | PRN
  Administered 2021-06-16: 50 mg via INTRAVENOUS

## 2021-06-16 MED ORDER — ETOMIDATE 2 MG/ML IV SOLN
INTRAVENOUS | Status: AC
  Filled 2021-06-16: qty 10

## 2021-06-16 MED ORDER — PHENYLEPHRINE HCL (PRESSORS) 10 MG/ML IV SOLN
INTRAVENOUS | Status: DC | PRN
  Administered 2021-06-16: 40 ug via INTRAVENOUS
  Administered 2021-06-16 (×2): 80 ug via INTRAVENOUS

## 2021-06-16 MED ORDER — LIDOCAINE HCL (PF) 2 % IJ SOLN
INTRAMUSCULAR | Status: AC
  Filled 2021-06-16: qty 5

## 2021-06-16 NOTE — Telephone Encounter (Signed)
[name] is calling stating that Tyler Farmer was told this morning by Dr Laural Golden to stay on clear liquids but he needs to eat something when he takes his lactulose or it makes him sick, please advise? ?

## 2021-06-16 NOTE — Transfer of Care (Signed)
Immediate Anesthesia Transfer of Care Note ? ?Patient: Tyler Farmer ? ?Procedure(s) Performed: ESOPHAGOGASTRODUODENOSCOPY (EGD) WITH PROPOFOL ? ?Patient Location: PACU ? ?Anesthesia Type:General ? ?Level of Consciousness: awake, alert  and oriented ? ?Airway & Oxygen Therapy: Patient Spontanous Breathing and Patient connected to nasal cannula oxygen ? ?Post-op Assessment: Report given to RN and Post -op Vital signs reviewed and stable ? ?Post vital signs: Reviewed ? ?Last Vitals:  ?Vitals Value Taken Time  ?BP 141/82   ?Temp 36.7   ?Pulse 100   ?Resp 15   ?SpO2 96   ? ? ?Last Pain:  ?Vitals:  ? 06/16/21 0859  ?TempSrc:   ?PainSc: 0-No pain  ?   ? ?Patients Stated Pain Goal: 5 (06/16/21 0759) ? ?Complications: No notable events documented. ?

## 2021-06-16 NOTE — Anesthesia Postprocedure Evaluation (Signed)
Anesthesia Post Note ? ?Patient: Coltrane Tugwell Higashi ? ?Procedure(s) Performed: ESOPHAGOGASTRODUODENOSCOPY (EGD) WITH PROPOFOL ? ?Patient location during evaluation: Phase II ?Anesthesia Type: General ?Level of consciousness: awake ?Pain management: pain level controlled ?Vital Signs Assessment: post-procedure vital signs reviewed and stable ?Respiratory status: spontaneous breathing and respiratory function stable ?Cardiovascular status: blood pressure returned to baseline and stable ?Postop Assessment: no headache and no apparent nausea or vomiting ?Anesthetic complications: no ?Comments: Late entry ? ? ?No notable events documented. ? ? ?Last Vitals:  ?Vitals:  ? 06/16/21 0747 06/16/21 0903  ?BP: 108/81 (!) 141/82  ?Pulse: 95   ?Resp: 14   ?Temp: 36.4 ?C   ?SpO2: 100%   ?  ?Last Pain:  ?Vitals:  ? 06/16/21 0903  ?TempSrc: Oral  ?PainSc: 0-No pain  ? ? ?  ?  ?  ?  ?  ?  ? ?Louann Sjogren ? ? ? ? ?

## 2021-06-16 NOTE — Anesthesia Preprocedure Evaluation (Signed)
Anesthesia Evaluation  ?Patient identified by MRN, date of birth, ID band ?Patient awake ? ? ? ?Reviewed: ?Allergy & Precautions, H&P , NPO status , Patient's Chart, lab work & pertinent test results, reviewed documented beta blocker date and time  ? ?Airway ?Mallampati: II ? ?TM Distance: >3 FB ?Neck ROM: full ? ? ? Dental ?no notable dental hx. ? ?  ?Pulmonary ?asthma , pneumonia, resolved, COPD, former smoker,  ?  ?Pulmonary exam normal ?breath sounds clear to auscultation ? ? ? ? ? ? Cardiovascular ?Exercise Tolerance: Good ?hypertension, negative cardio ROS ? ? ?Rhythm:regular Rate:Normal ? ? ?  ?Neuro/Psych ?negative neurological ROS ? negative psych ROS  ? GI/Hepatic ?PUD, GERD  Medicated,(+) Cirrhosis  ? Esophageal Varices ?  ? , Hepatitis -  ?Endo/Other  ?diabetesHypothyroidism  ? Renal/GU ?ARF and CRFRenal disease  ?negative genitourinary ?  ?Musculoskeletal ? ? Abdominal ?  ?Peds ? Hematology ? ?(+) Blood dyscrasia, anemia ,   ?Anesthesia Other Findings ? ? Reproductive/Obstetrics ?negative OB ROS ? ?  ? ? ? ? ? ? ? ? ? ? ? ? ? ?  ?  ? ? ? ? ? ? ? ? ?Anesthesia Physical ?Anesthesia Plan ? ?ASA: 3 ? ?Anesthesia Plan: General  ? ?Post-op Pain Management:   ? ?Induction:  ? ?PONV Risk Score and Plan: Propofol infusion ? ?Airway Management Planned:  ? ?Additional Equipment:  ? ?Intra-op Plan:  ? ?Post-operative Plan:  ? ?Informed Consent: I have reviewed the patients History and Physical, chart, labs and discussed the procedure including the risks, benefits and alternatives for the proposed anesthesia with the patient or authorized representative who has indicated his/her understanding and acceptance.  ? ? ? ?Dental Advisory Given ? ?Plan Discussed with: CRNA ? ?Anesthesia Plan Comments:   ? ? ? ? ? ? ?Anesthesia Quick Evaluation ? ?

## 2021-06-16 NOTE — Discharge Instructions (Addendum)
Resume usual medications including aspirin as before. ?Clear liquids for 24 hours and thereafter modified carb diet. ?No driving for 24 hours. ?Solid-phase gastric emptying study to be scheduled.  Office will ca ? ? ? ?PATIENT INSTRUCTIONS ?POST-ANESTHESIA ? ?IMMEDIATELY FOLLOWING SURGERY:  Do not drive or operate machinery for the first twenty four hours after surgery.  Do not make any important decisions for twenty four hours after surgery or while taking narcotic pain medications or sedatives.  If you develop intractable nausea and vomiting or a severe headache please notify your doctor immediately. ? ?FOLLOW-UP:  Please make an appointment with your surgeon as instructed. You do not need to follow up with anesthesia unless specifically instructed to do so. ? ?WOUND CARE INSTRUCTIONS (if applicable):  Keep a dry clean dressing on the anesthesia/puncture wound site if there is drainage.  Once the wound has quit draining you may leave it open to air.  Generally you should leave the bandage intact for twenty four hours unless there is drainage.  If the epidural site drains for more than 36-48 hours please call the anesthesia department. ? ?QUESTIONS?:  Please feel free to call your physician or the hospital operator if you have any questions, and they will be happy to assist you.    ? ? ? ?

## 2021-06-16 NOTE — Op Note (Signed)
[facility] ?Patient Name: Tyler Farmer ?Procedure Date: 06/16/2021 8:40 AM ?MRN: [id number] ?Date of Birth: December 02, 1940 ?Attending MD: Hildred Laser , MD ?CSN: [id number] ?Age: 81 ?Admit Type: Outpatient ?Procedure:                Upper GI endoscopy ?Indications:              Follow-up of gastric ulcer, Esophageal varices,  ?                          Follow-up of esophageal varices ?Providers:                Hildred Laser, MD, Janeece Riggers, RN, Wynonia Musty  ?                          Tech, Technician ?Referring MD:             Consuello Masse, MD ?Medicines:                Propofol per Anesthesia ?Complications:            No immediate complications. ?Estimated Blood Loss:     Estimated blood loss: none. ?Procedure:                Pre-Anesthesia Assessment: ?                          - Prior to the procedure, a History and Physical  ?                          was performed, and patient medications and  ?                          allergies were reviewed. The patient's tolerance of  ?                          previous anesthesia was also reviewed. The risks  ?                          and benefits of the procedure and the sedation  ?                          options and risks were discussed with the patient.  ?                          All questions were answered, and informed consent  ?                          was obtained. Prior Anticoagulants: The patient has  ?                          taken no previous anticoagulant or antiplatelet  ?                          agents except for aspirin. ASA Grade Assessment:  ?  III - A patient with severe systemic disease. After  ?                          reviewing the risks and benefits, the patient was  ?                          deemed in satisfactory condition to undergo the  ?                          procedure. ?                          After obtaining informed consent, the endoscope was  ?                          passed under direct  vision. Throughout the  ?                          procedure, the patient's blood pressure, pulse, and  ?                          oxygen saturations were monitored continuously. The  ?                          GIF-H190 ([id number]) scope was introduced through the  ?                          with the intention of advancing to the stomach. The  ?                          scope was advanced to the gastric body before the  ?                          procedure was aborted. Medications were given. The  ?                          upper GI endoscopy was accomplished without  ?                          difficulty. The procedure was aborted due to  ?                          presence of food. The upper GI endoscopy was  ?                          accomplished without difficulty. ?Scope In: 9:00:24 AM ?Scope Out: 9:01:26 AM ?Total Procedure Duration: 0 hours 1 minute 2 seconds  ?Findings: ?     The hypopharynx was normal. ?     The gastroesophageal junction was normal. ?     A large amount of food (residue) was found in the gastric fundus and in  ?     the gastric body. ?Impression:               - The procedure was aborted due to  presence of food  ?                          large amount of food in the stomach. ?                          - Normal hypopharynx. ?                          - Normal gastroesophageal junction. ?                          - A large amount of food (residue) in the stomach. ?                          - No specimens collected. ?Moderate Sedation: ?     Per Anesthesia Care ?Recommendation:           - Patient has a contact number available for  ?                          emergencies. The signs and symptoms of potential  ?                          delayed complications were discussed with the  ?                          patient. Return to normal activities tomorrow.  ?                          Written discharge instructions were provided to the  ?                          patient. ?                           - Clear liquid diet today. ?                          - Continue present medications. ?                          - Schedule solid-phase gastric emptying study. ?                          - Repeat upper endoscopy (date not yet determined)  ?                          to check healing. ?Procedure Code(s):        --- Professional --- ?                          51700, 52, Esophagogastroduodenoscopy, flexible,  ?                          transoral; diagnostic, including collection of  ?  specimen(s) by brushing or washing, when performed  ?                          (separate procedure) ?Diagnosis Code(s):        --- Professional --- ?                          K25.9, Gastric ulcer, unspecified as acute or  ?                          chronic, without hemorrhage or perforation ?                          I85.00, Esophageal varices without bleeding ?CPT copyright 2019 [employer]. All rights reserved. ?The codes documented in this report are preliminary and upon coder review may  ?be revised to meet current compliance requirements. ?Hildred Laser, MD ?Hildred Laser, MD ?06/16/2021 9:10:34 AM ?This report has been signed electronically. ?Number of Addenda: 0 ?

## 2021-06-16 NOTE — H&P (Signed)
Tyler Farmer is an 81 y.o. male.   ?Chief Complaint: Patient is here for esophagogastroduodenoscopy. ?HPI: Patient is 81 year old Caucasian male with multiple medical problems including advanced cirrhosis who was found to have bleeding gastric ulcer and November 2022.  He also underwent esophageal variceal banding.  He has been maintained on double dose PPI.  Since that intervention he has undergone portal vein thrombectomy and placement of TIPS.  His hemoglobin is near normal.  He is undergoing EGD to document healing of gastric ulcer and band varices if indicated. ?He denies nausea vomiting epigastric pain melena or rectal bleeding. ? ?Past Medical History:  ?Diagnosis Date  ? A-fib ([place])   ? AKI (acute kidney injury) ([place]) 01/27/2021  ? Arthritis   ? Hands/Fingers  ? Asthma   ? Back pain   ? Cirrhosis of liver ([place])   ? Diabetes mellitus ([place])   ? Enlarged prostate   ? Gastric ulcer   ? GERD (gastroesophageal reflux disease)   ? H/O bladder problems   ? History of kidney stones   ? Hypercholesteremia   ? Hypertension   ? Prostate cancer ([place])   ? Thyroid disease   ? UGI bleed 10/26/2016  ? Vitamin B12 deficiency 10/26/2016  ? ? ?Past Surgical History:  ?Procedure Laterality Date  ? CIRCUMCISION    ? at age 59  ? COLONOSCOPY N/A 11/13/2015  ? Procedure: COLONOSCOPY;  Surgeon: Rogene Houston, MD;  [facility];  Service: Endoscopy;  Laterality: N/A;  2:10  ? COLONOSCOPY WITH PROPOFOL N/A 06/17/2020  ? Procedure: COLONOSCOPY WITH PROPOFOL;  Surgeon: Rogene Houston, MD;  [facility];  Service: Endoscopy;  Laterality: N/A;  AM / patient had positive covid 05/25/20  ? ESOPHAGEAL BANDING N/A 02/13/2021  ? Procedure: ESOPHAGEAL BANDING;  Surgeon: Montez Morita, Quillian Quince, MD;  [facility];  Service: Gastroenterology;  Laterality: N/A;  ? ESOPHAGOGASTRODUODENOSCOPY N/A 06/03/2014  ? Procedure: ESOPHAGOGASTRODUODENOSCOPY (EGD);  Surgeon: Rogene Houston, MD;  [facility];   Service: Endoscopy;  Laterality: N/A;  730  ? ESOPHAGOGASTRODUODENOSCOPY N/A 10/26/2016  ? Procedure: ESOPHAGOGASTRODUODENOSCOPY (EGD);  Surgeon: Rogene Houston, MD;  [facility];  Service: Endoscopy;  Laterality: N/A;  ? ESOPHAGOGASTRODUODENOSCOPY (EGD) WITH PROPOFOL N/A 06/17/2020  ? Procedure: ESOPHAGOGASTRODUODENOSCOPY (EGD) WITH PROPOFOL;  Surgeon: Rogene Houston, MD;  [facility];  Service: Endoscopy;  Laterality: N/A;  ? ESOPHAGOGASTRODUODENOSCOPY (EGD) WITH PROPOFOL N/A 02/13/2021  ? Procedure: ESOPHAGOGASTRODUODENOSCOPY (EGD) WITH PROPOFOL;  Surgeon: Harvel Quale, MD;  [facility];  Service: Gastroenterology;  Laterality: N/A;  ? Gastric Ulcer  1993  ? GIVENS CAPSULE STUDY N/A 10/28/2016  ? Procedure: GIVENS CAPSULE STUDY;  Surgeon: Rogene Houston, MD;  [facility];  Service: Endoscopy;  Laterality: N/A;  ? HERNIA REPAIR    ? IR EMBO VENOUS NOT HEMORR HEMANG  INC GUIDE ROADMAPPING  03/05/2021  ? IR INTRAVASCULAR ULTRASOUND NON CORONARY  03/05/2021  ? IR RADIOLOGIST EVAL & MGMT  02/26/2021  ? IR RADIOLOGIST EVAL & MGMT  05/04/2021  ? IR THROMBECT VENO MECH MOD SED  03/05/2021  ? IR TIPS  03/05/2021  ? IR US GUIDE VASC ACCESS RIGHT  03/05/2021  ? IR US GUIDE VASC ACCESS RIGHT  03/05/2021  ? POLYPECTOMY  11/13/2015  ? Procedure: POLYPECTOMY;  Surgeon: Rogene Houston, MD;  [facility];  Service: Endoscopy;;  colon  ? POLYPECTOMY  06/17/2020  ? Procedure: POLYPECTOMY;  Surgeon: Rogene Houston, MD;  [facility];  Service: Endoscopy;;  sigmoid  ? RADIOLOGY WITH ANESTHESIA N/A 03/05/2021  ? Procedure: RADIOLOGY WITH ANESTHESIA    [facility].R. Rise Paganini;  Surgeon: Suzette Battiest, MD;  Location: [facility];  Service: Radiology;  Laterality: N/A;  ? Right Elbow Right   ? A pin was put in  ? ? ?Family History  ?Problem Relation Age of Onset  ? Emphysema Mother   ? Cerebrovascular Accident Father   ? Heart disease Father   ? Diabetes Sister   ? Heart  disease Brother   ? Heart disease Sister   ? Heart disease Brother   ? Diabetes Brother   ? ?Social History:  reports that he quit smoking about 32 years ago. His smoking use included cigarettes. He quit smokeless tobacco use about 31 years ago. He reports that he does not drink alcohol and does not use drugs. ? ?Allergies:  ?Allergies  ?Allergen Reactions  ? Penicillins Nausea And Vomiting  ?  Has patient had a PCN reaction causing immediate rash, facial/tongue/throat swelling, SOB or lightheadedness with hypotension: Yes ?Has patient had a PCN reaction causing severe rash involving mucus membranes or skin necrosis: No ?Has patient had a PCN reaction that required hospitalization: No ?Has patient had a PCN reaction occurring within the last 10 years: No ?If all of the above answers are "NO", then may proceed with Cephalosporin use. ? ? ?Patient states that it also caused pain in his sides.  ? ? ?Medications Prior to Admission  ?Medication Sig Dispense Refill  ? acetaminophen (TYLENOL) 500 MG tablet Take 500 mg by mouth every 6 (six) hours as needed for mild pain.    ? aspirin EC 81 MG tablet Take 1 tablet (81 mg total) by mouth daily with breakfast. Swallow whole. 30 tablet 11  ? Cranberry 500 MG TABS Take 500 mg by mouth daily.    ? ferrous sulfate 325 (65 FE) MG tablet Take 325 mg by mouth daily with breakfast.    ? glipiZIDE (GLUCOTROL) 10 MG tablet Take 10 mg by mouth 2 (two) times daily before a meal.    ? guaiFENesin (MUCINEX) 600 MG 12 hr tablet Take 600 mg by mouth 2 (two) times daily as needed for cough or to loosen phlegm.    ? lactulose (CHRONULAC) 10 GM/15ML solution Take 45 mLs (30 g total) by mouth 3 (three) times daily. TAKE 45 ML BY MOUTH TWICE DAILY (Patient taking differently: Take 30 g by mouth 3 (three) times daily.) 946 mL 5  ? levothyroxine (SYNTHROID) 137 MCG tablet Take 137 mcg by mouth daily before breakfast.    ? lisinopril (ZESTRIL) 5 MG tablet Take 5 mg by mouth daily.    ? magnesium  oxide (MAG-OX) 400 MG tablet Take 400 mg by mouth daily.    ? metoprolol tartrate (LOPRESSOR) 25 MG tablet Take 1.5 tablets (37.5 mg total) by mouth every morning. & 25 mg in the evening 135 tablet 2  ? ondansetron (ZOFRAN) 4 MG tablet Take 1 tablet (4 mg total) by mouth every 8 (eight) hours as needed for nausea or vomiting. 30 tablet 1  ? OXYGEN Inhale 3 L into the lungs continuous.    ? pantoprazole (PROTONIX) 40 MG tablet Take 1 tablet (40 mg total) by mouth 2 (two) times daily. 60 tablet 2  ? rifaximin (XIFAXAN) 550 MG TABS tablet Take 550 mg by mouth 2 (two) times daily.    ? simvastatin (ZOCOR)  40 MG tablet Take 40 mg by mouth at bedtime.     ? magnesium oxide (MAG-OX) 400 (240 Mg) MG tablet Take 1 tablet by mouth daily.    ? ? ?Results for orders placed or performed during the hospital encounter of 06/16/21 (from the past 48 hour(s))  ?Glucose, capillary     Status: None  ? Collection Time: 06/16/21  7:48 AM  ?Result Value Ref Range  ? Glucose-Capillary 83 70 - 99 mg/dL  ?  Comment: Glucose reference range applies only to samples taken after fasting for at least 8 hours.  ? ?No results found. ? ?Review of Systems ? ?Blood pressure 108/81, pulse 95, temperature 97.6 ?F (36.4 ?C), temperature source Oral, resp. rate 14, height 5' 8"  (1.727 m), weight 81.6 kg, SpO2 100 %. ?Physical Exam ?Constitutional:   ?   Comments: Patient does not have rhexis.  ?HENT:  ?   Mouth/Throat:  ?   Mouth: Mucous membranes are moist.  ?   Pharynx: Oropharynx is clear.  ?Eyes:  ?   Conjunctiva/sclera: Conjunctivae normal.  ?Cardiovascular:  ?   Rate and Rhythm: Rhythm irregular.  ?   Heart sounds: Normal heart sounds. No murmur heard. ?Pulmonary:  ?   Effort: Pulmonary effort is normal.  ?   Breath sounds: Normal breath sounds.  ?Abdominal:  ?   Comments: Thank you upper midline scar.  Abdomen is soft and nontender with organomegaly or masses.  ?Musculoskeletal:     ?   General: No swelling.  ?   Cervical back: Neck supple.   ?Lymphadenopathy:  ?   Cervical: No cervical adenopathy.  ?Skin: ?   General: Skin is warm and dry.  ?Neurological:  ?   Mental Status: He is alert.  ?  ? ?Assessment/Plan ? ?History of gastric ulcer. ?[place]

## 2021-06-17 LAB — GLUCOSE, CAPILLARY: Glucose-Capillary: 90 mg/dL (ref 70–99)

## 2021-06-17 NOTE — Telephone Encounter (Signed)
Mrs Bracewell is calling stating that  Mr Tyler Farmer has went from 69 to 10 he has a follow up appointment with [facility] on Monday 06/21/21  ?

## 2021-06-21 ENCOUNTER — Encounter (HOSPITAL_COMMUNITY): Payer: Self-pay | Admitting: Internal Medicine

## 2021-06-21 ENCOUNTER — Ambulatory Visit (INDEPENDENT_AMBULATORY_CARE_PROVIDER_SITE_OTHER): Payer: Medicare Other | Admitting: Gastroenterology

## 2021-06-28 ENCOUNTER — Telehealth (INDEPENDENT_AMBULATORY_CARE_PROVIDER_SITE_OTHER): Payer: Self-pay

## 2021-06-28 ENCOUNTER — Other Ambulatory Visit (INDEPENDENT_AMBULATORY_CARE_PROVIDER_SITE_OTHER): Payer: Self-pay

## 2021-06-28 DIAGNOSIS — R6881 Early satiety: Secondary | ICD-10-CM

## 2021-06-28 DIAGNOSIS — K7682 Hepatic encephalopathy: Secondary | ICD-10-CM

## 2021-06-28 MED ORDER — LACTULOSE 10 GM/15ML PO SOLN: 30.0000 g | Freq: Three times a day (TID) | ORAL | 5 refills | Status: AC

## 2021-06-28 NOTE — Telephone Encounter (Signed)
Patient wife called states [facility] will not give rx for Lactulose due to the rx having two different directions on it. One says take Lactulose Twice per day the other says to take three times per day. Per last office note by Dr. Laural Golden patient wife was giving three times per day. I spoke with Dr. Jenetta Downer whom says we can send in the three times per day prescription but let patient wife know she needs to titrate to where patient is having between 2-3 bowel movements per day. Patient wife Inez Catalina aware we have sent in the prescription to [facility] as she requested and aware to titrate dose with a goal of 2-3 bowel movements per day.  ?

## 2021-07-02 ENCOUNTER — Encounter (HOSPITAL_COMMUNITY)
Admission: RE | Admit: 2021-07-02 | Discharge: 2021-07-02 | Disposition: A | Discharge status: Home / Self Care | Payer: Medicare Other | Source: Ambulatory Visit | Attending: Internal Medicine | Admitting: Internal Medicine

## 2021-07-02 ENCOUNTER — Encounter (HOSPITAL_COMMUNITY): Payer: Self-pay

## 2021-07-02 DIAGNOSIS — K311 Adult hypertrophic pyloric stenosis: Secondary | ICD-10-CM | POA: Diagnosis not present

## 2021-07-02 DIAGNOSIS — K3184 Gastroparesis: Secondary | ICD-10-CM | POA: Diagnosis not present

## 2021-07-02 DIAGNOSIS — R6881 Early satiety: Secondary | ICD-10-CM | POA: Diagnosis not present

## 2021-07-02 MED ORDER — TECHNETIUM TC 99M SULFUR COLLOID
2.0000 | Freq: Once | INTRAVENOUS | Status: AC | PRN
  Administered 2021-07-02: 2.2 via INTRAVENOUS

## 2021-07-06 ENCOUNTER — Other Ambulatory Visit (INDEPENDENT_AMBULATORY_CARE_PROVIDER_SITE_OTHER): Payer: Self-pay | Admitting: *Deleted

## 2021-07-06 ENCOUNTER — Other Ambulatory Visit (INDEPENDENT_AMBULATORY_CARE_PROVIDER_SITE_OTHER): Payer: Self-pay | Admitting: Internal Medicine

## 2021-07-06 DIAGNOSIS — D508 Other iron deficiency anemias: Secondary | ICD-10-CM

## 2021-07-06 DIAGNOSIS — K7682 Hepatic encephalopathy: Secondary | ICD-10-CM

## 2021-07-06 DIAGNOSIS — Z1321 Encounter for screening for nutritional disorder: Secondary | ICD-10-CM

## 2021-07-06 DIAGNOSIS — K7460 Unspecified cirrhosis of liver: Secondary | ICD-10-CM

## 2021-07-06 MED ORDER — METOCLOPRAMIDE HCL 5 MG PO TABS: 5.0000 mg | ORAL_TABLET | Freq: Three times a day (TID) | ORAL | 1 refills | Status: DC

## 2021-07-08 ENCOUNTER — Other Ambulatory Visit: Payer: Self-pay | Admitting: Interventional Radiology

## 2021-07-08 DIAGNOSIS — I8500 Esophageal varices without bleeding: Secondary | ICD-10-CM

## 2021-07-09 ENCOUNTER — Other Ambulatory Visit (INDEPENDENT_AMBULATORY_CARE_PROVIDER_SITE_OTHER): Payer: Self-pay | Admitting: *Deleted

## 2021-07-09 DIAGNOSIS — K7460 Unspecified cirrhosis of liver: Secondary | ICD-10-CM

## 2021-07-09 DIAGNOSIS — K7682 Hepatic encephalopathy: Secondary | ICD-10-CM

## 2021-07-13 DIAGNOSIS — K7682 Hepatic encephalopathy: Secondary | ICD-10-CM | POA: Diagnosis not present

## 2021-07-13 DIAGNOSIS — I10 Essential (primary) hypertension: Secondary | ICD-10-CM | POA: Diagnosis not present

## 2021-07-13 DIAGNOSIS — Z13220 Encounter for screening for lipoid disorders: Secondary | ICD-10-CM | POA: Diagnosis not present

## 2021-07-13 DIAGNOSIS — R5383 Other fatigue: Secondary | ICD-10-CM | POA: Diagnosis not present

## 2021-07-13 DIAGNOSIS — K7460 Unspecified cirrhosis of liver: Secondary | ICD-10-CM | POA: Diagnosis not present

## 2021-07-13 DIAGNOSIS — E11628 Type 2 diabetes mellitus with other skin complications: Secondary | ICD-10-CM | POA: Diagnosis not present

## 2021-07-16 DIAGNOSIS — I129 Hypertensive chronic kidney disease with stage 1 through stage 4 chronic kidney disease, or unspecified chronic kidney disease: Secondary | ICD-10-CM | POA: Diagnosis not present

## 2021-07-16 DIAGNOSIS — I10 Essential (primary) hypertension: Secondary | ICD-10-CM | POA: Diagnosis not present

## 2021-07-16 DIAGNOSIS — I509 Heart failure, unspecified: Secondary | ICD-10-CM | POA: Diagnosis not present

## 2021-07-16 DIAGNOSIS — K432 Incisional hernia without obstruction or gangrene: Secondary | ICD-10-CM | POA: Diagnosis not present

## 2021-07-16 DIAGNOSIS — E1165 Type 2 diabetes mellitus with hyperglycemia: Secondary | ICD-10-CM | POA: Diagnosis not present

## 2021-07-16 DIAGNOSIS — D500 Iron deficiency anemia secondary to blood loss (chronic): Secondary | ICD-10-CM | POA: Diagnosis not present

## 2021-07-16 DIAGNOSIS — E1129 Type 2 diabetes mellitus with other diabetic kidney complication: Secondary | ICD-10-CM | POA: Diagnosis not present

## 2021-07-16 DIAGNOSIS — E1122 Type 2 diabetes mellitus with diabetic chronic kidney disease: Secondary | ICD-10-CM | POA: Diagnosis not present

## 2021-07-26 ENCOUNTER — Other Ambulatory Visit (INDEPENDENT_AMBULATORY_CARE_PROVIDER_SITE_OTHER): Payer: Self-pay | Admitting: *Deleted

## 2021-07-26 ENCOUNTER — Telehealth (INDEPENDENT_AMBULATORY_CARE_PROVIDER_SITE_OTHER): Payer: Self-pay | Admitting: *Deleted

## 2021-07-26 DIAGNOSIS — K7460 Unspecified cirrhosis of liver: Secondary | ICD-10-CM

## 2021-07-26 NOTE — Telephone Encounter (Signed)
Weight was 200lbs when started giving lasix and dropped by 2 lbs back up to 200 lbs today.  ?Per dr Laural Golden give lasix 29m every morning and do met 7 this week.  ?

## 2021-07-26 NOTE — Telephone Encounter (Signed)
Seen 3/21. Note states to notify if weight gain of 7 lbs or more. Lasix and spironolactone was stopped at that visit.  ?Wife Inez Catalina called states O2 stats were in the mid 80's this weekend. Gets real sob when walking. Patient declined to go to hospital over weekend per wife. No swelling in abdomen or legs. Wife states he usually weighs around 190. States his weight is 200 lbs today. She has been giving him lasix 72m one bid for the past 3 days. On 3 liters of O2 but patient bumped it up to 3.5 liters. I asked her to check O2 stat while I was on the phone with her and it was 88 -91. States he gets sob when walking.  ? ?[phone]  ?

## 2021-07-26 NOTE — Telephone Encounter (Signed)
Wife was notified to do lasix 26m daily and met 7 this week. She states she will go today and advised if sob is worse or O2 in the 80's should go to ED. She verbalized understanding.  ?

## 2021-07-27 ENCOUNTER — Ambulatory Visit (INDEPENDENT_AMBULATORY_CARE_PROVIDER_SITE_OTHER): Payer: Medicare Other | Admitting: Internal Medicine

## 2021-07-27 DIAGNOSIS — K7581 Nonalcoholic steatohepatitis (NASH): Secondary | ICD-10-CM | POA: Diagnosis not present

## 2021-07-27 DIAGNOSIS — D508 Other iron deficiency anemias: Secondary | ICD-10-CM | POA: Diagnosis not present

## 2021-07-27 DIAGNOSIS — K7682 Hepatic encephalopathy: Secondary | ICD-10-CM | POA: Diagnosis not present

## 2021-07-27 DIAGNOSIS — Z1321 Encounter for screening for nutritional disorder: Secondary | ICD-10-CM | POA: Diagnosis not present

## 2021-07-27 DIAGNOSIS — K7460 Unspecified cirrhosis of liver: Secondary | ICD-10-CM | POA: Diagnosis not present

## 2021-07-28 LAB — BASIC METABOLIC PANEL
BUN/Creatinine Ratio: 11 (calc) (ref 6–22)
BUN: 15 mg/dL (ref 7–25)
CO2: 29 mmol/L (ref 20–32)
Calcium: 8.3 mg/dL — ABNORMAL LOW (ref 8.6–10.3)
Chloride: 103 mmol/L (ref 98–110)
Creat: 1.37 mg/dL — ABNORMAL HIGH (ref 0.70–1.22)
Glucose, Bld: 108 mg/dL (ref 65–139)
Potassium: 4.3 mmol/L (ref 3.5–5.3)
Sodium: 141 mmol/L (ref 135–146)

## 2021-07-28 LAB — MAGNESIUM: Magnesium: 1.7 mg/dL (ref 1.5–2.5)

## 2021-08-02 ENCOUNTER — Encounter (INDEPENDENT_AMBULATORY_CARE_PROVIDER_SITE_OTHER): Payer: Self-pay | Admitting: *Deleted

## 2021-08-02 ENCOUNTER — Other Ambulatory Visit: Payer: Self-pay

## 2021-08-02 ENCOUNTER — Emergency Department (HOSPITAL_COMMUNITY): Payer: Medicare Other

## 2021-08-02 ENCOUNTER — Inpatient Hospital Stay (HOSPITAL_COMMUNITY)
Admission: EM | Admit: 2021-08-02 | Discharge: 2021-08-06 | DRG: 291 | Disposition: A | Discharge status: Home / Self Care | Payer: Medicare Other | Attending: Family Medicine | Admitting: Family Medicine

## 2021-08-02 ENCOUNTER — Encounter (HOSPITAL_COMMUNITY): Payer: Self-pay

## 2021-08-02 DIAGNOSIS — Z7982 Long term (current) use of aspirin: Secondary | ICD-10-CM | POA: Diagnosis not present

## 2021-08-02 DIAGNOSIS — Z825 Family history of asthma and other chronic lower respiratory diseases: Secondary | ICD-10-CM

## 2021-08-02 DIAGNOSIS — E7800 Pure hypercholesterolemia, unspecified: Secondary | ICD-10-CM | POA: Diagnosis present

## 2021-08-02 DIAGNOSIS — Z79890 Hormone replacement therapy (postmenopausal): Secondary | ICD-10-CM

## 2021-08-02 DIAGNOSIS — J439 Emphysema, unspecified: Secondary | ICD-10-CM | POA: Diagnosis not present

## 2021-08-02 DIAGNOSIS — Z8249 Family history of ischemic heart disease and other diseases of the circulatory system: Secondary | ICD-10-CM | POA: Diagnosis not present

## 2021-08-02 DIAGNOSIS — N1831 Chronic kidney disease, stage 3a: Secondary | ICD-10-CM | POA: Diagnosis not present

## 2021-08-02 DIAGNOSIS — E119 Type 2 diabetes mellitus without complications: Secondary | ICD-10-CM

## 2021-08-02 DIAGNOSIS — J811 Chronic pulmonary edema: Secondary | ICD-10-CM | POA: Diagnosis not present

## 2021-08-02 DIAGNOSIS — J9601 Acute respiratory failure with hypoxia: Secondary | ICD-10-CM | POA: Diagnosis present

## 2021-08-02 DIAGNOSIS — J189 Pneumonia, unspecified organism: Secondary | ICD-10-CM | POA: Diagnosis present

## 2021-08-02 DIAGNOSIS — Z9889 Other specified postprocedural states: Secondary | ICD-10-CM

## 2021-08-02 DIAGNOSIS — I130 Hypertensive heart and chronic kidney disease with heart failure and stage 1 through stage 4 chronic kidney disease, or unspecified chronic kidney disease: Secondary | ICD-10-CM | POA: Diagnosis not present

## 2021-08-02 DIAGNOSIS — J90 Pleural effusion, not elsewhere classified: Secondary | ICD-10-CM | POA: Diagnosis present

## 2021-08-02 DIAGNOSIS — Z823 Family history of stroke: Secondary | ICD-10-CM | POA: Diagnosis not present

## 2021-08-02 DIAGNOSIS — K7581 Nonalcoholic steatohepatitis (NASH): Secondary | ICD-10-CM | POA: Diagnosis present

## 2021-08-02 DIAGNOSIS — E538 Deficiency of other specified B group vitamins: Secondary | ICD-10-CM | POA: Diagnosis present

## 2021-08-02 DIAGNOSIS — R0602 Shortness of breath: Secondary | ICD-10-CM | POA: Diagnosis not present

## 2021-08-02 DIAGNOSIS — I5031 Acute diastolic (congestive) heart failure: Secondary | ICD-10-CM | POA: Diagnosis not present

## 2021-08-02 DIAGNOSIS — K766 Portal hypertension: Secondary | ICD-10-CM | POA: Diagnosis not present

## 2021-08-02 DIAGNOSIS — N179 Acute kidney failure, unspecified: Secondary | ICD-10-CM | POA: Diagnosis not present

## 2021-08-02 DIAGNOSIS — I482 Chronic atrial fibrillation, unspecified: Secondary | ICD-10-CM | POA: Diagnosis not present

## 2021-08-02 DIAGNOSIS — Z79899 Other long term (current) drug therapy: Secondary | ICD-10-CM | POA: Diagnosis not present

## 2021-08-02 DIAGNOSIS — Z880 Allergy status to penicillin: Secondary | ICD-10-CM | POA: Diagnosis not present

## 2021-08-02 DIAGNOSIS — E1122 Type 2 diabetes mellitus with diabetic chronic kidney disease: Secondary | ICD-10-CM | POA: Diagnosis present

## 2021-08-02 DIAGNOSIS — Z8546 Personal history of malignant neoplasm of prostate: Secondary | ICD-10-CM

## 2021-08-02 DIAGNOSIS — Z20822 Contact with and (suspected) exposure to covid-19: Secondary | ICD-10-CM | POA: Diagnosis not present

## 2021-08-02 DIAGNOSIS — I509 Heart failure, unspecified: Secondary | ICD-10-CM | POA: Diagnosis not present

## 2021-08-02 DIAGNOSIS — K219 Gastro-esophageal reflux disease without esophagitis: Secondary | ICD-10-CM | POA: Diagnosis present

## 2021-08-02 DIAGNOSIS — R846 Abnormal cytological findings in specimens from respiratory organs and thorax: Secondary | ICD-10-CM | POA: Diagnosis not present

## 2021-08-02 DIAGNOSIS — N281 Cyst of kidney, acquired: Secondary | ICD-10-CM | POA: Diagnosis not present

## 2021-08-02 DIAGNOSIS — I5033 Acute on chronic diastolic (congestive) heart failure: Secondary | ICD-10-CM | POA: Diagnosis present

## 2021-08-02 DIAGNOSIS — J9621 Acute and chronic respiratory failure with hypoxia: Secondary | ICD-10-CM | POA: Diagnosis present

## 2021-08-02 DIAGNOSIS — K7460 Unspecified cirrhosis of liver: Secondary | ICD-10-CM | POA: Diagnosis not present

## 2021-08-02 DIAGNOSIS — I10 Essential (primary) hypertension: Secondary | ICD-10-CM | POA: Diagnosis present

## 2021-08-02 DIAGNOSIS — Z833 Family history of diabetes mellitus: Secondary | ICD-10-CM

## 2021-08-02 DIAGNOSIS — Z87891 Personal history of nicotine dependence: Secondary | ICD-10-CM | POA: Diagnosis not present

## 2021-08-02 DIAGNOSIS — R059 Cough, unspecified: Secondary | ICD-10-CM | POA: Diagnosis not present

## 2021-08-02 DIAGNOSIS — I4891 Unspecified atrial fibrillation: Secondary | ICD-10-CM | POA: Diagnosis not present

## 2021-08-02 LAB — CBC WITH DIFFERENTIAL/PLATELET
Abs Immature Granulocytes: 0.01 10*3/uL (ref 0.00–0.07)
Basophils Absolute: 0.1 10*3/uL (ref 0.0–0.1)
Basophils Relative: 1 %
Eosinophils Absolute: 0.4 10*3/uL (ref 0.0–0.5)
Eosinophils Relative: 6 %
HCT: 38.5 % — ABNORMAL LOW (ref 39.0–52.0)
Hemoglobin: 12.7 g/dL — ABNORMAL LOW (ref 13.0–17.0)
Immature Granulocytes: 0 %
Lymphocytes Relative: 15 %
Lymphs Abs: 0.9 10*3/uL (ref 0.7–4.0)
MCH: 31.8 pg (ref 26.0–34.0)
MCHC: 33 g/dL (ref 30.0–36.0)
MCV: 96.3 fL (ref 80.0–100.0)
Monocytes Absolute: 0.8 10*3/uL (ref 0.1–1.0)
Monocytes Relative: 13 %
Neutro Abs: 4 10*3/uL (ref 1.7–7.7)
Neutrophils Relative %: 65 %
Platelets: 161 10*3/uL (ref 150–400)
RBC: 4 MIL/uL — ABNORMAL LOW (ref 4.22–5.81)
RDW: 17.6 % — ABNORMAL HIGH (ref 11.5–15.5)
WBC: 6.2 10*3/uL (ref 4.0–10.5)
nRBC: 0 % (ref 0.0–0.2)

## 2021-08-02 LAB — COMPREHENSIVE METABOLIC PANEL
ALT: 22 U/L (ref 0–44)
AST: 33 U/L (ref 15–41)
Albumin: 2.6 g/dL — ABNORMAL LOW (ref 3.5–5.0)
Alkaline Phosphatase: 190 U/L — ABNORMAL HIGH (ref 38–126)
Anion gap: 5 (ref 5–15)
BUN: <5 mg/dL — ABNORMAL LOW (ref 8–23)
CO2: 29 mmol/L (ref 22–32)
Calcium: 8.5 mg/dL — ABNORMAL LOW (ref 8.9–10.3)
Chloride: 105 mmol/L (ref 98–111)
Creatinine, Ser: 1.29 mg/dL — ABNORMAL HIGH (ref 0.61–1.24)
GFR, Estimated: 56 mL/min — ABNORMAL LOW (ref >60)
Glucose, Bld: 159 mg/dL — ABNORMAL HIGH (ref 70–99)
Potassium: 3.6 mmol/L (ref 3.5–5.1)
Sodium: 139 mmol/L (ref 135–145)
Total Bilirubin: 2.1 mg/dL — ABNORMAL HIGH (ref 0.3–1.2)
Total Protein: 6.6 g/dL (ref 6.5–8.1)

## 2021-08-02 LAB — TROPONIN I (HIGH SENSITIVITY)
Troponin I (High Sensitivity): 12 ng/L (ref <18)
Troponin I (High Sensitivity): 11 ng/L (ref <18)

## 2021-08-02 LAB — RESP PANEL BY RT-PCR (FLU A&B, COVID) ARPGX2
Influenza A by PCR: NEGATIVE
Influenza B by PCR: NEGATIVE
SARS Coronavirus 2 by RT PCR: NEGATIVE

## 2021-08-02 LAB — PROCALCITONIN: Procalcitonin: <0.1 ng/mL

## 2021-08-02 LAB — BRAIN NATRIURETIC PEPTIDE: B Natriuretic Peptide: 405 pg/mL — ABNORMAL HIGH (ref 0.0–100.0)

## 2021-08-02 MED ORDER — FUROSEMIDE 10 MG/ML IJ SOLN
40.0000 mg | Freq: Once | INTRAMUSCULAR | Status: AC
  Administered 2021-08-02: 40 mg via INTRAVENOUS
  Filled 2021-08-02: qty 4

## 2021-08-02 MED ORDER — LACTULOSE 10 GM/15ML PO SOLN
30.0000 g | Freq: Three times a day (TID) | ORAL | Status: DC
  Administered 2021-08-02 – 2021-08-06 (×12): 30 g via ORAL
  Filled 2021-08-02 (×12): qty 60

## 2021-08-02 MED ORDER — FUROSEMIDE 10 MG/ML IJ SOLN
40.0000 mg | Freq: Two times a day (BID) | INTRAMUSCULAR | Status: DC
  Administered 2021-08-03 – 2021-08-05 (×5): 40 mg via INTRAVENOUS
  Filled 2021-08-02 (×5): qty 4

## 2021-08-02 MED ORDER — POTASSIUM CHLORIDE CRYS ER 20 MEQ PO TBCR
40.0000 meq | EXTENDED_RELEASE_TABLET | Freq: Once | ORAL | Status: AC
  Administered 2021-08-02: 40 meq via ORAL
  Filled 2021-08-02: qty 2

## 2021-08-02 MED ORDER — METOPROLOL TARTRATE 25 MG PO TABS
37.5000 mg | ORAL_TABLET | Freq: Every day | ORAL | Status: DC
  Administered 2021-08-03 – 2021-08-05 (×3): 37.5 mg via ORAL
  Filled 2021-08-02 (×4): qty 2

## 2021-08-02 MED ORDER — ASPIRIN EC 81 MG PO TBEC
81.0000 mg | DELAYED_RELEASE_TABLET | Freq: Every day | ORAL | Status: DC
  Administered 2021-08-03 – 2021-08-06 (×4): 81 mg via ORAL
  Filled 2021-08-02 (×4): qty 1

## 2021-08-02 MED ORDER — ALBUTEROL SULFATE HFA 108 (90 BASE) MCG/ACT IN AERS
2.0000 | INHALATION_SPRAY | RESPIRATORY_TRACT | Status: DC | PRN
Start: 2021-08-02 — End: 2021-08-02

## 2021-08-02 MED ORDER — LEVOTHYROXINE SODIUM 137 MCG PO TABS
137.0000 ug | ORAL_TABLET | Freq: Every day | ORAL | Status: DC
  Administered 2021-08-03 – 2021-08-06 (×4): 137 ug via ORAL
  Filled 2021-08-02 (×4): qty 1

## 2021-08-02 MED ORDER — SODIUM CHLORIDE 0.9 % IV SOLN: Freq: Once | INTRAVENOUS | Status: AC

## 2021-08-02 MED ORDER — IOHEXOL 350 MG/ML SOLN
100.0000 mL | Freq: Once | INTRAVENOUS | Status: AC | PRN
  Administered 2021-08-02: 75 mL via INTRAVENOUS

## 2021-08-02 MED ORDER — LEVOFLOXACIN IN D5W 750 MG/150ML IV SOLN
750.0000 mg | Freq: Once | INTRAVENOUS | Status: AC
  Administered 2021-08-02: 750 mg via INTRAVENOUS
  Filled 2021-08-02: qty 150

## 2021-08-02 MED ORDER — SPIRONOLACTONE 12.5 MG HALF TABLET
12.5000 mg | ORAL_TABLET | Freq: Two times a day (BID) | ORAL | Status: DC
  Administered 2021-08-03 – 2021-08-05 (×7): 12.5 mg via ORAL
  Filled 2021-08-02 (×14): qty 1

## 2021-08-02 MED ORDER — METOPROLOL TARTRATE 25 MG PO TABS
25.0000 mg | ORAL_TABLET | Freq: Every day | ORAL | Status: DC
  Administered 2021-08-02 – 2021-08-05 (×4): 25 mg via ORAL
  Filled 2021-08-02 (×4): qty 1

## 2021-08-02 MED ORDER — ENOXAPARIN SODIUM 40 MG/0.4ML IJ SOSY: 40.0000 mg | PREFILLED_SYRINGE | INTRAMUSCULAR | Status: DC

## 2021-08-02 MED ORDER — METOCLOPRAMIDE HCL 10 MG PO TABS
5.0000 mg | ORAL_TABLET | Freq: Three times a day (TID) | ORAL | Status: DC
  Administered 2021-08-03 – 2021-08-06 (×12): 5 mg via ORAL
  Filled 2021-08-02 (×12): qty 1

## 2021-08-02 MED ORDER — RIFAXIMIN 550 MG PO TABS
550.0000 mg | ORAL_TABLET | Freq: Two times a day (BID) | ORAL | Status: DC
  Administered 2021-08-02 – 2021-08-06 (×8): 550 mg via ORAL
  Filled 2021-08-02 (×8): qty 1

## 2021-08-02 MED ORDER — PANTOPRAZOLE SODIUM 40 MG PO TBEC
40.0000 mg | DELAYED_RELEASE_TABLET | Freq: Two times a day (BID) | ORAL | Status: DC
  Administered 2021-08-02 – 2021-08-06 (×8): 40 mg via ORAL
  Filled 2021-08-02 (×8): qty 1

## 2021-08-02 MED ORDER — POLYETHYLENE GLYCOL 3350 17 G PO PACK: 17.0000 g | PACK | Freq: Every day | ORAL | Status: DC | PRN

## 2021-08-02 NOTE — Assessment & Plan Note (Addendum)
-  Stable. Monitoring.  ?-IV Lasix 40 twice daily ?-Continue metoprolol  25-37.5 twice daily (added holding parameters) ?

## 2021-08-02 NOTE — Assessment & Plan Note (Addendum)
-   Monitor fasting blood glucose  ?-Currently not on medication. ?- HgbA1c ? ?CBG (last 3)  ?Recent Labs  ?  08/03/21 ?0634 08/03/21 ?0730  ?GLUCAP 142* 120*  ? ? ?

## 2021-08-02 NOTE — ED Provider Notes (Signed)
?[name] ?Provider Note ? ? ?CSN: [id number] ?Arrival date & time: 08/02/21  1110 ? ?  ? ?History ? ?Chief Complaint  ?Patient presents with  ? Shortness of Breath  ? ? ?Tyler Farmer is a 81 y.o. male. ? ?HPI ? ?81 year old male with past medical history of HTN, HLD, DM, atrial fibrillation not on anticoagulation, AKI, cirrhosis presents the emergency department with exertional shortness of breath.  Patient states over the past 2 days he has had a difficult time even walking up his hallway.  He gets very winded which is resolved with rest after about a minute.  They called her primary doctor who placed him on Lasix this week.  He has had increased urine production with only minimal improvement of symptoms.  Denies any significant swelling of his abdomen/legs.  No recent fever, productive cough. ? ?Home Medications ?Prior to Admission medications   ?Medication Sig Start Date End Date Taking? Authorizing Provider  ?acetaminophen (TYLENOL) 500 MG tablet Take 500 mg by mouth every 6 (six) hours as needed for mild pain.   Yes [provider]  ?aspirin EC 81 MG tablet Take 1 tablet (81 mg total) by mouth daily with breakfast. Swallow whole. 02/21/21  Yes Emokpae, Courage, MD  ?Cranberry 500 MG TABS Take 500 mg by mouth daily.   Yes [provider]  ?ferrous sulfate 325 (65 FE) MG tablet Take 325 mg by mouth daily with breakfast.   Yes [provider]  ?furosemide (LASIX) 40 MG tablet Take 40 mg by mouth 2 (two) times daily.   Yes [provider]  ?glipiZIDE (GLUCOTROL) 10 MG tablet Take 10 mg by mouth 2 (two) times daily before a meal.   Yes [provider]  ?guaiFENesin (MUCINEX) 600 MG 12 hr tablet Take 600 mg by mouth 2 (two) times daily as needed for cough or to loosen phlegm.   Yes [provider]  ?lactulose (CHRONULAC) 10 GM/15ML solution Take 45 mLs (30 g total) by mouth 3 (three) times daily. 06/28/21  Yes Farmer, Tyler Dawley, MD   ?levothyroxine (SYNTHROID) 137 MCG tablet Take 137 mcg by mouth daily before breakfast.   Yes [provider]  ?magnesium oxide (MAG-OX) 400 MG tablet Take 400 mg by mouth daily.   Yes [provider]  ?metoCLOPramide (REGLAN) 5 MG tablet Take 1 tablet (5 mg total) by mouth 3 (three) times daily before meals. 07/06/21  Yes Farmer, Tyler Dawley, MD  ?metoprolol tartrate (LOPRESSOR) 25 MG tablet Take 1.5 tablets (37.5 mg total) by mouth every morning. & 25 mg in the evening ?Patient taking differently: Take 25-37.5 mg by mouth every morning. 37.59m in the morning & 25 mg in the evening 04/27/21  Yes LImogene Burn PA-C  ?ondansetron (ZOFRAN) 4 MG tablet Take 1 tablet (4 mg total) by mouth every 8 (eight) hours as needed for nausea or vomiting. 03/10/21  Yes CMontez Morita DQuillian Quince MD  ?OXYGEN Inhale 3 L into the lungs continuous.   Yes [provider]  ?pantoprazole (PROTONIX) 40 MG tablet Take 1 tablet (40 mg total) by mouth 2 (two) times daily. 05/19/21 08/02/21 Yes Farmer, NMechele Dawley MD  ?potassium chloride (KLOR-CON) 10 MEQ tablet Take 10 mEq by mouth daily. 07/16/21  Yes [provider]  ?rifaximin (XIFAXAN) 550 MG TABS tablet Take 550 mg by mouth 2 (two) times daily.   Yes [provider]  ?simvastatin (ZOCOR) 40 MG tablet Take 40 mg by mouth at bedtime.  Yes [provider]  ?   ? ?Allergies    ?Penicillins   ? ?Review of Systems   ?Review of Systems  ?Constitutional:  Positive for fatigue. Negative for fever.  ?Respiratory:  Positive for shortness of breath. Negative for cough, chest tightness and wheezing.   ?Cardiovascular:  Negative for chest pain, palpitations and leg swelling.  ?Gastrointestinal:  Negative for abdominal pain, diarrhea and vomiting.  ?Genitourinary:  Negative for flank pain.  ?Musculoskeletal:  Negative for back pain.  ?Skin:  Negative for rash.  ?Neurological:  Negative for light-headedness and headaches.  ? ?Physical Exam ?Updated Vital  Signs ?BP 96/83   Pulse (!) 102   Temp 97.8 ?F (36.6 ?C) (Oral)   Resp (!) 25   Ht 5' 9"  (1.753 m)   Wt 93 kg   SpO2 98%   BMI 30.27 kg/m?  ?Physical Exam ?Vitals and nursing note reviewed.  ?Constitutional:   ?   General: He is not in acute distress. ?   Appearance: Normal appearance. He is not diaphoretic.  ?HENT:  ?   Head: Normocephalic.  ?   Mouth/Throat:  ?   Mouth: Mucous membranes are moist.  ?Cardiovascular:  ?   Rate and Rhythm: Tachycardia present.  ?Pulmonary:  ?   Effort: Pulmonary effort is normal. Tachypnea present. No respiratory distress.  ?   Breath sounds: Examination of the left-middle field reveals rales. Examination of the right-lower field reveals decreased breath sounds and rales. Examination of the left-lower field reveals decreased breath sounds and rales. Decreased breath sounds and rales present.  ?Abdominal:  ?   Palpations: Abdomen is soft.  ?   Tenderness: There is no abdominal tenderness.  ?Musculoskeletal:  ?   Right lower leg: No edema.  ?   Left lower leg: No edema.  ?Skin: ?   General: Skin is warm.  ?Neurological:  ?   Mental Status: He is alert and oriented to person, place, and time. Mental status is at baseline.  ?Psychiatric:     ?   Mood and Affect: Mood normal.  ? ? ?ED Results / Procedures / Treatments   ?Labs ?(all labs ordered are listed, but only abnormal results are displayed) ?Labs Reviewed  ?CBC WITH DIFFERENTIAL/PLATELET - Abnormal; Notable for the following components:  ?    Result Value  ? RBC 4.00 (*)   ? Hemoglobin 12.7 (*)   ? HCT 38.5 (*)   ? RDW 17.6 (*)   ? All other components within normal limits  ?RESP PANEL BY RT-PCR (FLU A&B, COVID) ARPGX2  ?COMPREHENSIVE METABOLIC PANEL  ?BRAIN NATRIURETIC PEPTIDE  ?TROPONIN I (HIGH SENSITIVITY)  ? ? ?EKG ?EKG Interpretation ? ?Date/Time:  Monday Aug 02 2021 12:22:01 EDT ?Ventricular Rate:  103 ?PR Interval:    ?QRS Duration: 76 ?QT Interval:  346 ?QTC Calculation: 453 ?R Axis:   74 ?Text Interpretation: Atrial  fibrillation with rapid ventricular response Low voltage QRS Nonspecific ST and T wave abnormality Abnormal ECG When compared with ECG of 28-Apr-2021 11:27, PREVIOUS ECG IS PRESENT Confirmed by Lavenia Atlas [phone]) on 08/02/2021 3:16:53 PM ? ?Radiology ?DG Chest Port 1 View ? ?Result Date: 08/02/2021 ?CLINICAL DATA:  Shortness of breath for a week. EXAM: PORTABLE CHEST 1 VIEW COMPARISON:  04/29/2021 and CT chest 04/12/2021. FINDINGS: Trachea is midline. Heart is enlarged. Thoracic aorta is calcified. Large left pleural effusion. Interstitial prominence and indistinctness bilaterally. Right perihilar nodule measures 11 mm, not well seen on the comparison exam. Moderate right pleural  effusion with bibasilar collapse/consolidation. IMPRESSION: 1. Congestive heart failure. 2. New nodule in the right perihilar region. Consider nonemergent/outpatient CT chest without contrast in further evaluation, when current symptoms abate. 3. Bibasilar collapse/consolidation. Difficult to exclude pneumonia. Electronically Signed   By: Lorin Picket M.D.   On: 08/02/2021 13:29   ? ?Procedures ?Marland KitchenCritical Care ?Performed by: Lorelle Gibbs, DO ?Authorized by: Lorelle Gibbs, DO  ? ?Critical care provider statement:  ?  Critical care time (minutes):  45 ?  Critical care time was exclusive of:  Separately billable procedures and treating other patients ?  Critical care was necessary to treat or prevent imminent or life-threatening deterioration of the following conditions:  Respiratory failure ?  Critical care was time spent personally by me on the following activities:  Development of treatment plan with patient or surrogate, discussions with consultants, evaluation of patient's response to treatment, examination of patient, ordering and review of laboratory studies, ordering and review of radiographic studies, ordering and performing treatments and interventions, pulse oximetry, re-evaluation of patient's condition and review of  old charts ?  I assumed direction of critical care for this patient from another provider in my specialty: no   ?  Care discussed with: admitting provider    ? ? ?Medications Ordered in ED ?Medications  ?albuterol

## 2021-08-02 NOTE — Assessment & Plan Note (Addendum)
Afebrile, without leukocytosis.  WBC 6.  Denies cough.  Does not meet sepsis criteria.  CTA chest-showing consolidation left greater than right, but also shows pleural effusion that is worse on the left also.  Presentation more reflective of volume overload than pneumonia.  Possibly imaging findings of consolidation are secondary to lung collapse from large effusions. ?-Treat as pneumonia for now with Levaquin (penicillin allergy noted), low threshold to discontinue ?-procalcitonin reassuring: I wound be in favor of rapidly de-escalating and stopping antibiotics.  ? ?

## 2021-08-02 NOTE — Assessment & Plan Note (Addendum)
Currently RATED CONTROLLED atrial fibrillation.  EKG shows rate of 105.  Not on anticoagulation due to history of GI bleed. ?

## 2021-08-02 NOTE — Assessment & Plan Note (Addendum)
Volume overloaded.  Mental status intact.  Reports 3 bowel movements daily, reports compliance with lactulose 45 mils TID and Xifaxan ?-IV Lasix for aggressive diuresis ?- Continue spironolactone ?-Resume lactulose and Xifaxan ?-Spouse reports patient takes spironolactone, not currently on home medication list, will start at 12.66m twice daily. ?

## 2021-08-02 NOTE — ED Notes (Signed)
Pt wears 3L O2 at night. Pt placed on 3L O2 due to saturations dropping to 87% ?

## 2021-08-02 NOTE — ED Notes (Signed)
Patient transported to CT 

## 2021-08-02 NOTE — ED Notes (Signed)
Report called to Tiffany, RN on 300 ?

## 2021-08-02 NOTE — ED Triage Notes (Signed)
Pt c/o SOB x 1 week, pt is on RA. No n/v noted.  ?

## 2021-08-02 NOTE — ED Notes (Signed)
Attempted report times one to 300. RN to call back when she can receive report ?

## 2021-08-02 NOTE — Assessment & Plan Note (Addendum)
1+ pitting lower extremity edema from mid leg upwards towards thigh, CT chest showing bilateral large pleural effusions left greater than right, weight has increased- 205 today up from 180s -2 months ago in March.  BNP - 405, not significantly changed from prior. ?-Echo 03/2021, EF of 60 to 65% ?-IV Lasix 40 mg twice daily. ?-Strict input output, daily weights Daily BMP ?-May benefit from thoracentesis if no significant improvement ?-Spouse reports patient take spironolactone, but not currently on medication list, will start at 12.5 twice daily ?-Did not attempt ReDS clip test on him due to large curvature of his thoracic spine.  ?

## 2021-08-02 NOTE — Assessment & Plan Note (Addendum)
O2 sats down to 88% on room air currently on 3 L.  Likely secondary to large pleural effusions from decompensated CHF. ?-Supplemental O2 ?-Diuresis for now, would get thoracentesis if no significant improvement ?-follow up CXR in AM to further assess  ?

## 2021-08-02 NOTE — H&P (Signed)
?History and Physical  ? ? ?Tyler Farmer [id number]:[id number] DOB: [birth year] DOA: 08/02/2021 ? ?PCP: [name], MD  ? ?Patient coming from: Home ? ?I have personally briefly reviewed patient's old medical records in [place] ? ?Chief Complaint: Difficulty breathing. ? ?HPI: Tyler Farmer is a 81 y.o. male with history significant for multiple medical problems including atrial fibrillation, hypertension, diabetes, [name] liver cirrhosis, COPD, GI bleed, portal vein thrombosis, hepatic encephalopathy TIPs, CHF. ?Patient presented to ED with complaints of difficulty breathing of 1 week duration, especially with exertion, just walking down his hallway in his house, and would improve with rest.  Spouse is at bedside and assist with the history, she reports they have been checking his weight, and its been increasing by 1 pound every day.  He called his outpatient provider, he was initially started on Lasix 40 mg daily and increased to twice daily without significant improvement in symptoms.   ?Patient also reports tightness in his thighs and some abdominal bloating.  He denies cough.  No chest pain. ? ?ED Course: Temperature 97.8.  Heart rate 91-119.  Respiratory symptoms 17- 27.  O2 sats down to 88% placed on 3 L.  Blood pressure systolic ranging from 40-814.  O2 sats 91 to 98% on room air.  BNP 405.  Troponin 12 >> 11.  CTA chest negative for PE, shows large bilateral pleural effusion left greater than right, lower lobe consolidation left greater than right.  ?IV Lasix 40 mg x 1 given. ?IV Levaquin given. ? ?Review of Systems: As per HPI all other systems reviewed and negative. ? ?Past Medical History:  ?Diagnosis Date  ? A-fib ([place])   ? AKI (acute kidney injury) ([place]) 01/27/2021  ? Arthritis   ? Hands/Fingers  ? Asthma   ? Back pain   ? Cirrhosis of liver ([place])   ? Diabetes mellitus ([place])   ? Enlarged prostate   ? Gastric ulcer   ? GERD (gastroesophageal reflux disease)   ? H/O bladder problems   ? History  of kidney stones   ? Hypercholesteremia   ? Hypertension   ? Prostate cancer ([place])   ? Thyroid disease   ? UGI bleed 10/26/2016  ? Vitamin B12 deficiency 10/26/2016  ? ? ?Past Surgical History:  ?Procedure Laterality Date  ? CIRCUMCISION    ? at age 65  ? COLONOSCOPY N/A 11/13/2015  ? Procedure: COLONOSCOPY;  Surgeon: Rogene Houston, MD;  [facility];  Service: Endoscopy;  Laterality: N/A;  2:10  ? COLONOSCOPY WITH PROPOFOL N/A 06/17/2020  ? Procedure: COLONOSCOPY WITH PROPOFOL;  Surgeon: Rogene Houston, MD;  [facility];  Service: Endoscopy;  Laterality: N/A;  AM / patient had positive covid 05/25/20  ? ESOPHAGEAL BANDING N/A 02/13/2021  ? Procedure: ESOPHAGEAL BANDING;  Surgeon: Montez Morita, Quillian Quince, MD;  [facility];  Service: Gastroenterology;  Laterality: N/A;  ? ESOPHAGOGASTRODUODENOSCOPY N/A 06/03/2014  ? Procedure: ESOPHAGOGASTRODUODENOSCOPY (EGD);  Surgeon: Rogene Houston, MD;  [facility];  Service: Endoscopy;  Laterality: N/A;  730  ? ESOPHAGOGASTRODUODENOSCOPY N/A 10/26/2016  ? Procedure: ESOPHAGOGASTRODUODENOSCOPY (EGD);  Surgeon: Rogene Houston, MD;  [facility];  Service: Endoscopy;  Laterality: N/A;  ? ESOPHAGOGASTRODUODENOSCOPY (EGD) WITH PROPOFOL N/A 06/17/2020  ? Procedure: ESOPHAGOGASTRODUODENOSCOPY (EGD) WITH PROPOFOL;  Surgeon: Rogene Houston, MD;  [facility];  Service: Endoscopy;  Laterality: N/A;  ? ESOPHAGOGASTRODUODENOSCOPY (EGD) WITH PROPOFOL N/A 02/13/2021  ? Procedure: ESOPHAGOGASTRODUODENOSCOPY (  EGD) WITH PROPOFOL;  Surgeon: Montez Morita, Quillian Quince, MD;  [facility];  Service: Gastroenterology;  Laterality: N/A;  ? ESOPHAGOGASTRODUODENOSCOPY (EGD) WITH PROPOFOL N/A 06/16/2021  ? Procedure: ESOPHAGOGASTRODUODENOSCOPY (EGD) WITH PROPOFOL;  Surgeon: Rogene Houston, MD;  [facility];  Service: Endoscopy;  Laterality: N/A;  910 ASA 3  ? Gastric Ulcer  1993  ? GIVENS CAPSULE STUDY N/A  10/28/2016  ? Procedure: GIVENS CAPSULE STUDY;  Surgeon: Rogene Houston, MD;  [facility];  Service: Endoscopy;  Laterality: N/A;  ? HERNIA REPAIR    ? IR EMBO VENOUS NOT HEMORR HEMANG  INC GUIDE ROADMAPPING  03/05/2021  ? IR INTRAVASCULAR ULTRASOUND NON CORONARY  03/05/2021  ? IR RADIOLOGIST EVAL & MGMT  02/26/2021  ? IR RADIOLOGIST EVAL & MGMT  05/04/2021  ? IR THROMBECT VENO MECH MOD SED  03/05/2021  ? IR TIPS  03/05/2021  ? IR US GUIDE VASC ACCESS RIGHT  03/05/2021  ? IR US GUIDE VASC ACCESS RIGHT  03/05/2021  ? POLYPECTOMY  11/13/2015  ? Procedure: POLYPECTOMY;  Surgeon: Rogene Houston, MD;  [facility];  Service: Endoscopy;;  colon  ? POLYPECTOMY  06/17/2020  ? Procedure: POLYPECTOMY;  Surgeon: Rogene Houston, MD;  [facility];  Service: Endoscopy;;  sigmoid  ? RADIOLOGY WITH ANESTHESIA N/A 03/05/2021  ? Procedure: RADIOLOGY WITH ANESTHESIA    I.R. Rise Paganini;  Surgeon: Suzette Battiest, MD;  Location: [facility];  Service: Radiology;  Laterality: N/A;  ? Right Elbow Right   ? A pin was put in  ? ? ? reports that he quit smoking about 32 years ago. His smoking use included cigarettes. He quit smokeless tobacco use about 31 years ago. He reports that he does not drink alcohol and does not use drugs. ? ?Allergies  ?Allergen Reactions  ? Penicillins Nausea And Vomiting  ?  Has patient had a PCN reaction causing immediate rash, facial/tongue/throat swelling, SOB or lightheadedness with hypotension: Yes ?Has patient had a PCN reaction causing severe rash involving mucus membranes or skin necrosis: No ?Has patient had a PCN reaction that required hospitalization: No ?Has patient had a PCN reaction occurring within the last 10 years: No ?If all of the above answers are "NO", then may proceed with Cephalosporin use. ? ? ?Patient states that it also caused pain in his sides.  ? ? ?Family History  ?Problem Relation Age of Onset  ? Emphysema Mother   ? Cerebrovascular Accident Father   ? Heart  disease Father   ? Diabetes Sister   ? Heart disease Brother   ? Heart disease Sister   ? Heart disease Brother   ? Diabetes Brother   ? ? ?Prior to Admission medications   ?Medication Sig Start Date End Date Taking? Authorizing Provider  ?acetaminophen (TYLENOL) 500 MG tablet Take 500 mg by mouth every 6 (six) hours as needed for mild pain.   Yes [provider]  ?aspirin EC 81 MG tablet Take 1 tablet (81 mg total) by mouth daily with breakfast. Swallow whole. 02/21/21  Yes Amillion Macchia, Courage, MD  ?Cranberry 500 MG TABS Take 500 mg by mouth daily.   Yes [provider]  ?ferrous sulfate 325 (65 FE) MG tablet Take 325 mg by mouth daily with breakfast.   Yes [provider]  ?furosemide (LASIX) 40 MG tablet Take 40 mg by mouth 2 (two) times daily.   Yes [provider]  ?glipiZIDE (GLUCOTROL) 10 MG  tablet Take 10 mg by mouth 2 (two) times daily before a meal.   Yes [provider]  ?guaiFENesin (MUCINEX) 600 MG 12 hr tablet Take 600 mg by mouth 2 (two) times daily as needed for cough or to loosen phlegm.   Yes [provider]  ?lactulose (CHRONULAC) 10 GM/15ML solution Take 45 mLs (30 g total) by mouth 3 (three) times daily. 06/28/21  Yes Rehman, Mechele Dawley, MD  ?levothyroxine (SYNTHROID) 137 MCG tablet Take 137 mcg by mouth daily before breakfast.   Yes [provider]  ?magnesium oxide (MAG-OX) 400 MG tablet Take 400 mg by mouth daily.   Yes [provider]  ?metoCLOPramide (REGLAN) 5 MG tablet Take 1 tablet (5 mg total) by mouth 3 (three) times daily before meals. 07/06/21  Yes Rehman, Mechele Dawley, MD  ?metoprolol tartrate (LOPRESSOR) 25 MG tablet Take 1.5 tablets (37.5 mg total) by mouth every morning. & 25 mg in the evening ?Patient taking differently: Take 25-37.5 mg by mouth every morning. 37.53m in the morning & 25 mg in the evening 04/27/21  Yes LImogene Burn PA-C  ?ondansetron (ZOFRAN) 4 MG tablet Take 1 tablet (4 mg total) by mouth every 8  (eight) hours as needed for nausea or vomiting. 03/10/21  Yes CMontez Morita DQuillian Quince MD  ?OXYGEN Inhale 3 L into the lungs continuous.   Yes [provider]  ?pantoprazole (PROTONIX) 40 MG tabl

## 2021-08-03 DIAGNOSIS — J189 Pneumonia, unspecified organism: Secondary | ICD-10-CM | POA: Diagnosis not present

## 2021-08-03 DIAGNOSIS — K766 Portal hypertension: Secondary | ICD-10-CM

## 2021-08-03 DIAGNOSIS — I482 Chronic atrial fibrillation, unspecified: Secondary | ICD-10-CM | POA: Diagnosis not present

## 2021-08-03 DIAGNOSIS — I5031 Acute diastolic (congestive) heart failure: Secondary | ICD-10-CM | POA: Diagnosis not present

## 2021-08-03 DIAGNOSIS — E119 Type 2 diabetes mellitus without complications: Secondary | ICD-10-CM

## 2021-08-03 DIAGNOSIS — K7460 Unspecified cirrhosis of liver: Secondary | ICD-10-CM

## 2021-08-03 DIAGNOSIS — J9601 Acute respiratory failure with hypoxia: Secondary | ICD-10-CM | POA: Diagnosis not present

## 2021-08-03 DIAGNOSIS — K7581 Nonalcoholic steatohepatitis (NASH): Secondary | ICD-10-CM

## 2021-08-03 LAB — CBC
HCT: 37.1 % — ABNORMAL LOW (ref 39.0–52.0)
Hemoglobin: 12.2 g/dL — ABNORMAL LOW (ref 13.0–17.0)
MCH: 31.5 pg (ref 26.0–34.0)
MCHC: 32.9 g/dL (ref 30.0–36.0)
MCV: 95.9 fL (ref 80.0–100.0)
Platelets: 176 10*3/uL (ref 150–400)
RBC: 3.87 MIL/uL — ABNORMAL LOW (ref 4.22–5.81)
RDW: 17.4 % — ABNORMAL HIGH (ref 11.5–15.5)
WBC: 6.8 10*3/uL (ref 4.0–10.5)
nRBC: 0 % (ref 0.0–0.2)

## 2021-08-03 LAB — GLUCOSE, CAPILLARY
Glucose-Capillary: 142 mg/dL — ABNORMAL HIGH (ref 70–99)
Glucose-Capillary: 120 mg/dL — ABNORMAL HIGH (ref 70–99)

## 2021-08-03 LAB — BASIC METABOLIC PANEL
Anion gap: 8 (ref 5–15)
BUN: 16 mg/dL (ref 8–23)
CO2: 26 mmol/L (ref 22–32)
Calcium: 8.6 mg/dL — ABNORMAL LOW (ref 8.9–10.3)
Chloride: 105 mmol/L (ref 98–111)
Creatinine, Ser: 1.19 mg/dL (ref 0.61–1.24)
GFR, Estimated: >60 mL/min (ref >60)
Glucose, Bld: 134 mg/dL — ABNORMAL HIGH (ref 70–99)
Potassium: 3.8 mmol/L (ref 3.5–5.1)
Sodium: 139 mmol/L (ref 135–145)

## 2021-08-03 MED ORDER — METOPROLOL TARTRATE 5 MG/5ML IV SOLN
2.5000 mg | Freq: Once | INTRAVENOUS | Status: AC
  Administered 2021-08-03: 2.5 mg via INTRAVENOUS
  Filled 2021-08-03: qty 5

## 2021-08-03 MED ORDER — LEVOFLOXACIN IN D5W 750 MG/150ML IV SOLN
750.0000 mg | INTRAVENOUS | Status: DC
  Administered 2021-08-03 – 2021-08-04 (×2): 750 mg via INTRAVENOUS
  Filled 2021-08-03 (×2): qty 150

## 2021-08-03 MED ORDER — ENOXAPARIN SODIUM 40 MG/0.4ML IJ SOSY
40.0000 mg | PREFILLED_SYRINGE | INTRAMUSCULAR | Status: DC
  Administered 2021-08-03 – 2021-08-05 (×3): 40 mg via SUBCUTANEOUS
  Filled 2021-08-03 (×4): qty 0.4

## 2021-08-03 MED ORDER — ONDANSETRON HCL 4 MG/2ML IJ SOLN
4.0000 mg | INTRAMUSCULAR | Status: DC | PRN
  Administered 2021-08-03: 4 mg via INTRAVENOUS
  Filled 2021-08-03: qty 2

## 2021-08-03 MED ORDER — LEVOFLOXACIN IN D5W 250 MG/50ML IV SOLN
250.0000 mg | INTRAVENOUS | Status: DC
  Filled 2021-08-03: qty 50

## 2021-08-03 NOTE — Progress Notes (Signed)
Pt's MEWs score is previously yellow.  ?

## 2021-08-03 NOTE — Progress Notes (Signed)
?PROGRESS NOTE ? ? ?Tyler Farmer  FUX:[id number] DOB: [birth year] DOA: 08/02/2021 ?PCP: [name], MD  ? ?Chief Complaint  ?Patient presents with  ? Shortness of Breath  ? ?Level of care: Telemetry ? ?Brief Admission History:  ?81 y.o. male with history significant for multiple medical problems including atrial fibrillation, hypertension, diabetes, [place] liver cirrhosis, COPD, GI bleed, portal vein thrombosis, hepatic encephalopathy TIPs, CHF.  Patient presented to ED with complaints of difficulty breathing of 1 week duration, especially with exertion, just walking down his hallway in his house, and would improve with rest.  Spouse is at bedside and assist with the history, she reports they have been checking his weight, and its been increasing by 1 pound every day.  He called his outpatient provider, he was initially started on Lasix 40 mg daily and increased to twice daily without significant improvement in symptoms.   Patient also reports tightness in his thighs and some abdominal bloating.  He denies cough.  No chest pain. ?  ?ED Course: Temperature 97.8.  Heart rate 91-119.  Respiratory symptoms 17- 27.  O2 sats down to 88% placed on 3 L.  Blood pressure systolic ranging from 02-542.  O2 sats 91 to 98% on room air.  BNP 405.  Troponin 12 >> 11.  CTA chest negative for PE, shows large bilateral pleural effusion left greater than right, lower lobe consolidation left greater than right.  IV Lasix 40 mg x 1 given.  IV Levaquin given. ? ?08/03/2021: Pt feeling better with diuresis but still feels SOB. Continue current treatments.   ?  ?Assessment and Plan: ?* Acute diastolic CHF (congestive heart failure) ([place]) ?1+ pitting lower extremity edema from mid leg upwards towards thigh, CT chest showing bilateral large pleural effusions left greater than right, weight has increased- 205 today up from 180s -2 months ago in March.  BNP - 405, not significantly changed from prior. ?-Echo 03/2021, EF of 60 to 65% ?-IV  Lasix 40 mg twice daily. ?-Strict input output, daily weights Daily BMP ?-May benefit from thoracentesis if no significant improvement ?-Spouse reports patient take spironolactone, but not currently on medication list, will start at 12.5 twice daily ?-Did not attempt ReDS clip test on him due to large curvature of his thoracic spine.  ? ?Acute respiratory failure with hypoxia ([place]) ?O2 sats down to 88% on room air currently on 3 L.  Likely secondary to large pleural effusions from decompensated CHF. ?-Supplemental O2 ?-Diuresis for now, would get thoracentesis if no significant improvement ?-follow up CXR in AM to further assess  ? ?Large bilateral pleural effusions ?-- did not immediately order thoracentesis ?-- try diuresing with IV lasix first and then reassess  ?-- If persistent and symptomatic would consider thoracentesis if IV diuresis fails ? ?Atrial fibrillation, chronic ([place]) ?Currently RATED CONTROLLED atrial fibrillation.  EKG shows rate of 105.  Not on anticoagulation due to history of GI bleed. ? ?Community Acquired Pneumonia ?Afebrile, without leukocytosis.  WBC 6.  Denies cough.  Does not meet sepsis criteria.  CTA chest-showing consolidation left greater than right, but also shows pleural effusion that is worse on the left also.  Presentation more reflective of volume overload than pneumonia.  Possibly imaging findings of consolidation are secondary to lung collapse from large effusions. ?-Treat as pneumonia for now with Levaquin (penicillin allergy noted), low threshold to discontinue ?-procalcitonin reassuring: I wound be in favor of rapidly de-escalating and stopping antibiotics.  ? ? ?Portal hypertension (HCC) ?-- this is  chronic secondary to his liver disease  ? ?Liver cirrhosis secondary to NASH [facility]) ?Volume overloaded.  Mental status intact.  Reports 3 bowel movements daily, reports compliance with lactulose 45 mils TID and Xifaxan ?-IV Lasix for aggressive diuresis ?- Continue  spironolactone ?-Resume lactulose and Xifaxan ?-Spouse reports patient takes spironolactone, not currently on home medication list, will start at 12.44m twice daily. ? ?DM (diabetes mellitus) ([place] ?- Monitor fasting blood glucose  ?-Currently not on medication. ?- HgbA1c ? ?CBG (last 3)  ?Recent Labs  ?  08/03/21 ?0634 08/03/21 ?0730  ?GLUCAP 142* 120*  ? ? ? ?HTN (hypertension) ?-Stable. Monitoring.  ?-IV Lasix 40 twice daily ?-Continue metoprolol  25-37.5 twice daily (added holding parameters) ? ? ?DVT prophylaxis: enoxaparin ?Code Status: Full  ?Family Communication: call to daughter (Judeen Hammans 5/16, wife called no answer 5/16 ?Disposition: Status is: Inpatient ?Remains inpatient appropriate because: IV diuresis and IV antibiotics required  ?  ?Consultants:  ?TBD ?Procedures:  ?TBD ?Antimicrobials:  ?Levofloxacin 5/15>>  ?Subjective: ?Pt reports having cough and chest congestion, no chest pain.  Occasionally feels palpitations.  ?Objective: ?Vitals:  ? 08/03/21 0500 08/03/21 0634 08/03/21 1043 08/03/21 1355  ?BP:  124/79 114/61 112/72  ?Pulse:  (!) 47 (!) 103 (!) 105  ?Resp:  20 20 20   ?Temp:  97.9 ?F (36.6 ?C) (!) 97.5 ?F (36.4 ?C) 97.7 ?F (36.5 ?C)  ?TempSrc:   Oral   ?SpO2:  98% 100% 100%  ?Weight: 93.1 kg 93.1 kg    ?Height:      ? ? ?Intake/Output Summary (Last 24 hours) at 08/03/2021 1521 ?Last data filed at 08/03/2021 1300 ?Gross per 24 hour  ?Intake 200 ml  ?Output 450 ml  ?Net -250 ml  ? ?Filed Weights  ? 08/02/21 1220 08/03/21 0500 08/03/21 0634  ?Weight: 93 kg 93.1 kg 93.1 kg  ? ?Examination: ? ?General exam: pt appears chronically ill but alert and oriented, cooperative, NAD, Appears calm and comfortable  ?Respiratory system: diminished BS at bilateral bases with mild tachypnea. ?Cardiovascular system: normal S1 & S2 heard. No JVD, murmurs, rubs, gallops or clicks. No pedal edema. ?Gastrointestinal system: Abdomen is nondistended, soft and nontender. No organomegaly or masses felt. Normal bowel sounds  heard. ?Central nervous system: Alert and oriented. No focal neurological deficits. ?Extremities: 1+ edema bilateral LEs. Symmetric 5 x 5 power. ?Skin: No rashes, lesions or ulcers. ?Psychiatry: Judgement and insight appear normal. Mood & affect appropriate.  ? ?Data Reviewed: I have personally reviewed following labs and imaging studies ? ?CBC: ?Recent Labs  ?Lab 08/02/21 ?1526 08/03/21 ?03235 ?WBC 6.2 6.8  ?NEUTROABS 4.0  --   ?HGB 12.7* 12.2*  ?HCT 38.5* 37.1*  ?MCV 96.3 95.9  ?PLT 161 176  ? ? ?Basic Metabolic Panel: ?Recent Labs  ?Lab 08/02/21 ?1526 08/03/21 ?05732 ?NA 139 139  ?K 3.6 3.8  ?CL 105 105  ?CO2 29 26  ?GLUCOSE 159* 134*  ?BUN <5* 16  ?CREATININE 1.29* 1.19  ?CALCIUM 8.5* 8.6*  ? ? ?CBG: ?Recent Labs  ?Lab 08/03/21 ?0634 08/03/21 ?0730  ?GLUCAP 142* 120*  ? ? ?Recent Results (from the past 240 hour(s))  ?Resp Panel by RT-PCR (Flu A&B, Covid) Nasopharyngeal Swab     Status: None  ? Collection Time: 08/02/21  3:21 PM  ? Specimen: Nasopharyngeal Swab; Nasopharyngeal(NP) swabs in vial transport medium  ?Result Value Ref Range Status  ? SARS Coronavirus 2 by RT PCR NEGATIVE NEGATIVE Final  ?  Comment: (NOTE) ?SARS-CoV-2 target nucleic acids  are NOT DETECTED. ? ?The SARS-CoV-2 RNA is generally detectable in upper respiratory ?specimens during the acute phase of infection. The lowest ?concentration of SARS-CoV-2 viral copies this assay can detect is ?138 copies/mL. A negative result does not preclude SARS-Cov-2 ?infection and should not be used as the sole basis for treatment or ?other patient management decisions. A negative result may occur with  ?improper specimen collection/handling, submission of specimen other ?than nasopharyngeal swab, presence of viral mutation(s) within the ?areas targeted by this assay, and inadequate number of viral ?copies(<138 copies/mL). A negative result must be combined with ?clinical observations, patient history, and epidemiological ?information. The expected result is  Negative. ? ?Fact Sheet for Patients:  ?EntrepreneurPulse.com.au ? ?Fact Sheet for Healthcare Providers:  ?IncredibleEmployment.be ? ?This test is no t yet approved or cleared by the [facility]

## 2021-08-03 NOTE — TOC Initial Note (Signed)
Transition of Care (TOC) - Initial/Assessment Note  ? ? ?Patient Details  ?Name: Tyler Farmer ?MRN: [id number] ?Date of Birth: 06-12-1940 ? ?Transition of Care (TOC) CM/SW Contact:    ?Tashara Suder D, LCSW ?Phone Number: ?08/03/2021, 1:51 PM ? ?Clinical Narrative:                 ?Paient from home with spouse. Admitted for CHF. Considered high risk for readmission. Independent with ADLs. Drives when he needs to. On 3L oxygen. No history of [facility] services. Wife prepares medications for him daily. Tries to follow heart healthy diet but does not have much appetite some days. Takes daily weights and maintains appointments. Cardiologist is Dr. Harl Bowie.  ? ?Expected Discharge Plan: Home/Self Care ?Barriers to Discharge: Continued Medical Work up ? ? ?Patient Goals and CMS Choice ?Patient states their goals for this hospitalization and ongoing recovery are:: return home ?  ?  ? ?Expected Discharge Plan and Services ?Expected Discharge Plan: Home/Self Care ?  ?  ?  ?Living arrangements for the past 2 months: [place] ?                ?  ?  ?  ?  ?  ?  ?  ?  ?  ?  ? ?Prior Living Arrangements/Services ?Living arrangements for the past 2 months: [place] ?Lives with:: Spouse ?Patient language and need for interpreter reviewed:: Yes ?Do you feel safe going back to the place where you live?: Yes      ?Need for Family Participation in Patient Care: Yes (Comment) ?Care giver support system in place?: Yes (comment) ?Current home services: DME (oxygen) ?Criminal Activity/Legal Involvement Pertinent to Current Situation/Hospitalization: No - Comment as needed ? ?Activities of Daily Living ?Home Assistive Devices/Equipment: None ?ADL Screening (condition at time of admission) ?Patient's cognitive ability adequate to safely complete daily activities?: Yes ?Is the patient deaf or have difficulty hearing?: No ?Does the patient have difficulty seeing, even when wearing glasses/contacts?: No ?Does the patient have  difficulty concentrating, remembering, or making decisions?: No ?Patient able to express need for assistance with ADLs?: Yes ?Does the patient have difficulty dressing or bathing?: No ?Independently performs ADLs?: Yes (appropriate for developmental age) ?Does the patient have difficulty walking or climbing stairs?: No ?Weakness of Legs: Both ?Weakness of Arms/Hands: None ? ?Permission Sought/Granted ?Permission sought to share information with : Family Supports ?  ? Share Information with NAME: wife, Mrs. V[place]ar ?   ?   ?   ? ?Emotional Assessment ?  ?  ?  ?  ?Alcohol / Substance Use: Not Applicable ?  ? ?Admission diagnosis:  Pleural effusion [J90] ?Bilateral pleural effusion [J90] ?Community acquired pneumonia, unspecified laterality [J18.9] ?Patient Active Problem List  ? Diagnosis Date Noted  ? Bilateral pleural effusion 08/02/2021  ? Acute respiratory failure with hypoxia ([place]) 08/02/2021  ? Atrial fibrillation, chronic ([place]) 05/01/2021  ? UTI (urinary tract infection) 04/29/2021  ? Pneumonia 04/29/2021  ? Stage 3a chronic kidney disease (CKD) ([place]) 04/28/2021  ? Lactic acidosis 04/28/2021  ? Hepatic encephalopathy ([place]) 04/28/2021  ? Goals of care, counseling/discussion 04/28/2021  ? Acute diastolic CHF (congestive heart failure) ([place]) 04/12/2021  ? S/P TIPS (transjugular intrahepatic portosystemic shunt) 03/05/2021  ? Encephalopathy, hepatic ([place]) 02/19/2021  ? Portal vein thrombosis 02/12/2021  ? Elevated brain natriuretic peptide (BNP) level 01/27/2021  ? AKI (acute kidney injury) ([place]) 01/27/2021  ? Chest pain 01/27/2021  ? Abdominal pain 01/27/2021  ? 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 Portal hypertension ([place]) 01/27/2021  ? New onset of congestive heart failure ([place]) 01/27/2021  ? UGI bleed 10/26/2016  ? Vitamin B12 deficiency 10/26/2016  ? AVM (arteriovenous malformation) 10/25/2016  ? Gastric ulcer   ? Symptomatic anemia   ? Dizziness and giddiness 10/12/2016  ? Hemorrhage of rectum and anus  10/12/2016  ? Routine general medical examination at a health care facility 09/22/2016  ? Incisional hernia 05/19/2016  ? Iron deficiency anemia due to chronic blood loss 05/19/2016  ? Cirrhosis of liver ([place]) 05/19/2016  ? Esophageal varices ([place]) 05/19/2016  ? Body mass index 32.0-32.9, adult 05/19/2016  ? Anemia 05/16/2016  ? IDA (iron deficiency anemia) 10/23/2015  ? Heme positive stool 10/23/2015  ? Need for prophylactic vaccination and inoculation against single bacterial disease 04/15/2015  ? Hyperlipidemia 05/27/2014  ? HTN (hypertension) 05/27/2014  ? DM (diabetes mellitus) ([place]) 05/27/2014  ? Liver cirrhosis secondary to NASH ([place]) 05/27/2014  ? COPD (chronic obstructive pulmonary disease) ([place]) 05/27/2014  ? Acute cystitis 04/24/2014  ? Benign essential hypertension 10/04/2013  ? Hypothyroidism 10/04/2013  ? Pure hypercholesterolemia 10/04/2013  ? Other emphysema ([place]) 02/01/2013  ? Malignant neoplasm of prostate ([place]) 02/01/2013  ? Diabetes ([place]) 01/08/2013  ? Prostate cancer ([place]) 01/17/2012  ? 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 Need for prophylactic vaccination and inoculation against influenza 01/17/2012  ? Obesity 08/20/2010  ? Thyroid disease 08/20/2010  ? DM (diabetes mellitus), type 2 ([place]) 08/20/2010  ? Hypercholesterolemia 08/20/2010  ? ?PCP:  [name], MD ?Pharmacy:   ?[facility], [address] ?[place] [address] ?Phone: [phone] Fax: [phone] ? ? ? ? ?Social Determinants of Health (SDOH) Interventions ?  ? ?Readmission Risk Interventions ? ?  04/29/2021  ? 11:27 AM 04/13/2021  ?  1:11 PM  ?Readmission Risk Prevention Plan  ?Transportation Screening Complete Complete  ?[place] or Home Care Consult  Complete  ?Social Work Consult for [place] Planning/Counseling  Complete  ?Palliative Care Screening  Not Applicable  ?Medication Review Press photographer) Complete Complete  ?[place] or Home Care Consult Patient refused   ?SW Recovery  Care/Counseling Consult Complete   ?Palliative Care Screening Not Applicable   ?[place] Not Applicable   ? ? ? ?

## 2021-08-03 NOTE — Assessment & Plan Note (Signed)
--   this is chronic secondary to his liver disease  ?

## 2021-08-03 NOTE — Assessment & Plan Note (Signed)
--   did not immediately order thoracentesis ?-- try diuresing with IV lasix first and then reassess  ?-- If persistent and symptomatic would consider thoracentesis if IV diuresis fails ?

## 2021-08-03 NOTE — Hospital Course (Signed)
81 y.o. male with history significant for multiple medical problems including atrial fibrillation, hypertension, diabetes, [name] liver cirrhosis, COPD, GI bleed, portal vein thrombosis, hepatic encephalopathy TIPs, CHF.  Patient presented to ED with complaints of difficulty breathing of 1 week duration, especially with exertion, just walking down his hallway in his house, and would improve with rest.  Spouse is at bedside and assist with the history, she reports they have been checking his weight, and its been increasing by 1 pound every day.  He called his outpatient provider, he was initially started on Lasix 40 mg daily and increased to twice daily without significant improvement in symptoms.   Patient also reports tightness in his thighs and some abdominal bloating.  He denies cough.  No chest pain. ?  ?ED Course: Temperature 97.8.  Heart rate 91-119.  Respiratory symptoms 17- 27.  O2 sats down to 88% placed on 3 L.  Blood pressure systolic ranging from 83-358.  O2 sats 91 to 98% on room air.  BNP 405.  Troponin 12 >> 11.  CTA chest negative for PE, shows large bilateral pleural effusion left greater than right, lower lobe consolidation left greater than right.  IV Lasix 40 mg x 1 given.  IV Levaquin given. ? ?08/03/2021: Pt feeling better with diuresis but still feels SOB. Continue current treatments.   ?

## 2021-08-03 NOTE — Consult Note (Signed)
[facility] CM Inpatient Consult ? ? ?08/03/2021 ? ?Tyler Farmer ?05-07-1940 ?[id number] ? ?[facility] CM) ?  ?Patient chart has been reviewed with noted high risk score for unplanned readmissions.  Patient assessed for community [facility] follow up needs. Per review, patient's listed primary provider is not within the [facility] network of physicians. [facility] CM does not follow. ?  ?Of note, [facility] Management services does not replace or interfere with any services that are arranged by inpatient case management or social work.  ?  ?Netta Cedars, MSN, RN ?[facility] Liaison ?Phone [phone] ?Toll free office [phone]   ?

## 2021-08-03 NOTE — Progress Notes (Signed)
Pharmacy Antibiotic Note ? ?Tyler Farmer is a 81 y.o. male admitted on 08/02/2021 with pneumonia.  Pharmacy has been consulted for Levaquin dosing. ? ?Plan: ?Levaquin 766m IV given in ED; continue with Levaquin 2567mIV Q24H. ? ?Height: 5' 9"  (175.3 cm) ?Weight: 93 kg (205 lb) ?IBW/kg (Calculated) : 70.7 ? ?Temp (24hrs), Avg:98 ?F (36.7 ?C), Min:97.8 ?F (36.6 ?C), Max:98.2 ?F (36.8 ?C) ? ?Recent Labs  ?Lab 07/27/21 ?1013 08/02/21 ?1526  ?WBC  --  6.2  ?CREATININE 1.37* 1.29*  ?  ?Estimated Creatinine Clearance: 51.4 mL/min (A) (by C-G formula based on SCr of 1.29 mg/dL (H)).   ? ?Allergies  ?Allergen Reactions  ? Penicillins Nausea And Vomiting  ?  Has patient had a PCN reaction causing immediate rash, facial/tongue/throat swelling, SOB or lightheadedness with hypotension: Yes ?Has patient had a PCN reaction causing severe rash involving mucus membranes or skin necrosis: No ?Has patient had a PCN reaction that required hospitalization: No ?Has patient had a PCN reaction occurring within the last 10 years: No ?If all of the above answers are "NO", then may proceed with Cephalosporin use. ? ? ?Patient states that it also caused pain in his sides.  ? ? ?Thank you for allowing pharmacy to be a part of this patient?s care. ? ?VeWynona NeatPharmD, BCPS  ?08/03/2021 12:11 AM ? ?

## 2021-08-04 ENCOUNTER — Inpatient Hospital Stay (HOSPITAL_COMMUNITY): Payer: Medicare Other

## 2021-08-04 ENCOUNTER — Encounter (HOSPITAL_COMMUNITY): Payer: Self-pay | Admitting: Internal Medicine

## 2021-08-04 DIAGNOSIS — I482 Chronic atrial fibrillation, unspecified: Secondary | ICD-10-CM | POA: Diagnosis not present

## 2021-08-04 DIAGNOSIS — J189 Pneumonia, unspecified organism: Secondary | ICD-10-CM | POA: Diagnosis not present

## 2021-08-04 DIAGNOSIS — I5031 Acute diastolic (congestive) heart failure: Secondary | ICD-10-CM | POA: Diagnosis not present

## 2021-08-04 DIAGNOSIS — J9601 Acute respiratory failure with hypoxia: Secondary | ICD-10-CM | POA: Diagnosis not present

## 2021-08-04 LAB — ALBUMIN, PLEURAL OR PERITONEAL FLUID: Albumin, Fluid: <1.5 g/dL

## 2021-08-04 LAB — PROTEIN, PLEURAL OR PERITONEAL FLUID: Total protein, fluid: <3 g/dL

## 2021-08-04 LAB — GLUCOSE, PLEURAL OR PERITONEAL FLUID: Glucose, Fluid: 174 mg/dL

## 2021-08-04 LAB — GRAM STAIN

## 2021-08-04 LAB — BODY FLUID CELL COUNT WITH DIFFERENTIAL
Eos, Fluid: 1 %
Lymphs, Fluid: 48 %
Monocyte-Macrophage-Serous Fluid: 37 % — ABNORMAL LOW (ref 50–90)
Neutrophil Count, Fluid: 14 % (ref 0–25)
Other Cells, Fluid: 1 %
Total Nucleated Cell Count, Fluid: 368 cu mm (ref 0–1000)

## 2021-08-04 LAB — GLUCOSE, CAPILLARY
Glucose-Capillary: 129 mg/dL — ABNORMAL HIGH (ref 70–99)
Glucose-Capillary: 120 mg/dL — ABNORMAL HIGH (ref 70–99)

## 2021-08-04 LAB — LACTATE DEHYDROGENASE, PLEURAL OR PERITONEAL FLUID: LD, Fluid: 73 U/L — ABNORMAL HIGH (ref 3–23)

## 2021-08-04 MED ORDER — LIDOCAINE HCL (PF) 2 % IJ SOLN
INTRAMUSCULAR | Status: AC
  Filled 2021-08-04: qty 10

## 2021-08-04 MED ORDER — TRAZODONE HCL 50 MG PO TABS
50.0000 mg | ORAL_TABLET | Freq: Every day | ORAL | Status: DC
  Administered 2021-08-04: 50 mg via ORAL
  Filled 2021-08-04: qty 1

## 2021-08-04 NOTE — Procedures (Signed)
INDICATION: ?Bilateral pleural effusions, left greater than right. ?EXAM: ?ULTRASOUND GUIDED LEFT THORACENTESIS ?GRIP-IR: ?Category: Fluids ? ?Subcategory: Thoracentesis ? ?Follow-Up: None ? ?MEDICATIONS: ?None. ?COMPLICATIONS: ?None immediate. ?PROCEDURE: ?An ultrasound guided thoracentesis was thoroughly discussed with the patient and questions answered.  The benefits, risks (including hemorrhage, infection, and pneumothorax, among others), alternatives and complications were also discussed.  The patient understands and wishes to proceed with the procedure.  Written consent was obtained. ? ?Standard time-out was performed.  Ultrasound was performed to localize and mark an adequate pocket of fluid in the left chest.  The area was then prepped and draped in the normal sterile fashion. 1% Lidocaine was used for local anesthesia.  Under ultrasound guidance a Safe-T-Centesis type catheter was introduced. Thoracentesis was performed. The catheter was removed and a dressing applied.  Postprocedural expiratory radiograph demonstrated no pneumothorax.  ? ?FINDINGS: ?A total of approximately 1250 of serosanguineous fluid was removed. Samples were sent to the laboratory as requested by the clinical team. ?IMPRESSION: ?Successful ultrasound guided left thoracentesis yielding 1250 of pleural fluid. ? ?

## 2021-08-04 NOTE — Plan of Care (Signed)
?  Problem: Acute Rehab PT Goals(only PT should resolve) ?Goal: Pt Will Go Supine/Side To Sit ?Outcome: Progressing ?Flowsheets (Taken 08/04/2021 1018) ?Pt will go Supine/Side to Sit: Independently ?Goal: Patient Will Transfer Sit To/From Stand ?Outcome: Progressing ?Flowsheets (Taken 08/04/2021 1018) ?Patient will transfer sit to/from stand: Independently ?Goal: Pt Will Transfer Bed To Chair/Chair To Bed ?Outcome: Progressing ?Flowsheets (Taken 08/04/2021 1018) ?Pt will Transfer Bed to Chair/Chair to Bed: with supervision ?Goal: Pt Will Ambulate ?Outcome: Progressing ?Flowsheets (Taken 08/04/2021 1018) ?Pt will Ambulate: ? 100 feet ? with supervision ?  ?

## 2021-08-04 NOTE — TOC Progression Note (Signed)
Transition of Care (TOC) - Progression Note  ? ? ?Patient Details  ?Name: Tyler Farmer ?MRN: [id number] ?Date of Birth: 07-20-1940 ? ?Transition of Care (TOC) CM/SW Contact  ?Kendre Sires D, LCSW ?Phone Number: ?08/04/2021, 4:01 PM ? ?Clinical Narrative:    ?Patient recommended for HHPT. Discussed with wife, [name]. HHPT declined at this time as he states that patient does not need it.  ? ? ?Expected Discharge Plan: Home/Self Care ?Barriers to Discharge: Continued Medical Work up ? ?Expected Discharge Plan and Services ?Expected Discharge Plan: Home/Self Care ?  ?  ?  ?Living arrangements for the past 2 months: [place] ?                ?  ?  ?  ?  ?  ?  ?  ?  ?  ?  ? ? ?Social Determinants of Health (SDOH) Interventions ?  ? ?Readmission Risk Interventions ? ?  04/29/2021  ? 11:27 AM 04/13/2021  ?  1:11 PM  ?Readmission Risk Prevention Plan  ?Transportation Screening Complete Complete  ?[place] or Home Care Consult  Complete  ?Social Work Consult for [place] Planning/Counseling  Complete  ?Palliative Care Screening  Not Applicable  ?Medication Review Press photographer) Complete Complete  ?[place] or Home Care Consult Patient refused   ?SW Recovery Care/Counseling Consult Complete   ?Palliative Care Screening Not Applicable   ?[place] Not Applicable   ? ? ?

## 2021-08-04 NOTE — Progress Notes (Signed)
PT tolerated left sided thoracentesis procedure well today and 1250 mL of serosanguinous colored fluid removed and sent to lab for processing. PT went via stretcher for chest xray post procedure and verbalized understanding of post procedure instructions.  PT returned to inpatient bed assignment at this time with no acute distress noted.  ?

## 2021-08-04 NOTE — Progress Notes (Signed)
PROGRESS NOTE   Tyler Farmer  JXB:[id number] DOB: [birth year] DOA: 08/02/2021 PCP: [name], MD   Chief Complaint  Patient presents with   Shortness of Breath   Level of care: Telemetry  Brief Admission History:  81 y.o. male with history significant for multiple medical problems including atrial fibrillation, hypertension, diabetes, [name] liver cirrhosis, COPD, GI bleed, portal vein thrombosis, hepatic encephalopathy TIPs, CHF.  Patient presented to ED with complaints of difficulty breathing of 1 week duration, especially with exertion, just walking down his hallway in his house, and would improve with rest.  Spouse is at bedside and assist with the history, she reports they have been checking his weight, and its been increasing by 1 pound every day.  He called his outpatient provider, he was initially started on Lasix 40 mg daily and increased to twice daily without significant improvement in symptoms.   Patient also reports tightness in his thighs and some abdominal bloating.  He denies cough.  No chest pain.   ED Course: Temperature 97.8.  Heart rate 91-119.  Respiratory symptoms 17- 27.  O2 sats down to 88% placed on 3 L.  Blood pressure systolic ranging from 13-086.  O2 sats 91 to 98% on room air.  BNP 405.  Troponin 12 >> 11.  CTA chest negative for PE, shows large bilateral pleural effusion left greater than right, lower lobe consolidation left greater than right.  IV Lasix 40 mg x 1 given.  IV Levaquin given.  08/03/2021: Pt feeling better with diuresis but still feels SOB. Continue current treatments.     Assessment and Plan: *Acute on chronic diastolic CHF (congestive heart failure) (HCC) -Echo 03/2021, EF of 60 to 65% Respiratory status improving after thoracentesis with removal of 1.25 L of fluid from the left pleural space on 08/04/2021 -Did not attempt ReDS clip test on him due to large curvature of his thoracic spine.  -Pleural Fluid protein is low however pleural fluid  LDH is high--- there is concern for pneumonia which could explain this trend towards exudative fluid -Given recurrent nature of pleural fluid patient has been advised to follow-up with pulmonologist Dr. Melida Quitter further evaluation to exclude the possibility of underlying malignancy -Weight is trending down  from 205 lb on admission -c/n  IV Lasix and aldacctone   Acute on chronic respiratory failure with hypoxia ([place]) Likely secondary to large pleural effusions from decompensated CHF. Currently requiring 5 L of oxygen via nasal cannula Overall improving after thoracentesis on 08/04/2021     AKI----acute kidney injury on CKD stage -3A -Creatinine trending back down  -Lasix adjusted as above renally adjust medications, avoid nephrotoxic agents / dehydration  / hypotension   Community Acquired Pneumonia -CHF Vs pneumonia Vs BOTH -will Repeat chest x-ray on 08/06/2021  -Continue Levaquin for now   Large bilateral pleural effusions -- Left-sided thoracentesis as outlined above #1 -Repeat chest x-ray on 08/06/21   Atrial fibrillation, chronic (HCC) Currently RATED CONTROLLED atrial fibrillation.  -  Not on anticoagulation due to history of GI bleed.   Portal hypertension (HCC) -- this is chronic secondary to his liver disease    Liver cirrhosis secondary to NASH [facility]) Mental status intact.   reports compliance with lactulose 45 mils TID and Xifaxan Lasix and Aldactone as above #1  Continue lactulose and Xifaxan -Keep appointment with Dr. Melony Overly for 08/09/2021   DM (diabetes mellitus) ([place]) -Diet controlled    HTN (hypertension) -Continue Lasix and Aldactone and metoprolol  25-37.5 twice daily  DVT prophylaxis: enoxaparin Code Status: Full  Family Communication: updated daughter and wife at bedside Disposition: Status is: Inpatient Remains inpatient appropriate because: IV diuresis and IV antibiotics required   Procedures:  thoracentensis Antimicrobials:  Levofloxacin 5/15>>    Subjective:  Shob improving post thora No fever  Or chills   No Nausea, Vomiting or Diarrhea   Objective: Vitals:   08/04/21 1331 08/04/21 1341 08/04/21 1351 08/04/21 1516  BP: 118/86 118/74 113/70 94/70  Pulse: 96 86 (!) 106   Resp: 18 18 18    Temp:    97.7 F (36.5 C)  TempSrc:    Oral  SpO2: 100% 100% 100%   Weight:      Height:        Intake/Output Summary (Last 24 hours) at 08/04/2021 1904 Last data filed at 08/04/2021 1809 Gross per 24 hour  Intake 1110 ml  Output --  Net 1110 ml   Filed Weights   08/03/21 0500 08/03/21 0634 08/04/21 0500  Weight: 93.1 kg 93.1 kg 92.8 kg   Examination:  General exam: pt appears chronically ill but alert and oriented, cooperative, NAD, Appears calm and comfortable  Respiratory system: slightly improved air movement Nose--[place] 5L/min Cardiovascular system: normal S1 & S2 heard. No JVD, murmurs, rubs, gallops or clicks. No pedal edema. Gastrointestinal system: Abdomen is nondistended, soft and nontender.  Normal bowel sounds heard. Central nervous system: Alert and oriented. No focal neurological deficits. Extremities: 1+ edema bilateral LEs. Symmetric 5 x 5 power. Skin: No rashes, lesions or ulcers. Psychiatry: Judgement and insight appear normal. Mood & affect appropriate.   Data Reviewed: I have personally reviewed following labs and imaging studies  CBC: Recent Labs  Lab 08/02/21 1526 08/03/21 0517  WBC 6.2 6.8  NEUTROABS 4.0  --   HGB 12.7* 12.2*  HCT 38.5* 37.1*  MCV 96.3 95.9  PLT 161 161    Basic Metabolic Panel: Recent Labs  Lab 08/02/21 1526 08/03/21 0517  NA 139 139  K 3.6 3.8  CL 105 105  CO2 29 26  GLUCOSE 159* 134*  BUN <5* 16  CREATININE 1.29* 1.19  CALCIUM 8.5* 8.6*    CBG: Recent Labs  Lab 08/03/21 0634 08/03/21 0730 08/04/21 0455 08/04/21 0708  GLUCAP 142* 120* 129* 120*    Recent Results (from the past 240 hour(s))  Resp Panel by RT-PCR (Flu A&B, Covid) Nasopharyngeal Swab      Status: None   Collection Time: 08/02/21  3:21 PM   Specimen: Nasopharyngeal Swab; Nasopharyngeal(NP) swabs in vial transport medium  Result Value Ref Range Status   SARS Coronavirus 2 by RT PCR NEGATIVE NEGATIVE Final    Comment: (NOTE) SARS-CoV-2 target nucleic acids are NOT DETECTED.  The SARS-CoV-2 RNA is generally detectable in upper respiratory specimens during the acute phase of infection. The lowest concentration of SARS-CoV-2 viral copies this assay can detect is 138 copies/mL. A negative result does not preclude SARS-Cov-2 infection and should not be used as the sole basis for treatment or other patient management decisions. A negative result may occur with  improper specimen collection/handling, submission of specimen other than nasopharyngeal swab, presence of viral mutation(s) within the areas targeted by this assay, and inadequate number of viral copies(<138 copies/mL). A negative result must be combined with clinical observations, patient history, and epidemiological information. The expected result is Negative.  Fact Sheet for Patients:  EntrepreneurPulse.com.au  Fact Sheet for Healthcare Providers:  IncredibleEmployment.be  This test is no t yet approved or cleared by the  [place] Architectural technologist and  has been authorized for detection and/or diagnosis of SARS-CoV-2 by FDA under an Print production planner (EUA). This EUA will remain  in effect (meaning this test can be used) for the duration of the COVID-19 declaration under Section 564(b)(1) of the Act, 21 U.S.C.section 360bbb-3(b)(1), unless the authorization is terminated  or revoked sooner.       Influenza A by PCR NEGATIVE NEGATIVE Final   Influenza B by PCR NEGATIVE NEGATIVE Final    Comment: (NOTE) The Xpert Xpress SARS-CoV-2/FLU/RSV plus assay is intended as an aid in the diagnosis of influenza from Nasopharyngeal swab specimens and should not be used as a sole basis  for treatment. Nasal washings and aspirates are unacceptable for Xpert Xpress SARS-CoV-2/FLU/RSV testing.  Fact Sheet for Patients: EntrepreneurPulse.com.au  Fact Sheet for Healthcare Providers: IncredibleEmployment.be  This test is not yet approved or cleared by the [place] FDA and has been authorized for detection and/or diagnosis of SARS-CoV-2 by FDA under an Emergency Use Authorization (EUA). This EUA will remain in effect (meaning this test can be used) for the duration of the COVID-19 declaration under Section 564(b)(1) of the Act, 21 U.S.C. section 360bbb-3(b)(1), unless the authorization is terminated or revoked.  Performed at [facility], [address] [place], [place] [address]   Culture, body fluid w Gram Stain-bottle     Status: None (Preliminary result)   Collection Time: 08/04/21  1:30 PM   Specimen: Pleura  Result Value Ref Range Status   Specimen Description PLEURAL  Final   Special Requests   Final    BOTTLES DRAWN AEROBIC AND ANAEROBIC Blood Culture results may not be optimal due to an excessive volume of blood received in culture bottles Performed at [facility], [address], [place], [place] [address]    Culture PENDING  Incomplete   Report Status PENDING  Incomplete  Gram stain     Status: None   Collection Time: 08/04/21  1:30 PM   Specimen: Pleura  Result Value Ref Range Status   Specimen Description PLEURAL  Final   Special Requests PLEURAL  Final   Gram Stain   Final    NO ORGANISMS SEEN WBC PRESENT,BOTH PMN AND MONONUCLEAR CYTOSPIN SMEAR Performed at [facility], [address] [place], [place] [address]    Report Status 08/04/2021 FINAL  Final     Radiology Studies: DG Chest 1 View  Result Date: 08/04/2021 CLINICAL DATA:  Pleural effusions, status post left thoracentesis. EXAM: CHEST  1 VIEW COMPARISON:  08/04/2021 at 6:14 a.m. FINDINGS: Extra tori frontal view of the chest image straits  substantial reduction in the size of the left pleural effusion. No visible pneumothorax. Mild blunting of the right costophrenic angle compatible with right pleural effusion. Borderline enlargement of the cardiopericardial silhouette. Atherosclerotic calcification of the aortic arch. Emphysema noted. IMPRESSION: 1. Substantial reduction in size of the left pleural effusion, status post left thoracentesis. No pneumothorax identified. 2. Stable right pleural effusion. 3. Borderline enlargement of the cardiopericardial silhouette. 4. Aortic Atherosclerosis (ICD10-I70.0) and Emphysema (ICD10-J43.9). Electronically Signed   By: Van Clines M.D.   On: 08/04/2021 14:15   DG Chest 2 View  Result Date: 08/04/2021 CLINICAL DATA:  Increased sob/a fib/asthma/diabetic/htn/ex smoker/bilateral pleural effusion EXAM: CHEST - 2 VIEW COMPARISON:  08/02/2021 FINDINGS: Slight improvement in the interstitial edema or infiltrates since prior study, with persistent coarse interstitial markings throughout. Moderate left effusion with consolidation/atelectasis at the left base as before. Small right effusion stable. Heart size  difficult to assess due to adjacent opacities. Aortic Atherosclerosis (ICD10-170.0). No pneumothorax. Visualized bones unremarkable. Left upper abdominal embolization coils. IMPRESSION: 1. Improving interstitial infiltrates or edema. 2. Persistent moderate left and small right pleural effusions. Electronically Signed   By: Lucrezia Europe M.D.   On: 08/04/2021 07:09   CT Angio Chest PE W/Cm &/Or Wo Cm  Result Date: 08/02/2021 CLINICAL DATA:  Shortness of breath for 1 week EXAM: CT ANGIOGRAPHY CHEST WITH CONTRAST TECHNIQUE: Multidetector CT imaging of the chest was performed using the standard protocol during bolus administration of intravenous contrast. Multiplanar CT image reconstructions and MIPs were obtained to evaluate the vascular anatomy. RADIATION DOSE REDUCTION: This exam was performed according to  the departmental dose-optimization program which includes automated exposure control, adjustment of the mA and/or kV according to patient size and/or use of iterative reconstruction technique. CONTRAST:  1m OMNIPAQUE IOHEXOL 350 MG/ML SOLN COMPARISON:  Plain film from earlier in the same day. FINDINGS: Cardiovascular: Thoracic aorta demonstrates atherosclerotic calcifications without aneurysmal dilatation or dissection. Heart is not significantly enlarged. Heavy coronary calcifications are noted. The pulmonary artery shows a normal branching pattern bilaterally without focal filling defect to suggest pulmonary embolism. Mediastinum/Nodes: Thoracic inlet is within normal limits. No hilar or mediastinal adenopathy is noted. The esophagus as visualized is within normal limits. Lungs/Pleura: Lungs demonstrate bilateral pleural effusions left considerably greater than that on the right. Lower lobe consolidation is noted left greater than right as well. Some atelectatic changes in the right upper lobe are seen. Diffuse emphysematous changes are seen. No discrete nodule is identified to correspond with that seen on recent plain film examination Upper Abdomen: TIPS stent is noted. Mild nodularity of the liver is noted. Stable renal cysts are noted on the right. No follow-up recommended. Musculoskeletal: Degenerative changes of the thoracic spine are seen. No acute rib abnormality is noted. Review of the MIP images confirms the above findings. IMPRESSION: No evidence of pulmonary emboli. Large bilateral pleural effusions left greater than right. Lower lobe consolidation left greater than right. No definitive nodule is identified in the right upper lobe correspond with that seen on prior plain film examination this likely represents confluence of parenchymal and bony shadows. Aortic Atherosclerosis (ICD10-I70.0) and Emphysema (ICD10-J43.9). Electronically Signed   By: MInez CatalinaM.D.   On: 08/02/2021 20:57   [place] THORACENTESIS ASP PLEURAL SPACE W/IMG GUIDE  Result Date: 08/04/2021 INDICATION: Bilateral pleural effusions, left greater than right. EXAM: ULTRASOUND GUIDED LEFT THORACENTESIS MEDICATIONS: None. COMPLICATIONS: None immediate. PROCEDURE: An ultrasound guided thoracentesis was thoroughly discussed with the patient and questions answered. The benefits, risks (including hemorrhage, infection, and pneumothorax, among others), alternatives and complications were also discussed. The patient understands and wishes to proceed with the procedure. Written consent was obtained. Standard time-out was performed. Ultrasound was performed to localize and mark an adequate pocket of fluid in the left chest. The area was then prepped and draped in the normal sterile fashion. 1% Lidocaine was used for local anesthesia. Under ultrasound guidance a Safe-T-Centesis type catheter was introduced. Thoracentesis was performed. The catheter was removed and a dressing applied. Postprocedural expiratory radiograph demonstrated no pneumothorax. FINDINGS: A total of approximately 1250 of serosanguineous fluid was removed. Samples were sent to the laboratory as requested by the clinical team. IMPRESSION: Successful ultrasound guided left thoracentesis yielding 1250 of pleural fluid. Electronically Signed   By: WVan ClinesM.D.   On: 08/04/2021 14:20    Scheduled Meds:  aspirin EC  81 mg Oral Q breakfast  enoxaparin (LOVENOX) injection  40 mg Subcutaneous Q24H   furosemide  40 mg Intravenous BID   lactulose  30 g Oral TID   levothyroxine  137 mcg Oral Q0600   lidocaine HCl (PF)       metoCLOPramide  5 mg Oral TID AC   metoprolol tartrate  25 mg Oral QHS   metoprolol tartrate  37.5 mg Oral Daily   pantoprazole  40 mg Oral BID   rifaximin  550 mg Oral BID   spironolactone  12.5 mg Oral BID   traZODone  50 mg Oral QHS   Continuous Infusions:  levofloxacin (LEVAQUIN) IV 750 mg (08/03/21 2203)    LOS: 2 days    Roxan Hockey, MD How to contact the [facility] Attending or Consulting provider [place] or covering provider during after hours [place], for this patient?  Check the care team in [facility] and look for a) attending/consulting TR[facility] provider listed and b) the [facility] team listed Log into [email or web address] and use [place]'s universal password to access. If you do not have the password, please contact the hospital operator. Locate the [facility] provider you are looking for under [facility] and page to a number that you can be directly reached. If you still have difficulty reaching the provider, please page the [facility] (Director on Call) for the Hospitalists listed on amion for assistance.  08/04/2021, 7:04 PM

## 2021-08-04 NOTE — Evaluation (Signed)
Physical Therapy Evaluation ?Patient Details ?Name: Tyler Farmer ?MRN: [id number] ?DOB: [birth year] ?Today's Date: 08/04/2021 ? ?History of Present Illness ? Kayson Tasker Morandi is a 81 y.o. male with history significant for multiple medical problems including atrial fibrillation, hypertension, diabetes, [name] liver cirrhosis, COPD, GI bleed, portal vein thrombosis, hepatic encephalopathy TIPs, CHF.  Patient presented to ED with complaints of difficulty breathing of 1 week duration, especially with exertion, just walking down his hallway in his house, and would improve with rest.  Spouse is at bedside and assist with the history, she reports they have been checking his weight, and its been increasing by 1 pound every day.  He called his outpatient provider, he was initially started on Lasix 40 mg daily and increased to twice daily without significant improvement in symptoms.    Patient also reports tightness in his thighs and some abdominal bloating.  He denies cough.  No chest pain. ? ?  ?Clinical Impression ? Patient able to get out of bed and transfer to chair with Supervision/mod independent; He is on 3 L of O2 and is able to walk in the room today with CGA/SBA from therapist x 30 ft.  He reports no pain today.  He walks with slight forward flexed trunk and decreased gait speed but does not use an AD. Patient will benefit from continued therapy services during the duration of his hospital stay at the next recommended venue of care to address deficits and promote return to optimal functional mobility.    ?   ? ?Recommendations for follow up therapy are one component of a multi-disciplinary discharge planning process, led by the attending physician.  Recommendations may be updated based on patient status, additional functional criteria and insurance authorization. ? ?Follow Up Recommendations Home health PT ? ?  ?Assistance Recommended at Discharge Intermittent Supervision/Assistance  ?Patient can return home with  the following ? A little help with walking and/or transfers;A little help with bathing/dressing/bathroom;Help with stairs or ramp for entrance ? ?  ?Equipment Recommendations None recommended by PT  ?Recommendations for Other Services ?    ?  ?Functional Status Assessment Patient has had a recent decline in their functional status and demonstrates the ability to make significant improvements in function in a reasonable and predictable amount of time.  ? ?  ?Precautions / Restrictions Precautions ?Precautions: Fall ?Restrictions ?Weight Bearing Restrictions: No  ? ?  ? ?Mobility ? Bed Mobility ?Overal bed mobility: Modified Independent ?  ?  ?  ?  ?  ?  ?General bed mobility comments: takes extra time ?  ? ?Transfers ?Overall transfer level: Modified independent ?  ?  ?  ?  ?  ?  ?  ?  ?General transfer comment: takes extra time ?  ? ?Ambulation/Gait ?Ambulation/Gait assistance: Modified independent (Device/Increase time) ?Gait Distance (Feet): 30 Feet ?  ?Gait Pattern/deviations: Decreased step length - left, Decreased step length - right, Knee flexed in stance - left, Knee flexed in stance - right ?  ?  ?  ?General Gait Details: slower than normal gait speed; forward flexed trunk ? ?Stairs ?  ?  ?  ?  ?  ? ?Wheelchair Mobility ?  ? ?Modified Rankin (Stroke Patients Only) ?  ? ?  ? ?Balance Overall balance assessment: Modified Independent ?  ?  ?  ?  ?  ?  ?  ?  ?  ?  ?  ?  ?  ?  ?  ?  ?  ?  ?   ? ? ? ?  Pertinent Vitals/Pain Pain Assessment ?Pain Assessment: No/denies pain  ? ? ?Home Living   ?Living Arrangements: Spouse/significant other ?  ?  ?  ?  ?  ?  ?  ?  ?   ?  ?Prior Function Prior Level of Function : Independent/Modified Independent ?  ?  ?  ?  ?  ?  ?Mobility Comments: Community ambulator without AD, occasionally drives ?ADLs Comments: Pt reports independence with ADL's and IADL's other than wife being the one get groceries. ?  ? ? ?Hand Dominance  ? Dominant Hand: Right ? ?  ?Extremity/Trunk Assessment  ?  Upper Extremity Assessment ?Upper Extremity Assessment: Generalized weakness ?  ? ?Lower Extremity Assessment ?Lower Extremity Assessment: Generalized weakness ?  ? ?Cervical / Trunk Assessment ?Cervical / Trunk Assessment: Kyphotic  ?Communication  ? Communication: No difficulties  ?Cognition Arousal/Alertness: Awake/alert ?Behavior During Therapy: [facility] for tasks assessed/performed ?Overall Cognitive Status: Within Functional Limits for tasks assessed ?  ?  ?  ?  ?  ?  ?  ?  ?  ?  ?  ?  ?  ?  ?  ?  ?  ?  ?  ? ?  ?General Comments   ? ?  ?Exercises    ? ?Assessment/Plan  ?  ?PT Assessment Patient needs continued PT services  ?PT Problem List Decreased strength;Decreased activity tolerance;Decreased balance;Decreased mobility ? ?   ?  ?PT Treatment Interventions Gait training;Functional mobility training;Therapeutic activities;Therapeutic exercise;Patient/family education;Stair training;Balance training   ? ?PT Goals (Current goals can be found in the Care Plan section)  ?Acute Rehab PT Goals ?Patient Stated Goal: return home ?PT Goal Formulation: With patient/family ?Time For Goal Achievement: 08/18/21 ?Potential to Achieve Goals: Good ? ?  ?Frequency Min 2X/week ?  ? ? ?Co-evaluation   ?  ?  ?  ?  ? ? ?  ?AM-PAC PT "6 Clicks" Mobility  ?Outcome Measure Help needed turning from your back to your side while in a flat bed without using bedrails?: None ?Help needed moving from lying on your back to sitting on the side of a flat bed without using bedrails?: None ?Help needed moving to and from a bed to a chair (including a wheelchair)?: A Little ?Help needed standing up from a chair using your arms (e.g., wheelchair or bedside chair)?: A Little ?Help needed to walk in hospital room?: A Little ?Help needed climbing 3-5 steps with a railing? : A Lot ?6 Click Score: 19 ? ?  ?End of Session Equipment Utilized During Treatment: Oxygen ?Activity Tolerance: Patient tolerated treatment well ?Patient left: in chair;with  family/visitor present;with call bell/phone within reach ?Nurse Communication: Mobility status ?PT Visit Diagnosis: Muscle weakness (generalized) (M62.81) ?  ? ?Time: 5732-2025 ?PT Time Calculation (min) (ACUTE ONLY): 14 min ? ? ?Charges:   PT Evaluation ?$PT Eval Low Complexity: 1 Low ?PT Treatments ?$Therapeutic Activity: 8-22 mins ?  ?   ? ? ?10:17 AM, 08/04/21 ?Darlisha Kelm Small Anedra Penafiel MPT ?[facility] ?[place] [phone] ?Ph:[phone] ? ?

## 2021-08-05 DIAGNOSIS — I5031 Acute diastolic (congestive) heart failure: Secondary | ICD-10-CM | POA: Diagnosis not present

## 2021-08-05 LAB — RENAL FUNCTION PANEL
Albumin: 2.2 g/dL — ABNORMAL LOW (ref 3.5–5.0)
Anion gap: 10 (ref 5–15)
BUN: 18 mg/dL (ref 8–23)
CO2: 25 mmol/L (ref 22–32)
Calcium: 8.7 mg/dL — ABNORMAL LOW (ref 8.9–10.3)
Chloride: 103 mmol/L (ref 98–111)
Creatinine, Ser: 1.54 mg/dL — ABNORMAL HIGH (ref 0.61–1.24)
GFR, Estimated: 45 mL/min — ABNORMAL LOW (ref >60)
Glucose, Bld: 129 mg/dL — ABNORMAL HIGH (ref 70–99)
Phosphorus: 3.8 mg/dL (ref 2.5–4.6)
Potassium: 4 mmol/L (ref 3.5–5.1)
Sodium: 138 mmol/L (ref 135–145)

## 2021-08-05 LAB — GLUCOSE, CAPILLARY
Glucose-Capillary: 155 mg/dL — ABNORMAL HIGH (ref 70–99)
Glucose-Capillary: 113 mg/dL — ABNORMAL HIGH (ref 70–99)

## 2021-08-05 MED ORDER — FUROSEMIDE 10 MG/ML IJ SOLN: 40.0000 mg | Freq: Every day | INTRAMUSCULAR | Status: DC

## 2021-08-05 MED ORDER — LEVOFLOXACIN IN D5W 500 MG/100ML IV SOLN: 500.0000 mg | INTRAVENOUS | Status: DC

## 2021-08-05 MED ORDER — LEVOFLOXACIN 500 MG PO TABS
500.0000 mg | ORAL_TABLET | Freq: Every day | ORAL | Status: DC
  Administered 2021-08-05: 500 mg via ORAL
  Filled 2021-08-05: qty 1

## 2021-08-05 NOTE — Progress Notes (Signed)
Per patient family they do not want patient to receive his scheduled trazodone tonight. Wife stated that patient was drowsy this morning from taking this. Nightshift nurse made aware of this.

## 2021-08-05 NOTE — Progress Notes (Addendum)
PROGRESS NOTE   Tyler Farmer  XKG:[id number] DOB: [birth year] DOA: 08/02/2021 PCP: [name], MD   Chief Complaint  Patient presents with   Shortness of Breath   Level of care: Telemetry  Brief Admission History:  81 y.o. male with history significant for multiple medical problems including atrial fibrillation, hypertension, diabetes, [name] liver cirrhosis, COPD, GI bleed, portal vein thrombosis, hepatic encephalopathy TIPs, CHF.  Patient presented to ED with complaints of difficulty breathing of 1 week duration, especially with exertion, just walking down his hallway in his house, and would improve with rest.  Spouse is at bedside and assist with the history, she reports they have been checking his weight, and its been increasing by 1 pound every day.  He called his outpatient provider, he was initially started on Lasix 40 mg daily and increased to twice daily without significant improvement in symptoms.   Patient also reports tightness in his thighs and some abdominal bloating.  He denies cough.  No chest pain.   ED Course: Temperature 97.8.  Heart rate 91-119.  Respiratory symptoms 17- 27.  O2 sats down to 88% placed on 3 L.  Blood pressure systolic ranging from 70-263.  O2 sats 91 to 98% on room air.  BNP 405.  Troponin 12 >> 11.  CTA chest negative for PE, shows large bilateral pleural effusion left greater than right, lower lobe consolidation left greater than right.  IV Lasix 40 mg x 1 given.  IV Levaquin given.  08/03/2021: Pt feeling better with diuresis but still feels SOB. Continue current treatments.     Assessment and Plan: * Acute diastolic CHF (congestive heart failure) (HCC) , CT chest showing bilateral large pleural effusions left greater than right, .  BNP - 405, not significantly changed from prior. -Echo 03/2021, EF of 60 to 65% -Change IV Lasix to once daily due to AKI -Strict input output, daily weights Daily BMP Respiratory status improving after thoracentesis  with removal of 1.25 L of fluid from the left pleural space on 08/04/2021 -Did not attempt ReDS clip test on him due to large curvature of his thoracic spine.   Acute on chronic respiratory failure with hypoxia (HCC) Likely secondary to large pleural effusions from decompensated CHF. Currently requiring 3 L of oxygen via nasal cannula Overall improving after thoracentesis on 08/04/2021 as outlined above  AKI----acute kidney injury on CKD stage -3A -Creatinine jump to 1.54 from 1.11 the previously -Lasix adjusted as above renally adjust medications, avoid nephrotoxic agents / dehydration  / hypotension  Community Acquired Pneumonia -Clinical picture more consistent with CHF than pneumonia -Repeat chest x-ray on 08/06/2021 as patient already had thoracentesis -Continue Levaquin for now  Large bilateral pleural effusions -- Left-sided thoracentesis as outlined above #1  Atrial fibrillation, chronic (HCC) Currently RATED CONTROLLED atrial fibrillation.  .  Not on anticoagulation due to history of GI bleed.  Portal hypertension (HCC) -- this is chronic secondary to his liver disease   Liver cirrhosis secondary to NASH (HCC) Volume overloaded.  Mental status intact.  Reports 3 bowel movements daily, reports compliance with lactulose 45 mils TID and Xifaxan Lasix and Aldactone as above #1  Continue lactulose and Xifaxan  DM (diabetes mellitus) ([place]) - Monitor fasting blood glucose  -Currently not on medication.   HTN (hypertension) -Stable.    IV Lasix -Continue metoprolol  25-37.5 twice daily (added holding parameters)   DVT prophylaxis: enoxaparin Code Status: Full  Family Communication: call to daughter Judeen Hammans) 5/16, wife called no  answer 5/16 Disposition: Status is: Inpatient Remains inpatient appropriate because: IV diuresis and IV antibiotics required    Consultants:  TBD Procedures:  TBD Antimicrobials:  Levofloxacin 5/15>>  Subjective: =-Wife and daughter at  bedside -Dyspnea at rest resolved dyspnea on exertion improving -Continues to require oxygen at 2 L by nasal cannula -Voiding okay  Objective: Vitals:   08/04/21 2123 08/05/21 0455 08/05/21 0500 08/05/21 1256  BP: (!) 100/59 106/60  102/63  Pulse: 89 88  85  Resp: 18 18  20   Temp: 98 F (36.7 C) 98 F (36.7 C)  (!) 97.5 F (36.4 C)  TempSrc: Oral Oral    SpO2: 100% 99%  100%  Weight:   86.1 kg   Height:   5' 9"  (1.753 m)     Intake/Output Summary (Last 24 hours) at 08/05/2021 1904 Last data filed at 08/05/2021 1700 Gross per 24 hour  Intake 680 ml  Output --  Net 680 ml   Filed Weights   08/03/21 0634 08/04/21 0500 08/05/21 0500  Weight: 93.1 kg 92.8 kg 86.1 kg   Examination:  General exam: pt appears chronically ill but alert and oriented, cooperative, NAD, Appears calm and comfortable  Respiratory system: Improving air movement on the left, no wheezing  Nose- [place] 3L/ min cardiovascular system: normal S1 & S2 heard.  Gastrointestinal system: Abdomen is nondistended, soft and nontender. Normal bowel sounds heard. Central nervous system: Alert and oriented. No focal neurological deficits. Extremities: 1+ edema bilateral LEs. Symmetric 5 x 5 power. Skin: No rashes, lesions or ulcers. Psychiatry: Judgement and insight appear normal. Mood & affect appropriate.   Data Reviewed: I have personally reviewed following labs and imaging studies  CBC: Recent Labs  Lab 08/02/21 1526 08/03/21 0517  WBC 6.2 6.8  NEUTROABS 4.0  --   HGB 12.7* 12.2*  HCT 38.5* 37.1*  MCV 96.3 95.9  PLT 161 195    Basic Metabolic Panel: Recent Labs  Lab 08/02/21 1526 08/03/21 0517 08/05/21 0509  NA 139 139 138  K 3.6 3.8 4.0  CL 105 105 103  CO2 29 26 25   GLUCOSE 159* 134* 129*  BUN <5* 16 18  CREATININE 1.29* 1.19 1.54*  CALCIUM 8.5* 8.6* 8.7*  PHOS  --   --  3.8    CBG: Recent Labs  Lab 08/03/21 0730 08/04/21 0455 08/04/21 0708 08/05/21 0451 08/05/21 0807  GLUCAP 120*  129* 120* 155* 113*    Recent Results (from the past 240 hour(s))  Resp Panel by RT-PCR (Flu A&B, Covid) Nasopharyngeal Swab     Status: None   Collection Time: 08/02/21  3:21 PM   Specimen: Nasopharyngeal Swab; Nasopharyngeal(NP) swabs in vial transport medium  Result Value Ref Range Status   SARS Coronavirus 2 by RT PCR NEGATIVE NEGATIVE Final    Comment: (NOTE) SARS-CoV-2 target nucleic acids are NOT DETECTED.  The SARS-CoV-2 RNA is generally detectable in upper respiratory specimens during the acute phase of infection. The lowest concentration of SARS-CoV-2 viral copies this assay can detect is 138 copies/mL. A negative result does not preclude SARS-Cov-2 infection and should not be used as the sole basis for treatment or other patient management decisions. A negative result may occur with  improper specimen collection/handling, submission of specimen other than nasopharyngeal swab, presence of viral mutation(s) within the areas targeted by this assay, and inadequate number of viral copies(<138 copies/mL). A negative result must be combined with clinical observations, patient history, and epidemiological information. The expected result is Negative.  Fact Sheet for Patients:  EntrepreneurPulse.com.au  Fact Sheet for Healthcare Providers:  IncredibleEmployment.be  This test is no t yet approved or cleared by the [place] FDA and  has been authorized for detection and/or diagnosis of SARS-CoV-2 by FDA under an Emergency Use Authorization (EUA). This EUA will remain  in effect (meaning this test can be used) for the duration of the COVID-19 declaration under Section 564(b)(1) of the Act, 21 U.S.C.section 360bbb-3(b)(1), unless the authorization is terminated  or revoked sooner.       Influenza A by PCR NEGATIVE NEGATIVE Final   Influenza B by PCR NEGATIVE NEGATIVE Final    Comment: (NOTE) The Xpert Xpress SARS-CoV-2/FLU/RSV plus  assay is intended as an aid in the diagnosis of influenza from Nasopharyngeal swab specimens and should not be used as a sole basis for treatment. Nasal washings and aspirates are unacceptable for Xpert Xpress SARS-CoV-2/FLU/RSV testing.  Fact Sheet for Patients: EntrepreneurPulse.com.au  Fact Sheet for Healthcare Providers: IncredibleEmployment.be  This test is not yet approved or cleared by the [place] FDA and has been authorized for detection and/or diagnosis of SARS-CoV-2 by FDA under an Emergency Use Authorization (EUA). This EUA will remain in effect (meaning this test can be used) for the duration of the COVID-19 declaration under Section 564(b)(1) of the Act, 21 U.S.C. section 360bbb-3(b)(1), unless the authorization is terminated or revoked.  Performed at [facility], [address], [place], [place] [address]   Culture, body fluid w Gram Stain-bottle     Status: None (Preliminary result)   Collection Time: 08/04/21  1:30 PM   Specimen: Pleura  Result Value Ref Range Status   Specimen Description PLEURAL  Final   Special Requests   Final    BOTTLES DRAWN AEROBIC AND ANAEROBIC Blood Culture results may not be optimal due to an excessive volume of blood received in culture bottles   Culture   Final    NO GROWTH < 24 HOURS Performed at [facility], [address], [place], [place] [address]    Report Status PENDING  Incomplete  Gram stain     Status: None   Collection Time: 08/04/21  1:30 PM   Specimen: Pleura  Result Value Ref Range Status   Specimen Description PLEURAL  Final   Special Requests PLEURAL  Final   Gram Stain   Final    NO ORGANISMS SEEN WBC PRESENT,BOTH PMN AND MONONUCLEAR CYTOSPIN SMEAR Performed at [facility], [address], [place], [place] [address]    Report Status 08/04/2021 FINAL  Final     Radiology Studies: DG Chest 1 View  Result Date: 08/04/2021 CLINICAL DATA:  Pleural effusions, status post  left thoracentesis. EXAM: CHEST  1 VIEW COMPARISON:  08/04/2021 at 6:14 a.m. FINDINGS: Extra tori frontal view of the chest image straits substantial reduction in the size of the left pleural effusion. No visible pneumothorax. Mild blunting of the right costophrenic angle compatible with right pleural effusion. Borderline enlargement of the cardiopericardial silhouette. Atherosclerotic calcification of the aortic arch. Emphysema noted. IMPRESSION: 1. Substantial reduction in size of the left pleural effusion, status post left thoracentesis. No pneumothorax identified. 2. Stable right pleural effusion. 3. Borderline enlargement of the cardiopericardial silhouette. 4. Aortic Atherosclerosis (ICD10-I70.0) and Emphysema (ICD10-J43.9). Electronically Signed   By: Van Clines M.D.   On: 08/04/2021 14:15   DG Chest 2 View  Result Date: 08/04/2021 CLINICAL DATA:  Increased sob/a fib/asthma/diabetic/htn/ex smoker/bilateral pleural effusion EXAM: CHEST - 2 VIEW COMPARISON:  08/02/2021 FINDINGS:  Slight improvement in the interstitial edema or infiltrates since prior study, with persistent coarse interstitial markings throughout. Moderate left effusion with consolidation/atelectasis at the left base as before. Small right effusion stable. Heart size difficult to assess due to adjacent opacities. Aortic Atherosclerosis (ICD10-170.0). No pneumothorax. Visualized bones unremarkable. Left upper abdominal embolization coils. IMPRESSION: 1. Improving interstitial infiltrates or edema. 2. Persistent moderate left and small right pleural effusions. Electronically Signed   By: Lucrezia Europe M.D.   On: 08/04/2021 07:09   US THORACENTESIS ASP PLEURAL SPACE W/IMG GUIDE  Result Date: 08/04/2021 INDICATION: Bilateral pleural effusions, left greater than right. EXAM: ULTRASOUND GUIDED LEFT THORACENTESIS MEDICATIONS: None. COMPLICATIONS: None immediate. PROCEDURE: An ultrasound guided thoracentesis was thoroughly discussed with the  patient and questions answered. The benefits, risks (including hemorrhage, infection, and pneumothorax, among others), alternatives and complications were also discussed. The patient understands and wishes to proceed with the procedure. Written consent was obtained. Standard time-out was performed. Ultrasound was performed to localize and mark an adequate pocket of fluid in the left chest. The area was then prepped and draped in the normal sterile fashion. 1% Lidocaine was used for local anesthesia. Under ultrasound guidance a Safe-T-Centesis type catheter was introduced. Thoracentesis was performed. The catheter was removed and a dressing applied. Postprocedural expiratory radiograph demonstrated no pneumothorax. FINDINGS: A total of approximately 1250 of serosanguineous fluid was removed. Samples were sent to the laboratory as requested by the clinical team. IMPRESSION: Successful ultrasound guided left thoracentesis yielding 1250 of pleural fluid. Electronically Signed   By: Van Clines M.D.   On: 08/04/2021 14:20    Scheduled Meds:  aspirin EC  81 mg Oral Q breakfast   enoxaparin (LOVENOX) injection  40 mg Subcutaneous Q24H   [START ON 08/07/2021] furosemide  40 mg Intravenous Daily   lactulose  30 g Oral TID   levofloxacin  500 mg Oral QHS   levothyroxine  137 mcg Oral Q0600   metoCLOPramide  5 mg Oral TID AC   metoprolol tartrate  25 mg Oral QHS   metoprolol tartrate  37.5 mg Oral Daily   pantoprazole  40 mg Oral BID   rifaximin  550 mg Oral BID   spironolactone  12.5 mg Oral BID   traZODone  50 mg Oral QHS   Continuous Infusions:   LOS: 3 days   Roxan Hockey, MD How to contact the [facility] Attending or Consulting provider [place] or covering provider during after hours [place], for this patient?  Check the care team in [facility] and look for a) attending/consulting [facility] provider listed and b) the [facility] team listed Log into [email or web address] and use [place]'s universal password to access. If  you do not have the password, please contact the hospital operator. Locate the [facility] provider you are looking for under [facility] and page to a number that you can be directly reached. If you still have difficulty reaching the provider, please page the [facility] (Director on Call) for the Hospitalists listed on amion for assistance.  08/05/2021, 7:04 PM

## 2021-08-06 ENCOUNTER — Inpatient Hospital Stay (HOSPITAL_COMMUNITY): Payer: Medicare Other

## 2021-08-06 ENCOUNTER — Encounter (HOSPITAL_COMMUNITY): Payer: Self-pay | Admitting: Internal Medicine

## 2021-08-06 LAB — AMYLASE, BODY FLUID (OTHER): Amylase, Body Fluid: 18 U/L

## 2021-08-06 LAB — CBC
HCT: 36.4 % — ABNORMAL LOW (ref 39.0–52.0)
Hemoglobin: 12 g/dL — ABNORMAL LOW (ref 13.0–17.0)
MCH: 31.4 pg (ref 26.0–34.0)
MCHC: 33 g/dL (ref 30.0–36.0)
MCV: 95.3 fL (ref 80.0–100.0)
Platelets: 122 10*3/uL — ABNORMAL LOW (ref 150–400)
RBC: 3.82 MIL/uL — ABNORMAL LOW (ref 4.22–5.81)
RDW: 16.9 % — ABNORMAL HIGH (ref 11.5–15.5)
WBC: 5.5 10*3/uL (ref 4.0–10.5)
nRBC: 0 % (ref 0.0–0.2)

## 2021-08-06 LAB — GLUCOSE, CAPILLARY: Glucose-Capillary: 136 mg/dL — ABNORMAL HIGH (ref 70–99)

## 2021-08-06 LAB — RENAL FUNCTION PANEL
Albumin: 2.1 g/dL — ABNORMAL LOW (ref 3.5–5.0)
Anion gap: 7 (ref 5–15)
BUN: 17 mg/dL (ref 8–23)
CO2: 29 mmol/L (ref 22–32)
Calcium: 9 mg/dL (ref 8.9–10.3)
Chloride: 101 mmol/L (ref 98–111)
Creatinine, Ser: 1.4 mg/dL — ABNORMAL HIGH (ref 0.61–1.24)
GFR, Estimated: 51 mL/min — ABNORMAL LOW (ref >60)
Glucose, Bld: 170 mg/dL — ABNORMAL HIGH (ref 70–99)
Phosphorus: 3.6 mg/dL (ref 2.5–4.6)
Potassium: 3.8 mmol/L (ref 3.5–5.1)
Sodium: 137 mmol/L (ref 135–145)

## 2021-08-06 MED ORDER — LEVOFLOXACIN 500 MG PO TABS: 500.0000 mg | ORAL_TABLET | Freq: Once | ORAL | Status: DC

## 2021-08-06 MED ORDER — FUROSEMIDE 10 MG/ML IJ SOLN
20.0000 mg | Freq: Every day | INTRAMUSCULAR | Status: DC
  Administered 2021-08-06: 20 mg via INTRAVENOUS
  Filled 2021-08-06: qty 2

## 2021-08-06 MED ORDER — FUROSEMIDE 40 MG PO TABS: 40.0000 mg | ORAL_TABLET | ORAL | 2 refills | Status: DC

## 2021-08-06 MED ORDER — SPIRONOLACTONE 25 MG PO TABS: 12.5000 mg | ORAL_TABLET | Freq: Two times a day (BID) | ORAL | 2 refills | Status: AC

## 2021-08-06 MED ORDER — LEVOFLOXACIN 750 MG PO TABS
750.0000 mg | ORAL_TABLET | Freq: Once | ORAL | Status: AC
  Administered 2021-08-06: 750 mg via ORAL
  Filled 2021-08-06: qty 1

## 2021-08-06 NOTE — Progress Notes (Signed)
Brought to [place] Rm per stretcher in no acute distress. Procedure explained to pt, consent obtained. Placed upright over bedside table. Prepped and draped in sterile manner. Lidocaine admin a L post thorax. Catheter introduced into thorax with return of clear serous fluid. 1.1 L fluid collected. Catheter withdrawn, site bandaged. No obvious bleeding of hematoma present.

## 2021-08-06 NOTE — Progress Notes (Signed)
Physical Therapy Treatment Patient Details Name: JRUE JARRIEL MRN: [id number] DOB: 13-Jun-1940 Today's Date: 08/06/2021   History of Present Illness Khoen Genet Friesen is a 81 y.o. male with history significant for multiple medical problems including atrial fibrillation, hypertension, diabetes, [name] liver cirrhosis, COPD, GI bleed, portal vein thrombosis, hepatic encephalopathy TIPs, CHF.  Patient presented to ED with complaints of difficulty breathing of 1 week duration, especially with exertion, just walking down his hallway in his house, and would improve with rest.  Spouse is at bedside and assist with the history, she reports they have been checking his weight, and its been increasing by 1 pound every day.  He called his outpatient provider, he was initially started on Lasix 40 mg daily and increased to twice daily without significant improvement in symptoms.    Patient also reports tightness in his thighs and some abdominal bloating.  He denies cough.  No chest pain.    PT Comments    Patient seated in chair at beginning of session with wife present. Patient does not require assist to transfer to standing without AD but is unsteady reaching for objects for support. He ambulates with trunk flexed and unsteady cadence without AD with intermittent reaching for  walls for balance. He returns to room and completes seated exercises. Patient will benefit from continued skilled physical therapy in hospital and recommended venue below to increase strength, balance, endurance for safe ADLs and gait.   Recommendations for follow up therapy are one component of a multi-disciplinary discharge planning process, led by the attending physician.  Recommendations may be updated based on patient status, additional functional criteria and insurance authorization.  Follow Up Recommendations  Home health PT     Assistance Recommended at Discharge Intermittent Supervision/Assistance  Patient can return home  with the following A little help with walking and/or transfers;A little help with bathing/dressing/bathroom;Help with stairs or ramp for entrance   Equipment Recommendations  None recommended by PT    Recommendations for Other Services       Precautions / Restrictions Precautions Precautions: Fall Restrictions Weight Bearing Restrictions: No     Mobility  Bed Mobility               General bed mobility comments: seated in chair    Transfers Overall transfer level: Modified independent Equipment used: None               General transfer comment: takes extra time, unsteady upon standing    Ambulation/Gait Ambulation/Gait assistance: Min guard Gait Distance (Feet): 50 Feet Assistive device: None   Gait velocity: decreased     General Gait Details: slower than normal gait speed; forward flexed trunk, intermittent unsteadiness reaching to walls for support   Stairs             Wheelchair Mobility    Modified Rankin (Stroke Patients Only)       Balance Overall balance assessment: Needs assistance Sitting-balance support: No upper extremity supported, Feet supported Sitting balance-Leahy Scale: Good     Standing balance support: No upper extremity supported Standing balance-Leahy Scale: Fair                              Cognition Arousal/Alertness: Awake/alert Behavior During Therapy: WFL for tasks assessed/performed Overall Cognitive Status: Within Functional Limits for tasks assessed  Exercises General Exercises - Lower Extremity Long Arc Quad: AROM, Both, 20 reps, Seated Hip Flexion/Marching: AROM, Both, 20 reps, Seated Toe Raises: AROM, Both, 20 reps, Seated Heel Raises: AROM, Both, 20 reps, Seated    General Comments        Pertinent Vitals/Pain Pain Assessment Pain Assessment: No/denies pain    Home Living                          Prior  Function            PT Goals (current goals can now be found in the care plan section) Acute Rehab PT Goals Patient Stated Goal: return home PT Goal Formulation: With patient/family Time For Goal Achievement: 08/18/21 Potential to Achieve Goals: Good Progress towards PT goals: Progressing toward goals    Frequency    Min 2X/week      PT Plan Current plan remains appropriate    Co-evaluation              AM-PAC PT "6 Clicks" Mobility   Outcome Measure  Help needed turning from your back to your side while in a flat bed without using bedrails?: None Help needed moving from lying on your back to sitting on the side of a flat bed without using bedrails?: None Help needed moving to and from a bed to a chair (including a wheelchair)?: A Little Help needed standing up from a chair using your arms (e.g., wheelchair or bedside chair)?: A Little Help needed to walk in hospital room?: A Little Help needed climbing 3-5 steps with a railing? : A Lot 6 Click Score: 19    End of Session Equipment Utilized During Treatment: Oxygen Activity Tolerance: Patient tolerated treatment well Patient left: in chair;with family/visitor present;with call bell/phone within reach Nurse Communication: Mobility status PT Visit Diagnosis: Muscle weakness (generalized) (M62.81)     Time: 3300-7622 PT Time Calculation (min) (ACUTE ONLY): 15 min  Charges:  $Therapeutic Activity: 8-22 mins                     1:45 PM, 08/06/21 Mearl Latin PT, DPT Physical Therapist at [facility]

## 2021-08-06 NOTE — Care Management Important Message (Signed)
Important Message  Patient Details  Name: Tyler Farmer MRN: [id number] Date of Birth: February 18, 1941   Medicare Important Message Given:  Yes     [name] 08/06/2021, 12:36 PM

## 2021-08-06 NOTE — Discharge Summary (Signed)
Tyler Farmer, is a 81 y.o. male  DOB 11/11/1940  MRN [id number].  Admission date:  08/02/2021  Admitting Physician  Bethena Roys, MD  Discharge Date:  08/06/2021   Primary MD  Quintin Alto, Silvestre Moment, MD  Recommendations for primary care physician for things to follow:   1) your fluid medications/diuretics have been adjusted--- please take furosemide/Lasix Take 60 mg every Morning and 20 mg every evening -Please take Aldactone/spironolactone 12.5 mg twice daily 2) you have recurrent pleural effusion/fluid around your lungs----one of the test shows that you have fluid has high LDH levels--- please follow-up with pulmonologist Dr. Melvyn Novas for evaluation of possible exudative pleural effusion and need to eliminate possibility of underlying cancer-- Pulmonologist in [place]-  -Address: [address] [place], [place] [address] Phone: [phone] OR call [phone] -Patient needs to be seen in the [place] office, Not in [place] 3)Very low-salt diet advised 4)Weigh yourself daily, call if you gain more than 3 pounds in 1 day or more than 5 pounds in 1 week as your diuretic medications may need to be adjusted 5)you need oxygen at home at 2 to 3 L via nasal cannula continuously while awake and while asleep--- smoking or having open fires around oxygen can cause fire, significant injury and death 6)Avoid ibuprofen/Advil/Aleve/Motrin/Goody Powders/Naproxen/BC powders/Meloxicam/Diclofenac/Indomethacin and other Nonsteroidal anti-inflammatory medications as these will make you more likely to bleed and can cause stomach ulcers, can also cause Kidney problems.  7) repeat CBC and BMP blood test and Repeat Chest Xray advised in a week  Admission Diagnosis  Pleural effusion [J90] Bilateral pleural effusion [J90] Community acquired pneumonia, unspecified laterality [J18.9]   Discharge Diagnosis  Pleural effusion  [J90] Bilateral pleural effusion [J90] Community acquired pneumonia, unspecified laterality [J18.9]    Principal Problem:   Acute diastolic CHF (congestive heart failure) (HCC) Active Problems:   Community Acquired Pneumonia   Acute respiratory failure with hypoxia (HCC)   HTN (hypertension)   DM (diabetes mellitus) ([place])   Liver cirrhosis secondary to NASH ([place])   Portal hypertension (HCC)   Atrial fibrillation, chronic (HCC)   Large bilateral pleural effusions      Past Medical History:  Diagnosis Date   A-fib ([place])    AKI (acute kidney injury) ([place]) 01/27/2021   Arthritis    Hands/Fingers   Asthma    Back pain    Cirrhosis of liver (HCC)    Diabetes mellitus ([place])    Enlarged prostate    Gastric ulcer    GERD (gastroesophageal reflux disease)    H/O bladder problems    History of kidney stones    Hypercholesteremia    Hypertension    Prostate cancer ([place])    Thyroid disease    UGI bleed 10/26/2016   Vitamin B12 deficiency 10/26/2016    Past Surgical History:  Procedure Laterality Date   CIRCUMCISION     at age 62   COLONOSCOPY N/A 11/13/2015   Procedure: COLONOSCOPY;  Surgeon: Rogene Houston, MD;  [facility];  Service:  Endoscopy;  Laterality: N/A;  2:10   COLONOSCOPY WITH PROPOFOL N/A 06/17/2020   Procedure: COLONOSCOPY WITH PROPOFOL;  Surgeon: Rogene Houston, MD;  [facility];  Service: Endoscopy;  Laterality: N/A;  AM / patient had positive covid 05/25/20   ESOPHAGEAL BANDING N/A 02/13/2021   Procedure: ESOPHAGEAL BANDING;  Surgeon: Harvel Quale, MD;  [facility];  Service: Gastroenterology;  Laterality: N/A;   ESOPHAGOGASTRODUODENOSCOPY N/A 06/03/2014   Procedure: ESOPHAGOGASTRODUODENOSCOPY (EGD);  Surgeon: Rogene Houston, MD;  [facility];  Service: Endoscopy;  Laterality: N/A;  730   ESOPHAGOGASTRODUODENOSCOPY N/A 10/26/2016   Procedure: ESOPHAGOGASTRODUODENOSCOPY (EGD);  Surgeon: Rogene Houston, MD;  [facility];  Service: Endoscopy;  Laterality: N/A;   ESOPHAGOGASTRODUODENOSCOPY (EGD) WITH PROPOFOL N/A 06/17/2020   Procedure: ESOPHAGOGASTRODUODENOSCOPY (EGD) WITH PROPOFOL;  Surgeon: Rogene Houston, MD;  [facility];  Service: Endoscopy;  Laterality: N/A;   ESOPHAGOGASTRODUODENOSCOPY (EGD) WITH PROPOFOL N/A 02/13/2021   Procedure: ESOPHAGOGASTRODUODENOSCOPY (EGD) WITH PROPOFOL;  Surgeon: Harvel Quale, MD;  [facility];  Service: Gastroenterology;  Laterality: N/A;   ESOPHAGOGASTRODUODENOSCOPY (EGD) WITH PROPOFOL N/A 06/16/2021   Procedure: ESOPHAGOGASTRODUODENOSCOPY (EGD) WITH PROPOFOL;  Surgeon: Rogene Houston, MD;  [facility];  Service: Endoscopy;  Laterality: N/A;  910 ASA 3   Gastric Ulcer  1993   GIVENS CAPSULE STUDY N/A 10/28/2016   Procedure: GIVENS CAPSULE STUDY;  Surgeon: Rogene Houston, MD;  [facility];  Service: Endoscopy;  Laterality: N/A;   HERNIA REPAIR     IR EMBO VENOUS NOT HEMORR HEMANG  INC GUIDE ROADMAPPING  03/05/2021   IR INTRAVASCULAR ULTRASOUND NON CORONARY  03/05/2021   IR RADIOLOGIST EVAL & MGMT  02/26/2021   IR RADIOLOGIST EVAL & MGMT  05/04/2021   IR THROMBECT VENO MECH MOD SED  03/05/2021   IR TIPS  03/05/2021   IR US GUIDE VASC ACCESS RIGHT  03/05/2021   IR US GUIDE VASC ACCESS RIGHT  03/05/2021   POLYPECTOMY  11/13/2015   Procedure: POLYPECTOMY;  Surgeon: Rogene Houston, MD;  [facility];  Service: Endoscopy;;  colon   POLYPECTOMY  06/17/2020   Procedure: POLYPECTOMY;  Surgeon: Rogene Houston, MD;  [facility];  Service: Endoscopy;;  sigmoid   RADIOLOGY WITH ANESTHESIA N/A 03/05/2021   Procedure: RADIOLOGY WITH ANESTHESIA    I.R. Rise Paganini;  Surgeon: Suzette Battiest, MD;  Location: [facility];  Service: Radiology;  Laterality: N/A;   Right Elbow Right    A pin was put in     HPI  from the history and physical done on the day of admission:     Chief  Complaint: Difficulty breathing.   HPI: Tyler Farmer is a 81 y.o. male with history significant for multiple medical problems including atrial fibrillation, hypertension, diabetes, [name] liver cirrhosis, COPD, GI bleed, portal vein thrombosis, hepatic encephalopathy TIPs, CHF. Patient presented to ED with complaints of difficulty breathing of 1 week duration, especially with exertion, just walking down his hallway in his house, and would improve with rest.  Spouse is at bedside and assist with the history, she reports they have been checking his weight, and its been increasing by 1 pound every day.  He called his outpatient provider, he was initially started on Lasix 40 mg daily and increased to twice daily without significant improvement in symptoms.   Patient also reports tightness in his thighs and some abdominal  bloating.  He denies cough.  No chest pain.   ED Course: Temperature 97.8.  Heart rate 91-119.  Respiratory symptoms 17- 27.  O2 sats down to 88% placed on 3 L.  Blood pressure systolic ranging from 85-631.  O2 sats 91 to 98% on room air.  BNP 405.  Troponin 12 >> 11.  CTA chest negative for PE, shows large bilateral pleural effusion left greater than right, lower lobe consolidation left greater than right.  IV Lasix 40 mg x 1 given. IV Levaquin given.   Review of Systems: As per HPI all other systems reviewed and negative.   Hospital Course:     81 y.o. male with history significant for multiple medical problems including atrial fibrillation, hypertension, diabetes, [name] liver cirrhosis, COPD, GI bleed, portal vein thrombosis, hepatic encephalopathy TIPs, CHF.  Patient presented to ED with complaints of difficulty breathing of 1 week duration, especially with exertion, just walking down his hallway in his house, and would improve with rest.  Spouse is at bedside and assist with the history, she reports they have been checking his weight, and its been increasing by 1 pound every day.   He called his outpatient provider, he was initially started on Lasix 40 mg daily and increased to twice daily without significant improvement in symptoms.   Patient also reports tightness in his thighs and some abdominal bloating.  He denies cough.  No chest pain.   ED Course: Temperature 97.8.  Heart rate 91-119.  Respiratory symptoms 17- 27.  O2 sats down to 88% placed on 3 L.  Blood pressure systolic ranging from 49-702.  O2 sats 91 to 98% on room air.  BNP 405.  Troponin 12 >> 11.  CTA chest negative for PE, shows large bilateral pleural effusion left greater than right, lower lobe consolidation left greater than right.  IV Lasix 40 mg x 1 given.  IV Levaquin given.  08/03/2021: Pt feeling better with diuresis but still feels SOB. Continue current treatments.     Assessment and Plan:  *Acute on chronic diastolic CHF (congestive heart failure) (HCC) -Echo 03/2021, EF of 60 to 65% Respiratory status improving after thoracentesis with removal of 1.25 L of fluid from the left pleural space on 08/04/2021 -Did not attempt ReDS clip test on him due to large curvature of his thoracic spine.  -Patient required another left-sided thoracentesis with removal of 1.1 L of fluid on 08/06/2021 -Fluid protein is low however pleural fluid LDH is high--- there is concern for pneumonia which could explain this trend towards exudative fluid -Given recurrent nature of pleural fluid patient has been advised to follow-up with pulmonologist Dr. Melida Quitter further evaluation to exclude the possibility of underlying malignancy -Weight is down to 189 lb from 205 lb on admission -Treated with IV Lasix with discharge home on Lasix 60 mg qam and 20 mg daily   Acute on chronic respiratory failure with hypoxia ([place]) Likely secondary to large pleural effusions from decompensated CHF. Currently requiring 3 L of oxygen via nasal cannula Overall improving after serial thoracentesis on 08/04/2021  and again on 08/06/21 as outlined  above   AKI----acute kidney injury on CKD stage -3A -Creatinine trending back down at 1.40 -Lasix adjusted as above renally adjust medications, avoid nephrotoxic agents / dehydration  / hypotension   Community Acquired Pneumonia -Clinical picture more consistent with CHF than pneumonia -Repeat chest x-ray on 08/06/2021 as patient already had thoracentesis -Continue Levaquin for now   Large bilateral pleural effusions -- Left-sided  thoracentesis as outlined above #1 -Repeat chest x-ray in 1 week and follow-up with pulmonologist advised   Atrial fibrillation, chronic ([place]) Currently RATED CONTROLLED atrial fibrillation.  -  Not on anticoagulation due to history of GI bleed.   Portal hypertension (HCC) -- this is chronic secondary to his liver disease    Liver cirrhosis secondary to NASH [facility]) Mental status intact.   reports compliance with lactulose 45 mils TID and Xifaxan Lasix and Aldactone as above #1  Continue lactulose and Xifaxan -Keep appointment with Dr. Melony Overly for 08/09/2021   DM (diabetes mellitus) ([place]) -Diet controlled    HTN (hypertension) -Continue Lasix and Aldactone and metoprolol  25-37.5 twice daily    Code Status: Full  Family Communication: Updated patient's daughter Sherry/Wendy by phone -Updated patient's wife at bedside Disposition: Home with family  Procedures:  Thoracentesis Antimicrobials:  Levofloxacin 5/15>> through 08/06/2021  Discharge Condition: Stable, oxygen requirement is back to baseline  Follow UP   Follow-up Information     Tanda Rockers, MD. Schedule an appointment as soon as possible for a visit in 1 week(s).   Specialty: Pulmonary Disease Why: Recurrent pleural effusion with elevated LDH Contact information: [address] [address] [phone]                 Diet and Activity recommendation:  As advised  Discharge Instructions    Discharge Instructions     Call MD for:  difficulty  breathing, headache or visual disturbances   Complete by: As directed    Call MD for:  persistant dizziness or light-headedness   Complete by: As directed    Call MD for:  persistant nausea and vomiting   Complete by: As directed    Call MD for:  severe uncontrolled pain   Complete by: As directed    Call MD for:  temperature >100.4   Complete by: As directed    Diet - low sodium heart healthy   Complete by: As directed    Discharge instructions   Complete by: As directed    1) your fluid medications/diuretics have been adjusted--- please take furosemide/Lasix Take 60 mg every Morning and 20 mg every evening -Please take Aldactone/spironolactone 12.5 mg twice daily 2) you have recurrent pleural effusion/fluid around your lungs----one of the test shows that you have fluid has high LDH levels--- please follow-up with pulmonologist Dr. Melvyn Novas for evaluation of possible exudative pleural effusion and need to eliminate possibility of underlying cancer-- Pulmonologist in [place]-  -Address: [address] [place], [place] [address] Phone: [phone] OR call [phone] -Patient needs to be seen in the [place] office, Not in [place] 3)Very low-salt diet advised 4)Weigh yourself daily, call if you gain more than 3 pounds in 1 day or more than 5 pounds in 1 week as your diuretic medications may need to be adjusted 5)you need oxygen at home at 2 to 3 L via nasal cannula continuously while awake and while asleep--- smoking or having open fires around oxygen can cause fire, significant injury and death 6)Avoid ibuprofen/Advil/Aleve/Motrin/Goody Powders/Naproxen/BC powders/Meloxicam/Diclofenac/Indomethacin and other Nonsteroidal anti-inflammatory medications as these will make you more likely to bleed and can cause stomach ulcers, can also cause Kidney problems.  7) repeat CBC and BMP blood test and Repeat Chest Xray advised in a week   Increase activity slowly   Complete by: As directed           Discharge Medications     Allergies as of 08/06/2021  Reactions   Penicillins Nausea And Vomiting   Has patient had a PCN reaction causing immediate rash, facial/tongue/throat swelling, SOB or lightheadedness with hypotension: Yes Has patient had a PCN reaction causing severe rash involving mucus membranes or skin necrosis: No Has patient had a PCN reaction that required hospitalization: No Has patient had a PCN reaction occurring within the last 10 years: No If all of the above answers are "NO", then may proceed with Cephalosporin use. Patient states that it also caused pain in his sides.        Medication List     TAKE these medications    acetaminophen 500 MG tablet Commonly known as: TYLENOL Take 500 mg by mouth every 6 (six) hours as needed for mild pain.   aspirin EC 81 MG tablet Take 1 tablet (81 mg total) by mouth daily with breakfast. Swallow whole.   Cranberry 500 MG Tabs Take 500 mg by mouth daily.   ferrous sulfate 325 (65 FE) MG tablet Take 325 mg by mouth daily with breakfast.   furosemide 40 MG tablet Commonly known as: LASIX Take 1 tablet (40 mg total) by mouth See admin instructions. Take 60 mg every Morning and 20 mg every evening What changed:  when to take this additional instructions   glipiZIDE 10 MG tablet Commonly known as: GLUCOTROL Take 10 mg by mouth 2 (two) times daily before a meal.   guaiFENesin 600 MG 12 hr tablet Commonly known as: MUCINEX Take 600 mg by mouth 2 (two) times daily as needed for cough or to loosen phlegm.   lactulose 10 GM/15ML solution Commonly known as: CHRONULAC Take 45 mLs (30 g total) by mouth 3 (three) times daily.   levothyroxine 137 MCG tablet Commonly known as: SYNTHROID Take 137 mcg by mouth daily before breakfast.   magnesium oxide 400 MG tablet Commonly known as: MAG-OX Take 400 mg by mouth daily.   metoCLOPramide 5 MG tablet Commonly known as: Reglan Take 1 tablet (5 mg total) by  mouth 3 (three) times daily before meals.   metoprolol tartrate 25 MG tablet Commonly known as: LOPRESSOR Take 1.5 tablets (37.5 mg total) by mouth every morning. & 25 mg in the evening What changed:  how much to take additional instructions   ondansetron 4 MG tablet Commonly known as: ZOFRAN Take 1 tablet (4 mg total) by mouth every 8 (eight) hours as needed for nausea or vomiting.   OXYGEN Inhale 3 L into the lungs continuous.   pantoprazole 40 MG tablet Commonly known as: PROTONIX Take 1 tablet (40 mg total) by mouth 2 (two) times daily.   potassium chloride 10 MEQ tablet Commonly known as: KLOR-CON Take 10 mEq by mouth daily.   rifaximin 550 MG Tabs tablet Commonly known as: XIFAXAN Take 550 mg by mouth 2 (two) times daily.   simvastatin 40 MG tablet Commonly known as: ZOCOR Take 40 mg by mouth at bedtime.   spironolactone 25 MG tablet Commonly known as: ALDACTONE Take 0.5 tablets (12.5 mg total) by mouth 2 (two) times daily.        Major procedures and Radiology Reports - PLEASE review detailed and final reports for all details, in brief -   DG Chest 1 View  Result Date: 08/06/2021 CLINICAL DATA:  Pleural effusions, status post left thoracentesis. EXAM: CHEST  1 VIEW COMPARISON:  08/06/2021 at 6:16 a.m. FINDINGS: Substantial reduction in the left pleural effusion compared to the earlier exam. Small edge over the left lung apex is  attributed to the second rib on this apical lordotic projection, no definite pneumothorax. Atherosclerotic calcification of the aortic arch. Mild enlargement of the cardiopericardial silhouette. Stable mild interstitial accentuation. Minimal blunting of the right lateral costophrenic angle. IMPRESSION: 1. Substantially reduced left pleural effusion status post thoracentesis. No definite pneumothorax. 2. Mild enlargement of the cardiopericardial silhouette with mild interstitial accentuation. 3. Small right pleural effusion. Electronically  Signed   By: Van Clines M.D.   On: 08/06/2021 15:38   DG Chest 1 View  Result Date: 08/04/2021 CLINICAL DATA:  Pleural effusions, status post left thoracentesis. EXAM: CHEST  1 VIEW COMPARISON:  08/04/2021 at 6:14 a.m. FINDINGS: Extra tori frontal view of the chest image straits substantial reduction in the size of the left pleural effusion. No visible pneumothorax. Mild blunting of the right costophrenic angle compatible with right pleural effusion. Borderline enlargement of the cardiopericardial silhouette. Atherosclerotic calcification of the aortic arch. Emphysema noted. IMPRESSION: 1. Substantial reduction in size of the left pleural effusion, status post left thoracentesis. No pneumothorax identified. 2. Stable right pleural effusion. 3. Borderline enlargement of the cardiopericardial silhouette. 4. Aortic Atherosclerosis (ICD10-I70.0) and Emphysema (ICD10-J43.9). Electronically Signed   By: Van Clines M.D.   On: 08/04/2021 14:15   DG Chest 2 View  Result Date: 08/06/2021 CLINICAL DATA:  Cough and shortness of breath. Recent left-sided thoracentesis. EXAM: CHEST - 2 VIEW COMPARISON:  Chest x-ray dated Aug 04, 2021. FINDINGS: Unchanged mild cardiomegaly, pulmonary vascular congestion, and diffuse interstitial thickening. Increasing now moderate left pleural effusion with worsening aeration at the left lung base. Unchanged trace right pleural effusion. No pneumothorax. No acute osseous abnormality. IMPRESSION: 1. Increasing now moderate left pleural effusion with worsening aeration at the left lung base. Unchanged trace right pleural effusion. Electronically Signed   By: Titus Dubin M.D.   On: 08/06/2021 08:20   DG Chest 2 View  Result Date: 08/04/2021 CLINICAL DATA:  Increased sob/a fib/asthma/diabetic/htn/ex smoker/bilateral pleural effusion EXAM: CHEST - 2 VIEW COMPARISON:  08/02/2021 FINDINGS: Slight improvement in the interstitial edema or infiltrates since prior study, with  persistent coarse interstitial markings throughout. Moderate left effusion with consolidation/atelectasis at the left base as before. Small right effusion stable. Heart size difficult to assess due to adjacent opacities. Aortic Atherosclerosis (ICD10-170.0). No pneumothorax. Visualized bones unremarkable. Left upper abdominal embolization coils. IMPRESSION: 1. Improving interstitial infiltrates or edema. 2. Persistent moderate left and small right pleural effusions. Electronically Signed   By: Lucrezia Europe M.D.   On: 08/04/2021 07:09   CT Angio Chest PE W/Cm &/Or Wo Cm  Result Date: 08/02/2021 CLINICAL DATA:  Shortness of breath for 1 week EXAM: CT ANGIOGRAPHY CHEST WITH CONTRAST TECHNIQUE: Multidetector CT imaging of the chest was performed using the standard protocol during bolus administration of intravenous contrast. Multiplanar CT image reconstructions and MIPs were obtained to evaluate the vascular anatomy. RADIATION DOSE REDUCTION: This exam was performed according to the departmental dose-optimization program which includes automated exposure control, adjustment of the mA and/or kV according to patient size and/or use of iterative reconstruction technique. CONTRAST:  12m OMNIPAQUE IOHEXOL 350 MG/ML SOLN COMPARISON:  Plain film from earlier in the same day. FINDINGS: Cardiovascular: Thoracic aorta demonstrates atherosclerotic calcifications without aneurysmal dilatation or dissection. Heart is not significantly enlarged. Heavy coronary calcifications are noted. The pulmonary artery shows a normal branching pattern bilaterally without focal filling defect to suggest pulmonary embolism. Mediastinum/Nodes: Thoracic inlet is within normal limits. No hilar or mediastinal adenopathy is noted. The esophagus as visualized is  within normal limits. Lungs/Pleura: Lungs demonstrate bilateral pleural effusions left considerably greater than that on the right. Lower lobe consolidation is noted left greater than right as  well. Some atelectatic changes in the right upper lobe are seen. Diffuse emphysematous changes are seen. No discrete nodule is identified to correspond with that seen on recent plain film examination Upper Abdomen: TIPS stent is noted. Mild nodularity of the liver is noted. Stable renal cysts are noted on the right. No follow-up recommended. Musculoskeletal: Degenerative changes of the thoracic spine are seen. No acute rib abnormality is noted. Review of the MIP images confirms the above findings. IMPRESSION: No evidence of pulmonary emboli. Large bilateral pleural effusions left greater than right. Lower lobe consolidation left greater than right. No definitive nodule is identified in the right upper lobe correspond with that seen on prior plain film examination this likely represents confluence of parenchymal and bony shadows. Aortic Atherosclerosis (ICD10-I70.0) and Emphysema (ICD10-J43.9). Electronically Signed   By: Inez Catalina M.D.   On: 08/02/2021 20:57   DG Chest Port 1 View  Result Date: 08/02/2021 CLINICAL DATA:  Shortness of breath for a week. EXAM: PORTABLE CHEST 1 VIEW COMPARISON:  04/29/2021 and CT chest 04/12/2021. FINDINGS: Trachea is midline. Heart is enlarged. Thoracic aorta is calcified. Large left pleural effusion. Interstitial prominence and indistinctness bilaterally. Right perihilar nodule measures 11 mm, not well seen on the comparison exam. Moderate right pleural effusion with bibasilar collapse/consolidation. IMPRESSION: 1. Congestive heart failure. 2. New nodule in the right perihilar region. Consider nonemergent/outpatient CT chest without contrast in further evaluation, when current symptoms abate. 3. Bibasilar collapse/consolidation. Difficult to exclude pneumonia. Electronically Signed   By: Lorin Picket M.D.   On: 08/02/2021 13:29   US THORACENTESIS ASP PLEURAL SPACE W/IMG GUIDE  Result Date: 08/06/2021 INDICATION: Left-sided pleural fluid EXAM: ULTRASOUND GUIDED choose 1  THORACENTESIS MEDICATIONS: None. COMPLICATIONS: None immediate. PROCEDURE: An ultrasound guided thoracentesis was thoroughly discussed with the patient and questions answered. The benefits, risks, alternatives and complications were also discussed. The patient understands and wishes to proceed with the procedure. Written consent was obtained. Ultrasound was performed to localize and mark an adequate pocket of fluid in the left chest. The area was then prepped and draped in the normal sterile fashion. 1% Lidocaine was used for local anesthesia. Under ultrasound guidance a 19 gauge, 7-cm, Yueh catheter was introduced. Thoracentesis was performed. The catheter was removed and a dressing applied. FINDINGS: A total of approximately 1.1 L of serous, minimally sanguinous fluid was removed. IMPRESSION: Successful ultrasound guided left thoracentesis yielding 1.1 L of pleural fluid. Electronically Signed   By: Abigail Miyamoto M.D.   On: 08/06/2021 15:54   US THORACENTESIS ASP PLEURAL SPACE W/IMG GUIDE  Result Date: 08/04/2021 INDICATION: Bilateral pleural effusions, left greater than right. EXAM: ULTRASOUND GUIDED LEFT THORACENTESIS MEDICATIONS: None. COMPLICATIONS: None immediate. PROCEDURE: An ultrasound guided thoracentesis was thoroughly discussed with the patient and questions answered. The benefits, risks (including hemorrhage, infection, and pneumothorax, among others), alternatives and complications were also discussed. The patient understands and wishes to proceed with the procedure. Written consent was obtained. Standard time-out was performed. Ultrasound was performed to localize and mark an adequate pocket of fluid in the left chest. The area was then prepped and draped in the normal sterile fashion. 1% Lidocaine was used for local anesthesia. Under ultrasound guidance a Safe-T-Centesis type catheter was introduced. Thoracentesis was performed. The catheter was removed and a dressing applied. Postprocedural  expiratory radiograph demonstrated no pneumothorax. FINDINGS:  A total of approximately 1250 of serosanguineous fluid was removed. Samples were sent to the laboratory as requested by the clinical team. IMPRESSION: Successful ultrasound guided left thoracentesis yielding 1250 of pleural fluid. Electronically Signed   By: Van Clines M.D.   On: 08/04/2021 14:20    Micro Results  Recent Results (from the past 240 hour(s))  Resp Panel by RT-PCR (Flu A&B, Covid) Nasopharyngeal Swab     Status: None   Collection Time: 08/02/21  3:21 PM   Specimen: Nasopharyngeal Swab; Nasopharyngeal(NP) swabs in vial transport medium  Result Value Ref Range Status   SARS Coronavirus 2 by RT PCR NEGATIVE NEGATIVE Final    Comment: (NOTE) SARS-CoV-2 target nucleic acids are NOT DETECTED.  The SARS-CoV-2 RNA is generally detectable in upper respiratory specimens during the acute phase of infection. The lowest concentration of SARS-CoV-2 viral copies this assay can detect is 138 copies/mL. A negative result does not preclude SARS-Cov-2 infection and should not be used as the sole basis for treatment or other patient management decisions. A negative result may occur with  improper specimen collection/handling, submission of specimen other than nasopharyngeal swab, presence of viral mutation(s) within the areas targeted by this assay, and inadequate number of viral copies(<138 copies/mL). A negative result must be combined with clinical observations, patient history, and epidemiological information. The expected result is Negative.  Fact Sheet for Patients:  EntrepreneurPulse.com.au  Fact Sheet for Healthcare Providers:  IncredibleEmployment.be  This test is no t yet approved or cleared by the [place] FDA and  has been authorized for detection and/or diagnosis of SARS-CoV-2 by FDA under an Emergency Use Authorization (EUA). This EUA will remain  in effect (meaning  this test can be used) for the duration of the COVID-19 declaration under Section 564(b)(1) of the Act, 21 U.S.C.section 360bbb-3(b)(1), unless the authorization is terminated  or revoked sooner.       Influenza A by PCR NEGATIVE NEGATIVE Final   Influenza B by PCR NEGATIVE NEGATIVE Final    Comment: (NOTE) The Xpert Xpress SARS-CoV-2/FLU/RSV plus assay is intended as an aid in the diagnosis of influenza from Nasopharyngeal swab specimens and should not be used as a sole basis for treatment. Nasal washings and aspirates are unacceptable for Xpert Xpress SARS-CoV-2/FLU/RSV testing.  Fact Sheet for Patients: EntrepreneurPulse.com.au  Fact Sheet for Healthcare Providers: IncredibleEmployment.be  This test is not yet approved or cleared by the [place] FDA and has been authorized for detection and/or diagnosis of SARS-CoV-2 by FDA under an Emergency Use Authorization (EUA). This EUA will remain in effect (meaning this test can be used) for the duration of the COVID-19 declaration under Section 564(b)(1) of the Act, 21 U.S.C. section 360bbb-3(b)(1), unless the authorization is terminated or revoked.  Performed at [facility], [address], [place], [place] [address]   Culture, body fluid w Gram Stain-bottle     Status: None (Preliminary result)   Collection Time: 08/04/21  1:30 PM   Specimen: Pleura  Result Value Ref Range Status   Specimen Description PLEURAL  Final   Special Requests   Final    BOTTLES DRAWN AEROBIC AND ANAEROBIC Blood Culture results may not be optimal due to an excessive volume of blood received in culture bottles   Culture   Final    NO GROWTH 2 DAYS Performed at [facility], [address] [place], [place] [address]    Report Status PENDING  Incomplete  Gram stain     Status: None  Collection Time: 08/04/21  1:30 PM   Specimen: Pleura  Result Value Ref Range Status   Specimen Description PLEURAL  Final    Special Requests PLEURAL  Final   Gram Stain   Final    NO ORGANISMS SEEN WBC PRESENT,BOTH PMN AND MONONUCLEAR CYTOSPIN SMEAR Performed at [facility], [address] [place], [place] [address]    Report Status 08/04/2021 FINAL  Final    Today   Subjective    Tyler Farmer today has no new complaints No fever  Or chills   No Nausea, Vomiting or Diarrhea         Patient has been seen and examined prior to discharge   Objective   Blood pressure (!) 109/54, pulse (!) 102, temperature 97.7 F (36.5 C), resp. rate 16, height 5' 9"  (1.753 m), weight 86.1 kg, SpO2 92 %.   Intake/Output Summary (Last 24 hours) at 08/06/2021 1701 Last data filed at 08/05/2021 2149 Gross per 24 hour  Intake 240 ml  Output --  Net 240 ml    Exam Gen:- Awake Alert, no acute distress  HEENT:- [place].AT, No sclera icterus Nose- [place] 2L/min Neck-Supple Neck,No JVD,.  Lungs-improvement movement, no wheezing CV- S1, S2 normal, regular Abd-  +ve B.Sounds, Abd Soft, No tenderness,    Extremity/Skin:-Improved lower extremity edema,   good pulses Psych-affect is appropriate, oriented x3 Neuro-no new focal deficits, no tremors    Data Review   CBC w Diff:  Lab Results  Component Value Date   WBC 5.5 08/06/2021   HGB 12.0 (L) 08/06/2021   HCT 36.4 (L) 08/06/2021   PLT 122 (L) 08/06/2021   LYMPHOPCT 15 08/02/2021   MONOPCT 13 08/02/2021   EOSPCT 6 08/02/2021   BASOPCT 1 08/02/2021    CMP:  Lab Results  Component Value Date   NA 137 08/06/2021   K 3.8 08/06/2021   CL 101 08/06/2021   CO2 29 08/06/2021   BUN 17 08/06/2021   CREATININE 1.40 (H) 08/06/2021   CREATININE 1.37 (H) 07/27/2021   PROT 6.6 08/02/2021   ALBUMIN 2.1 (L) 08/06/2021   BILITOT 2.1 (H) 08/02/2021   ALKPHOS 190 (H) 08/02/2021   AST 33 08/02/2021   ALT 22 08/02/2021  .  Total Discharge time is about 33 minutes  Roxan Hockey M.D on 08/06/2021 at 5:01 PM  Go to [email or web address] -  for contact info  [facility]  - Office  [phone]

## 2021-08-06 NOTE — Progress Notes (Signed)
Nsg Discharge Note  Admit Date:  08/02/2021 Discharge date: 08/06/2021   Tyler Farmer to be D/C'd Home per MD order.  AVS completed.  Copy for chart, and copy for patient signed, and dated. Patient/caregiver able to verbalize understanding.  Discharge Medication: Allergies as of 08/06/2021       Reactions   Penicillins Nausea And Vomiting   Has patient had a PCN reaction causing immediate rash, facial/tongue/throat swelling, SOB or lightheadedness with hypotension: Yes Has patient had a PCN reaction causing severe rash involving mucus membranes or skin necrosis: No Has patient had a PCN reaction that required hospitalization: No Has patient had a PCN reaction occurring within the last 10 years: No If all of the above answers are "NO", then may proceed with Cephalosporin use. Patient states that it also caused pain in his sides.        Medication List     TAKE these medications    acetaminophen 500 MG tablet Commonly known as: TYLENOL Take 500 mg by mouth every 6 (six) hours as needed for mild pain.   aspirin EC 81 MG tablet Take 1 tablet (81 mg total) by mouth daily with breakfast. Swallow whole.   Cranberry 500 MG Tabs Take 500 mg by mouth daily.   ferrous sulfate 325 (65 FE) MG tablet Take 325 mg by mouth daily with breakfast.   furosemide 40 MG tablet Commonly known as: LASIX Take 1 tablet (40 mg total) by mouth See admin instructions. Take 60 mg every Morning and 20 mg every evening What changed:  when to take this additional instructions   glipiZIDE 10 MG tablet Commonly known as: GLUCOTROL Take 10 mg by mouth 2 (two) times daily before a meal.   guaiFENesin 600 MG 12 hr tablet Commonly known as: MUCINEX Take 600 mg by mouth 2 (two) times daily as needed for cough or to loosen phlegm.   lactulose 10 GM/15ML solution Commonly known as: CHRONULAC Take 45 mLs (30 g total) by mouth 3 (three) times daily.   levothyroxine 137 MCG tablet Commonly known as:  SYNTHROID Take 137 mcg by mouth daily before breakfast.   magnesium oxide 400 MG tablet Commonly known as: MAG-OX Take 400 mg by mouth daily.   metoCLOPramide 5 MG tablet Commonly known as: Reglan Take 1 tablet (5 mg total) by mouth 3 (three) times daily before meals.   metoprolol tartrate 25 MG tablet Commonly known as: LOPRESSOR Take 1.5 tablets (37.5 mg total) by mouth every morning. & 25 mg in the evening What changed:  how much to take additional instructions   ondansetron 4 MG tablet Commonly known as: ZOFRAN Take 1 tablet (4 mg total) by mouth every 8 (eight) hours as needed for nausea or vomiting.   OXYGEN Inhale 3 L into the lungs continuous.   pantoprazole 40 MG tablet Commonly known as: PROTONIX Take 1 tablet (40 mg total) by mouth 2 (two) times daily.   potassium chloride 10 MEQ tablet Commonly known as: KLOR-CON Take 10 mEq by mouth daily.   rifaximin 550 MG Tabs tablet Commonly known as: XIFAXAN Take 550 mg by mouth 2 (two) times daily.   simvastatin 40 MG tablet Commonly known as: ZOCOR Take 40 mg by mouth at bedtime.   spironolactone 25 MG tablet Commonly known as: ALDACTONE Take 0.5 tablets (12.5 mg total) by mouth 2 (two) times daily.        Discharge Assessment: Vitals:   08/06/21 1453 08/06/21 1456  BP: (!) 96/58 Marland Kitchen)  109/54  Pulse: 78 (!) 102  Resp: 16   Temp: 97.7 F (36.5 C)   SpO2: 90% 92%   Skin clean, dry and intact without evidence of skin break down, no evidence of skin tears noted. IV catheter discontinued intact. Site without signs and symptoms of complications - no redness or edema noted at insertion site, patient denies c/o pain - only slight tenderness at site.  Dressing with slight pressure applied.  D/c Instructions-Education: Discharge instructions given to patient/family with verbalized understanding. D/c education completed with patient/family including follow up instructions, medication list, d/c activities  limitations if indicated, with other d/c instructions as indicated by MD - patient able to verbalize understanding, all questions fully answered. Patient instructed to return to ED, call 911, or call MD for any changes in condition.  Patient escorted via [place], and D/C home via private auto.  Dorcas Mcmurray, LPN 09/21/5007 3:81 PM

## 2021-08-06 NOTE — Discharge Instructions (Addendum)
  1) your fluid medications/diuretics have been adjusted--- please take furosemide/Lasix Take 60 mg every Morning and 20 mg every evening -Please take Aldactone/spironolactone 12.5 mg twice daily 2) you have recurrent pleural effusion/fluid around your lungs----one of the test shows that you have fluid has high LDH levels--- please follow-up with pulmonologist Dr. Melvyn Novas for evaluation of possible exudative pleural effusion and need to eliminate possibility of underlying cancer-- Pulmonologist in [place]-  -Address: [address] [place],  [address] Phone: [phone] OR call [phone] -Patient needs to be seen in the [place] office, Not in [place] 3)Very low-salt diet advised 4)Weigh yourself daily, call if you gain more than 3 pounds in 1 day or more than 5 pounds in 1 week as your diuretic medications may need to be adjusted 5)you need oxygen at home at 2 to 3 L via nasal cannula continuously while awake and while asleep--- smoking or having open fires around oxygen can cause fire, significant injury and death 6)Avoid ibuprofen/Advil/Aleve/Motrin/Goody Powders/Naproxen/BC powders/Meloxicam/Diclofenac/Indomethacin and other Nonsteroidal anti-inflammatory medications as these will make you more likely to bleed and can cause stomach ulcers, can also cause Kidney problems.  7) repeat CBC and BMP blood test and Repeat Chest Xray advised in a week

## 2021-08-09 ENCOUNTER — Other Ambulatory Visit: Payer: Self-pay | Admitting: Cardiology

## 2021-08-09 LAB — CULTURE, BODY FLUID W GRAM STAIN -BOTTLE: Culture: NO GROWTH

## 2021-08-10 ENCOUNTER — Ambulatory Visit (INDEPENDENT_AMBULATORY_CARE_PROVIDER_SITE_OTHER): Payer: Medicare Other | Admitting: Internal Medicine

## 2021-08-10 ENCOUNTER — Encounter (INDEPENDENT_AMBULATORY_CARE_PROVIDER_SITE_OTHER): Payer: Self-pay | Admitting: Internal Medicine

## 2021-08-10 VITALS — BP 125/85 | HR 130 | Temp 97.8°F | Ht 69.0 in | Wt 195.4 lb

## 2021-08-10 DIAGNOSIS — K7682 Hepatic encephalopathy: Secondary | ICD-10-CM

## 2021-08-10 DIAGNOSIS — K7581 Nonalcoholic steatohepatitis (NASH): Secondary | ICD-10-CM | POA: Diagnosis not present

## 2021-08-10 DIAGNOSIS — K7460 Unspecified cirrhosis of liver: Secondary | ICD-10-CM

## 2021-08-10 DIAGNOSIS — D649 Anemia, unspecified: Secondary | ICD-10-CM

## 2021-08-10 MED ORDER — FUROSEMIDE 20 MG PO TABS: 40.0000 mg | ORAL_TABLET | Freq: Two times a day (BID) | ORAL | 1 refills | Status: AC

## 2021-08-10 NOTE — Progress Notes (Signed)
Presenting complaint;  Follow-up for chronic liver disease.  Database and subjective:  Patient is 81 year old Caucasian male who has a history of cirrhosis secondary to [place] with multiple complications who is here for scheduled visit.  He is accompanied by his wife Inez Catalina.  He was last seen by me on 06/08/2021. His disease has been complicated by hepatic encephalopathy esophageal varices with spontaneous splenorenal shunt and he also developed portal vein thrombosis with worsening hepatic encephalopathy leading to placement of TIPS and portal thrombectomy along with splenorenal shunt embolization in December 2022.  Around the same time he was also diagnosed with gastric ulcer.  Revealed large amount of food debris in the stomach.  While examination was incomplete no ulcer was noted.  He was confirmed to have gastroparesis and has been maintained on low-dose metoclopramide. Patient was admitted to [facility] on 08/02/2021 for shortness of breath.  He was found to have left lower lobe pneumonia and bilateral effusions.  He underwent bilateral thoracenteses.  He had right thoracenteses on 08/04/2021 with removal of 1250 mL of fluid and left thoracenteses on 08/06/2021 with removal of 1100 mL of fluid. Patient was discharged on 08/06/2021.  He remains on O2.  He is not having shortness of breath anymore.   He states his appetite remains very good.  He is having 2 bowel movements per day.  He denies abdominal pain nausea vomiting melena or rectal bleeding.  He feels heartburn is well controlled with medication.  He is not having any side effects with metoclopramide. He has not taken ondansetron in more than 4 weeks.  Patient's wife request that furosemide prescription be changed to 20 mg tablets as she is having difficulty cutting 40 mg tablets into half.  He is taking 60 mg in the morning and 20 in the evening.  Current Medications: Outpatient Encounter Medications as of 08/10/2021  Medication Sig    acetaminophen (TYLENOL) 500 MG tablet Take 500 mg by mouth every 6 (six) hours as needed for mild pain.   aspirin EC 81 MG tablet Take 1 tablet (81 mg total) by mouth daily with breakfast. Swallow whole.   Cranberry 500 MG TABS Take 500 mg by mouth daily.   ferrous sulfate 325 (65 FE) MG tablet Take 325 mg by mouth daily with breakfast.   furosemide (LASIX) 40 MG tablet Take 1 tablet (40 mg total) by mouth See admin instructions. Take 60 mg every Morning and 20 mg every evening   glipiZIDE (GLUCOTROL) 10 MG tablet Take 10 mg by mouth 2 (two) times daily before a meal.   guaiFENesin (MUCINEX) 600 MG 12 hr tablet Take 600 mg by mouth 2 (two) times daily as needed for cough or to loosen phlegm.   lactulose (CHRONULAC) 10 GM/15ML solution Take 45 mLs (30 g total) by mouth 3 (three) times daily.   levothyroxine (SYNTHROID) 137 MCG tablet Take 137 mcg by mouth daily before breakfast.   magnesium oxide (MAG-OX) 400 MG tablet Take 400 mg by mouth daily.   metoCLOPramide (REGLAN) 5 MG tablet Take 1 tablet (5 mg total) by mouth 3 (three) times daily before meals.   metoprolol tartrate (LOPRESSOR) 25 MG tablet Take 1.5 tablets (37.5 mg total) by mouth every morning. & 25 mg in the evening (Patient taking differently: Take 25-37.5 mg by mouth every morning. 37.1m in the morning & 25 mg in the evening)   ondansetron (ZOFRAN) 4 MG tablet Take 1 tablet (4 mg total) by mouth every 8 (eight) hours as  needed for nausea or vomiting.   OXYGEN Inhale 3 L into the lungs continuous.   pantoprazole (PROTONIX) 40 MG tablet Take 1 tablet (40 mg total) by mouth 2 (two) times daily.   potassium chloride (KLOR-CON) 10 MEQ tablet Take 10 mEq by mouth daily.   rifaximin (XIFAXAN) 550 MG TABS tablet Take 550 mg by mouth 2 (two) times daily.   simvastatin (ZOCOR) 40 MG tablet Take 40 mg by mouth at bedtime.    spironolactone (ALDACTONE) 25 MG tablet Take 0.5 tablets (12.5 mg total) by mouth 2 (two) times daily.   No  facility-administered encounter medications on file as of 08/10/2021.     Objective: Blood pressure 125/85, pulse (!) 120, temperature 97.8 F (36.6 C), temperature source Oral, height _0  (1.753 m), weight 195 lb 6.4 oz (88.6 kg). Patient is alert and in no acute distress. He does not have asterixis or tremors. He is sitting in a wheelchair and on nasal O2. Conjunctiva is pink. Sclera is nonicteric Oropharyngeal mucosa is normal. He has few remaining teeth. No neck masses or thyromegaly noted. Cardiac exam with irregular rhythm normal S1 and S2. No murmur or gallop noted. Lungs are clear to auscultation. Abdomen is symmetrical with upper midline scar.  Firmness noted in the epigastric region felt to be due to calcified scar.  Abdomen is otherwise soft and nontender with organomegaly or masses. He has trace edema around ankles.  Labs/studies Results:      Latest Ref Rng & Units 08/06/2021    4:52 AM 08/03/2021    5:17 AM 08/02/2021    3:26 PM  CBC  WBC 4.0 - 10.5 K/uL 5.5   6.8   6.2    Hemoglobin 13.0 - 17.0 g/dL 12.0   12.2   12.7    Hematocrit 39.0 - 52.0 % 36.4   37.1   38.5    Platelets 150 - 400 K/uL 122   176   161         Latest Ref Rng & Units 08/06/2021    4:52 AM 08/05/2021    5:09 AM 08/03/2021    5:17 AM  CMP  Glucose 70 - 99 mg/dL 170   129   134    BUN 8 - 23 mg/dL _1 Creatinine 0.61 - 1.24 mg/dL 1.40   1.54   1.19    Sodium 135 - 145 mmol/L 137   138   139    Potassium 3.5 - 5.1 mmol/L 3.8   4.0   3.8    Chloride 98 - 111 mmol/L 101   103   105    CO2 22 - 32 mmol/L _2 Calcium 8.9 - 10.3 mg/dL 9.0   8.7   8.6         Latest Ref Rng & Units 08/06/2021    4:52 AM 08/05/2021    5:09 AM 08/02/2021    3:26 PM  Hepatic Function  Total Protein 6.5 - 8.1 g/dL   6.6    Albumin 3.5 - 5.0 g/dL 2.1   2.2   2.6    AST 15 - 41 U/L   33    ALT 0 - 44 U/L   22    Alk Phosphatase 38 - 126 U/L   190    Total Bilirubin 0.3 - 1.2 mg/dL   2.1        Assessment:  #  1.  Hepatic encephalopathy.  He appears to be at baseline.  We will continue lactulose and Xifaxan.  #2.  Gastroparesis.  He is on low-dose metoclopramide which she is tolerating well.  I would like for him to try decreasing dose to 5 mg twice a day and hopefully this should be enough.  #3.  History of gastric ulcer.  EGD about 7 weeks ago was incomplete because of food debris in the stomach.  We will continue double dose PPI for now.  #4.  Recent hospitalization for pneumonia and bilateral pleural effusions requiring thoracenteses on each side.  I suspect pleural effusion secondary to pneumonia.  I do not believe pleural effusion secondary to chronic liver disease. Patient remains on diuretic therapy.  We will need to monitor for hypokalemia and renal function closely.  #5.  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  #6.  Tachyarrhythmia.  Patient's heart rate is around 120/min.  He has known atrial fibrillation.  It turns out that he is not taking metoprolol.  He is tolerating tachyarrhythmia without any postural symptoms or hypotension.   Plan:  Decrease metoclopramide to 5 mg by mouth 30 minutes before breakfast and 5 mg 30 minutes before evening meal.  He can go back to taking it 3 times a day if he develops early satiety postprandial regurgitation or emesis. Continue pantoprazole, lactulose and Xifaxan as before. Patient advised to go back on metoprolol as soon as he gets home.  He takes 37.5 mg in the morning and 25 mg in the evening.  His wife will keep an eye on his heart rate and notify cardiology if his heart rate does not drop below 100. Patient will go to the lab for CBC and metabolic 7 on 2/48/2500. Office visit in 1 month.

## 2021-08-10 NOTE — Patient Instructions (Signed)
Resume metoprolol as soon as possible at prior dose. Please check heart rate at least twice a day for the next 2 days. Physician will call with results of blood work to be done on 08/12/2021.

## 2021-08-11 DIAGNOSIS — N179 Acute kidney failure, unspecified: Secondary | ICD-10-CM | POA: Diagnosis not present

## 2021-08-11 DIAGNOSIS — K7581 Nonalcoholic steatohepatitis (NASH): Secondary | ICD-10-CM | POA: Diagnosis not present

## 2021-08-11 DIAGNOSIS — K7290 Hepatic failure, unspecified without coma: Secondary | ICD-10-CM | POA: Diagnosis not present

## 2021-08-11 DIAGNOSIS — I8500 Esophageal varices without bleeding: Secondary | ICD-10-CM | POA: Diagnosis not present

## 2021-08-11 DIAGNOSIS — J90 Pleural effusion, not elsewhere classified: Secondary | ICD-10-CM | POA: Diagnosis not present

## 2021-08-11 DIAGNOSIS — I5033 Acute on chronic diastolic (congestive) heart failure: Secondary | ICD-10-CM | POA: Diagnosis not present

## 2021-08-11 DIAGNOSIS — I10 Essential (primary) hypertension: Secondary | ICD-10-CM | POA: Diagnosis not present

## 2021-08-11 DIAGNOSIS — I4891 Unspecified atrial fibrillation: Secondary | ICD-10-CM | POA: Diagnosis not present

## 2021-08-11 LAB — CYTOLOGY - NON PAP

## 2021-08-13 ENCOUNTER — Telehealth (INDEPENDENT_AMBULATORY_CARE_PROVIDER_SITE_OTHER): Payer: Self-pay | Admitting: *Deleted

## 2021-08-13 NOTE — Telephone Encounter (Signed)
Dr. Laural Golden reviewed labs faxed from Dr. Edythe Lynn office. He wants to change lasix dose to 59m once daily in the mornings. Check weight daily and call next Tuesday or Wednesday to get an update. Recheck labs in 2 weeks. I called and discussed with pt's wife Tyler Farmer and she verbalized understanding.

## 2021-08-15 DIAGNOSIS — I509 Heart failure, unspecified: Secondary | ICD-10-CM | POA: Diagnosis not present

## 2021-08-17 NOTE — Telephone Encounter (Signed)
Wife called back and states she is giving 55m of lactulose tid and gave this morning but states he is feeling confused and different "in his head" and having trouble with vision. I advised her to take him to ED.

## 2021-08-17 NOTE — Telephone Encounter (Signed)
Called to get update. Patient taking lasix 27m one qam. Weight in hospital was 197 lb. Weight had been coming down every day since coming home from hospital Weight 5/28 and 5/29 188 lbs. Today weight is 190 lbs. States he is doing ok. Has appt with pulmonologist on 7/7 with dr wert. States breathing is ok right now.

## 2021-08-18 NOTE — Telephone Encounter (Signed)
Per Dr. Laural Golden weight looks ok. I called pt's wife because I saw he did not go to ED. She states he started feeling better and declined to go. Not having any concerns today.

## 2021-08-23 ENCOUNTER — Other Ambulatory Visit (INDEPENDENT_AMBULATORY_CARE_PROVIDER_SITE_OTHER): Payer: Self-pay | Admitting: Internal Medicine

## 2021-08-23 ENCOUNTER — Telehealth (INDEPENDENT_AMBULATORY_CARE_PROVIDER_SITE_OTHER): Payer: Self-pay | Admitting: *Deleted

## 2021-08-23 NOTE — Telephone Encounter (Signed)
Patient's wife called to let Dr. Laural Golden know that she got his appt with pulmologist to this Friday with Dr. Melvyn Novas at 3:30. States he is doing pretty good.  Weight today 187 lb 5/20 197 lbs was the highest and has been coming down, 192, 190, 189, 187. Taking lasix 50m once daily. Would like to know when next labs are due.

## 2021-08-25 ENCOUNTER — Other Ambulatory Visit: Payer: Self-pay | Admitting: *Deleted

## 2021-08-25 DIAGNOSIS — D649 Anemia, unspecified: Secondary | ICD-10-CM

## 2021-08-25 DIAGNOSIS — K7460 Unspecified cirrhosis of liver: Secondary | ICD-10-CM

## 2021-08-25 NOTE — Telephone Encounter (Signed)
Dr. Laural Golden reviewed message and note from 5/26 said to repeat cbc and bmet in 2 weeks. Pt's wife Inez Catalina notified and lab order put in and mailed to her.

## 2021-08-27 ENCOUNTER — Encounter: Payer: Self-pay | Admitting: Internal Medicine

## 2021-08-27 ENCOUNTER — Ambulatory Visit (INDEPENDENT_AMBULATORY_CARE_PROVIDER_SITE_OTHER): Payer: Medicare Other

## 2021-08-27 ENCOUNTER — Ambulatory Visit: Payer: Medicare Other | Admitting: Internal Medicine

## 2021-08-27 DIAGNOSIS — R0609 Other forms of dyspnea: Secondary | ICD-10-CM

## 2021-08-27 DIAGNOSIS — J9611 Chronic respiratory failure with hypoxia: Secondary | ICD-10-CM | POA: Diagnosis not present

## 2021-08-27 DIAGNOSIS — J90 Pleural effusion, not elsewhere classified: Secondary | ICD-10-CM | POA: Diagnosis not present

## 2021-08-27 NOTE — Patient Instructions (Signed)
Change your next visit in [place] from a consult to a follow up office visit.

## 2021-08-27 NOTE — Progress Notes (Unsigned)
Tyler Farmer, male    DOB: 1940/07/23    MRN: [id number]   Brief patient profile:  56  yowm quit smoking 1991 s apparent sequelae  referred to pulmonary clinic in [place]  08/27/2021 by Dr Denton Brick for resp failure    Admission date:  08/02/2021   Discharge Date:  08/06/2021    Discharge Diagnosis  Pleural effusion [J90] Bilateral pleural effusion [J90] Community acquired pneumonia, unspecified laterality [J18.9]     Acute diastolic CHF (congestive heart failure) ([place])   Community Acquired Pneumonia   Acute respiratory failure with hypoxia (HCC)   HTN (hypertension)   DM (diabetes mellitus) ([place])   Liver cirrhosis secondary to NASH ([place])   Portal hypertension (HCC)   Atrial fibrillation, chronic ([place])   Large bilateral pleural effusions      Chief Complaint: Difficulty breathing.   HPI: Tyler Farmer is a 81 y.o. male with history significant for multiple medical problems including atrial fibrillation, hypertension, diabetes, [name] liver cirrhosis, COPD, GI bleed, portal vein thrombosis, hepatic encephalopathy TIPs, CHF. Patient presented to ED with complaints of difficulty breathing of 1 week duration, spouse reportrf they have been checking his weight, and its been increasing by 1 pound every day.  He called his outpatient provider, he was initially started on Lasix 40 mg daily and increased to twice daily without significant improvement in symptoms.   Patient also reports tightness in his thighs and some abdominal bloating.  He denies cough.  No chest pain.   ED Course: Temperature 97.8.  Heart rate 91-119.  Respiratory symptoms 17- 27.  O2 sats down to 88% placed on 3 L.  Blood pressure systolic ranging from 28-315.  O2 sats 91 to 98% on room air.  BNP 405.  Troponin 12 >> 11.  CTA chest negative for PE, shows large bilateral pleural effusion left greater than right, lower lobe consolidation left greater than right.  IV Lasix 40 mg x 1 given. IV Levaquin given.        Assessment and Plan:   *Acute on chronic diastolic CHF (congestive heart failure) (HCC) -Echo 03/2021, EF of 60 to 65% Respiratory status improving after thoracentesis with removal of 1.25 L of fluid from the left pleural space on 08/04/2021 -Did not attempt ReDS clip test on him due to large curvature of his thoracic spine.  -Patient required another left-sided thoracentesis with removal of 1.1 L of fluid on 08/06/2021 -Fluid protein is low however pleural fluid LDH is high--- there is concern for pneumonia which could explain this trend towards exudative fluid -Given recurrent nature of pleural fluid patient has been advised to follow-up with pulmonologist Dr. Melida Quitter further evaluation to exclude the possibility of underlying malignancy -Weight is down to 189 lb from 205 lb on admission -Treated with IV Lasix with discharge home on Lasix 60 mg qam and 20 mg daily   Acute on chronic respiratory failure with hypoxia ([place]) Likely secondary to large pleural effusions from decompensated CHF. Currently requiring 3 L of oxygen via nasal cannula Overall improving after serial thoracentesis on 08/04/2021  and again on 08/06/21 as outlined above   AKI----acute kidney injury on CKD stage -3A -Creatinine trending back down at 1.40 -Lasix adjusted as above renally adjust medications, avoid nephrotoxic agents / dehydration  / hypotension   Community Acquired Pneumonia -Clinical picture more consistent with CHF than pneumonia -Repeat chest x-ray on 08/06/2021 as patient already had thoracentesis -Continue Levaquin for now   Large bilateral pleural effusions -- Left-sided  thoracentesis as outlined above #1 -Repeat chest x-ray in 1 week and follow-up with pulmonologist advised   Atrial fibrillation, chronic ([place]) Currently RATED CONTROLLED atrial fibrillation.  -  Not on anticoagulation due to history of GI bleed.   Portal hypertension (HCC) -- this is chronic secondary to his liver disease     Liver cirrhosis secondary to NASH [facility]) Mental status intact.   reports compliance with lactulose 45 mils TID and Xifaxan Lasix and Aldactone as above #1  Continue lactulose and Xifaxan -Keep appointment with Dr. Melony Overly for 08/09/2021   DM (diabetes mellitus) ([place]) -Diet controlled    HTN (hypertension) -Continue Lasix and Aldactone and metoprolol  25-37.5 twice daily         History of Present Illness  08/27/2021  Pulmonary/ 1st office eval/ Tyler Farmer / [place] [facility]  Chief Complaint  Patient presents with   Follow-up    SOB   Dyspnea:  room to room  Cough: none / slt raspy throat Sleep: blocks under bed x 30 degrees  02:  3lpm hs/ 3lpm at rest/ 3lpm with activity  No obvious day to day or daytime pattern/variability or assoc excess/ purulent sputum or mucus plugs or hemoptysis or cp or chest tightness, subjective wheeze or overt sinus or hb symptoms.   Sleeping as above  without nocturnal  or early am exacerbation  of respiratory  c/o's or need for noct saba. Also denies any obvious fluctuation of symptoms with weather or environmental changes or other aggravating or alleviating factors except as outlined above   No unusual exposure hx or h/o childhood pna/ asthma or knowledge of premature birth.  Current Allergies, Complete Past Medical History, Past Surgical History, Family History, and Social History were reviewed in [employer] record.  ROS  The following are not active complaints unless bolded Hoarseness, sore throat, dysphagia, dental problems, itching, sneezing,  nasal congestion or discharge of excess mucus or purulent secretions, ear ache,   fever, chills, sweats, unintended wt loss or wt gain, classically pleuritic or exertional cp,  orthopnea pnd or arm/hand swelling  or leg swelling, presyncope, palpitations, abdominal pain, anorexia, nausea, vomiting, diarrhea  or change in bowel habits or change in bladder habits, change in stools or change in  urine, dysuria, hematuria,  rash, arthralgias, visual complaints, headache, numbness, weakness or ataxia or problems with walking or coordination,  change in mood or  memory.           Past Medical History:  Diagnosis Date   A-fib ([place])    AKI (acute kidney injury) ([place]) 01/27/2021   Arthritis    Hands/Fingers   Asthma    Back pain    Cirrhosis of liver (HCC)    Diabetes mellitus ([place])    Enlarged prostate    Gastric ulcer    GERD (gastroesophageal reflux disease)    H/O bladder problems    History of kidney stones    Hypercholesteremia    Hypertension    Prostate cancer ([place])    Thyroid disease    UGI bleed 10/26/2016   Vitamin B12 deficiency 10/26/2016    Outpatient Medications Prior to Visit  Medication Sig Dispense Refill   acetaminophen (TYLENOL) 500 MG tablet Take 500 mg by mouth every 6 (six) hours as needed for mild pain.     aspirin EC 81 MG tablet Take 1 tablet (81 mg total) by mouth daily with breakfast. Swallow whole. 30 tablet 11   Cranberry 500 MG TABS Take 500 mg by  mouth daily.     ferrous sulfate 325 (65 FE) MG tablet Take 325 mg by mouth daily with breakfast.     furosemide (LASIX) 20 MG tablet Take 2 tablets (40 mg total) by mouth 2 (two) times daily. 240 tablet 1   glipiZIDE (GLUCOTROL) 10 MG tablet Take 10 mg by mouth 2 (two) times daily before a meal.     guaiFENesin (MUCINEX) 600 MG 12 hr tablet Take 600 mg by mouth 2 (two) times daily as needed for cough or to loosen phlegm.     lactulose (CHRONULAC) 10 GM/15ML solution Take 45 mLs (30 g total) by mouth 3 (three) times daily. 946 mL 5   levothyroxine (SYNTHROID) 137 MCG tablet Take 137 mcg by mouth daily before breakfast.     magnesium oxide (MAG-OX) 400 MG tablet Take 400 mg by mouth daily.     metoCLOPramide (REGLAN) 5 MG tablet Take 1 tablet (5 mg total) by mouth 3 (three) times daily before meals. 90 tablet 1   metoprolol tartrate (LOPRESSOR) 25 MG tablet Take 1.5 tablets (37.5 mg total) by mouth  every morning. & 25 mg in the evening (Patient taking differently: Take 25-37.5 mg by mouth every morning. 37.53m in the morning & 25 mg in the evening) 135 tablet 2   ondansetron (ZOFRAN) 4 MG tablet Take 1 tablet (4 mg total) by mouth every 8 (eight) hours as needed for nausea or vomiting. 30 tablet 1   OXYGEN Inhale 3 L into the lungs continuous.     pantoprazole (PROTONIX) 40 MG tablet Take 1 tablet by mouth twice daily 60 tablet 5   potassium chloride (KLOR-CON) 10 MEQ tablet Take 10 mEq by mouth daily.     rifaximin (XIFAXAN) 550 MG TABS tablet Take 550 mg by mouth 2 (two) times daily.     simvastatin (ZOCOR) 40 MG tablet Take 40 mg by mouth at bedtime.      spironolactone (ALDACTONE) 25 MG tablet Take 0.5 tablets (12.5 mg total) by mouth 2 (two) times daily. 30 tablet 2   No facility-administered medications prior to visit.     Objective:     BP 138/68 (BP Location: Right Arm, Patient Position: Sitting, Cuff Size: Normal)   Pulse 82   Temp 98.4 F (36.9 C) (Oral)   Ht 5' 9"  (14.917m)   Wt 185 lb 3.2 oz (84 kg)   SpO2 91%   BMI 27.35 kg/m   SpO2: 91 % on RA  Elderly somewhat frail wm w/c bound/ slt hoarse    HEENT : Oropharynx clear/ edentulous      Nasal turbinates mild edema/no secretions   NECK :  without  apparent JVD/ palpable Nodes/TM    LUNGS: no acc muscle use,  Nl contour chest with decreased bs/ dullness L base without cough on insp or exp maneuvers   CV:  RRR  no s3 or murmur or increase in P2, and no edema   ABD:  Mod distended but soft and nontender with nl inspiratory excursion in the supine position. No bruits or organomegaly appreciated   MS:  Nl gait/ ext warm without deformities Or obvious joint restrictions  calf tenderness, cyanosis or clubbing    SKIN: warm and dry without lesions    NEURO:  alert, approp, nl sensorium with  no motor or cerebellar deficits apparent.     CXR PA and Lateral:   08/27/2021 :    I personally reviewed images and  impression is as follows:  Large L effusion      Assessment   Recurrent pleural effusion on left Thoracentesis 08/04/21  1250 cc transudative/  368 wbc with L > P  Atypical cells Thoracentesis 08/06/21  Repeated and discarded x 1.1 liters - recurrent  08/27/2021  >rec repeat US guided for repeat cytology   In setting of cirrhosis but no obvious ascites on most recent CT with atypical cells noted and rapid reaccumulation despite lasix/aldoctone.  The L sided location is again hepatic hydrothorax and chf despite reassuring "transudative" alb and protein levels  rec Repeat us/ guided T centesis next week for cyt/ flow cytometry.        Chronic respiratory failure with hypoxia ([place]) 02 rx as of 08/27/2021 = 3lpm 24/7   Advised: Make sure you check your oxygen saturation  AT  your highest level of activity (not after you stop)   to be sure it stays over 90% and adjust  02 flow upward to maintain this level if needed but remember to turn it back to previous settings when you stop (to conserve your supply).      Each maintenance medication was reviewed in detail including emphasizing most importantly the difference between maintenance and prns and under what circumstances the prns are to be triggered using an action plan format where appropriate.  Total time for H and P, chart review, counseling, reviewing 02  device(s) and generating customized AVS unique to this post hosp office visit / same day charting =  30 min for    refractory effusion  of uncertain etiology     Christinia Gully, MD 08/27/2021

## 2021-08-28 ENCOUNTER — Encounter: Payer: Self-pay | Admitting: Internal Medicine

## 2021-08-28 DIAGNOSIS — J90 Pleural effusion, not elsewhere classified: Secondary | ICD-10-CM | POA: Insufficient documentation

## 2021-08-28 DIAGNOSIS — J9611 Chronic respiratory failure with hypoxia: Secondary | ICD-10-CM | POA: Insufficient documentation

## 2021-08-28 NOTE — Assessment & Plan Note (Signed)
02 rx as of 08/27/2021 = 3lpm 24/7   Advised: Make sure you check your oxygen saturation  AT  your highest level of activity (not after you stop)   to be sure it stays over 90% and adjust  02 flow upward to maintain this level if needed but remember to turn it back to previous settings when you stop (to conserve your supply).

## 2021-08-28 NOTE — Assessment & Plan Note (Signed)
Thoracentesis 08/04/21  1250 cc transudative/  368 wbc with L > P  Atypical cells Thoracentesis 08/06/21  Repeated and discarded x 1.1 liters - recurrent  08/27/2021  >rec repeat US guided for repeat cytology   In setting of cirrhosis but no obvious ascites on most recent CT with atypical cells noted and rapid reaccumulation despite lasix/aldoctone.  The L sided location is again hepatic hydrothorax and chf despite reassuring "transudative" alb and protein levels  rec Repeat us/ guided T centesis next week for cyt/ flow cytometry.          Each maintenance medication was reviewed in detail including emphasizing most importantly the difference between maintenance and prns and under what circumstances the prns are to be triggered using an action plan format where appropriate.  Total time for H and P, chart review, counseling, reviewing 02  device(s) and generating customized AVS unique to this post hosp office visit / same day charting =  30 min for    refractory effusion  of uncertain etiology

## 2021-08-31 DIAGNOSIS — K7460 Unspecified cirrhosis of liver: Secondary | ICD-10-CM | POA: Diagnosis not present

## 2021-08-31 DIAGNOSIS — D649 Anemia, unspecified: Secondary | ICD-10-CM | POA: Diagnosis not present

## 2021-08-31 DIAGNOSIS — K7581 Nonalcoholic steatohepatitis (NASH): Secondary | ICD-10-CM | POA: Diagnosis not present

## 2021-09-01 LAB — CBC WITH DIFFERENTIAL/PLATELET
Absolute Monocytes: 712 cells/uL (ref 200–950)
Basophils Absolute: 82 cells/uL (ref 0–200)
Basophils Relative: 1.3 %
Eosinophils Absolute: 529 cells/uL — ABNORMAL HIGH (ref 15–500)
Eosinophils Relative: 8.4 %
HCT: 44.1 % (ref 38.5–50.0)
Hemoglobin: 14.7 g/dL (ref 13.2–17.1)
Lymphs Abs: 1147 cells/uL (ref 850–3900)
MCH: 31.5 pg (ref 27.0–33.0)
MCHC: 33.3 g/dL (ref 32.0–36.0)
MCV: 94.4 fL (ref 80.0–100.0)
MPV: 9.9 fL (ref 7.5–12.5)
Monocytes Relative: 11.3 %
Neutro Abs: 3830 cells/uL (ref 1500–7800)
Neutrophils Relative %: 60.8 %
Platelets: 171 10*3/uL (ref 140–400)
RBC: 4.67 10*6/uL (ref 4.20–5.80)
RDW: 14.4 % (ref 11.0–15.0)
Total Lymphocyte: 18.2 %
WBC: 6.3 10*3/uL (ref 3.8–10.8)

## 2021-09-01 LAB — BASIC METABOLIC PANEL
BUN/Creatinine Ratio: 11 (calc) (ref 6–22)
BUN: 22 mg/dL (ref 7–25)
CO2: 29 mmol/L (ref 20–32)
Calcium: 9.5 mg/dL (ref 8.6–10.3)
Chloride: 97 mmol/L — ABNORMAL LOW (ref 98–110)
Creat: 2.04 mg/dL — ABNORMAL HIGH (ref 0.70–1.22)
Glucose, Bld: 198 mg/dL — ABNORMAL HIGH (ref 65–139)
Potassium: 4.5 mmol/L (ref 3.5–5.3)
Sodium: 135 mmol/L (ref 135–146)

## 2021-09-02 ENCOUNTER — Telehealth (INDEPENDENT_AMBULATORY_CARE_PROVIDER_SITE_OTHER): Payer: Self-pay | Admitting: *Deleted

## 2021-09-02 NOTE — Telephone Encounter (Signed)
Pt's wife Inez Catalina called and asked if he was suppose to stop potassium also when stopping fluid pill. He is taking potassium 10 meq one daily. And also wants to know when does he repeat labs.

## 2021-09-03 ENCOUNTER — Other Ambulatory Visit (INDEPENDENT_AMBULATORY_CARE_PROVIDER_SITE_OTHER): Payer: Self-pay | Admitting: *Deleted

## 2021-09-03 DIAGNOSIS — K7460 Unspecified cirrhosis of liver: Secondary | ICD-10-CM

## 2021-09-03 DIAGNOSIS — K7682 Hepatic encephalopathy: Secondary | ICD-10-CM

## 2021-09-03 NOTE — Telephone Encounter (Signed)
Left message to return call  Per dr Laural Golden - stop potassium and have met 7 done on Monday to have results at [place] on Tuesday.

## 2021-09-03 NOTE — Telephone Encounter (Signed)
Discussed with patient's wife and order put in for labs to do on Monday and faxed to [facility]. She verbalized understanding of all.

## 2021-09-05 DIAGNOSIS — K7290 Hepatic failure, unspecified without coma: Secondary | ICD-10-CM | POA: Diagnosis not present

## 2021-09-05 DIAGNOSIS — I8500 Esophageal varices without bleeding: Secondary | ICD-10-CM | POA: Diagnosis not present

## 2021-09-05 DIAGNOSIS — J90 Pleural effusion, not elsewhere classified: Secondary | ICD-10-CM | POA: Diagnosis not present

## 2021-09-05 DIAGNOSIS — K7581 Nonalcoholic steatohepatitis (NASH): Secondary | ICD-10-CM | POA: Diagnosis not present

## 2021-09-05 DIAGNOSIS — I5033 Acute on chronic diastolic (congestive) heart failure: Secondary | ICD-10-CM | POA: Diagnosis not present

## 2021-09-05 DIAGNOSIS — I4891 Unspecified atrial fibrillation: Secondary | ICD-10-CM | POA: Diagnosis not present

## 2021-09-05 DIAGNOSIS — I10 Essential (primary) hypertension: Secondary | ICD-10-CM | POA: Diagnosis not present

## 2021-09-05 DIAGNOSIS — N179 Acute kidney failure, unspecified: Secondary | ICD-10-CM | POA: Diagnosis not present

## 2021-09-06 DIAGNOSIS — K7581 Nonalcoholic steatohepatitis (NASH): Secondary | ICD-10-CM | POA: Diagnosis not present

## 2021-09-06 DIAGNOSIS — K7682 Hepatic encephalopathy: Secondary | ICD-10-CM | POA: Diagnosis not present

## 2021-09-06 DIAGNOSIS — K7460 Unspecified cirrhosis of liver: Secondary | ICD-10-CM | POA: Diagnosis not present

## 2021-09-07 ENCOUNTER — Ambulatory Visit (INDEPENDENT_AMBULATORY_CARE_PROVIDER_SITE_OTHER): Payer: Medicare Other | Admitting: Gastroenterology

## 2021-09-07 ENCOUNTER — Encounter (INDEPENDENT_AMBULATORY_CARE_PROVIDER_SITE_OTHER): Payer: Self-pay | Admitting: Gastroenterology

## 2021-09-07 VITALS — BP 104/70 | HR 71 | Ht 69.0 in | Wt 180.8 lb

## 2021-09-07 DIAGNOSIS — R7989 Other specified abnormal findings of blood chemistry: Secondary | ICD-10-CM

## 2021-09-07 DIAGNOSIS — K7460 Unspecified cirrhosis of liver: Secondary | ICD-10-CM

## 2021-09-07 DIAGNOSIS — K7581 Nonalcoholic steatohepatitis (NASH): Secondary | ICD-10-CM | POA: Diagnosis not present

## 2021-09-07 LAB — BASIC METABOLIC PANEL
BUN/Creatinine Ratio: 10 (calc) (ref 6–22)
BUN: 19 mg/dL (ref 7–25)
CO2: 29 mmol/L (ref 20–32)
Calcium: 8.3 mg/dL — ABNORMAL LOW (ref 8.6–10.3)
Chloride: 101 mmol/L (ref 98–110)
Creat: 1.92 mg/dL — ABNORMAL HIGH (ref 0.70–1.22)
Glucose, Bld: 187 mg/dL — ABNORMAL HIGH (ref 65–99)
Potassium: 4.7 mmol/L (ref 3.5–5.3)
Sodium: 136 mmol/L (ref 135–146)

## 2021-09-07 NOTE — Progress Notes (Signed)
Referring Provider: [name], MD Primary Care Physician:  [name], MD Primary GI Physician:Castaneda   Chief Complaint  Patient presents with   Cirrhosis    1 month follow up on Cirrhosis.  Dr. Laural Golden told patient last week to hold lasix and potassium and repeat labs were done yesterday.    HPI:   Tyler Farmer is a 81 y.o. male with past medical history of NASH cirrhosis complicated by nonbleeding grade 2 esophageal varices s/p banding, ascites, hx of bleeding gastric AVM, PUD s/p Billroth II gastrectomy, atrial fib, DM, GERD, HLD, HTN, prostate cancer, and acute portal vein thrombosis s/p TIPS, partial thrombectomy and coil embolization of splenorenal shunt.   Patient presenting today for follow up of cirrhosis.   History: Liver disease has been complicated by HE, esophageal varices with spontaneous splenorenal shunt and he also developed portal vein thrombosis with worsening hepatic encephalopathy leading to placement of TIPS and portal thrombectomy along with splenorenal shunt embolization in December 2022. Around the same time he was also diagnosed with gastric ulcer.  He was confirmed to have gastroparesis and has been maintained on low-dose metoclopramide.  Patient was admitted to [facility] on 08/02/2021 for shortness of breath.  He was found to have left lower lobe pneumonia and bilateral effusions.  He underwent bilateral thoracenteses.  He had right thoracenteses on 08/04/2021 with removal of 1250 mL of fluid and left thoracenteses on 08/06/2021 with removal of 1100 mL of fluid. discharged on 08/06/2021 on 12.5 mg spironolactone BID and lasix 10m in the morning and 232min the evening.  initial weight on that admission was 205 lbs.   Last seen 08/10/21, at that time he was continued on lactulose and Xifaxan Continued low dose reglan 31m58mID, continued on PPI BID for hx of gastric ulcer  Labs reviewed after last OV and lasix changed to 58m52mily with weight  checks daily and advised to recheck labs in 2 week.   Present:  Patient is not currently on lasix, as advised to stop by Dr. RehmLaural Goldener Bmet on 6/13 with worsening Creatnine. Last dose of Lasix was last Wednesday. He is still on spironolactone 12.31mg 12m. Weight is 180lbs today, 195 lbs on 5/23, at that time he was maintained on 60mg 49mx in the morning and 20mg l36m in the evening. They are checking his weight daily. Labs done on 5/26 with PCP reviewed by Dr. Rehman,Laural Goldennt then advised to decrease lasix to just 58mg da77min the morning and continue to check weight daily with repeat labs in 2 weeks. Creatnine 2.04 on 6/13, therefore advised to stop lasix completely. BMet done yesterday with K+ 4.7, Creat 1.92 (2.04 on 6/13), BUN 22 (19 on 6/13)  He is taking 431ml TID51mlactulose currently and Xifaxin, he is having 2 BMs per day. Wife reports no episodes of confusion. He is still on 3L supplemental O2. Feels that his breathing is much better. Wife reports that his appetite is not great. He denies nausea or vomiting. He denies any abdominal pain. Supposed to have follow up with Dr. Wert on 7Melvyn Novashough I do not see follow up appt listed in his chart.   Last liver US with d[place] feb 2023 with stable changes of cirrhosis, no masses, somewhat limited exam INR March 2023 1.3 AFP Nov 2022 0.6 Last MELD: 17  Cirrhosis related questions: Hematemesis/coffee ground emesis: no History of variceal bleeding: no Abdominal pain: no Abdominal distention/worsening ascites: no Fever/chills: no  Episodes of confusion/disorientation: no confusion, somewhat forgetful but this is not new Taking diuretics?:yes spironolactone 87m daily Prior history of banding?: no Prior episodes of SBP: no  GES: 07/02/21 markedly delayed gastric emptying.  Last Colonoscopy:3/30/22congestion in the cecum, diverticulosis in the sigmoid colon, 3 mm polyp in the sigmoid colon, external hemorrhoids.  Polyp was resected but not  retrieved.  Last Endoscopy:06/16/21 - The procedure was aborted due to presence of food large amount of food in the stomach. - Normal hypopharynx. - Normal gastroesophageal junction. - A large amount of food (residue) in the stomach. - No specimens collected. 11/26/22Grade III varices were found in the lower third of the esophagus. Three bands were successfully placed with complete eradication, resulting in deflation of varices. One oozing superficial gastric ulcer with oozing hemorrhage (Forrest Class Ib) was found in the gastric body. The lesion was 4 mm in largest dimension. For hemostasis, two hemostatic clips were successfully placed.   Evidence of a patent Billroth II gastrojejunostomy was found. The gastrojejunal anastomosis was characterized by healthy appearing mucosa. This was traversed. The efferent limb was examined. The afferent limb was examined.   Past Medical History:  Diagnosis Date   A-fib ([facility]    AKI (acute kidney injury) ([place] 01/27/2021   Arthritis    Hands/Fingers   Asthma    Back pain    Cirrhosis of liver (HCC)    Diabetes mellitus ([place]    Enlarged prostate    Gastric ulcer    GERD (gastroesophageal reflux disease)    H/O bladder problems    History of kidney stones    Hypercholesteremia    Hypertension    Prostate cancer ([facility]    Thyroid disease    UGI bleed 10/26/2016   Vitamin B12 deficiency 10/26/2016    Past Surgical History:  Procedure Laterality Date   CIRCUMCISION     at age 81  COLONOSCOPY N/A 11/13/2015   Procedure: COLONOSCOPY;  Surgeon: NRogene Houston MD;  [facility];  Service: Endoscopy;  Laterality: N/A;  2:10   COLONOSCOPY WITH PROPOFOL N/A 06/17/2020   Procedure: COLONOSCOPY WITH PROPOFOL;  Surgeon: RRogene Houston MD;  [facility];  Service: Endoscopy;  Laterality: N/A;  AM / patient had positive covid 05/25/20   ESOPHAGEAL BANDING N/A 02/13/2021   Procedure: ESOPHAGEAL BANDING;  Surgeon: CHarvel Quale MD;  [facility];  Service: Gastroenterology;  Laterality: N/A;   ESOPHAGOGASTRODUODENOSCOPY N/A 06/03/2014   Procedure: ESOPHAGOGASTRODUODENOSCOPY (EGD);  Surgeon: NRogene Houston MD;  [facility];  Service: Endoscopy;  Laterality: N/A;  730   ESOPHAGOGASTRODUODENOSCOPY N/A 10/26/2016   Procedure: ESOPHAGOGASTRODUODENOSCOPY (EGD);  Surgeon: RRogene Houston MD;  [facility];  Service: Endoscopy;  Laterality: N/A;   ESOPHAGOGASTRODUODENOSCOPY (EGD) WITH PROPOFOL N/A 06/17/2020   Procedure: ESOPHAGOGASTRODUODENOSCOPY (EGD) WITH PROPOFOL;  Surgeon: RRogene Houston MD;  [facility];  Service: Endoscopy;  Laterality: N/A;   ESOPHAGOGASTRODUODENOSCOPY (EGD) WITH PROPOFOL N/A 02/13/2021   Procedure: ESOPHAGOGASTRODUODENOSCOPY (EGD) WITH PROPOFOL;  Surgeon: CHarvel Quale MD;  [facility];  Service: Gastroenterology;  Laterality: N/A;   ESOPHAGOGASTRODUODENOSCOPY (EGD) WITH PROPOFOL N/A 06/16/2021   Procedure: ESOPHAGOGASTRODUODENOSCOPY (EGD) WITH PROPOFOL;  Surgeon: RRogene Houston MD;  [facility];  Service: Endoscopy;  Laterality: N/A;  910 ASA 3   Gastric Ulcer  1993   GIVENS CAPSULE STUDY N/A 10/28/2016   Procedure: GIVENS CAPSULE STUDY;  Surgeon: RRogene Houston MD;  [facility];  Service: Endoscopy;  Laterality: N/A;   HERNIA REPAIR     IR EMBO VENOUS NOT HEMORR HEMANG  INC GUIDE ROADMAPPING  03/05/2021   IR INTRAVASCULAR ULTRASOUND NON CORONARY  03/05/2021   IR RADIOLOGIST EVAL & MGMT  02/26/2021   IR RADIOLOGIST EVAL & MGMT  05/04/2021   IR THROMBECT VENO MECH MOD SED  03/05/2021   IR TIPS  03/05/2021   IR US GUIDE VASC ACCESS RIGHT  03/05/2021   IR US GUIDE VASC ACCESS RIGHT  03/05/2021   POLYPECTOMY  11/13/2015   Procedure: POLYPECTOMY;  Surgeon: Rogene Houston, MD;  [facility];  Service: Endoscopy;;  colon   POLYPECTOMY  06/17/2020   Procedure: POLYPECTOMY;   Surgeon: Rogene Houston, MD;  [facility];  Service: Endoscopy;;  sigmoid   RADIOLOGY WITH ANESTHESIA N/A 03/05/2021   Procedure: RADIOLOGY WITH ANESTHESIA    I.R. Rise Paganini;  Surgeon: Suzette Battiest, MD;  Location: [place];  Service: Radiology;  Laterality: N/A;   Right Elbow Right    A pin was put in    Current Outpatient Medications  Medication Sig Dispense Refill   acetaminophen (TYLENOL) 500 MG tablet Take 500 mg by mouth every 6 (six) hours as needed for mild pain.     aspirin EC 81 MG tablet Take 1 tablet (81 mg total) by mouth daily with breakfast. Swallow whole. 30 tablet 11   Cranberry 500 MG TABS Take 500 mg by mouth daily.     ferrous sulfate 325 (65 FE) MG tablet Take 325 mg by mouth daily with breakfast.     glipiZIDE (GLUCOTROL) 10 MG tablet Take 10 mg by mouth 2 (two) times daily before a meal.     guaiFENesin (MUCINEX) 600 MG 12 hr tablet Take 600 mg by mouth 2 (two) times daily as needed for cough or to loosen phlegm.     lactulose (CHRONULAC) 10 GM/15ML solution Take 45 mLs (30 g total) by mouth 3 (three) times daily. 946 mL 5   levothyroxine (SYNTHROID) 137 MCG tablet Take 137 mcg by mouth daily before breakfast.     magnesium oxide (MAG-OX) 400 MG tablet Take 400 mg by mouth daily.     metoCLOPramide (REGLAN) 5 MG tablet Take 1 tablet (5 mg total) by mouth 3 (three) times daily before meals. 90 tablet 1   metoprolol tartrate (LOPRESSOR) 25 MG tablet Take 1.5 tablets (37.5 mg total) by mouth every morning. & 25 mg in the evening (Patient taking differently: Take 25-37.5 mg by mouth every morning. 37.23m in the morning & 25 mg in the evening) 135 tablet 2   ondansetron (ZOFRAN) 4 MG tablet Take 1 tablet (4 mg total) by mouth every 8 (eight) hours as needed for nausea or vomiting. 30 tablet 1   OXYGEN Inhale 3 L into the lungs continuous.     pantoprazole (PROTONIX) 40 MG tablet Take 1 tablet by mouth twice daily 60 tablet 5   rifaximin (XIFAXAN) 550 MG TABS tablet  Take 550 mg by mouth 2 (two) times daily.     simvastatin (ZOCOR) 40 MG tablet Take 40 mg by mouth at bedtime.      spironolactone (ALDACTONE) 25 MG tablet Take 0.5 tablets (12.5 mg total) by mouth 2 (two) times daily. 30 tablet 2   furosemide (LASIX) 20 MG tablet Take 2 tablets (40 mg total) by mouth 2 (two) times daily. (Patient not taking: Reported on 09/07/2021) 240 tablet 1  potassium chloride (KLOR-CON) 10 MEQ tablet Take 10 mEq by mouth daily. (Patient not taking: Reported on 09/07/2021)     No current facility-administered medications for this visit.    Allergies as of 09/07/2021 - Review Complete 09/07/2021  Allergen Reaction Noted   Penicillins Nausea And Vomiting 05/26/2014    Family History  Problem Relation Age of Onset   Emphysema Mother    Cerebrovascular Accident Father    Heart disease Father    Diabetes Sister    Heart disease Brother    Heart disease Sister    Heart disease Brother    Diabetes Brother     Social History   Socioeconomic History   Marital status: Married    Spouse name: Not on file   Number of children: Not on file   Years of education: Not on file   Highest education level: Not on file  Occupational History   Not on file  Tobacco Use   Smoking status: Former    Types: Cigarettes    Quit date: 05/25/1989    Years since quitting: 32.3    Passive exposure: Past   Smokeless tobacco: Former    Quit date: 01/16/1990  Vaping Use   Vaping Use: Never used  Substance and Sexual Activity   Alcohol use: No    Alcohol/week: 0.0 standard drinks of alcohol    Comment: Drank many years - 30 years ago   Drug use: No   Sexual activity: Not on file  Other Topics Concern   Not on file  Social History Narrative   Not on file   Social Determinants of Health   Financial Resource Strain: Not on file  Food Insecurity: Not on file  Transportation Needs: Not on file  Physical Activity: Not on file  Stress: Not on file  Social Connections: Not on  file   Review of systems General: negative for malaise, night sweats, fever, chills, weight loss Neck: Negative for lumps, goiter, pain and significant neck swelling Resp: Negative for cough, wheezing, dyspnea at rest CV: Negative for chest pain, leg swelling, palpitations, orthopnea GI: denies melena, hematochezia, nausea, vomiting, diarrhea, constipation, dysphagia, odyonophagia, early satiety or unintentional weight loss.  MSK: Negative for joint pain or swelling, back pain, and muscle pain. Derm: Negative for itching or rash Psych: Denies depression, anxiety, memory loss, confusion. No homicidal or suicidal ideation.  Heme: Negative for prolonged bleeding, bruising easily, and swollen nodes. Endocrine: Negative for cold or heat intolerance, polyuria, polydipsia and goiter. Neuro: negative for tremor, gait imbalance, syncope and seizures. The remainder of the review of systems is noncontributory.  Physical Exam: BP 104/70 (BP Location: Left Arm, Patient Position: Sitting, Cuff Size: Large)   Pulse 71   Ht 5' 9"  (1.753 m)   Wt 180 lb 12.8 oz (82 kg)   BMI 26.70 kg/m  General:   Alert and oriented. No distress noted. Pleasant and cooperative.  Head:  Normocephalic and atraumatic. Eyes:  Conjuctiva clear without scleral icterus. Mouth:  Oral mucosa pink and moist. Good dentition. No lesions. Heart: Normal rate and rhythm, s1 and s2 heart sounds present.  Lungs: Clear lung sounds in all lobes. Respirations equal and unlabored. Abdomen:  +BS, soft, non-tender and non-distended. No rebound or guarding. No HSM or masses noted. Derm: No palmar erythema or jaundice Msk:  Symmetrical without gross deformities. Normal posture. Extremities:  very mild, non pitting edema Neurologic:  Alert and  oriented x4 Psych:  Alert and cooperative. Normal mood and affect.  Invalid input(s): "6 MONTHS"   ASSESSMENT: Tyler Farmer is a 81 y.o. male presenting today for follow up of renal function  in regards to need for diuretics secondary to decompensated cirrhosis.   Last dose of Lasix was last Wednesday. He is still on spironolactone 12.65m BID. Weight is 180lbs today, 195 lbs on 5/23, at that time he was maintained on 647mlasix in the morning and 2030masix in the evening. checking his weight daily at home. Labs done on 5/26 with PCP reviewed by Dr. RehLaural Goldenatient then advised to decrease lasix to just 10m39mily in the morning and continue to check weight daily with repeat labs in 2 weeks. Repeat labs on 6/13 with creatnine 2.04, advised at that time by dr. RehmLaural Goldenstop lasix altogether.  BMet done yesterday with K+ 4.7, Creat 1.92, BUN 22. Will continue to hold lasix at this time as creatnine is still elevated. He should continue on spironolactone 12.5mg 7m for now and continue to monitor weight very closely. Will repeat BMP in 1 month to see how renal function looks with consideration to restart lasix at that time if he Is retaining fluid. feels that breathing is doing well at this time.   He is taking 45ml 2mof lactulose currently and Xifaxin, he is having 2 BMs per day. Wife reports no episodes of confusion. Will continue with current regimen for now.    PLAN:  Continue to hold lasix for now given renal function 2. Continue 12.5 mg spironolactone BID  3. Continue daily weights, need to call if 3lbs wt gain in one day or 5 lbs weight gain in one week  4. Repeat BMP in 4 weeks to evaluate renal function 5. Consider restarting low dose lasix if renal function improves, pt having fluid retention 6. Liver [place] in [place] 7. Will repeat AFP, INR in September  All questions were answered, patient verbalized understanding and is in agreement with plan as outlined above.   Follow Up: 1 month  Joeline Freer L. CarlanAlver Sorrow APRN, AGNP-C Adult-Gerontology Nurse Practitioner [facility]

## 2021-09-07 NOTE — Patient Instructions (Signed)
We will hold lasix for now, continue spironolactone twice a day Please continue to weigh daily, if weight is starting to trend back up or if swelling is becoming worse, please call use  We will plan to have labs drawn again in 1 month to check kidney function and potassium.

## 2021-09-14 ENCOUNTER — Telehealth: Payer: Self-pay | Admitting: Internal Medicine

## 2021-09-14 NOTE — Telephone Encounter (Signed)
My understanding was he already had appt for 7/7 but if this was canceled  then ok to to add to next available ov slot p 09/24/21

## 2021-09-15 ENCOUNTER — Telehealth: Payer: Self-pay | Admitting: *Deleted

## 2021-09-15 ENCOUNTER — Other Ambulatory Visit: Payer: Self-pay | Admitting: *Deleted

## 2021-09-15 DIAGNOSIS — I509 Heart failure, unspecified: Secondary | ICD-10-CM | POA: Diagnosis not present

## 2021-09-15 DIAGNOSIS — K7460 Unspecified cirrhosis of liver: Secondary | ICD-10-CM

## 2021-09-15 DIAGNOSIS — R7989 Other specified abnormal findings of blood chemistry: Secondary | ICD-10-CM

## 2021-09-15 NOTE — Telephone Encounter (Signed)
Wife Tyler Farmer states she has not noticed swelling just the weight gain. She cannot take him today for labs but can go tomorrow morning - ok per [place]. [facility].

## 2021-09-15 NOTE — Telephone Encounter (Signed)
Patient's wife Inez Catalina called and states his weight has been going up each day since seen and today is 188 lbs. At [place] on 09/07/21 it wa 180lbs. Lasix is being held and he is taking spironolactone 48m one half bid. She wants to know if she needs to add lasix back. Also states his hands are shaking that started about one week ago. States its like he is nervous. I told her to call pcp about his hands shaking and she states she will.

## 2021-09-16 DIAGNOSIS — K7581 Nonalcoholic steatohepatitis (NASH): Secondary | ICD-10-CM | POA: Diagnosis not present

## 2021-09-16 DIAGNOSIS — R7989 Other specified abnormal findings of blood chemistry: Secondary | ICD-10-CM | POA: Diagnosis not present

## 2021-09-16 DIAGNOSIS — K7460 Unspecified cirrhosis of liver: Secondary | ICD-10-CM | POA: Diagnosis not present

## 2021-09-17 ENCOUNTER — Other Ambulatory Visit (INDEPENDENT_AMBULATORY_CARE_PROVIDER_SITE_OTHER): Payer: Self-pay | Admitting: Gastroenterology

## 2021-09-17 LAB — BASIC METABOLIC PANEL
BUN/Creatinine Ratio: 9 (calc) (ref 6–22)
BUN: 16 mg/dL (ref 7–25)
CO2: 27 mmol/L (ref 20–32)
Calcium: 8.5 mg/dL — ABNORMAL LOW (ref 8.6–10.3)
Chloride: 104 mmol/L (ref 98–110)
Creat: 1.78 mg/dL — ABNORMAL HIGH (ref 0.70–1.22)
Glucose, Bld: 178 mg/dL — ABNORMAL HIGH (ref 65–99)
Potassium: 5.1 mmol/L (ref 3.5–5.3)
Sodium: 139 mmol/L (ref 135–146)

## 2021-09-17 NOTE — Telephone Encounter (Signed)
Yesterday weight was 187 lb. Has not noticed any swelling.

## 2021-09-18 DIAGNOSIS — R7989 Other specified abnormal findings of blood chemistry: Secondary | ICD-10-CM | POA: Insufficient documentation

## 2021-09-20 NOTE — Telephone Encounter (Signed)
See result note. I called and discussed with pt's wife.

## 2021-09-20 NOTE — Telephone Encounter (Signed)
Patient called to check on results

## 2021-09-20 NOTE — Telephone Encounter (Signed)
Patient left vm on Friday after office closed asking for bloodwork results. I Called to see how patient was doing  and wife states pretty good. Weight yesterday 185 lbs.

## 2021-09-24 ENCOUNTER — Inpatient Hospital Stay: Payer: Medicare Other | Admitting: Internal Medicine

## 2021-09-30 ENCOUNTER — Telehealth (INDEPENDENT_AMBULATORY_CARE_PROVIDER_SITE_OTHER): Payer: Self-pay | Admitting: *Deleted

## 2021-09-30 NOTE — Telephone Encounter (Signed)
Pt's wife left voicemail asking I call her back about his weight.

## 2021-09-30 NOTE — Telephone Encounter (Signed)
Pt's wife called and states patient pcp told him his hands shake due to liver problems. They had an appt with him next Tuesday and doing renal u/s on 7/27. She wanted to report his weight. Today 188 lbs. Last OV 09/07/21 weight was 180 lbs. She states weight is staying around 186 -188 and has not noticed more than 3 lbs difference in weight. Taking spironolatone 44m one half bid and still holding lasix. She states she has not noticed him having any swelling. States he has sob at times. He is on oxygen 24/7 at 3 liters and had it off today when he went out and just got him back on it and checked O2 and it was 89. Advised her if oxygen did not come up in the 90's and if he was having sob she should take him to ED and she verbalized understanding.

## 2021-09-30 NOTE — Telephone Encounter (Signed)
Left message to return call with pt's wife betty

## 2021-09-30 NOTE — Telephone Encounter (Signed)
Patient's wife called and left vm - Weight today is 188 lbs. Goes for u/s on July 27th for kidneys. Not taking lasix in about 2 weeks.

## 2021-10-01 ENCOUNTER — Other Ambulatory Visit (INDEPENDENT_AMBULATORY_CARE_PROVIDER_SITE_OTHER): Payer: Self-pay | Admitting: *Deleted

## 2021-10-01 DIAGNOSIS — K7460 Unspecified cirrhosis of liver: Secondary | ICD-10-CM

## 2021-10-05 DIAGNOSIS — I4891 Unspecified atrial fibrillation: Secondary | ICD-10-CM | POA: Diagnosis not present

## 2021-10-05 DIAGNOSIS — R0602 Shortness of breath: Secondary | ICD-10-CM | POA: Diagnosis not present

## 2021-10-05 DIAGNOSIS — I8500 Esophageal varices without bleeding: Secondary | ICD-10-CM | POA: Diagnosis not present

## 2021-10-05 DIAGNOSIS — K7290 Hepatic failure, unspecified without coma: Secondary | ICD-10-CM | POA: Diagnosis not present

## 2021-10-05 DIAGNOSIS — I10 Essential (primary) hypertension: Secondary | ICD-10-CM | POA: Diagnosis not present

## 2021-10-05 DIAGNOSIS — E1165 Type 2 diabetes mellitus with hyperglycemia: Secondary | ICD-10-CM | POA: Diagnosis not present

## 2021-10-05 DIAGNOSIS — E039 Hypothyroidism, unspecified: Secondary | ICD-10-CM | POA: Diagnosis not present

## 2021-10-06 NOTE — Telephone Encounter (Signed)
I called pt's wife Inez Catalina to get an update. Weight today 185 and states Dr. Quintin Alto told her yesterday he did not see any swelling. Bloodwork results from yesterday on your desk.

## 2021-10-08 ENCOUNTER — Telehealth (INDEPENDENT_AMBULATORY_CARE_PROVIDER_SITE_OTHER): Payer: Self-pay | Admitting: *Deleted

## 2021-10-08 NOTE — Telephone Encounter (Signed)
Pt's wife called back and asked how was his kidney function was on labs that was sent over. Weight today is 184. Concerned about his hands shaking. States Dr. Quintin Alto told him it was from his liver. Also dr Quintin Alto changed spironlactone 31m to one whole pill in the morning and one half int he evening.( He was on 267mone half bid)

## 2021-10-09 ENCOUNTER — Other Ambulatory Visit (INDEPENDENT_AMBULATORY_CARE_PROVIDER_SITE_OTHER): Payer: Self-pay | Admitting: Internal Medicine

## 2021-10-11 ENCOUNTER — Other Ambulatory Visit (INDEPENDENT_AMBULATORY_CARE_PROVIDER_SITE_OTHER): Payer: Self-pay

## 2021-10-11 MED ORDER — METOCLOPRAMIDE HCL 5 MG PO TABS
5.0000 mg | ORAL_TABLET | Freq: Three times a day (TID) | ORAL | 1 refills | Status: AC
Start: 2021-10-11 — End: ?

## 2021-10-11 NOTE — Telephone Encounter (Signed)
Last seen 09/07/21

## 2021-10-11 NOTE — Telephone Encounter (Signed)
Left message to return call 

## 2021-10-12 NOTE — Telephone Encounter (Signed)
Left message to return call 

## 2021-10-13 NOTE — Telephone Encounter (Signed)
Wife called back today. ( She was in [facility] where she fell and fractured her wrist. Got  home today)  she states she will let me know what pt's weight is tomorrow. Last weight on 7/23 was 184 lbs. States he is a little confused mostly in the mornings. Taking lactulose 45 mls tid and xifaxan 532m one bid. States she thinks his stools have decreased. He has had one today and just took another dose of lactulose so thinks he might have another one later today. States he usually has two stools per day. Not confused right now when I was talking to her. States it is mostly in the mornings.

## 2021-10-14 ENCOUNTER — Ambulatory Visit (HOSPITAL_COMMUNITY): Payer: Medicare Other

## 2021-10-15 DIAGNOSIS — I509 Heart failure, unspecified: Secondary | ICD-10-CM | POA: Diagnosis not present

## 2021-10-18 NOTE — Telephone Encounter (Signed)
Patient wife aware of all, but states as of now he is doing well on 45 ml TID. She is aware if he develops any issues to call the office.

## 2021-10-20 ENCOUNTER — Other Ambulatory Visit: Payer: Self-pay | Admitting: Physician Assistant

## 2021-10-21 ENCOUNTER — Ambulatory Visit (HOSPITAL_COMMUNITY)
Admission: RE | Admit: 2021-10-21 | Discharge: 2021-10-21 | Disposition: A | Discharge status: Home / Self Care | Payer: Medicare Other | Source: Ambulatory Visit | Attending: Interventional Radiology | Admitting: Interventional Radiology

## 2021-10-21 ENCOUNTER — Ambulatory Visit (HOSPITAL_COMMUNITY)
Admission: RE | Admit: 2021-10-21 | Discharge: 2021-10-21 | Disposition: A | Discharge status: Home / Self Care | Payer: Medicare Other | Source: Ambulatory Visit | Attending: Urology | Admitting: Urology

## 2021-10-21 DIAGNOSIS — N281 Cyst of kidney, acquired: Secondary | ICD-10-CM | POA: Diagnosis not present

## 2021-10-21 DIAGNOSIS — I8500 Esophageal varices without bleeding: Secondary | ICD-10-CM

## 2021-10-21 DIAGNOSIS — K766 Portal hypertension: Secondary | ICD-10-CM | POA: Diagnosis not present

## 2021-10-21 DIAGNOSIS — R3129 Other microscopic hematuria: Secondary | ICD-10-CM | POA: Insufficient documentation

## 2021-10-21 NOTE — Progress Notes (Signed)
Letter sent.

## 2021-10-22 ENCOUNTER — Telehealth: Payer: Self-pay

## 2021-10-22 ENCOUNTER — Ambulatory Visit: Payer: Medicare Other | Admitting: Internal Medicine

## 2021-10-22 ENCOUNTER — Other Ambulatory Visit: Payer: Self-pay

## 2021-10-22 ENCOUNTER — Encounter: Payer: Self-pay | Admitting: Internal Medicine

## 2021-10-22 DIAGNOSIS — J90 Pleural effusion, not elsewhere classified: Secondary | ICD-10-CM

## 2021-10-22 DIAGNOSIS — J9611 Chronic respiratory failure with hypoxia: Secondary | ICD-10-CM

## 2021-10-22 NOTE — Telephone Encounter (Signed)
Per Dr. Melvyn Novas- need to call [place] and request that a cytology be done on fluid removed during patients thoracentesis scheduled for 10/29/2021 ordered by Dr. Ruthann Cancer.  Called and spoke to [place] and she states we need to call jennifer with scheduling at [phone]. Anderson Malta states to call Olegario Shearer [phone]. Called and spoke to [place] again and she states that Olegario Shearer is the one who advised to call [place].   [facility] Jennifer and left her a vm asking if there was anyone else who I could contact.   Verbally notified Dr. Melvyn Novas and he states if we are unable to contact someone regarding situation that  we should reorder thoracentesis.

## 2021-10-22 NOTE — Progress Notes (Signed)
Per Dr. Melvyn Novas, thoracentesis to be reordered under his name so cytology can be done on fluid removed. ATC IR office [phone] and LVM asking for a call back.

## 2021-10-22 NOTE — Patient Instructions (Addendum)
Keep appt for your thoracentesis (removing fluid from your L chest)  - we need cytology done on the fluid   If getting worse in the meantime please go to [facility]   Please schedule a follow up visit in 3 months but call sooner if needed

## 2021-10-22 NOTE — Telephone Encounter (Signed)
New thoracentesis ordered. ATC [phone] and [phone] to reschedule. Will notify patient of change once a call back is received.

## 2021-10-22 NOTE — Progress Notes (Unsigned)
Tyler Farmer, male    DOB: 13-Sep-1940    MRN: [id number]   Brief patient profile:  77  yowm quit smoking 1991 s apparent sequelae  referred to pulmonary clinic in [place]  08/27/2021 by Dr Tyler Farmer for resp failure    Admission date:  08/02/2021   Discharge Date:  08/06/2021    Discharge Diagnosis  Pleural effusion [J90] Bilateral pleural effusion [J90] Community acquired pneumonia, unspecified laterality [J18.9]     Acute diastolic CHF (congestive heart failure) ([place])   Community Acquired Pneumonia   Acute respiratory failure with hypoxia (HCC)   HTN (hypertension)   DM (diabetes mellitus) ([place])   Liver cirrhosis secondary to NASH ([place])   Portal hypertension (HCC)   Atrial fibrillation, chronic ([place])   Large bilateral pleural effusions      Chief Complaint: Difficulty breathing.   HPI: Tyler Farmer is a 81 y.o. male with history significant for multiple medical problems including atrial fibrillation, hypertension, diabetes, Tyler Farmer liver cirrhosis, COPD, GI bleed, portal vein thrombosis, hepatic encephalopathy TIPs, CHF. Patient presented to ED with complaints of difficulty breathing of 1 week duration, spouse reportrf they have been checking his weight, and its been increasing by 1 pound every day.  He called his outpatient provider, he was initially started on Lasix 40 mg daily and increased to twice daily without significant improvement in symptoms.   Patient also reports tightness in his thighs and some abdominal bloating.  He denies cough.  No chest pain.   ED Course: Temperature 97.8.  Heart rate 91-119.  Respiratory symptoms 17- 27.  O2 sats down to 88% placed on 3 L.  Blood pressure systolic ranging from 24-825.  O2 sats 91 to 98% on room air.  BNP 405.  Troponin 12 >> 11.  CTA chest negative for PE, shows large bilateral pleural effusion left greater than right, lower lobe consolidation left greater than right.  IV Lasix 40 mg x 1 given. IV Levaquin given.        Assessment and Plan:   *Acute on chronic diastolic CHF (congestive heart failure) (HCC) -Echo 03/2021, EF of 60 to 65% Respiratory status improving after thoracentesis with removal of 1.25 L of fluid from the left pleural space on 08/04/2021 -Did not attempt ReDS clip test on him due to large curvature of his thoracic spine.  -Patient required another left-sided thoracentesis with removal of 1.1 L of fluid on 08/06/2021 -Fluid protein is low however pleural fluid LDH is high--- there is concern for pneumonia which could explain this trend towards exudative fluid -Given recurrent nature of pleural fluid patient has been advised to follow-up with pulmonologist Dr. Melida Farmer further evaluation to exclude the possibility of underlying malignancy -Weight is down to 189 lb from 205 lb on admission -Treated with IV Lasix with discharge home on Lasix 60 mg qam and 20 mg daily   Acute on chronic respiratory failure with hypoxia ([place]) Likely secondary to large pleural effusions from decompensated CHF. Currently requiring 3 L of oxygen via nasal cannula Overall improving after serial thoracentesis on 08/04/2021  and again on 08/06/21 as outlined above   AKI----acute kidney injury on CKD stage -3A -Creatinine trending back down at 1.40 -Lasix adjusted as above renally adjust medications, avoid nephrotoxic agents / dehydration  / hypotension   Community Acquired Pneumonia -Clinical picture more consistent with CHF than pneumonia -Repeat chest x-ray on 08/06/2021 as patient already had thoracentesis -Continue Levaquin for now   Large bilateral pleural effusions -- Left-sided  thoracentesis as outlined above #1 -Repeat chest x-ray in 1 week and follow-up with pulmonologist advised   Atrial fibrillation, chronic ([place]) Currently RATED CONTROLLED atrial fibrillation.  -  Not on anticoagulation due to history of GI bleed.   Portal hypertension (HCC) -- this is chronic secondary to his liver disease     Liver cirrhosis secondary to NASH [facility] Mental status intact.   reports compliance with lactulose 45 mils TID and Xifaxan Lasix and Aldactone as above #1  Continue lactulose and Xifaxan -Keep appointment with Dr. Melony Farmer for 08/09/2021   DM (diabetes mellitus) ([place]) -Diet controlled    HTN (hypertension) -Continue Lasix and Aldactone and metoprolol  25-37.5 twice daily         History of Present Illness  08/27/2021  Pulmonary/ 1st Farmer eval/ Tyler Farmer / Tyler Farmer  Chief Complaint  Patient presents with   Follow-up    SOB   Dyspnea:  room to room  Cough: none / slt raspy throat Sleep: blocks under bed x 30 degrees  02:  3lpm hs/ 3lpm at rest/ 3lpm with activity Rec Change your next visit in [place] from a consult to a follow up Farmer visit.   10/22/2021  f/u ov/[place] Farmer/Tyler Farmer re: L effusion  maint on 02   Chief Complaint  Patient presents with   Follow-up    Feels breathing has worsened since last ov.     Dyspnea:  50 ft / gets let out at door  Cough: none Sleeping: blocks under bed x 30 degrees x years  SABA use: none  02: 3lpm 24/7  Covid status: vax x 3     No obvious day to day or daytime variability or assoc excess/ purulent sputum or mucus plugs or hemoptysis or cp or chest tightness, subjective wheeze or overt sinus or hb symptoms.   Sleeping as above  without nocturnal  or early am exacerbation  of respiratory  c/o's or need for noct saba. Also denies any obvious fluctuation of symptoms with weather or environmental changes or other aggravating or alleviating factors except as outlined above   No unusual exposure hx or h/o childhood pna/ asthma or knowledge of premature birth.  Current Allergies, Complete Past Medical History, Past Surgical History, Family History, and Social History were reviewed in [employer] record.  ROS  The following are not active complaints unless bolded Hoarseness, sore throat, dysphagia, dental  problems, itching, sneezing,  nasal congestion or discharge of excess mucus or purulent secretions, ear ache,   fever, chills, sweats, unintended wt loss or wt gain, classically pleuritic or exertional cp,  orthopnea pnd or arm/hand swelling  or leg swelling, presyncope, palpitations, abdominal pain, anorexia, nausea, vomiting, diarrhea  or change in bowel habits or change in bladder habits, change in stools or change in urine, dysuria, hematuria,  rash, arthralgias, visual complaints, headache, numbness, weakness or ataxia or problems with walking or coordination,  change in mood or  memory.        Current Meds  Medication Sig   acetaminophen (TYLENOL) 500 MG tablet Take 500 mg by mouth every 6 (six) hours as needed for mild pain.   aspirin EC 81 MG tablet Take 1 tablet (81 mg total) by mouth daily with breakfast. Swallow whole.   Cranberry 500 MG TABS Take 500 mg by mouth daily.   ferrous sulfate 325 (65 FE) MG tablet Take 325 mg by mouth daily with breakfast.   glipiZIDE (GLUCOTROL) 10 MG tablet Take 10 mg by  mouth 2 (two) times daily before a meal.   guaiFENesin (MUCINEX) 600 MG 12 hr tablet Take 600 mg by mouth 2 (two) times daily as needed for cough or to loosen phlegm.   lactulose (CHRONULAC) 10 GM/15ML solution Take 45 mLs (30 g total) by mouth 3 (three) times daily.   levothyroxine (SYNTHROID) 137 MCG tablet Take 137 mcg by mouth daily before breakfast.   magnesium oxide (MAG-OX) 400 MG tablet Take 400 mg by mouth daily.   metoCLOPramide (REGLAN) 5 MG tablet Take 1 tablet (5 mg total) by mouth 3 (three) times daily before meals.   metoprolol tartrate (LOPRESSOR) 25 MG tablet TAKE 1 & 1/2 (ONE & ONE-HALF) TABLETS BY MOUTH IN THE MORNING AND 1 IN THE EVENING   ondansetron (ZOFRAN) 4 MG tablet Take 1 tablet (4 mg total) by mouth every 8 (eight) hours as needed for nausea or vomiting.   OXYGEN Inhale 3 L into the lungs continuous.   pantoprazole (PROTONIX) 40 MG tablet Take 1 tablet by mouth  twice daily   simvastatin (ZOCOR) 40 MG tablet Take 40 mg by mouth at bedtime.    spironolactone (ALDACTONE) 25 MG tablet Take 0.5 tablets (12.5 mg total) by mouth 2 (two) times daily.   XIFAXAN 550 MG TABS tablet TAKE ONE TABLET BY MOUTH TWICE A DAY             Past Medical History:  Diagnosis Date   A-fib ([place])    AKI (acute kidney injury) ([place]) 01/27/2021   Arthritis    Hands/Fingers   Asthma    Back pain    Cirrhosis of liver (HCC)    Diabetes mellitus ([place])    Enlarged prostate    Gastric ulcer    GERD (gastroesophageal reflux disease)    H/O bladder problems    History of kidney stones    Hypercholesteremia    Hypertension    Prostate cancer ([place])    Thyroid disease    UGI bleed 10/26/2016   Vitamin B12 deficiency 10/26/2016       Objective:     Wt Readings from Last 3 Encounters:  10/22/21 184 lb 12.8 oz (83.8 kg)  09/07/21 180 lb 12.8 oz (82 kg)  08/27/21 185 lb 3.2 oz (84 kg)     Vital signs reviewed  10/22/2021  - Note at rest 02 sats  95% on 3 lpm cont    General appearance:    frail elderly wm difficulty standing and could not get on table even with assistance      HEENT : Oropharynx  clear/ only has 2 lower teeth o/w edentulous      Nasal turbinates nl    NECK :  without  apparent JVD/ palpable Nodes/TM    LUNGS: no acc muscle use,  Nl contour chest with decreased bs/ dullness at L base   CV:  RRR  no s3 or murmur or increase in P2, and no edema   ABD:  soft and nontender with nl inspiratory excursion in the supine position. No bruits or organomegaly appreciated   MS:  Nl gait/ ext warm without deformities Or obvious joint restrictions  calf tenderness, cyanosis or clubbing    SKIN: warm and dry without lesions    NEURO:  alert, approp, nl sensorium / very unsteady on his feet.          Assessment

## 2021-10-23 ENCOUNTER — Encounter: Payer: Self-pay | Admitting: Internal Medicine

## 2021-10-23 NOTE — Assessment & Plan Note (Signed)
02 rx as of 08/27/2021 = 3lpm 24/7   Advise: Make sure you check your oxygen saturation  AT  your highest level of activity (not after you stop)   to be sure it stays over 90% and adjust  02 flow upward to maintain this level if needed but remember to turn it back to previous settings when you stop (to conserve your supply).           Each maintenance medication was reviewed in detail including emphasizing most importantly the difference between maintenance and prns and under what circumstances the prns are to be triggered using an action plan format where appropriate.  Total time for H and P, chart review, counseling, reviewing 02 device(s) and generating customized AVS unique to this office visit / same day charting = 25 min

## 2021-10-23 NOTE — Assessment & Plan Note (Signed)
Thoracentesis 08/04/21  1250 cc transudative/  368 wbc with L > P  Atypical cells Thoracentesis 08/06/21  Repeated and discarded x 1.1 liters - recurrent  08/27/2021  >rec repeat US guided for repeat cytology   Repeat t centesis planned already for 10/29/21 rc cytology be done on this specimen and f/u here in 3 m, sooner prn

## 2021-10-29 ENCOUNTER — Ambulatory Visit (HOSPITAL_COMMUNITY)
Admission: RE | Admit: 2021-10-29 | Discharge: 2021-10-29 | Disposition: A | Discharge status: Home / Self Care | Payer: Medicare Other | Source: Ambulatory Visit | Attending: Diagnostic Radiology | Admitting: Diagnostic Radiology

## 2021-10-29 ENCOUNTER — Ambulatory Visit
Admission: RE | Admit: 2021-10-29 | Discharge: 2021-10-29 | Disposition: A | Discharge status: Home / Self Care | Payer: Medicare Other | Source: Ambulatory Visit | Attending: Interventional Radiology | Admitting: Interventional Radiology

## 2021-10-29 ENCOUNTER — Encounter (HOSPITAL_COMMUNITY): Payer: Self-pay

## 2021-10-29 ENCOUNTER — Ambulatory Visit (HOSPITAL_COMMUNITY)
Admission: RE | Admit: 2021-10-29 | Discharge: 2021-10-29 | Disposition: A | Discharge status: Home / Self Care | Payer: Medicare Other | Source: Ambulatory Visit | Attending: Internal Medicine | Admitting: Internal Medicine

## 2021-10-29 VITALS — BP 108/68 | HR 82 | Temp 98.3°F | Resp 20

## 2021-10-29 DIAGNOSIS — R846 Abnormal cytological findings in specimens from respiratory organs and thorax: Secondary | ICD-10-CM | POA: Diagnosis not present

## 2021-10-29 DIAGNOSIS — Z9689 Presence of other specified functional implants: Secondary | ICD-10-CM | POA: Diagnosis not present

## 2021-10-29 DIAGNOSIS — K7581 Nonalcoholic steatohepatitis (NASH): Secondary | ICD-10-CM | POA: Insufficient documentation

## 2021-10-29 DIAGNOSIS — K766 Portal hypertension: Secondary | ICD-10-CM | POA: Diagnosis not present

## 2021-10-29 DIAGNOSIS — K7460 Unspecified cirrhosis of liver: Secondary | ICD-10-CM | POA: Diagnosis not present

## 2021-10-29 DIAGNOSIS — I8500 Esophageal varices without bleeding: Secondary | ICD-10-CM

## 2021-10-29 DIAGNOSIS — I81 Portal vein thrombosis: Secondary | ICD-10-CM | POA: Diagnosis not present

## 2021-10-29 DIAGNOSIS — R918 Other nonspecific abnormal finding of lung field: Secondary | ICD-10-CM | POA: Diagnosis not present

## 2021-10-29 DIAGNOSIS — J90 Pleural effusion, not elsewhere classified: Secondary | ICD-10-CM | POA: Insufficient documentation

## 2021-10-29 HISTORY — PX: IR RADIOLOGIST EVAL & MGMT: IMG5224

## 2021-10-29 MED ORDER — LIDOCAINE HCL (PF) 2 % IJ SOLN
INTRAMUSCULAR | Status: AC
  Filled 2021-10-29: qty 10

## 2021-10-29 NOTE — Progress Notes (Signed)
Referring Physician(s): Dr. [name]   Reason for follow up: Initial virtual telephone follow up after TIPS, portal thrombectomy, and splenorenal shunt embolization on 03/05/21   History of present illness: 81 year old male with history of Childs Pugh B, NASH cirrhosis and portal hypertension complicated by esophageal varices, splenorenal shunt, and acute, non-occlusive portal vein thrombus, and hepatic encephalopathy likely secondary to patent splenorenal shunt with increasing retrograde/stagnant portal venous flow due to enlarging acute thrombus.  On 03/05/21 he underwent TIPS creation with portal thrombectomy (aspiration, Penumbra 12L) and coil embolization of large splenorenal shunt from retrograde approach.  He was discharged home the next day after an uneventful overnight observation stay.   I am meeting with his wife via virtual telephone clinic visit today.  She states that his encephalopathy prior to the procedure has remained at [place].  He still has some confusion, but this is described as a chronic process with him.  He has remained compliant with lactulose + rifaximin.  He denies abdominal pain, nausea, vomiting, hematemesis, or blood in stool.     He has been recently battling with recurrent left pleural effusion, suspected to be cardiac in etiology in the setting of congestive heart failure.  He is now off lasix due to worsening renal failure.  Still taking spironolactone.  He has an upcoming appointment with Dr. Jeffie Pollock for recent microscopic hematuria.  His wife states that overall he has been very weak lately.  No hematemeis, melena, abdominal distension or ascites, jaundice.  He underwent left thoracentesis today at [facility] by Dr. Anselm Pancoast yielding 1.9 L.    Past Medical History:  Diagnosis Date   A-fib [employer] [facility])    AKI (acute kidney injury) ([place]) 01/27/2021   Arthritis    Hands/Fingers   Asthma    Back pain    Cirrhosis of liver (HCC)    Diabetes mellitus ([place])    Enlarged  prostate    Gastric ulcer    GERD (gastroesophageal reflux disease)    H/O bladder problems    History of kidney stones    Hypercholesteremia    Hypertension    Prostate cancer ([place])    Thyroid disease    UGI bleed 10/26/2016   Vitamin B12 deficiency 10/26/2016    Past Surgical History:  Procedure Laterality Date   CIRCUMCISION     at age 28   COLONOSCOPY N/A 11/13/2015   Procedure: COLONOSCOPY;  Surgeon: Rogene Houston, MD;  [facility];  Service: Endoscopy;  Laterality: N/A;  2:10   COLONOSCOPY WITH PROPOFOL N/A 06/17/2020   Procedure: COLONOSCOPY WITH PROPOFOL;  Surgeon: Rogene Houston, MD;  [facility];  Service: Endoscopy;  Laterality: N/A;  AM / patient had positive covid 05/25/20   ESOPHAGEAL BANDING N/A 02/13/2021   Procedure: ESOPHAGEAL BANDING;  Surgeon: Harvel Quale, MD;  [facility];  Service: Gastroenterology;  Laterality: N/A;   ESOPHAGOGASTRODUODENOSCOPY N/A 06/03/2014   Procedure: ESOPHAGOGASTRODUODENOSCOPY (EGD);  Surgeon: Rogene Houston, MD;  [facility];  Service: Endoscopy;  Laterality: N/A;  730   ESOPHAGOGASTRODUODENOSCOPY N/A 10/26/2016   Procedure: ESOPHAGOGASTRODUODENOSCOPY (EGD);  Surgeon: Rogene Houston, MD;  [facility];  Service: Endoscopy;  Laterality: N/A;   ESOPHAGOGASTRODUODENOSCOPY (EGD) WITH PROPOFOL N/A 06/17/2020   Procedure: ESOPHAGOGASTRODUODENOSCOPY (EGD) WITH PROPOFOL;  Surgeon: Rogene Houston, MD;  [facility];  Service: Endoscopy;  Laterality: N/A;   ESOPHAGOGASTRODUODENOSCOPY (EGD) WITH PROPOFOL N/A 02/13/2021   Procedure: ESOPHAGOGASTRODUODENOSCOPY (EGD) WITH  PROPOFOL;  Surgeon: Montez Morita, Quillian Quince, MD;  [facility];  Service: Gastroenterology;  Laterality: N/A;   ESOPHAGOGASTRODUODENOSCOPY (EGD) WITH PROPOFOL N/A 06/16/2021   Procedure: ESOPHAGOGASTRODUODENOSCOPY (EGD) WITH PROPOFOL;  Surgeon: Rogene Houston, MD;  [facility];  Service: Endoscopy;  Laterality: N/A;  910 ASA 3   Gastric Ulcer  1993   GIVENS CAPSULE STUDY N/A 10/28/2016   Procedure: GIVENS CAPSULE STUDY;  Surgeon: Rogene Houston, MD;  [facility];  Service: Endoscopy;  Laterality: N/A;   HERNIA REPAIR     IR EMBO VENOUS NOT HEMORR HEMANG  INC GUIDE ROADMAPPING  03/05/2021   IR INTRAVASCULAR ULTRASOUND NON CORONARY  03/05/2021   IR RADIOLOGIST EVAL & MGMT  02/26/2021   IR RADIOLOGIST EVAL & MGMT  05/04/2021   IR THROMBECT VENO MECH MOD SED  03/05/2021   IR TIPS  03/05/2021   IR US GUIDE VASC ACCESS RIGHT  03/05/2021   IR US GUIDE VASC ACCESS RIGHT  03/05/2021   POLYPECTOMY  11/13/2015   Procedure: POLYPECTOMY;  Surgeon: Rogene Houston, MD;  [facility];  Service: Endoscopy;;  colon   POLYPECTOMY  06/17/2020   Procedure: POLYPECTOMY;  Surgeon: Rogene Houston, MD;  [facility];  Service: Endoscopy;;  sigmoid   RADIOLOGY WITH ANESTHESIA N/A 03/05/2021   Procedure: RADIOLOGY WITH ANESTHESIA    I.R. Rise Paganini;  Surgeon: Suzette Battiest, MD;  Location: [place];  Service: Radiology;  Laterality: N/A;   Right Elbow Right    A pin was put in    Allergies: Penicillins  Medications: Prior to Admission medications   Medication Sig Start Date End Date Taking? Authorizing Provider  acetaminophen (TYLENOL) 500 MG tablet Take 500 mg by mouth every 6 (six) hours as needed for mild pain.    [provider]  aspirin EC 81 MG tablet Take 1 tablet (81 mg total) by mouth daily with breakfast. Swallow whole. 02/21/21   Roxan Hockey, MD  Cranberry 500 MG TABS Take 500 mg by mouth daily.    [provider]  ferrous sulfate 325 (65 FE) MG tablet Take 325 mg by mouth daily with breakfast.    [provider]  furosemide (LASIX) 20 MG tablet Take 2 tablets (40 mg total) by mouth 2 (two) times daily. Patient not taking: Reported on 10/22/2021 08/10/21   Rogene Houston, MD  glipiZIDE (GLUCOTROL) 10 MG  tablet Take 10 mg by mouth 2 (two) times daily before a meal.    [provider]  guaiFENesin (MUCINEX) 600 MG 12 hr tablet Take 600 mg by mouth 2 (two) times daily as needed for cough or to loosen phlegm.    [provider]  lactulose (CHRONULAC) 10 GM/15ML solution Take 45 mLs (30 g total) by mouth 3 (three) times daily. 06/28/21   Rogene Houston, MD  levothyroxine (SYNTHROID) 137 MCG tablet Take 137 mcg by mouth daily before breakfast.    [provider]  magnesium oxide (MAG-OX) 400 MG tablet Take 400 mg by mouth daily.    [provider]  metoCLOPramide (REGLAN) 5 MG tablet Take 1 tablet (5 mg total) by mouth 3 (three) times daily before meals. 10/11/21   Carlan, Chelsea L, NP  metoprolol tartrate (LOPRESSOR) 25 MG tablet TAKE 1 & 1/2 (ONE & ONE-HALF) TABLETS BY MOUTH IN THE MORNING AND 1 IN THE EVENING 10/21/21   Imogene Burn, PA-C  ondansetron (ZOFRAN) 4 MG tablet Take  1 tablet (4 mg total) by mouth every 8 (eight) hours as needed for nausea or vomiting. 03/10/21   Montez Morita, Quillian Quince, MD  OXYGEN Inhale 3 L into the lungs continuous.    [provider]  pantoprazole (PROTONIX) 40 MG tablet Take 1 tablet by mouth twice daily 08/23/21   Rehman, Mechele Dawley, MD  potassium chloride (KLOR-CON) 10 MEQ tablet Take 10 mEq by mouth daily. Patient not taking: Reported on 10/22/2021 07/16/21   [provider]  simvastatin (ZOCOR) 40 MG tablet Take 40 mg by mouth at bedtime.     [provider]  spironolactone (ALDACTONE) 25 MG tablet Take 0.5 tablets (12.5 mg total) by mouth 2 (two) times daily. 08/06/21   Roxan Hockey, MD  XIFAXAN 550 MG TABS tablet TAKE ONE TABLET BY MOUTH TWICE A DAY 09/20/21   Carlan, Deatra Robinson, NP     Family History  Problem Relation Age of Onset   Emphysema Mother    Cerebrovascular Accident Father    Heart disease Father    Diabetes Sister    Heart disease Brother    Heart disease Sister    Heart disease  Brother    Diabetes Brother     Social History   Socioeconomic History   Marital status: Married    Spouse name: Not on file   Number of children: Not on file   Years of education: Not on file   Highest education level: Not on file  Occupational History   Not on file  Tobacco Use   Smoking status: Former    Types: Cigarettes    Quit date: 05/25/1989    Years since quitting: 32.4    Passive exposure: Past   Smokeless tobacco: Former    Quit date: 01/16/1990  Vaping Use   Vaping Use: Never used  Substance and Sexual Activity   Alcohol use: No    Alcohol/week: 0.0 standard drinks of alcohol    Comment: Drank many years - 30 years ago   Drug use: No   Sexual activity: Not on file  Other Topics Concern   Not on file  Social History Narrative   Not on file   Social Determinants of Health   Financial Resource Strain: Not on file  Food Insecurity: Not on file  Transportation Needs: Not on file  Physical Activity: Not on file  Stress: Not on file  Social Connections: Not on file     Vital Signs: There were no vitals taken for this visit.  No physical examination was performed in lieu of virtual telephone clinic visit.   Imaging: TIPS (03/05/21)  Extensive portal thrombus.    Persistent main portal thrombus after TIPS creation and large splenorenal shunt with retrograde portal flow.    After portal thrombectomy, decreased flow via shunt.    Completion portal venogram after splenorenal shunt coil embolization.     TIPS Duplex 04/28/21 Patent TIPS with mildly increased velocity (124 cm/sec) at hepatic vein aspect compared to mid TIPS (92 cm/sec).  [place] TIPS 10/21/21 IMPRESSION: 1. Patent portal vein and TIPS endograft. 2. Incidentally noted left greater than right bilateral effusions.    Labs:  CBC: Recent Labs    08/02/21 1526 08/03/21 0517 08/06/21 0452 08/31/21 1106  WBC 6.2 6.8 5.5 6.3  HGB 12.7* 12.2* 12.0* 14.7  HCT 38.5* 37.1* 36.4* 44.1  PLT  161 176 122* 171    COAGS: Recent Labs    04/30/21 0422 05/01/21 0156 05/14/21 0805 06/08/21 0853  INR  1.8* 1.8* 1.2* 1.3*    BMP: Recent Labs    08/02/21 1526 08/03/21 0517 08/05/21 0509 08/06/21 0452 08/31/21 1106 09/06/21 1332 09/16/21 1418  NA 139 139 138 137 135 136 139  K 3.6 3.8 4.0 3.8 4.5 4.7 5.1  CL 105 105 103 101 97* 101 104  CO2 29 26 25 29 29 29 27   GLUCOSE 159* 134* 129* 170* 198* 187* 178*  BUN <5* 16 18 17 22 19 16   CALCIUM 8.5* 8.6* 8.7* 9.0 9.5 8.3* 8.5*  CREATININE 1.29* 1.19 1.54* 1.40* 2.04* 1.92* 1.78*  GFRNONAA 56* >60 45* 51*  --   --   --     LIVER FUNCTION TESTS: Recent Labs    04/29/21 0440 04/30/21 0422 05/01/21 0156 05/14/21 0805 06/08/21 0853 08/02/21 1526 08/05/21 0509 08/06/21 0452  BILITOT 2.7* 2.4* 1.9* 1.6* 2.1* 2.1*  --   --   AST 29 37 54*  --  60* 33  --   --   ALT 21 22 28   --  38 22  --   --   ALKPHOS 108 106 104  --   --  190*  --   --   PROT 5.9* 5.6* 5.2*  --  6.7 6.6  --   --   ALBUMIN 2.6* 2.5* 2.3*  --   --  2.6* 2.2* 2.1*    Assessment and Plan: 81 year old male with history of Childs Pugh B, NASH cirrhosis and portal hypertension complicated by esophageal varices, splenorenal shunt, and acute, non-occlusive portal vein thrombus, and hepatic encephalopathy likely secondary to patent splenorenal shunt with increasing retrograde/stagnant portal venous flow due to enlarging acute thrombus.  He underwent TIPS creation on 03/05/21 with portal thrombectomy and coil embolization of large splenorenal shunt from retrograde approach.    Continues good clinical course after TIPS, patent on recent ultrasound, compliant with lactulose and rifaximin, remains on single diuretic spironolactone without evidence of gastrointestinal hemorrhage or ascites.  Recent recurrent left pleural effusion and weakness attributed to CHF.   -Plan to follow up in 6 months with repeat TIPS duplex. -Updated MELD labs as per GI - follows up with  Scherrie Gerlach, NP next week   Ruthann Cancer, MD Pager: [phone] Clinic: [phone]    I spent a total of 40 Minutes in virtual telephone clinical consultation, greater than 50% of which was counseling/coordinating care for portal hypertension.

## 2021-10-29 NOTE — Progress Notes (Signed)
Thoracentesis complete no signs of distress. Patient taken to xray for post thoracentesis chest xray.

## 2021-10-29 NOTE — Procedures (Signed)
Interventional Radiology Procedure:   Indications: Recurrent left pleural effusion  Procedure: US guided thoracentesis  Findings: Removed 1900 ml from left chest.  Complications: None     EBL: Less than 10 ml  Plan: Follow up CXR   Jericho Cieslik R. Anselm Pancoast, MD  Pager: [phone]

## 2021-11-01 LAB — CYTOLOGY - NON PAP

## 2021-11-02 ENCOUNTER — Ambulatory Visit (INDEPENDENT_AMBULATORY_CARE_PROVIDER_SITE_OTHER): Payer: Medicare Other | Admitting: Gastroenterology

## 2021-11-02 ENCOUNTER — Encounter (INDEPENDENT_AMBULATORY_CARE_PROVIDER_SITE_OTHER): Payer: Self-pay | Admitting: Gastroenterology

## 2021-11-02 VITALS — BP 100/68 | HR 101 | Ht 69.0 in | Wt 180.0 lb

## 2021-11-02 DIAGNOSIS — K7581 Nonalcoholic steatohepatitis (NASH): Secondary | ICD-10-CM

## 2021-11-02 DIAGNOSIS — K7460 Unspecified cirrhosis of liver: Secondary | ICD-10-CM

## 2021-11-02 NOTE — Patient Instructions (Signed)
We will update liver labs and liver Ultrasound Please continue to take spironolactone 41m in the morning and 12.58min the evening as you are doing Continue xifaxan twice a day Continue to weigh daily, please let usKoreanow about 3 pounds weight gain in 24 hours period or 5 pound weight gain in 1 week Goal is 2-3 soft bowel movements per day, I would prefer we stay with 4574mactulose three times per day as he is doing well with this, you can try cutting out the third dose if you feel he is having more than 2-3 BMs per day, however, if he has any confusion or flapping of his hands, he will need to continue with three times daily of the lactulose Please discuss anxiety/trouble sleeping with PCP  Follow up 3 months

## 2021-11-02 NOTE — Progress Notes (Unsigned)
Referring Provider: [name], MD Primary Care Physician:  [name], MD Primary GI Physician: Jenetta Downer  Chief Complaint  Patient presents with   Cirrhosis    Follow up on cirrhosis. Would like to discuss if he can take lactulose bid instead of tid and also if he needs to take reglan bid.    HPI:   Tyler Farmer is a 81 y.o. male with past medical history of NASH cirrhosis complicated by nonbleeding grade 2 esophageal varices s/p banding, ascites, hx of bleeding gastric AVM, PUD s/p Billroth II gastrectomy, atrial fib, DM, GERD, HLD, HTN, prostate cancer, and acute portal vein thrombosis s/p TIPS, partial thrombectomy and coil embolization of splenorenal shunt.   Patient presenting today for follow up of cirrhosis  History:  Liver disease has been complicated by HE, esophageal varices with spontaneous splenorenal shunt and he also developed portal vein thrombosis with worsening hepatic encephalopathy leading to placement of TIPS and portal thrombectomy along with splenorenal shunt embolization in December 2022. Around the same time he was also diagnosed with gastric ulcer.  He was confirmed to have gastroparesis and has been maintained on low-dose metoclopramide.   admitted to [facility] on 08/02/2021 for shortness of breath.  He was found to have left lower lobe pneumonia and bilateral effusions.  He underwent bilateral thoracenteses.  He had right thoracenteses on 08/04/2021 with removal of 1250 mL of fluid and left thoracenteses on 08/06/2021 with removal of 1100 mL of fluid. discharged on 08/06/2021 on 12.5 mg spironolactone BID and lasix 60m in the morning and 257min the evening.  initial weight on that admission was 205 lbs.    Labs reviewed after OV in may and and lasix changed to 4053maily with weight checks daily and advised to recheck labs in 2 week.   Patient last seen 09/07/21, at that time, not taking lasix, on spironolactone 12.5mg62mD, weight 180 lbs,  checking weights daily, creatnine 2.04 on 6/12. On 45ml30mtulose TID and xifaxan 550mg 52m having 2 BMs per day without episodes of confusion. On 3L o2.   Last liver US wit[place]ppler feb 2023 with stable changes of cirrhosis, no masses, somewhat limited exam INR March 2023 1.3 AFP Nov 2022 0.6  Advised to continue spironolactone 12.5mg BI49mdaily weights, repeat BMP in 4 weeks, Liver [place], repeat AFP and INR in September, lactulose and xifaxan continued.   Patient seen by Dr. Suttle Serafina RoyalsR on 8/11 with plan to repeat TIPS duplex in 6 months.   Last renal fucntion on 09/16/21 with creatnine 1.78  Present: Patient is continued on 3L O2, had thoracentesis with yield of 1.9L on Friday. States swelling to abdomen has been minimal. Weight is 180 today, consistent with last OV. Denies episodes of confusion. Having 2 BMs per day, stools are looser. No rectal bleeding or melena. Appetite is still not good. No GERD symptoms, no nausea or vomiting. He is taking spironolactone 25mg in78m morning and 12.5mg in t98mafternoon. Reports some issues with sleeping and feeling more anxious. Patient's wife Betty endInez Catalina some "tremors" in patients hands at times, she feels that he is more anxious/nervous recently.   Previous MELD 17 Previous Child Pugh: B  Cirrhosis related questions: Episodes of confusion/disorientation: no Taking diuretics? yes Beta blockers?  no Prior history of variceal banding? no Prior episodes of SBP? no Last liver imaging:Feb 2022  Alcohol use:no     GES: 07/02/21 markedly delayed gastric emptying.  Last  Colonoscopy:3/30/22congestion in the cecum, diverticulosis in the sigmoid colon, 3 mm polyp in the sigmoid colon, external hemorrhoids.  Polyp was resected but not retrieved.  Last Endoscopy:06/16/21 - The procedure was aborted due to presence of food large amount of food in the stomach. - Normal hypopharynx. - Normal gastroesophageal junction. - A large amount of food  (residue) in the stomach. - No specimens collected. 11/26/22Grade III varices were found in the lower third of the esophagus. Three bands were successfully placed with complete eradication, resulting in deflation of varices. One oozing superficial gastric ulcer with oozing hemorrhage (Forrest Class Ib) was found in the gastric body. The lesion was 4 mm in largest dimension. For hemostasis, two hemostatic clips were successfully placed.   Evidence of a patent Billroth II gastrojejunostomy was found. The gastrojejunal anastomosis was characterized by healthy appearing mucosa. This was traversed. The efferent limb was examined. The afferent limb was examined.  Past Medical History:  Diagnosis Date   A-fib [facility])    AKI (acute kidney injury) ([place]) 01/27/2021   Arthritis    Hands/Fingers   Asthma    Back pain    Cirrhosis of liver (HCC)    Diabetes mellitus ([place])    Enlarged prostate    Gastric ulcer    GERD (gastroesophageal reflux disease)    H/O bladder problems    History of kidney stones    Hypercholesteremia    Hypertension    Prostate cancer ([place])    Thyroid disease    UGI bleed 10/26/2016   Vitamin B12 deficiency 10/26/2016    Past Surgical History:  Procedure Laterality Date   CIRCUMCISION     at age 36   COLONOSCOPY N/A 11/13/2015   Procedure: COLONOSCOPY;  Surgeon: Rogene Houston, MD;  [facility];  Service: Endoscopy;  Laterality: N/A;  2:10   COLONOSCOPY WITH PROPOFOL N/A 06/17/2020   Procedure: COLONOSCOPY WITH PROPOFOL;  Surgeon: Rogene Houston, MD;  [facility];  Service: Endoscopy;  Laterality: N/A;  AM / patient had positive covid 05/25/20   ESOPHAGEAL BANDING N/A 02/13/2021   Procedure: ESOPHAGEAL BANDING;  Surgeon: Harvel Quale, MD;  [facility];  Service: Gastroenterology;  Laterality: N/A;   ESOPHAGOGASTRODUODENOSCOPY N/A 06/03/2014   Procedure: ESOPHAGOGASTRODUODENOSCOPY (EGD);  Surgeon: Rogene Houston, MD;   [facility];  Service: Endoscopy;  Laterality: N/A;  730   ESOPHAGOGASTRODUODENOSCOPY N/A 10/26/2016   Procedure: ESOPHAGOGASTRODUODENOSCOPY (EGD);  Surgeon: Rogene Houston, MD;  [facility];  Service: Endoscopy;  Laterality: N/A;   ESOPHAGOGASTRODUODENOSCOPY (EGD) WITH PROPOFOL N/A 06/17/2020   Procedure: ESOPHAGOGASTRODUODENOSCOPY (EGD) WITH PROPOFOL;  Surgeon: Rogene Houston, MD;  [facility];  Service: Endoscopy;  Laterality: N/A;   ESOPHAGOGASTRODUODENOSCOPY (EGD) WITH PROPOFOL N/A 02/13/2021   Procedure: ESOPHAGOGASTRODUODENOSCOPY (EGD) WITH PROPOFOL;  Surgeon: Harvel Quale, MD;  [facility];  Service: Gastroenterology;  Laterality: N/A;   ESOPHAGOGASTRODUODENOSCOPY (EGD) WITH PROPOFOL N/A 06/16/2021   Procedure: ESOPHAGOGASTRODUODENOSCOPY (EGD) WITH PROPOFOL;  Surgeon: Rogene Houston, MD;  [facility];  Service: Endoscopy;  Laterality: N/A;  910 ASA 3   Gastric Ulcer  1993   GIVENS CAPSULE STUDY N/A 10/28/2016   Procedure: GIVENS CAPSULE STUDY;  Surgeon: Rogene Houston, MD;  [facility];  Service: Endoscopy;  Laterality: N/A;   HERNIA REPAIR     IR EMBO VENOUS NOT HEMORR HEMANG  INC GUIDE ROADMAPPING  03/05/2021   IR INTRAVASCULAR ULTRASOUND NON  CORONARY  03/05/2021   IR RADIOLOGIST EVAL & MGMT  02/26/2021   IR RADIOLOGIST EVAL & MGMT  05/04/2021   IR RADIOLOGIST EVAL & MGMT  10/29/2021   IR THROMBECT VENO MECH MOD SED  03/05/2021   IR TIPS  03/05/2021   IR US GUIDE VASC ACCESS RIGHT  03/05/2021   IR US GUIDE VASC ACCESS RIGHT  03/05/2021   POLYPECTOMY  11/13/2015   Procedure: POLYPECTOMY;  Surgeon: Rogene Houston, MD;  [facility];  Service: Endoscopy;;  colon   POLYPECTOMY  06/17/2020   Procedure: POLYPECTOMY;  Surgeon: Rogene Houston, MD;  [facility];  Service: Endoscopy;;  sigmoid   RADIOLOGY WITH ANESTHESIA N/A 03/05/2021   Procedure: RADIOLOGY WITH ANESTHESIA    I.R.  Rise Paganini;  Surgeon: Suzette Battiest, MD;  Location: [facility];  Service: Radiology;  Laterality: N/A;   Right Elbow Right    A pin was put in    Current Outpatient Medications  Medication Sig Dispense Refill   acetaminophen (TYLENOL) 500 MG tablet Take 500 mg by mouth every 6 (six) hours as needed for mild pain.     aspirin EC 81 MG tablet Take 1 tablet (81 mg total) by mouth daily with breakfast. Swallow whole. 30 tablet 11   Cranberry 500 MG TABS Take 500 mg by mouth daily.     ferrous sulfate 325 (65 FE) MG tablet Take 325 mg by mouth daily with breakfast.     glipiZIDE (GLUCOTROL) 10 MG tablet Take 10 mg by mouth 2 (two) times daily before a meal.     guaiFENesin (MUCINEX) 600 MG 12 hr tablet Take 600 mg by mouth 2 (two) times daily as needed for cough or to loosen phlegm.     lactulose (CHRONULAC) 10 GM/15ML solution Take 45 mLs (30 g total) by mouth 3 (three) times daily. 946 mL 5   levothyroxine (SYNTHROID) 137 MCG tablet Take 137 mcg by mouth daily before breakfast.     magnesium oxide (MAG-OX) 400 MG tablet Take 400 mg by mouth daily.     MELATONIN PO Take by mouth. 69m takes 2 at night     metoCLOPramide (REGLAN) 5 MG tablet Take 1 tablet (5 mg total) by mouth 3 (three) times daily before meals. 90 tablet 1   metoprolol tartrate (LOPRESSOR) 25 MG tablet TAKE 1 & 1/2 (ONE & ONE-HALF) TABLETS BY MOUTH IN THE MORNING AND 1 IN THE EVENING 135 tablet 2   ondansetron (ZOFRAN) 4 MG tablet Take 1 tablet (4 mg total) by mouth every 8 (eight) hours as needed for nausea or vomiting. 30 tablet 1   OXYGEN Inhale 3 L into the lungs continuous.     pantoprazole (PROTONIX) 40 MG tablet Take 1 tablet by mouth twice daily 60 tablet 5   simvastatin (ZOCOR) 40 MG tablet Take 40 mg by mouth at bedtime.      spironolactone (ALDACTONE) 25 MG tablet Take 0.5 tablets (12.5 mg total) by mouth 2 (two) times daily. 30 tablet 2   XIFAXAN 550 MG TABS tablet TAKE ONE TABLET BY MOUTH TWICE A DAY 180 tablet 2    furosemide (LASIX) 20 MG tablet Take 2 tablets (40 mg total) by mouth 2 (two) times daily. (Patient not taking: Reported on 10/22/2021) 240 tablet 1   potassium chloride (KLOR-CON) 10 MEQ tablet Take 10 mEq by mouth daily. (Patient not taking: Reported on 10/22/2021)     No current facility-administered medications for this visit.  Allergies as of 11/02/2021 - Review Complete 11/02/2021  Allergen Reaction Noted   Penicillins Nausea And Vomiting 05/26/2014    Family History  Problem Relation Age of Onset   Emphysema Mother    Cerebrovascular Accident Father    Heart disease Father    Diabetes Sister    Heart disease Brother    Heart disease Sister    Heart disease Brother    Diabetes Brother     Social History   Socioeconomic History   Marital status: Married    Spouse name: Not on file   Number of children: Not on file   Years of education: Not on file   Highest education level: Not on file  Occupational History   Not on file  Tobacco Use   Smoking status: Former    Types: Cigarettes    Quit date: 05/25/1989    Years since quitting: 32.4    Passive exposure: Past   Smokeless tobacco: Former    Quit date: 01/16/1990  Vaping Use   Vaping Use: Never used  Substance and Sexual Activity   Alcohol use: No    Alcohol/week: 0.0 standard drinks of alcohol    Comment: Drank many years - 30 years ago   Drug use: No   Sexual activity: Not on file  Other Topics Concern   Not on file  Social History Narrative   Not on file   Social Determinants of Health   Financial Resource Strain: Not on file  Food Insecurity: Not on file  Transportation Needs: Not on file  Physical Activity: Not on file  Stress: Not on file  Social Connections: Not on file   Review of systems General: negative for malaise, night sweats, fever, chills, weight loss Neck: Negative for lumps, goiter, pain and significant neck swelling Resp: Negative for cough, wheezing, dyspnea at rest CV: Negative for  chest pain, leg swelling, palpitations, orthopnea GI: denies melena, hematochezia, nausea, vomiting, diarrhea, constipation, dysphagia, odyonophagia, early satiety or unintentional weight loss.  MSK: Negative for joint pain or swelling, back pain, and muscle pain. Derm: Negative for itching or rash Psych: Denies depression, anxiety, memory loss, confusion. No homicidal or suicidal ideation.  Heme: Negative for prolonged bleeding, bruising easily, and swollen nodes. Endocrine: Negative for cold or heat intolerance, polyuria, polydipsia and goiter. Neuro: negative for tremor, gait imbalance, syncope and seizures. The remainder of the review of systems is noncontributory.  Physical Exam: BP 100/68 (BP Location: Left Arm, Patient Position: Sitting, Cuff Size: Large)   Pulse (!) 101   Ht 5' 9"  (1.753 m)   Wt 180 lb (81.6 kg)   BMI 26.58 kg/m  General:   Alert and oriented. No distress noted. Pleasant and cooperative.  Head:  Normocephalic and atraumatic. Eyes:  Conjuctiva clear without scleral icterus. Mouth:  Oral mucosa pink and moist. Good dentition. No lesions. Heart: Normal rate and rhythm, s1 and s2 heart sounds present.  Lungs: Clear lung sounds in all lobes. Respirations equal and unlabored. Abdomen:  +BS, soft, non-tender and minimal fluid in abdomen. No rebound or guarding. No HSM or masses noted. Derm: No palmar erythema or jaundice Msk:  Symmetrical without gross deformities. Normal posture. Extremities:  Without edema. Neurologic:  Alert and  oriented x4. NO Asterixis  Psych:  Alert and cooperative. Normal mood and affect.  Invalid input(s): "6 MONTHS"   ASSESSMENT: Herron Fero Savarino is a 81 y.o. male presenting today for follow up of Cirrhosis.  Weight stable at 180 lbs, checking daily  weights at home. Maintained on spironolactone 44m in the morning and 12.52min the evening. Ascites appears to be very minimal today. No LE edema. Denies episodes of confusion.    PLAN:   MELD labs, INR, AFP  2. RUQ [place]. Continue lactulose 4517mID-TID, titrate for 2-3 soft BMs per day 4. Continue xifaxan 550m22mD 5. 2g sodium diet 6. Continue current spironolactone dosing  7. Continue daily weights, make us a[place] of 3 lbs gain in 1 day or 5 lbs gain in 1 week 8. Discuss anxiety/insomnia with PCP    Follow Up: 3 months  Amous Crewe L. CarlAlver SorrowN, APRN, AGNP-C Adult-Gerontology Nurse Practitioner [facility]

## 2021-11-03 ENCOUNTER — Other Ambulatory Visit: Payer: Self-pay

## 2021-11-03 DIAGNOSIS — J90 Pleural effusion, not elsewhere classified: Secondary | ICD-10-CM

## 2021-11-04 LAB — PROTIME-INR
INR: 1.2 — ABNORMAL HIGH
Prothrombin Time: 12.7 s — ABNORMAL HIGH (ref 9.0–11.5)

## 2021-11-04 LAB — COMPREHENSIVE METABOLIC PANEL
AG Ratio: 0.7 (calc) — ABNORMAL LOW (ref 1.0–2.5)
ALT: 13 U/L (ref 9–46)
AST: 16 U/L (ref 10–35)
Albumin: 2.3 g/dL — ABNORMAL LOW (ref 3.6–5.1)
Alkaline phosphatase (APISO): 166 U/L — ABNORMAL HIGH (ref 35–144)
BUN/Creatinine Ratio: 11 (calc) (ref 6–22)
BUN: 16 mg/dL (ref 7–25)
CO2: 25 mmol/L (ref 20–32)
Calcium: 8.1 mg/dL — ABNORMAL LOW (ref 8.6–10.3)
Chloride: 106 mmol/L (ref 98–110)
Creat: 1.42 mg/dL — ABNORMAL HIGH (ref 0.70–1.22)
Globulin: 3.1 g/dL (calc) (ref 1.9–3.7)
Glucose, Bld: 220 mg/dL — ABNORMAL HIGH (ref 65–99)
Potassium: 4 mmol/L (ref 3.5–5.3)
Sodium: 139 mmol/L (ref 135–146)
Total Bilirubin: 2.5 mg/dL — ABNORMAL HIGH (ref 0.2–1.2)
Total Protein: 5.4 g/dL — ABNORMAL LOW (ref 6.1–8.1)

## 2021-11-04 LAB — CBC
HCT: 35.4 % — ABNORMAL LOW (ref 38.5–50.0)
Hemoglobin: 12.2 g/dL — ABNORMAL LOW (ref 13.2–17.1)
MCH: 31.1 pg (ref 27.0–33.0)
MCHC: 34.5 g/dL (ref 32.0–36.0)
MCV: 90.3 fL (ref 80.0–100.0)
MPV: 9.4 fL (ref 7.5–12.5)
Platelets: 199 10*3/uL (ref 140–400)
RBC: 3.92 10*6/uL — ABNORMAL LOW (ref 4.20–5.80)
RDW: 15 % (ref 11.0–15.0)
WBC: 7.4 10*3/uL (ref 3.8–10.8)

## 2021-11-04 LAB — AFP TUMOR MARKER: AFP-Tumor Marker: 1 ng/mL (ref <6.1)

## 2021-11-10 DIAGNOSIS — E039 Hypothyroidism, unspecified: Secondary | ICD-10-CM | POA: Diagnosis not present

## 2021-11-10 DIAGNOSIS — E1129 Type 2 diabetes mellitus with other diabetic kidney complication: Secondary | ICD-10-CM | POA: Diagnosis not present

## 2021-11-10 DIAGNOSIS — K432 Incisional hernia without obstruction or gangrene: Secondary | ICD-10-CM | POA: Diagnosis not present

## 2021-11-10 DIAGNOSIS — E1165 Type 2 diabetes mellitus with hyperglycemia: Secondary | ICD-10-CM | POA: Diagnosis not present

## 2021-11-10 DIAGNOSIS — I129 Hypertensive chronic kidney disease with stage 1 through stage 4 chronic kidney disease, or unspecified chronic kidney disease: Secondary | ICD-10-CM | POA: Diagnosis not present

## 2021-11-10 DIAGNOSIS — D500 Iron deficiency anemia secondary to blood loss (chronic): Secondary | ICD-10-CM | POA: Diagnosis not present

## 2021-11-10 DIAGNOSIS — D649 Anemia, unspecified: Secondary | ICD-10-CM | POA: Diagnosis not present

## 2021-11-10 DIAGNOSIS — I10 Essential (primary) hypertension: Secondary | ICD-10-CM | POA: Diagnosis not present

## 2021-11-10 DIAGNOSIS — E1122 Type 2 diabetes mellitus with diabetic chronic kidney disease: Secondary | ICD-10-CM | POA: Diagnosis not present

## 2021-11-10 DIAGNOSIS — E876 Hypokalemia: Secondary | ICD-10-CM | POA: Diagnosis not present

## 2021-11-15 ENCOUNTER — Telehealth: Payer: Self-pay

## 2021-11-15 ENCOUNTER — Telehealth (INDEPENDENT_AMBULATORY_CARE_PROVIDER_SITE_OTHER): Payer: Self-pay | Admitting: *Deleted

## 2021-11-15 DIAGNOSIS — I509 Heart failure, unspecified: Secondary | ICD-10-CM | POA: Diagnosis not present

## 2021-11-15 NOTE — Telephone Encounter (Signed)
Patient's wife called to see if pt could come in earlier than is 130 lab apt for blood work since he will be at the hospital that morning.  Wife aware that they can come by earlier than 130p if needed.

## 2021-11-15 NOTE — Telephone Encounter (Signed)
Patient's wife betty left voicemail to return call   [phone].  I left message to return call with Inez Catalina.

## 2021-11-15 NOTE — Telephone Encounter (Signed)
Wife wanted to know what day and time was his appt for ultrasound and I gave her appt information.

## 2021-11-17 ENCOUNTER — Ambulatory Visit: Payer: Medicare Other | Admitting: Internal Medicine

## 2021-11-17 ENCOUNTER — Encounter: Payer: Self-pay | Admitting: Internal Medicine

## 2021-11-17 DIAGNOSIS — J90 Pleural effusion, not elsewhere classified: Secondary | ICD-10-CM | POA: Diagnosis not present

## 2021-11-17 DIAGNOSIS — J9611 Chronic respiratory failure with hypoxia: Secondary | ICD-10-CM

## 2021-11-17 NOTE — Patient Instructions (Addendum)
Fluid should be controlled by adjusting your diuretics = lasix/aldactone according to how bad the fluid is affecting your breathing, especially your ability to lie down at night >  your PCP and GI doctor will be adjusting this going forward but I agree aldactone / lasix best combination here.  If getting worse to point of breathing is difficulty trying to sleep at your present level of incline, then will need to return for repeat Thoracentesis

## 2021-11-17 NOTE — Assessment & Plan Note (Signed)
Thoracentesis 08/04/21  1250 cc transudative/  368 wbc with L > P  Atypical cells Thoracentesis 08/06/21  Repeated and discarded x 1.1 liters - recurrent  08/27/2021  > 10/29/21  Repeat T centesis = 1.9 liters >>> neg cytology   Likely combination of diastolic dysfunction and cirrhosis but should be able to control s further t cestesis by combining aldactone/lasix until renal function deteriorates and if not a candidate for TIPS then nothing else to offer and would consider palliative care/ hospice approach.  Discussed in detail all the  indications, usual  risks and alternatives  relative to the benefits with patient who agrees to proceed with Rx as outlined.      F/u in this clinic is prn

## 2021-11-17 NOTE — Assessment & Plan Note (Addendum)
02 rx as of 08/27/2021 = 3lpm 24/7   Advised to adjust to keep sats > 90%  Each maintenance medication was reviewed in detail including emphasizing most importantly the difference between maintenance and prns and under what circumstances the prns are to be triggered using an action plan format where appropriate.  Total time for H and P, chart review, counseling, reviewing 02 device(s) and generating customized AVS unique to this office visit / same day charting  > 30 min summary final f/u ov

## 2021-11-17 NOTE — Progress Notes (Signed)
Tyler Farmer, male    DOB: 05/09/1940    MRN: [id number]   Brief patient profile:  59  yowm quit smoking 1991 s apparent sequelae  referred to pulmonary clinic in [place]  08/27/2021 by Dr Denton Brick for resp failure    Admission date:  08/02/2021   Discharge Date:  08/06/2021    Discharge Diagnosis  Pleural effusion [J90] Bilateral pleural effusion [J90] Community acquired pneumonia, unspecified laterality [J18.9]     Acute diastolic CHF (congestive heart failure) ([place])   Community Acquired Pneumonia   Acute respiratory failure with hypoxia (HCC)   HTN (hypertension)   DM (diabetes mellitus) ([employer])   Liver cirrhosis secondary to NASH ([place])   Portal hypertension (HCC)   Atrial fibrillation, chronic ([place])   Large bilateral pleural effusions      Chief Complaint: Difficulty breathing.   HPI: Tyler Farmer is a 81 y.o. male with history significant for multiple medical problems including atrial fibrillation, hypertension, diabetes, [name] liver cirrhosis, COPD, GI bleed, portal vein thrombosis, hepatic encephalopathy TIPs, CHF. Patient presented to ED with complaints of difficulty breathing of 1 week duration, spouse reportrf they have been checking his weight, and its been increasing by 1 pound every day.  He called his outpatient provider, he was initially started on Lasix 40 mg daily and increased to twice daily without significant improvement in symptoms.   Patient also reports tightness in his thighs and some abdominal bloating.  He denies cough.  No chest pain.   ED Course: Temperature 97.8.  Heart rate 91-119.  Respiratory symptoms 17- 27.  O2 sats down to 88% placed on 3 L.  Blood pressure systolic ranging from 81-103.  O2 sats 91 to 98% on room air.  BNP 405.  Troponin 12 >> 11.  CTA chest negative for PE, shows large bilateral pleural effusion left greater than right, lower lobe consolidation left greater than right.  IV Lasix 40 mg x 1 given. IV Levaquin given.        Assessment and Plan:   *Acute on chronic diastolic CHF (congestive heart failure) (HCC) -Echo 03/2021, EF of 60 to 65% Respiratory status improving after thoracentesis with removal of 1.25 L of fluid from the left pleural space on 08/04/2021 -Did not attempt ReDS clip test on him due to large curvature of his thoracic spine.  -Patient required another left-sided thoracentesis with removal of 1.1 L of fluid on 08/06/2021 -Fluid protein is low however pleural fluid LDH is high--- there is concern for pneumonia which could explain this trend towards exudative fluid -Given recurrent nature of pleural fluid patient has been advised to follow-up with pulmonologist Dr. Melida Quitter further evaluation to exclude the possibility of underlying malignancy -Weight is down to 189 lb from 205 lb on admission -Treated with IV Lasix with discharge home on Lasix 60 mg qam and 20 mg daily   Acute on chronic respiratory failure with hypoxia ([place]) Likely secondary to large pleural effusions from decompensated CHF. Currently requiring 3 L of oxygen via nasal cannula Overall improving after serial thoracentesis on 08/04/2021  and again on 08/06/21 as outlined above   AKI----acute kidney injury on CKD stage -3A -Creatinine trending back down at 1.40 -Lasix adjusted as above renally adjust medications, avoid nephrotoxic agents / dehydration  / hypotension   Community Acquired Pneumonia -Clinical picture more consistent with CHF than pneumonia -Repeat chest x-ray on 08/06/2021 as patient already had thoracentesis -Continue Levaquin for now   Large bilateral pleural effusions -- Left-sided  thoracentesis as outlined above #1 -Repeat chest x-ray in 1 week and follow-up with pulmonologist advised   Atrial fibrillation, chronic ([place]) Currently RATED CONTROLLED atrial fibrillation.  -  Not on anticoagulation due to history of GI bleed.   Portal hypertension (HCC) -- this is chronic secondary to his liver disease     Liver cirrhosis secondary to NASH [facility]) Mental status intact.   reports compliance with lactulose 45 mils TID and Xifaxan Lasix and Aldactone as above #1  Continue lactulose and Xifaxan -Keep appointment with Dr. Melony Overly for 08/09/2021   DM (diabetes mellitus) ([place]) -Diet controlled    HTN (hypertension) -Continue Lasix and Aldactone and metoprolol  25-37.5 twice daily         History of Present Illness  08/27/2021  Pulmonary/ 1st office eval/ Tyler Farmer / [place] [facility]  Chief Complaint  Patient presents with   Follow-up    SOB   Dyspnea:  room to room  Cough: none / slt raspy throat Sleep: blocks under bed x 30 degrees  02:  3lpm hs/ 3lpm at rest/ 3lpm with activity Rec Change your next visit in [place] from a consult to a follow up office visit.   10/22/2021  f/u ov/[place] [facility]/Tyler Farmer re: L effusion  maint on 02   Chief Complaint  Patient presents with   Follow-up    Feels breathing has worsened since last ov.   Dyspnea:  50 ft / gets let out at door  Cough: none Sleeping: blocks under bed x 30 degrees x years  SABA use: none  02: 3lpm 24/7  Covid status: vax x 3 Rec Keep appt for your thoracentesis (removing fluid from your L chest)  - we need cytology done on the fluid    10/29/21  Repeat T centesis = 1.9 liters >>> neg cytology    11/17/2021  f/u ov/Tyler Farmer re: transudative/ recurrent L effusion / 02 dep RF  Chief Complaint  Patient presents with   Follow-up    Patients breathing has not been good since thoracentesis was done per wife   Dyspnea:  50 ft/ gets let off at door  Cough: none  Sleeping: 30 degrees blocks / pillows = baseline even p prior taps  SABA use: none  02: 4lpm 24/7      No obvious day to day or daytime variability or assoc excess/ purulent sputum or mucus plugs or hemoptysis or cp or chest tightness, subjective wheeze or overt sinus or hb symptoms.   Sleeping  without nocturnal  or early am exacerbation  of respiratory   c/o's or need for noct saba. Also denies any obvious fluctuation of symptoms with weather or environmental changes or other aggravating or alleviating factors except as outlined above   No unusual exposure hx or h/o childhood pna/ asthma or knowledge of premature birth.  Current Allergies, Complete Past Medical History, Past Surgical History, Family History, and Social History were reviewed in [employer] record.  ROS  The following are not active complaints unless bolded Hoarseness, sore throat, dysphagia, dental problems, itching, sneezing,  nasal congestion or discharge of excess mucus or purulent secretions, ear ache,   fever, chills, sweats, unintended wt loss or wt gain, classically pleuritic or exertional cp,  orthopnea pnd or arm/hand swelling  or leg swelling, presyncope, palpitations, abdominal pain, anorexia, nausea, vomiting, diarrhea  or change in bowel habits or change in bladder habits, change in stools or change in urine, dysuria, hematuria,  rash, arthralgias, visual complaints, headache, numbness,  weakness or ataxia or problems with walking or coordination,  change in mood or  memory.        Current Meds  Medication Sig   acetaminophen (TYLENOL) 500 MG tablet Take 500 mg by mouth every 6 (six) hours as needed for mild pain.   aspirin EC 81 MG tablet Take 1 tablet (81 mg total) by mouth daily with breakfast. Swallow whole.   Cranberry 500 MG TABS Take 500 mg by mouth daily.   ferrous sulfate 325 (65 FE) MG tablet Take 325 mg by mouth daily with breakfast.   glipiZIDE (GLUCOTROL) 10 MG tablet Take 10 mg by mouth 2 (two) times daily before a meal.   guaiFENesin (MUCINEX) 600 MG 12 hr tablet Take 600 mg by mouth 2 (two) times daily as needed for cough or to loosen phlegm.   lactulose (CHRONULAC) 10 GM/15ML solution Take 45 mLs (30 g total) by mouth 3 (three) times daily.   levothyroxine (SYNTHROID) 137 MCG tablet Take 137 mcg by mouth daily before breakfast.    magnesium oxide (MAG-OX) 400 MG tablet Take 400 mg by mouth daily.   MELATONIN PO Take by mouth. 5m takes 2 at night   metoCLOPramide (REGLAN) 5 MG tablet Take 1 tablet (5 mg total) by mouth 3 (three) times daily before meals.   metoprolol tartrate (LOPRESSOR) 25 MG tablet TAKE 1 & 1/2 (ONE & ONE-HALF) TABLETS BY MOUTH IN THE MORNING AND 1 IN THE EVENING   ondansetron (ZOFRAN) 4 MG tablet Take 1 tablet (4 mg total) by mouth every 8 (eight) hours as needed for nausea or vomiting.   OXYGEN Inhale 3 L into the lungs continuous.   pantoprazole (PROTONIX) 40 MG tablet Take 1 tablet by mouth twice daily   potassium chloride (KLOR-CON) 10 MEQ tablet Take 10 mEq by mouth daily.   simvastatin (ZOCOR) 40 MG tablet Take 40 mg by mouth at bedtime.    spironolactone (ALDACTONE) 25 MG tablet Take 0.5 tablets (12.5 mg total) by mouth 2 (two) times daily.   XIFAXAN 550 MG TABS tablet TAKE ONE TABLET BY MOUTH TWICE A DAY                 Past Medical History:  Diagnosis Date   A-fib ([place]    AKI (acute kidney injury) ([place] 01/27/2021   Arthritis    Hands/Fingers   Asthma    Back pain    Cirrhosis of liver (HCC)    Diabetes mellitus ([facility]    Enlarged prostate    Gastric ulcer    GERD (gastroesophageal reflux disease)    H/O bladder problems    History of kidney stones    Hypercholesteremia    Hypertension    [place]state cancer ([place]    Thyroid disease    UGI bleed 10/26/2016   Vitamin B12 deficiency 10/26/2016       Objective:    Wts    11/17/2021       177   10/22/21 184 lb 12.8 oz (83.8 kg)  09/07/21 180 lb 12.8 oz (82 kg)  08/27/21 185 lb 3.2 oz (84 kg)    Vital signs reviewed  11/17/2021  - Note at rest 02 sats  93% on 3lpm    General appearance:    gaunt w/c bound wm nad     HEENT : Oropharynx  clear     Nasal turbinates nl    NECK :  without  apparent JVD/ palpable Nodes/TM    LUNGS: no acc  muscle use,  Nl contour chest with decreased bs/ dullness 2/3 on L   CV:  RRR   no s3 or murmur or increase in P2, and no edema   ABD:  soft and nontender with nl inspiratory excursion in the supine position. No bruits or organomegaly appreciated   MS:  Nl gait/ ext warm without deformities Or obvious joint restrictions  calf tenderness, cyanosis or clubbing    SKIN: warm and dry without lesions    NEURO:  alert, approp, nl sensorium with  no motor or cerebellar deficits apparent.     Lab Results  Component Value Date   CREATININE 1.42 (H) 11/02/2021   CREATININE 1.78 (H) 09/16/2021   CREATININE 1.92 (H) 09/06/2021       Assessment

## 2021-11-18 ENCOUNTER — Telehealth (INDEPENDENT_AMBULATORY_CARE_PROVIDER_SITE_OTHER): Payer: Self-pay | Admitting: *Deleted

## 2021-11-18 ENCOUNTER — Ambulatory Visit (HOSPITAL_COMMUNITY)
Admission: RE | Admit: 2021-11-18 | Discharge: 2021-11-18 | Disposition: A | Discharge status: Home / Self Care | Payer: Medicare Other | Source: Ambulatory Visit | Attending: Gastroenterology | Admitting: Gastroenterology

## 2021-11-18 ENCOUNTER — Other Ambulatory Visit: Payer: Medicare Other

## 2021-11-18 DIAGNOSIS — K7581 Nonalcoholic steatohepatitis (NASH): Secondary | ICD-10-CM | POA: Insufficient documentation

## 2021-11-18 DIAGNOSIS — K7460 Unspecified cirrhosis of liver: Secondary | ICD-10-CM | POA: Diagnosis not present

## 2021-11-18 DIAGNOSIS — K8020 Calculus of gallbladder without cholecystitis without obstruction: Secondary | ICD-10-CM | POA: Diagnosis not present

## 2021-11-18 DIAGNOSIS — J90 Pleural effusion, not elsewhere classified: Secondary | ICD-10-CM | POA: Diagnosis not present

## 2021-11-18 DIAGNOSIS — N281 Cyst of kidney, acquired: Secondary | ICD-10-CM | POA: Diagnosis not present

## 2021-11-18 NOTE — Telephone Encounter (Signed)
Patient's wife Inez Catalina called to see when she could get results of ultrasound done today. Reports he saw Dr. Melvyn Novas yesterday. His weight yesterday 177 lbs and he is taking spironolactone different than on his med list. Taking 32m one whole tablet in the mornings and one half in the evenings. And not taking any lasix.

## 2021-11-19 DIAGNOSIS — H6122 Impacted cerumen, left ear: Secondary | ICD-10-CM | POA: Diagnosis not present

## 2021-11-19 LAB — PSA: Prostate Specific Ag, Serum: 0.1 ng/mL (ref 0.0–4.0)

## 2021-11-19 NOTE — Telephone Encounter (Signed)
Discussed with patient's wife Inez Catalina. She wanted me to check with you because she reports she was told he could not do MRI until metal was removed that Dr. Laural Golden put in back in March of this year and she cant remember if it was removed or not

## 2021-11-24 ENCOUNTER — Telehealth: Payer: Self-pay

## 2021-11-24 NOTE — Telephone Encounter (Signed)
Wife aware of results and apt canceled.

## 2021-11-24 NOTE — Telephone Encounter (Signed)
Wife called to let you know hospice has taken over patient's care.  She is asking if she needs to keep upcoming apt with you on 09/07 since he is under hospice care or will you call with lab results.  Please advise.

## 2021-11-24 NOTE — Telephone Encounter (Signed)
Discussed with pt's wife Inez Catalina and she wanted to let you know he was placed on hospice yesterday.

## 2021-11-25 ENCOUNTER — Ambulatory Visit: Payer: Medicare Other | Admitting: Urology

## 2021-12-13 ENCOUNTER — Ambulatory Visit: Payer: Medicare Other | Admitting: Cardiology

## 2021-12-19 DEATH — deceased

## 2021-12-29 ENCOUNTER — Telehealth (INDEPENDENT_AMBULATORY_CARE_PROVIDER_SITE_OTHER): Payer: Self-pay | Admitting: *Deleted

## 2021-12-29 NOTE — Telephone Encounter (Signed)
Patients wife called and reported he got a shipment of xifaxan in today in the mail. I called pharmacy [phone] and let them know patient was deceased. Wife reports he passed away on 12/31/2021. I asked if she should return med and was told no they could not take back med and I called wife to let her know they have canceled refills and she should discard the pills she received.

## 2022-01-14 ENCOUNTER — Ambulatory Visit: Payer: Medicare Other | Admitting: Internal Medicine

## 2022-02-01 ENCOUNTER — Ambulatory Visit (INDEPENDENT_AMBULATORY_CARE_PROVIDER_SITE_OTHER): Payer: Medicare Other | Admitting: Gastroenterology

## 2022-11-12 IMAGING — US US THORACENTESIS ASP PLEURAL SPACE W/IMG GUIDE
1 series · 1 of 1 positions shown · non-contrast
Comparison: none

INDICATION: Bilateral pleural effusions, left greater than right.

[Series 1: us thoracentesis asp pleural space w/img guide · 1 of 1 slices shown]
[im 1/1]
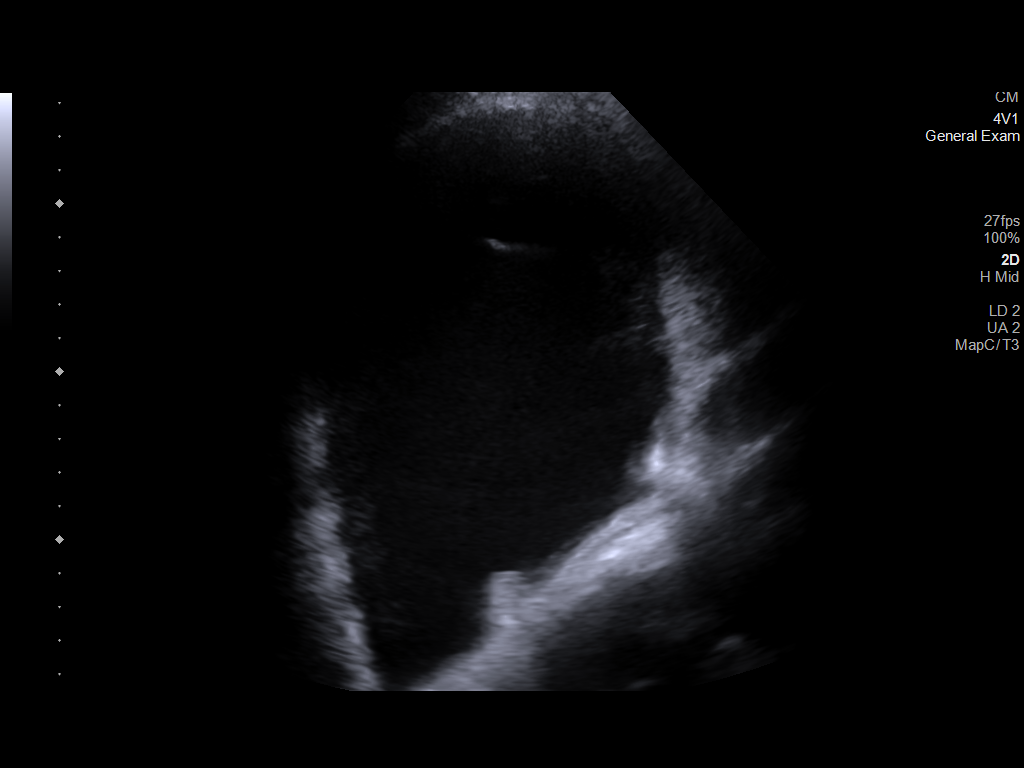

[1 of 1 positions shown; findings below may reference images not displayed]

EXAM:
ULTRASOUND GUIDED LEFT THORACENTESIS

MEDICATIONS:
None.

COMPLICATIONS:
None immediate.

PROCEDURE:
An ultrasound guided thoracentesis was thoroughly discussed with the
patient and questions answered. The benefits, risks (including
hemorrhage, infection, and pneumothorax, among others), alternatives
and complications were also discussed. The patient understands and
wishes to proceed with the procedure. Written consent was obtained.

Standard time-out was performed. Ultrasound was performed to
localize and mark an adequate pocket of fluid in the left chest. The
area was then prepped and draped in the normal sterile fashion. 1%
Lidocaine was used for local anesthesia. Under ultrasound guidance a
Safe-T-Centesis type catheter was introduced. Thoracentesis was
performed. The catheter was removed and a dressing applied.
Postprocedural expiratory radiograph demonstrated no pneumothorax.
FINDINGS: A total of approximately 0343 of serosanguineous fluid was removed.
Samples were sent to the laboratory as requested by the clinical
team.
IMPRESSION: Successful ultrasound guided left thoracentesis yielding 0343 of
pleural fluid.

## 2022-11-14 IMAGING — DX DG CHEST 2V
2 series · 2 of 2 positions shown · non-contrast
Comparison: Chest x-ray dated August 04, 2021.

CLINICAL DATA: Cough and shortness of breath. Recent left-sided
thoracentesis.

EXAM:
CHEST - 2 VIEW

[chest pa]
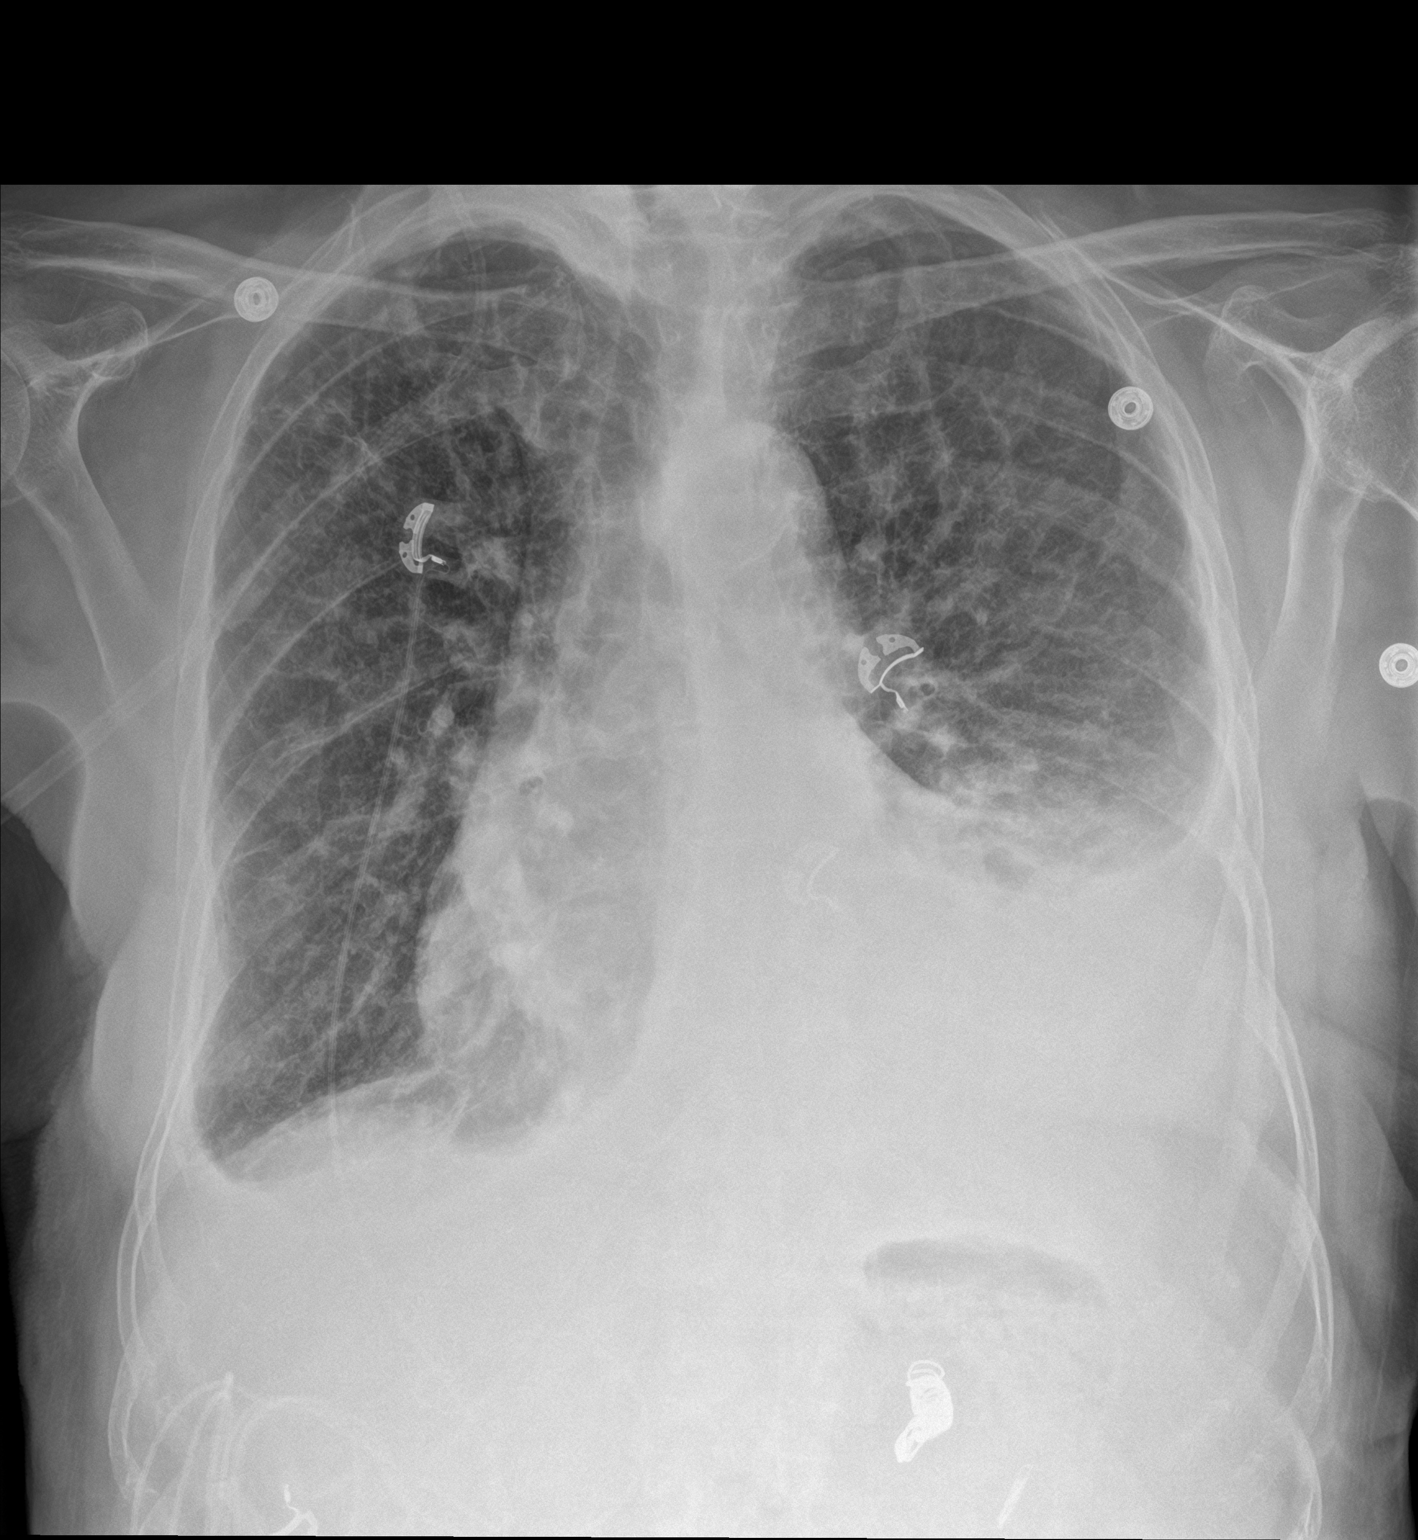

[chest lat]
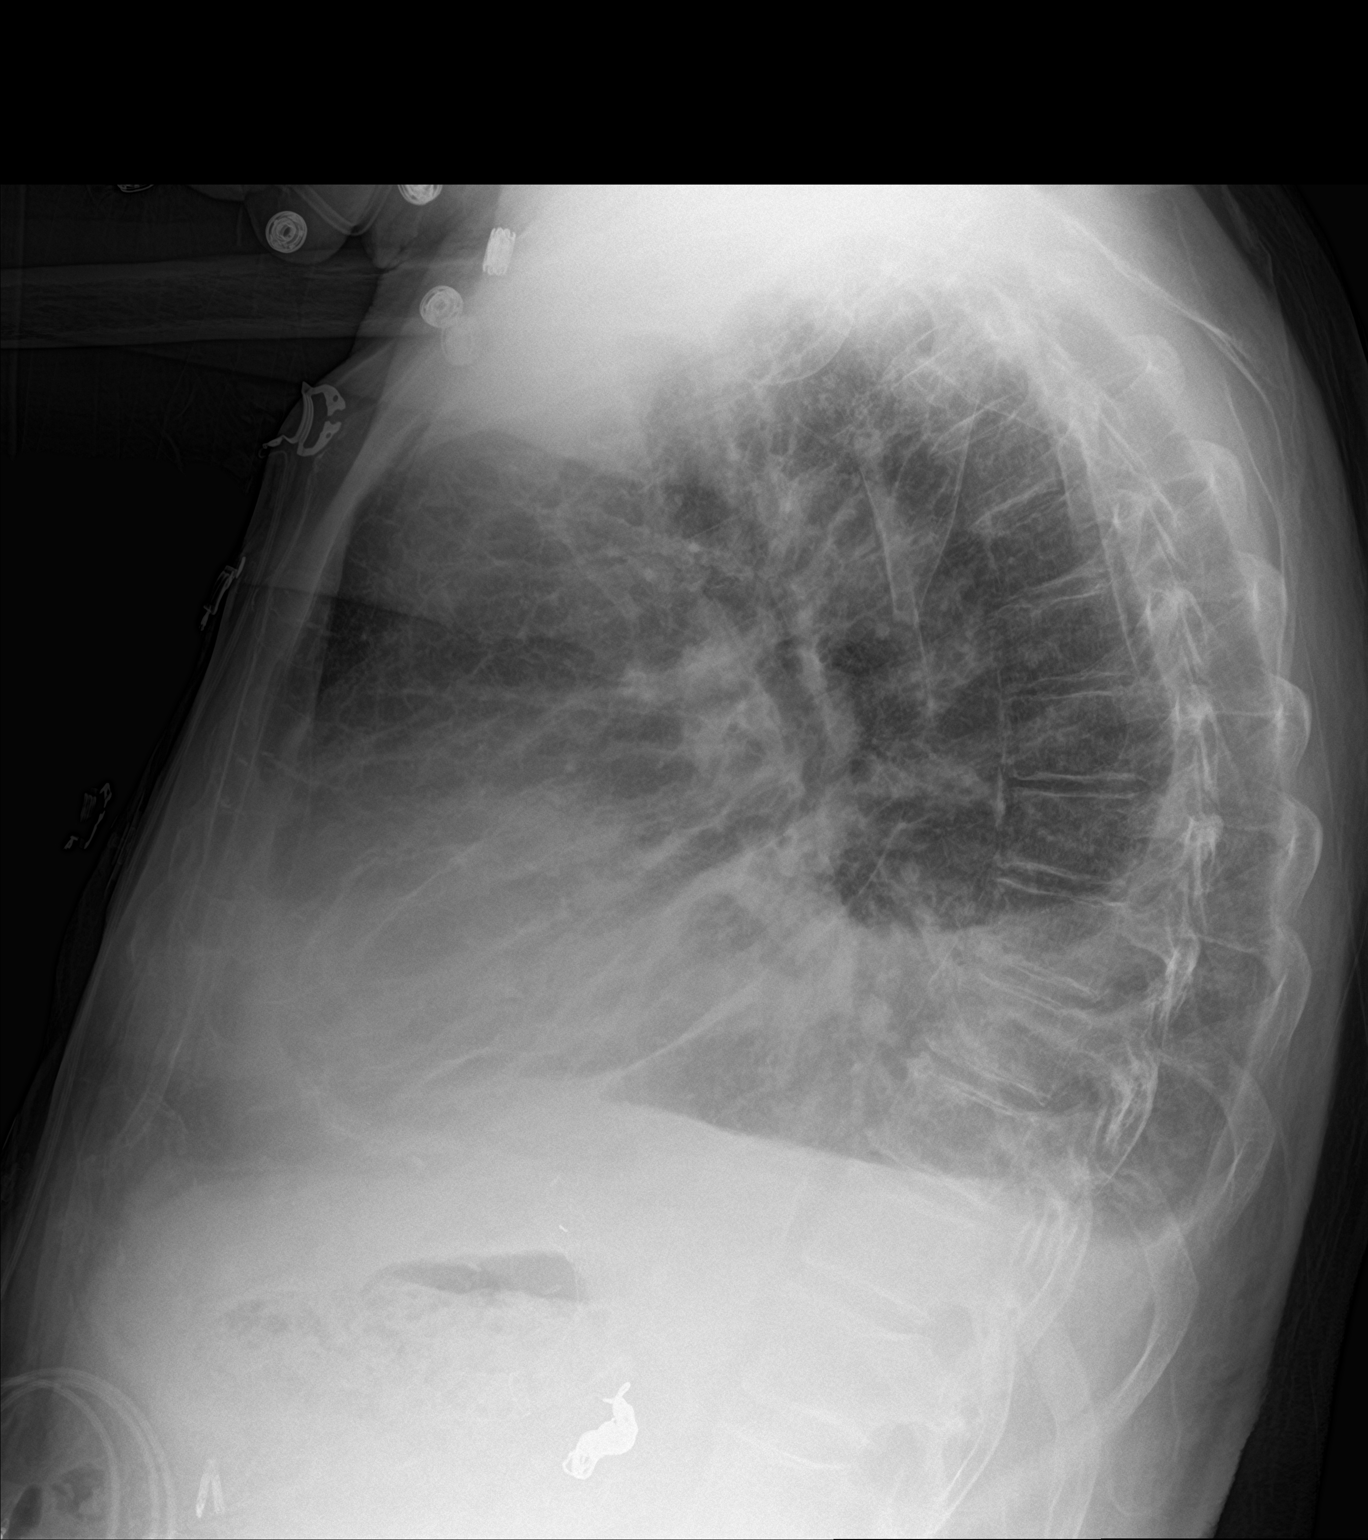

[2 of 2 positions shown; findings below may reference images not displayed]

FINDINGS: Unchanged mild cardiomegaly, pulmonary vascular congestion, and
diffuse interstitial thickening. Increasing now moderate left
pleural effusion with worsening aeration at the left lung base.
Unchanged trace right pleural effusion. No pneumothorax. No acute
osseous abnormality.
IMPRESSION: 1. Increasing now moderate left pleural effusion with worsening
aeration at the left lung base. Unchanged trace right pleural
effusion.

## 2022-11-14 IMAGING — US US THORACENTESIS ASP PLEURAL SPACE W/IMG GUIDE
1 series · 2 of 2 positions shown · non-contrast
Comparison: none

INDICATION: Left-sided pleural fluid

[Series 1: us thoracentesis asp pleural space w/img guide · 2 of 2 slices shown]
[im 1/2]
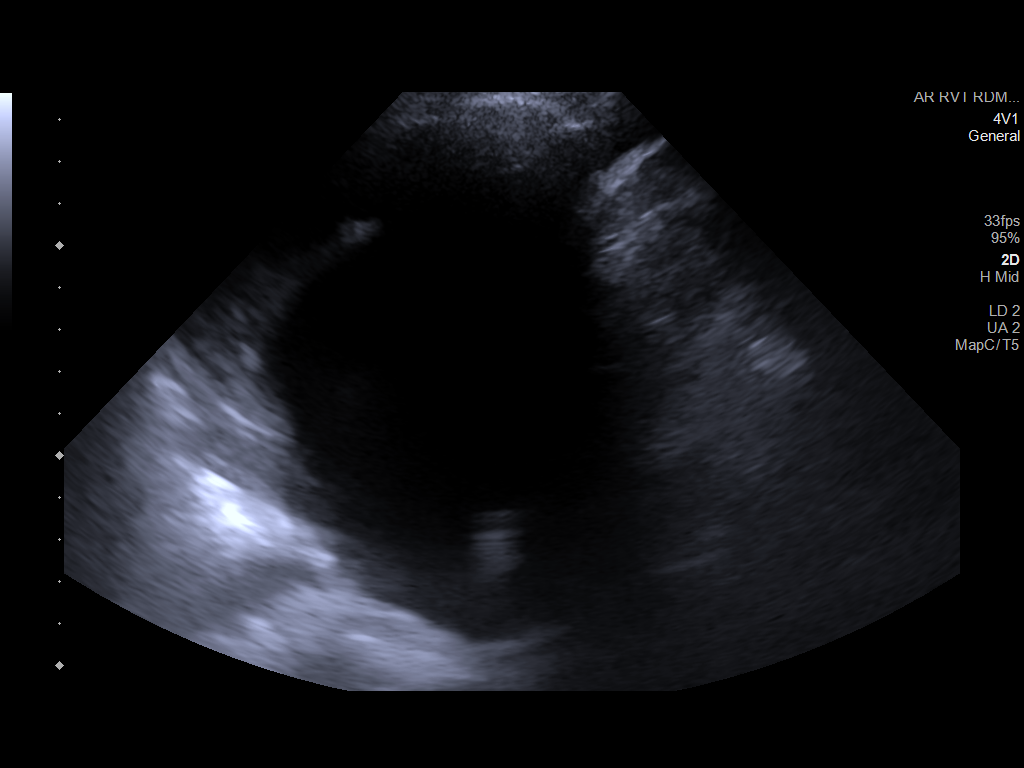
[im 2/2]
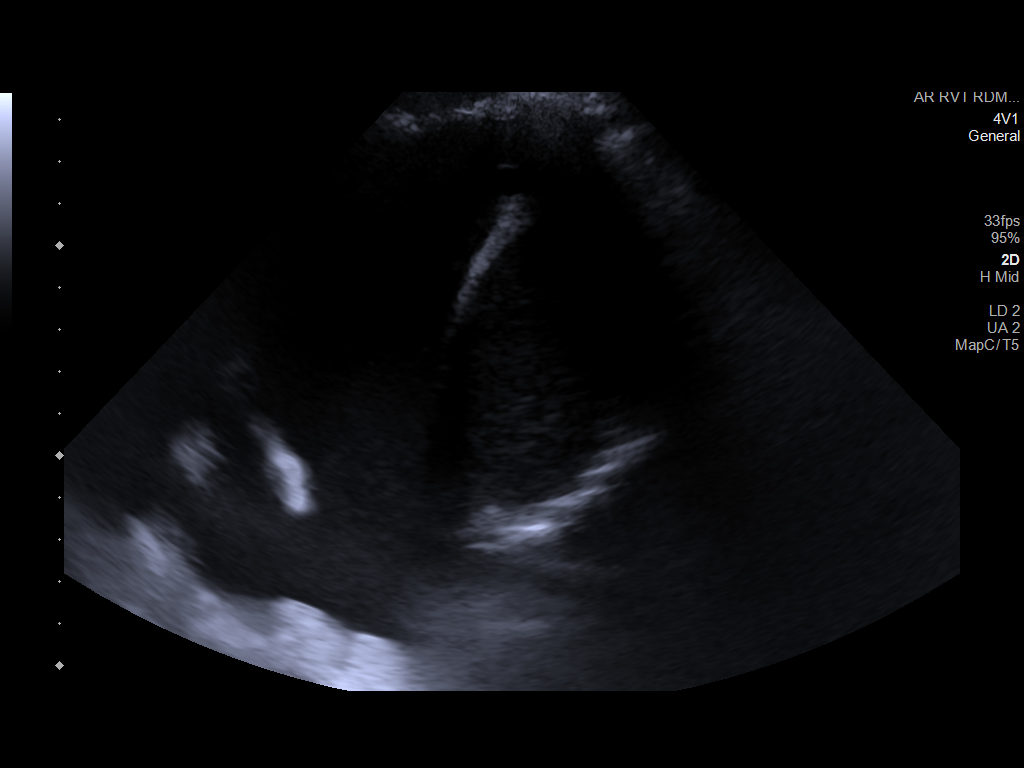

[2 of 2 positions shown; findings below may reference images not displayed]

EXAM:
ULTRASOUND GUIDED choose 1 THORACENTESIS

MEDICATIONS:
None.

COMPLICATIONS:
None immediate.

PROCEDURE:
An ultrasound guided thoracentesis was thoroughly discussed with the
patient and questions answered. The benefits, risks, alternatives
and complications were also discussed. The patient understands and
wishes to proceed with the procedure. Written consent was obtained.

Ultrasound was performed to localize and mark an adequate pocket of
fluid in the left chest. The area was then prepped and draped in the
normal sterile fashion. 1% Lidocaine was used for local anesthesia.
Under ultrasound guidance a 19 gauge, 7-cm, Yueh catheter was
introduced. Thoracentesis was performed. The catheter was removed
and a dressing applied.
FINDINGS: A total of approximately 1.1 L of serous, minimally sanguinous fluid
was removed.
IMPRESSION: Successful ultrasound guided left thoracentesis yielding 1.1 L of
pleural fluid.

## 2022-11-14 IMAGING — DX DG CHEST 1V
1 series · 1 of 1 positions shown · non-contrast
Comparison: 08/06/2021 at [DATE] a.m.

CLINICAL DATA: Pleural effusions, status post left thoracentesis.

EXAM:
CHEST  1 VIEW

[chest ap]
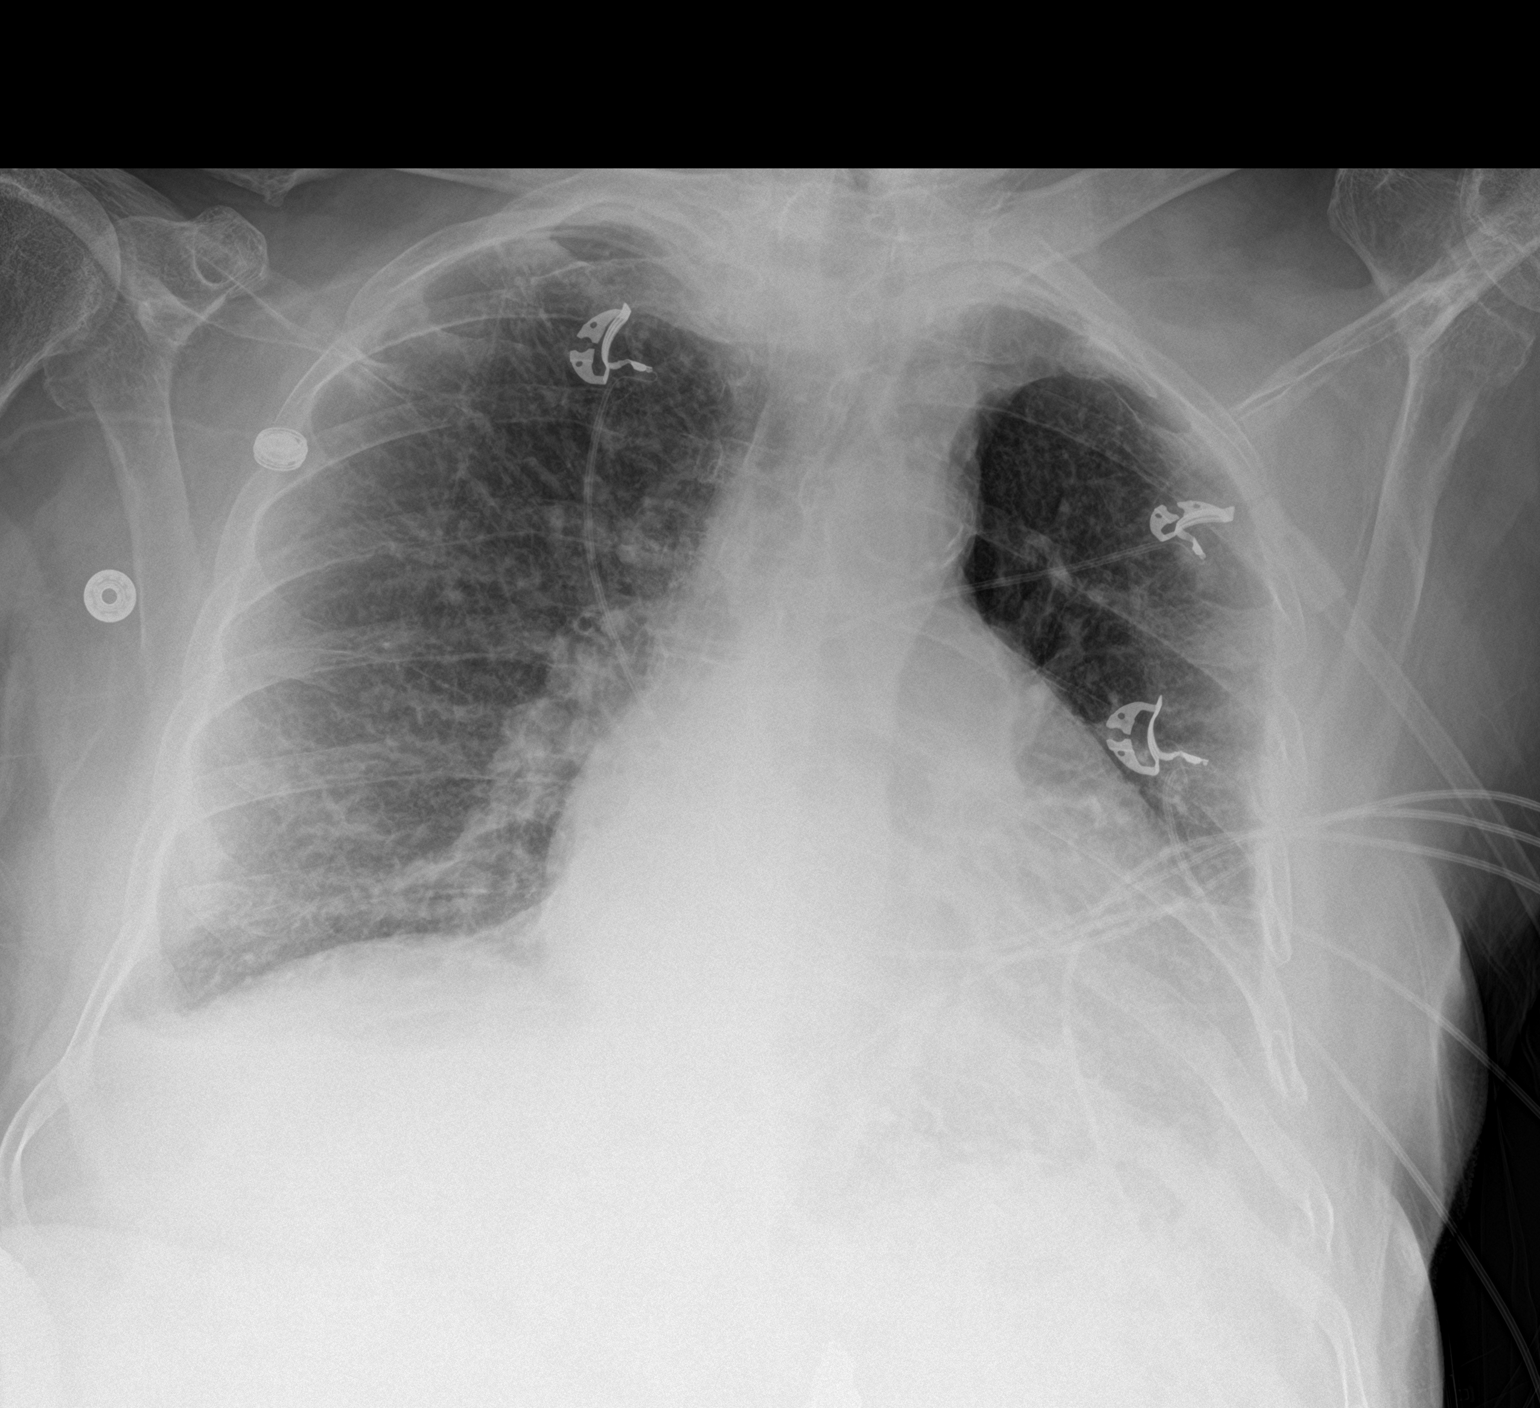

[1 of 1 positions shown; findings below may reference images not displayed]

FINDINGS: Substantial reduction in the left pleural effusion compared to the
earlier exam. Small edge over the left lung apex is attributed to
the second rib on this apical lordotic projection, no definite
pneumothorax.

Atherosclerotic calcification of the aortic arch. Mild enlargement
of the cardiopericardial silhouette. Stable mild interstitial
accentuation. Minimal blunting of the right lateral costophrenic
angle.
IMPRESSION: 1. Substantially reduced left pleural effusion status post
thoracentesis. No definite pneumothorax.
2. Mild enlargement of the cardiopericardial silhouette with mild
interstitial accentuation.
3. Small right pleural effusion.
